# Patient Record
Sex: Male | Born: 1955 | Race: Black or African American | Hispanic: No | Smoking: Former smoker
Health system: Southern US, Community
[De-identification: ages and names within clinical notes are randomized; demographics above are authoritative.]

## PROBLEM LIST (undated history)

## (undated) DIAGNOSIS — K219 Gastro-esophageal reflux disease without esophagitis: Secondary | ICD-10-CM

## (undated) DIAGNOSIS — I10 Essential (primary) hypertension: Secondary | ICD-10-CM

## (undated) DIAGNOSIS — F32A Depression, unspecified: Secondary | ICD-10-CM

## (undated) DIAGNOSIS — M1990 Unspecified osteoarthritis, unspecified site: Secondary | ICD-10-CM

## (undated) DIAGNOSIS — E119 Type 2 diabetes mellitus without complications: Secondary | ICD-10-CM

## (undated) DIAGNOSIS — I351 Nonrheumatic aortic (valve) insufficiency: Secondary | ICD-10-CM

## (undated) DIAGNOSIS — G4730 Sleep apnea, unspecified: Secondary | ICD-10-CM

## (undated) DIAGNOSIS — I719 Aortic aneurysm of unspecified site, without rupture: Secondary | ICD-10-CM

## (undated) DIAGNOSIS — F419 Anxiety disorder, unspecified: Secondary | ICD-10-CM

## (undated) DIAGNOSIS — B1920 Unspecified viral hepatitis C without hepatic coma: Secondary | ICD-10-CM

## (undated) DIAGNOSIS — I2510 Atherosclerotic heart disease of native coronary artery without angina pectoris: Secondary | ICD-10-CM

## (undated) DIAGNOSIS — G5600 Carpal tunnel syndrome, unspecified upper limb: Secondary | ICD-10-CM

---

## 2010-10-17 ENCOUNTER — Emergency Department (HOSPITAL_COMMUNITY)
Admission: EM | Admit: 2010-10-17 | Discharge: 2010-10-17 | Payer: Self-pay | Source: Home / Self Care | Admitting: Emergency Medicine

## 2010-11-01 ENCOUNTER — Emergency Department (HOSPITAL_COMMUNITY)
Admission: EM | Admit: 2010-11-01 | Discharge: 2010-11-01 | Disposition: A | Discharge status: Home / Self Care | Payer: Self-pay | Attending: Emergency Medicine | Admitting: Emergency Medicine

## 2010-11-01 ENCOUNTER — Emergency Department (HOSPITAL_COMMUNITY): Payer: Self-pay

## 2010-11-01 DIAGNOSIS — I10 Essential (primary) hypertension: Secondary | ICD-10-CM | POA: Insufficient documentation

## 2010-11-01 DIAGNOSIS — M51379 Other intervertebral disc degeneration, lumbosacral region without mention of lumbar back pain or lower extremity pain: Secondary | ICD-10-CM | POA: Insufficient documentation

## 2010-11-01 DIAGNOSIS — M5430 Sciatica, unspecified side: Secondary | ICD-10-CM | POA: Insufficient documentation

## 2010-11-01 DIAGNOSIS — M545 Low back pain, unspecified: Secondary | ICD-10-CM | POA: Insufficient documentation

## 2010-11-01 DIAGNOSIS — R209 Unspecified disturbances of skin sensation: Secondary | ICD-10-CM | POA: Insufficient documentation

## 2010-11-01 DIAGNOSIS — M5137 Other intervertebral disc degeneration, lumbosacral region: Secondary | ICD-10-CM | POA: Insufficient documentation

## 2010-11-01 DIAGNOSIS — G8929 Other chronic pain: Secondary | ICD-10-CM | POA: Insufficient documentation

## 2011-01-10 ENCOUNTER — Emergency Department (HOSPITAL_COMMUNITY): Payer: Self-pay

## 2011-01-10 ENCOUNTER — Emergency Department (HOSPITAL_COMMUNITY)
Admission: EM | Admit: 2011-01-10 | Discharge: 2011-01-10 | Disposition: A | Discharge status: Home / Self Care | Payer: Self-pay | Attending: Emergency Medicine | Admitting: Emergency Medicine

## 2011-01-10 DIAGNOSIS — F411 Generalized anxiety disorder: Secondary | ICD-10-CM | POA: Insufficient documentation

## 2011-01-10 DIAGNOSIS — R110 Nausea: Secondary | ICD-10-CM | POA: Insufficient documentation

## 2011-01-10 LAB — POCT CARDIAC MARKERS
CKMB, poc: <1 ng/mL — ABNORMAL LOW (ref 1.0–8.0)
Troponin i, poc: <0.05 ng/mL (ref 0.00–0.09)

## 2011-01-10 LAB — POCT I-STAT, CHEM 8
Chloride: 103 mEq/L (ref 96–112)
HCT: 52 % (ref 39.0–52.0)
Potassium: 4.5 mEq/L (ref 3.5–5.1)
Sodium: 137 mEq/L (ref 135–145)

## 2011-11-30 ENCOUNTER — Emergency Department (HOSPITAL_COMMUNITY): Payer: Self-pay

## 2011-11-30 ENCOUNTER — Other Ambulatory Visit: Payer: Self-pay

## 2011-11-30 ENCOUNTER — Emergency Department (HOSPITAL_COMMUNITY)
Admission: EM | Admit: 2011-11-30 | Discharge: 2011-11-30 | Disposition: A | Discharge status: Home / Self Care | Payer: Self-pay | Attending: Emergency Medicine | Admitting: Emergency Medicine

## 2011-11-30 ENCOUNTER — Encounter (HOSPITAL_COMMUNITY): Payer: Self-pay | Admitting: *Deleted

## 2011-11-30 DIAGNOSIS — R062 Wheezing: Secondary | ICD-10-CM | POA: Insufficient documentation

## 2011-11-30 DIAGNOSIS — G4730 Sleep apnea, unspecified: Secondary | ICD-10-CM | POA: Insufficient documentation

## 2011-11-30 DIAGNOSIS — R1084 Generalized abdominal pain: Secondary | ICD-10-CM | POA: Insufficient documentation

## 2011-11-30 DIAGNOSIS — R319 Hematuria, unspecified: Secondary | ICD-10-CM | POA: Insufficient documentation

## 2011-11-30 DIAGNOSIS — R05 Cough: Secondary | ICD-10-CM | POA: Insufficient documentation

## 2011-11-30 DIAGNOSIS — I10 Essential (primary) hypertension: Secondary | ICD-10-CM | POA: Insufficient documentation

## 2011-11-30 DIAGNOSIS — F17200 Nicotine dependence, unspecified, uncomplicated: Secondary | ICD-10-CM | POA: Insufficient documentation

## 2011-11-30 DIAGNOSIS — H9319 Tinnitus, unspecified ear: Secondary | ICD-10-CM | POA: Insufficient documentation

## 2011-11-30 DIAGNOSIS — R059 Cough, unspecified: Secondary | ICD-10-CM | POA: Insufficient documentation

## 2011-11-30 DIAGNOSIS — R300 Dysuria: Secondary | ICD-10-CM | POA: Insufficient documentation

## 2011-11-30 DIAGNOSIS — R110 Nausea: Secondary | ICD-10-CM | POA: Insufficient documentation

## 2011-11-30 DIAGNOSIS — R42 Dizziness and giddiness: Secondary | ICD-10-CM | POA: Insufficient documentation

## 2011-11-30 DIAGNOSIS — K6289 Other specified diseases of anus and rectum: Secondary | ICD-10-CM | POA: Insufficient documentation

## 2011-11-30 DIAGNOSIS — K5900 Constipation, unspecified: Secondary | ICD-10-CM | POA: Insufficient documentation

## 2011-11-30 DIAGNOSIS — R0789 Other chest pain: Secondary | ICD-10-CM | POA: Insufficient documentation

## 2011-11-30 HISTORY — DX: Carpal tunnel syndrome, unspecified upper limb: G56.00

## 2011-11-30 HISTORY — DX: Sleep apnea, unspecified: G47.30

## 2011-11-30 LAB — COMPREHENSIVE METABOLIC PANEL
ALT: 48 U/L (ref 0–53)
Alkaline Phosphatase: 71 U/L (ref 39–117)
BUN: 13 mg/dL (ref 6–23)
CO2: 25 mEq/L (ref 19–32)
Calcium: 9.9 mg/dL (ref 8.4–10.5)
GFR calc Af Amer: >90 mL/min (ref >90)
GFR calc non Af Amer: >90 mL/min (ref >90)
Glucose, Bld: 103 mg/dL — ABNORMAL HIGH (ref 70–99)
Potassium: 4.1 mEq/L (ref 3.5–5.1)
Sodium: 137 mEq/L (ref 135–145)

## 2011-11-30 LAB — DIFFERENTIAL
Basophils Absolute: 0 10*3/uL (ref 0.0–0.1)
Basophils Relative: 0 % (ref 0–1)
Eosinophils Absolute: 0.1 10*3/uL (ref 0.0–0.7)
Monocytes Absolute: 0.8 10*3/uL (ref 0.1–1.0)
Neutro Abs: 3.1 10*3/uL (ref 1.7–7.7)
Neutrophils Relative %: 45 % (ref 43–77)

## 2011-11-30 LAB — URINALYSIS, ROUTINE W REFLEX MICROSCOPIC
Bilirubin Urine: NEGATIVE
Hgb urine dipstick: NEGATIVE
Protein, ur: NEGATIVE mg/dL
Urobilinogen, UA: 1 mg/dL (ref 0.0–1.0)

## 2011-11-30 LAB — CBC
HCT: 44.2 % (ref 39.0–52.0)
MCH: 31.7 pg (ref 26.0–34.0)
MCHC: 34.4 g/dL (ref 30.0–36.0)
RDW: 15.1 % (ref 11.5–15.5)

## 2011-11-30 LAB — D-DIMER, QUANTITATIVE: D-Dimer, Quant: <0.22 ug/mL-FEU (ref 0.00–0.48)

## 2011-11-30 MED ORDER — SODIUM CHLORIDE 0.9 % IV BOLUS (SEPSIS)
1000.0000 mL | Freq: Once | INTRAVENOUS | Status: AC
  Administered 2011-11-30: 1000 mL via INTRAVENOUS

## 2011-11-30 MED ORDER — POLYETHYLENE GLYCOL 3350 17 GM/SCOOP PO POWD: 17.0000 g | Freq: Every day | ORAL | Status: AC

## 2011-11-30 MED ORDER — SODIUM CHLORIDE 0.9 % IV SOLN
Freq: Once | INTRAVENOUS | Status: AC
  Administered 2011-11-30: 20:00:00 via INTRAVENOUS

## 2011-11-30 NOTE — ED Notes (Signed)
Pt alert & oriented x4, stable gait. Pt given discharge instructions, paperwork & prescription(s). Patient instructed to stop at the registration desk to finish any additional paperwork. pt verbalized understanding. Pt left department w/ no further questions.  

## 2011-11-30 NOTE — ED Notes (Signed)
Patient informed that all test results are back & awaiting the EDP to review the chart. No needs voiced at this time time. NAD noted.  

## 2011-11-30 NOTE — Discharge Instructions (Signed)
Chest Pain (Nonspecific) Chest pain has many causes. Your pain could be caused by something serious, such as a heart attack or a blood clot in the lungs. It could also be caused by something less serious, such as a chest bruise or a virus. Follow up with your doctor. More lab tests or other studies may be needed to find the cause of your pain. Most of the time, nonspecific chest pain will improve within 2 to 3 days of rest and mild pain medicine. HOME CARE  For chest bruises, you may put ice on the sore area for 15 to 20 minutes, 3 to 4 times a day. Do this only if it makes you or your child feel better.   Put ice in a plastic bag.   Place a towel between the skin and the bag.   Rest for the next 2 to 3 days.   Go back to work if the pain improves.   See your doctor if the pain lasts longer than 1 to 2 weeks.   Only take medicine as told by your doctor.   Quit smoking if you smoke.  GET HELP RIGHT AWAY IF:   There is more pain or pain that spreads to the arm, neck, jaw, back, or belly (abdomen).   You or your child has shortness of breath.   You or your child coughs more than usual or coughs up blood.   You or your child has very bad back or belly pain, feels sick to his or her stomach (nauseous), or throws up (vomits).   You or your child has very bad weakness.   You or your child passes out (faints).   You or your child has a temperature by mouth above 102 F (38.9 C), not controlled by medicine.  Any of these problems may be serious and may be an emergency. Do not wait to see if the problems will go away. Get medical help right away. Call your local emergency services 911 in [place] Do not drive yourself to the hospital. MAKE SURE YOU:   Understand these instructions.   Will watch this condition.   Will get help right away if you or your child is not doing well or gets worse.  Document Released: 02/29/2008 Document Revised: 09/01/2011 Document Reviewed:  02/29/2008 [facility] Patient Information 2012 [place], [place].Constipation in Adults Constipation is having fewer than 2 bowel movements per week. Usually, the stools are hard. As we grow older, constipation is more common. If you try to fix constipation with laxatives, the problem may get worse. This is because laxatives taken over a long period of time make the colon muscles weaker. A low-fiber diet, not taking in enough fluids, and taking some medicines may make these problems worse. MEDICATIONS THAT MAY CAUSE CONSTIPATION  Water pills (diuretics).   Calcium channel blockers (used to control blood pressure and for the heart).   Certain pain medicines (narcotics).   Anticholinergics.   Anti-inflammatory agents.   Antacids that contain aluminum.  DISEASES THAT CONTRIBUTE TO CONSTIPATION  Diabetes.   Parkinson's disease.   Dementia.   Stroke.   Depression.   Illnesses that cause problems with salt and water metabolism.  HOME CARE INSTRUCTIONS   Constipation is usually best cared for without medicines. Increasing dietary fiber and eating more fruits and vegetables is the best way to manage constipation.   Slowly increase fiber intake to 25 to 38 grams per day. Whole grains, fruits, vegetables, and legumes are good sources of fiber.  A dietitian can further help you incorporate high-fiber foods into your diet.   Drink enough water and fluids to keep your urine clear or pale yellow.   A fiber supplement may be added to your diet if you cannot get enough fiber from foods.   Increasing your activities also helps improve regularity.   Suppositories, as suggested by your caregiver, will also help. If you are using antacids, such as aluminum or calcium containing products, it will be helpful to switch to products containing magnesium if your caregiver says it is okay.   If you have been given a liquid injection (enema) today, this is only a temporary measure. It should not be relied  on for treatment of longstanding (chronic) constipation.   Stronger measures, such as magnesium sulfate, should be avoided if possible. This may cause uncontrollable diarrhea. Using magnesium sulfate may not allow you time to make it to the bathroom.  SEEK IMMEDIATE MEDICAL CARE IF:   There is bright red blood in the stool.   The constipation stays for more than 4 days.   There is belly (abdominal) or rectal pain.   You do not seem to be getting better.   You have any questions or concerns.  MAKE SURE YOU:   Understand these instructions.   Will watch your condition.   Will get help right away if you are not doing well or get worse.  Document Released: 06/10/2004 Document Revised: 09/01/2011 Document Reviewed: 08/16/2011 [facility] Patient Information 2012 [place], [place].

## 2011-11-30 NOTE — ED Notes (Signed)
States he thinks his has problems with his prostate

## 2011-11-30 NOTE — ED Provider Notes (Signed)
History  This chart was scribed for Loren Racer, MD by Bennett Scrape. This patient was seen in room APA03/APA03 and the patient's care was started at 6:45PM.  CSN: [id number]  Arrival date & time 11/30/11  1603   First MD Initiated Contact with Patient 11/30/11 1819      Chief Complaint  Patient presents with  . Dysuria    The history is provided by the patient. No language interpreter was used.    Kirk Oconnor is a 56 y.o. male who presents to the Emergency Department complaining of 3 weeks of gradual onset, non-changing, constant dizziness and nausea. Pt reports that the dizziness and nausea are worse with turning his head and bending his head down. He states occasional tinnitus in the right ear as an associated symptom. He has not tried any medications PTA to improve symptoms. He also c/o 2 months of constant generalized abdominal pain that radiates to his rectum pain that is worse with urination, intermittent hematuria, intermittent sharp chest pain and wheezing and coughing. He denies having a h/o diabetes but states that it does run in his family. He reports that he has not seen his PCP for the symptoms. He has a h/o HTN. Pt is a current smoker and alcohol user.   Past Medical History  Diagnosis Date  . Hypertension   . Sleep apnea   . Carpal tunnel syndrome     History reviewed. No pertinent past surgical history.  No family history on file.  History  Substance Use Topics  . Smoking status: Current Some Day Smoker  . Smokeless tobacco: Not on file  . Alcohol Use: Yes      Review of Systems  Constitutional: Negative for fever and chills.  HENT: Positive for tinnitus. Negative for congestion, sore throat and neck pain.   Eyes: Negative for pain.  Respiratory: Positive for cough. Negative for shortness of breath.   Cardiovascular: Positive for chest pain.  Gastrointestinal: Positive for nausea and abdominal pain. Negative for vomiting, diarrhea and  constipation.  Genitourinary: Positive for dysuria and hematuria.  Musculoskeletal: Negative for back pain.  Skin: Negative for rash.  Neurological: Positive for dizziness. Negative for numbness and headaches.  Psychiatric/Behavioral: Negative for confusion.    Allergies  Review of patient's allergies indicates no known allergies.  Home Medications   Current Outpatient Rx  Name Route Sig Dispense Refill  . POLYETHYLENE GLYCOL 3350 PO POWD Oral Take 17 g by mouth daily. 255 g 0    Triage Vitals: BP 158/96  Pulse 78  Temp(Src) 97.9 F (36.6 C) (Oral)  Resp 20  Ht 5\' 6"  (1.676 m)  Wt 165 lb 7 oz (75.042 kg)  BMI 26.70 kg/m2  SpO2 100%  Physical Exam  Nursing note and vitals reviewed. Constitutional: He is oriented to person, place, and time. He appears well-developed and well-nourished.  HENT:  Head: Normocephalic and atraumatic.       Right TM look opacified, no nystagmus  Eyes: EOM are normal. Pupils are equal, round, and reactive to light.  Neck: Normal range of motion. Neck supple.  Cardiovascular: Normal rate and regular rhythm.   Murmur: total systolic  Pulmonary/Chest: Effort normal and breath sounds normal. No respiratory distress.  Abdominal: Soft. He exhibits no distension. There is tenderness (generalized tenderness throughtout, no point tenderness). There is no rebound and no guarding.  Musculoskeletal: Normal range of motion. He exhibits no edema.  Neurological: He is alert and oriented to person, place, and time. No cranial  nerve deficit.  Skin: Skin is warm and dry.  Psychiatric: He has a normal mood and affect. His behavior is normal.    ED Course  Procedures (including critical care time)  DIAGNOSTIC STUDIES: Oxygen Saturation is 100% on room air, normal by my interpretation.    COORDINATION OF CARE: 6:52PM-Discussed treatment plan with pt and pt agreed to plan.   Labs Reviewed  COMPREHENSIVE METABOLIC PANEL - Abnormal; Notable for the following:     Glucose, Bld 103 (*)    All other components within normal limits  URINALYSIS, ROUTINE W REFLEX MICROSCOPIC - Abnormal; Notable for the following:    Color, Urine AMBER (*) BIOCHEMICALS MAY BE AFFECTED BY COLOR   All other components within normal limits  CBC  DIFFERENTIAL  LIPASE, BLOOD  CARDIAC PANEL(CRET KIN+CKTOT+MB+TROPI)  D-DIMER, QUANTITATIVE   Dg Abd Acute W/chest  11/30/2011  *RADIOLOGY REPORT*  Clinical Data: Abdominal pain.  ACUTE ABDOMEN SERIES (ABDOMEN 2 VIEW & CHEST 1 VIEW)  Comparison: Chest x-ray 01/10/2011  Findings: Cardiac enlargement.  Negative for heart failure.  Lungs are clear.  Negative for bowel obstruction.  Retained stool in the colon.  No dilated bowel loops or free air.  No acute bony abnormality.  IMPRESSION: No active cardiopulmonary disease.  No acute abnormality in the abdomen.  Original Report Authenticated By: Camelia Phenes, M.D.     1. Constipation   2. Atypical chest pain      Date: 11/30/2011  Rate: 72  Rhythm: normal sinus rhythm  QRS Axis: normal  Intervals: normal  ST/T Wave abnormalities: nonspecific T wave changes  Conduction Disutrbances:none  Narrative Interpretation:   Old EKG Reviewed: unchanged    MDM  Pt refuses to wait for second cardiac marker. Low likelihood this is cardiac related given history and negative workup thus far. He has been encouraged to return immediately for worsening pain or any concerns.       I personally performed the services described in this documentation, which was scribed in my presence. The recorded information has been reviewed and considered.     Loren Racer, MD 11/30/11 2153

## 2011-11-30 NOTE — ED Notes (Signed)
Family at bedside. Patient does not need anything at this time. 

## 2012-08-03 ENCOUNTER — Ambulatory Visit (HOSPITAL_COMMUNITY)
Admission: RE | Admit: 2012-08-03 | Discharge: 2012-08-03 | Disposition: A | Discharge status: Home / Self Care | Payer: Medicaid Other | Source: Ambulatory Visit | Attending: Physician Assistant | Admitting: Physician Assistant

## 2012-08-03 ENCOUNTER — Other Ambulatory Visit: Payer: Self-pay | Admitting: Physician Assistant

## 2012-08-03 DIAGNOSIS — R42 Dizziness and giddiness: Secondary | ICD-10-CM

## 2012-08-03 DIAGNOSIS — I10 Essential (primary) hypertension: Secondary | ICD-10-CM | POA: Insufficient documentation

## 2013-03-11 ENCOUNTER — Encounter: Payer: Self-pay | Admitting: Family Medicine

## 2013-03-11 ENCOUNTER — Ambulatory Visit (INDEPENDENT_AMBULATORY_CARE_PROVIDER_SITE_OTHER): Payer: Medicaid Other | Admitting: Family Medicine

## 2013-03-11 VITALS — BP 153/102 | HR 78 | Temp 97.4°F | Ht 67.5 in | Wt 161.0 lb

## 2013-03-11 DIAGNOSIS — R5381 Other malaise: Secondary | ICD-10-CM

## 2013-03-11 DIAGNOSIS — R5383 Other fatigue: Secondary | ICD-10-CM

## 2013-03-11 DIAGNOSIS — R0683 Snoring: Secondary | ICD-10-CM

## 2013-03-11 DIAGNOSIS — R0609 Other forms of dyspnea: Secondary | ICD-10-CM

## 2013-03-11 DIAGNOSIS — N529 Male erectile dysfunction, unspecified: Secondary | ICD-10-CM

## 2013-03-11 DIAGNOSIS — I10 Essential (primary) hypertension: Secondary | ICD-10-CM

## 2013-03-11 LAB — POCT CBC
Granulocyte percent: 42.1 %G (ref 37–80)
HCT, POC: 48.9 % (ref 43.5–53.7)
Hemoglobin: 16.4 g/dL (ref 14.1–18.1)
Lymph, poc: 3.2 (ref 0.6–3.4)
MCH, POC: 31.8 pg — AB (ref 27–31.2)
MCHC: 33.6 g/dL (ref 31.8–35.4)
MCV: 94.8 fL (ref 80–97)
MPV: 7.5 fL (ref 0–99.8)
POC Granulocyte: 2.7 (ref 2–6.9)
POC LYMPH PERCENT: 51.2 %L — AB (ref 10–50)
Platelet Count, POC: 160 10*3/uL (ref 142–424)
RBC: 5.2 M/uL (ref 4.69–6.13)
RDW, POC: 13.7 %
WBC: 6.3 10*3/uL (ref 4.6–10.2)

## 2013-03-11 LAB — THYROID PANEL WITH TSH
Free Thyroxine Index: 3.4 (ref 1.0–3.9)
T3 Uptake: 32.8 % (ref 22.5–37.0)
T4, Total: 10.3 ug/dL (ref 5.0–12.5)
TSH: 2.371 u[IU]/mL (ref 0.350–4.500)

## 2013-03-11 LAB — COMPLETE METABOLIC PANEL WITH GFR
ALT: 48 U/L (ref 0–53)
AST: 35 U/L (ref 0–37)
Albumin: 3.7 g/dL (ref 3.5–5.2)
Alkaline Phosphatase: 65 U/L (ref 39–117)
BUN: 11 mg/dL (ref 6–23)
CO2: 24 mEq/L (ref 19–32)
Calcium: 9.5 mg/dL (ref 8.4–10.5)
Chloride: 104 mEq/L (ref 96–112)
Creat: 0.98 mg/dL (ref 0.50–1.35)
GFR, Est African American: >89 mL/min
GFR, Est Non African American: 87 mL/min
Glucose, Bld: 98 mg/dL (ref 70–99)
Potassium: 4.4 mEq/L (ref 3.5–5.3)
Sodium: 136 mEq/L (ref 135–145)
Total Bilirubin: 0.7 mg/dL (ref 0.3–1.2)
Total Protein: 7 g/dL (ref 6.0–8.3)

## 2013-03-11 LAB — LIPID PANEL
Cholesterol: 122 mg/dL (ref 0–200)
HDL: 31 mg/dL — ABNORMAL LOW (ref >39)
LDL Cholesterol: 72 mg/dL (ref 0–99)
Total CHOL/HDL Ratio: 3.9 Ratio
Triglycerides: 93 mg/dL (ref <150)
VLDL: 19 mg/dL (ref 0–40)

## 2013-03-11 LAB — PSA: PSA: 1.68 ng/mL (ref <=4.00)

## 2013-03-11 MED ORDER — SILDENAFIL CITRATE 100 MG PO TABS: 50.0000 mg | ORAL_TABLET | Freq: Every day | ORAL | Status: DC | PRN

## 2013-03-11 MED ORDER — AMLODIPINE BESYLATE 5 MG PO TABS: 5.0000 mg | ORAL_TABLET | Freq: Every day | ORAL | Status: DC

## 2013-03-11 NOTE — Progress Notes (Signed)
  Subjective:    Patient ID: Kirk Oconnor, male    DOB: [birth year], 57 y.o.   MRN: [id number]  HPI This 57 y.o. male presents for evaluation of ED.  He describes having difficulty maintaining erection. He has had this problem over the last few years.  He describes low libido and excessive daytime sleepiness.  He states his girlfriend c/o he snores to loud. He takes naps during the day.  He c/o fatigue.  He has not had colonoscopy.  He has elevated blood pressure.  He is not on any medication.    Review of Systems  Constitutional: Positive for fatigue.  HENT: Negative.   Eyes: Negative.   Respiratory: Positive for choking.        C/o excessive snoring and choking at night.  Cardiovascular: Negative.   Gastrointestinal: Negative.   Endocrine: Negative.   Genitourinary:       Unable to maintain erection.  Psychiatric/Behavioral: Positive for sleep disturbance.       Objective:   Physical Exam Vital signs noted  Well developed well nourished male.  HEENT - Head atraumatic Normocephalic                Eyes - PERRLA, Conjuctiva - clear Sclera- Clear EOMI                Ears - EAC's Wnl TM's Wnl Gross Hearing WNL                Nose - Nares patent                 Throat - oropharanx wnl Respiratory - Lungs CTA bilateral Cardiac - RRR S1 and S2 without murmur GI - Abdomen soft Nontender and bowel sounds active x 4 Extremities - No edema. Neuro - Grossly intact.       Assessment & Plan:  Other malaise and fatigue - Plan: POCT CBC, COMPLETE METABOLIC PANEL WITH GFR, Thyroid Panel With TSH  Erectile dysfunction - Plan: Lipid panel, COMPLETE METABOLIC PANEL WITH GFR, Testosterone, Total & Free Direct, PSA, Thyroid Panel With TSH, sildenafil (VIAGRA) 100 MG tablet  Essential hypertension, benign - Plan: POCT CBC, amLODipine (NORVASC) 5 MG tablet  Snoring disorder - Plan: Nocturnal polysomnography (NPSG),  Testosterone, Total & Free Direct  Follow up in one month.

## 2013-03-11 NOTE — Patient Instructions (Signed)

## 2013-03-12 LAB — TESTOSTERONE, TOTAL AND FREE DIRECT MEASURE
Free Testosterone, Direct: 4.1 pg/mL (ref 3.8–34.2)
Testosterone: 691 ng/dL (ref 300–890)

## 2013-03-27 ENCOUNTER — Encounter: Payer: Self-pay | Admitting: Family Medicine

## 2013-03-27 ENCOUNTER — Ambulatory Visit (INDEPENDENT_AMBULATORY_CARE_PROVIDER_SITE_OTHER): Payer: Medicaid Other | Admitting: Family Medicine

## 2013-03-27 VITALS — BP 154/95 | HR 83 | Temp 97.3°F | Wt 161.0 lb

## 2013-03-27 DIAGNOSIS — G4733 Obstructive sleep apnea (adult) (pediatric): Secondary | ICD-10-CM

## 2013-03-27 DIAGNOSIS — R0989 Other specified symptoms and signs involving the circulatory and respiratory systems: Secondary | ICD-10-CM

## 2013-03-27 DIAGNOSIS — R0683 Snoring: Secondary | ICD-10-CM

## 2013-03-27 DIAGNOSIS — R0609 Other forms of dyspnea: Secondary | ICD-10-CM

## 2013-03-27 DIAGNOSIS — B1920 Unspecified viral hepatitis C without hepatic coma: Secondary | ICD-10-CM | POA: Insufficient documentation

## 2013-03-27 DIAGNOSIS — I10 Essential (primary) hypertension: Secondary | ICD-10-CM | POA: Insufficient documentation

## 2013-03-27 NOTE — Progress Notes (Signed)
  Subjective:    Patient ID: Kirk Oconnor, male    DOB: [birth year], 57 y.o.   MRN: [id number]  HPI  This 57 y.o. male presents for evaluation of follow up on his hypertension.  He states that he is awaiting a sleep study.  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  He also states he has hx of hepatitis C.  He states he was given pills in prison and told he had light case of hepatitis C.  He states he was not tx'd with interferon.  He has hx of ED and this is  Doing better with the viagra.  His bp is elevated.  He states he is taking the amlodipine 5mg  one half a day.  He is taking viagra 100mg  po qd.  Review of Systems    No chest pain, SOB, HA, dizziness, vision change, N/V, diarrhea, constipation, dysuria, urinary urgency or frequency, myalgias, arthralgias or rash.   Objective:   Physical Exam  Vital signs noted  Well developed well nourished male.  HEENT - Head atraumatic Normocephalic                Eyes - PERRLA, Conjuctiva - clear Sclera- Clear EOMI                Ears - EAC's Wnl TM's Wnl Gross Hearing WNL                Nose - Nares patent                 Throat - oropharanx wnl Respiratory - Lungs CTA bilateral Cardiac - RRR S1 and S2 without murmur GI - Abdomen soft Nontender and bowel sounds active x 4 Extremities - No edema. Neuro - Grossly intact.      Assessment & Plan:  Essential hypertension, benign - Plan: Cpap titration, Increase amlodipine to one whole pill qd.  Hepatitis C, without hepatic coma - Plan: Hepatitis C antibody, Hepatitis B surface antibody.  Not sure status of Hepatitis C but will order labs.  He may need referral to GI.  Snoring - Plan: Cpap titration, dc sleep study  Obstructive sleep apnea - Plan: Cpap titration, dc sleep study.  ED - Continue viagra.  Discussed that his testosterone was normal  Follow up in 2  weeks.

## 2013-03-28 ENCOUNTER — Other Ambulatory Visit: Payer: Self-pay | Admitting: Family Medicine

## 2013-03-28 DIAGNOSIS — R768 Other specified abnormal immunological findings in serum: Secondary | ICD-10-CM

## 2013-03-28 LAB — HEPATITIS B SURFACE ANTIBODY, QUANTITATIVE: Hepatitis B-Post: 17.1 m[IU]/mL

## 2013-03-28 LAB — HEPATITIS C ANTIBODY: HCV Ab: REACTIVE — AB

## 2013-04-03 ENCOUNTER — Telehealth: Payer: Self-pay | Admitting: Family Medicine

## 2013-04-03 NOTE — Telephone Encounter (Signed)
No samples patient aware. 

## 2013-04-04 NOTE — Telephone Encounter (Signed)
No samples at this time call back next week

## 2013-04-10 ENCOUNTER — Ambulatory Visit (INDEPENDENT_AMBULATORY_CARE_PROVIDER_SITE_OTHER): Payer: Medicaid Other | Admitting: Family Medicine

## 2013-04-10 ENCOUNTER — Ambulatory Visit: Payer: Medicaid Other | Admitting: Family Medicine

## 2013-04-10 ENCOUNTER — Encounter: Payer: Self-pay | Admitting: Family Medicine

## 2013-04-10 ENCOUNTER — Telehealth: Payer: Self-pay | Admitting: Family Medicine

## 2013-04-10 VITALS — BP 164/105 | HR 73 | Temp 97.7°F | Ht 67.5 in | Wt 162.8 lb

## 2013-04-10 DIAGNOSIS — B1920 Unspecified viral hepatitis C without hepatic coma: Secondary | ICD-10-CM

## 2013-04-10 DIAGNOSIS — I10 Essential (primary) hypertension: Secondary | ICD-10-CM

## 2013-04-10 MED ORDER — AMLODIPINE BESYLATE 10 MG PO TABS: 10.0000 mg | ORAL_TABLET | Freq: Every day | ORAL | Status: DC

## 2013-04-10 MED ORDER — AMLODIPINE BESYLATE 5 MG PO TABS: 10.0000 mg | ORAL_TABLET | Freq: Every day | ORAL | Status: DC

## 2013-04-10 NOTE — Patient Instructions (Addendum)
Hepatitis C °Hepatitis C is a viral infection of the liver. Infection may go undetected for months or years because symptoms may be absent or very mild. Chronic liver disease is the main danger of hepatitis C. This may lead to scarring of the liver (cirrhosis), liver failure, and liver cancer. °CAUSES  °Hepatitis C is caused by the hepatitis C virus (HCV). Formerly, hepatitis C infections were most commonly transmitted through blood transfusions. In the early 1990s, routine testing of donated blood for hepatitis C and exclusion of blood that tests positive for HCV began. Now, HCV is most commonly transmitted from person to person through injection drug use, sharing needles, or sex with an infected person. A caregiver may also get the infection from exposure to the blood of an infected patient by way of a cut or needle stick.  °SYMPTOMS  °Acute Phase °Many cases of acute HCV infection are mild and cause few problems. Some people may not even realize they are sick. Symptoms in others may last a few weeks to several months and include: °· Feeling very tired. °· Loss of appetite. °· Nausea. °· Vomiting. °· Abdominal pain. °· Dark yellow urine. °· Yellow skin and eyes (jaundice). °· Itching of the skin. °Chronic Phase °· Between 50% to 85% of people who get HCV infection become "chronic carriers." They often have no symptoms, but the virus stays in their body. They may spread the virus to others and can get long-term liver disease. °· Many people with chronic HCV infection remain healthy for many years. However, up to 1 in 5 chronically infected people may develop severe liver diseases including scarring of the liver (cirrhosis), liver failure, or liver cancer. °DIAGNOSIS  °Diagnosis of hepatitis C infection is made by testing blood for the presence of hepatitis C viral particles called RNA. Other tests may also be done to measure the status of current liver function, exclude other liver problems, or assess liver  damage. °TREATMENT  °Treatment with many antiviral drugs is available and recommended for some patients with chronic HCV infection. Drug treatment is generally considered appropriate for patients who: °· Are 18 years of age or older. °· Have a positive test for HCV particles in the blood. °· Have a liver tissue sample (biopsy) that shows chronic hepatitis and significant scarring (fibrosis). °· Do not have signs of liver failure. °· Have acceptable blood test results that confirm the wellness of other body organs. °· Are willing to be treated and conform to treatment requirements. °· Have no other circumstances that would prevent treatment from being recommended (contraindications). °All people who are offered and choose to receive drug treatment must understand that careful medical follow up for many months and even years is crucial in order to make successful care possible. The goal of drug treatment is to eliminate any evidence of HCV in the blood on a long-term basis. This is called a "sustained virologic response" or SVR. Achieving a SVR is associated with a decrease in the chance of life-threatening liver problems, need for a liver transplant, liver cancer rates, and liver-related complications. °Successful treatment currently requires taking treatment drugs for at least 24 weeks and up to 72 weeks. An injected drug (interferon) given weekly and an oral antiviral medicine taken daily are usually prescribed. Side effects from these drugs are common and some may be very serious. Your response to treatment must be carefully monitored by both you and your caregiver throughout the entire treatment period. °PREVENTION °There is no vaccine for hepatitis C. The only   way to prevent the disease is to reduce the risk of exposure to the virus.  °· Avoid sharing drug needles or personal items like toothbrushes, razors, and nail clippers with an infected person. °· Healthcare workers need to avoid injuries and wear  appropriate protective equipment such as gloves, gowns, and face masks when performing invasive medical or nursing procedures. °HOME CARE INSTRUCTIONS  °To avoid making your liver disease worse: °· Strictly avoid drinking alcohol. °· Carefully review all new prescriptions of medicines with your caregiver. Ask your caregiver which drugs you should avoid. The following drugs are toxic to the liver, and your caregiver may tell you to avoid them: °· Isoniazid. °· Methyldopa. °· Acetaminophen. °· Anabolic steroids (muscle-building drugs). °· Erythromycin. °· Oral contraceptives (birth control pills). °· Check with your caregiver to make sure medicine you are currently taking will not be harmful. °· Periodic blood tests may be required. Follow your caregiver's advice about when you should have blood tests. °· Avoid a sexual relationship until advised otherwise by your caregiver. °· Avoid activities that could expose other people to your blood. Examples include sharing a toothbrush, nail clippers, razors, and needles. °· Bed rest is not necessary, but it may make you feel better. Recovery time is not related to the amount of rest you receive. °· This infection is contagious. Follow your caregiver's instructions in order to avoid spread of the infection. °SEEK IMMEDIATE MEDICAL CARE IF: °· You have increasing fatigue or weakness. °· You have an oral temperature above 102° F (38.9° C), not controlled by medicine. °· You develop loss of appetite, nausea, or vomiting. °· You develop jaundice. °· You develop easy bruising or bleeding. °· You develop any severe problems as a result of your treatment. °MAKE SURE YOU:  °· Understand these instructions. °· Will watch your condition. °· Will get help right away if you are not doing well or get worse. °Document Released: 09/09/2000 Document Revised: 12/05/2011 Document Reviewed: 01/12/2011 °[employer]® Patient Information ©2014 [employer]. ° °

## 2013-04-10 NOTE — Progress Notes (Signed)
  Subjective:    Patient ID: Kirk Oconnor, male    DOB: [birth year], 57 y.o.   MRN: [id number]  HPI  This 57 y.o. male presents for evaluation of hx of hepatitis C and discussion.  He also has Been having uncontrolled hypertension, and he has ED and wants viagra samples.  Review of Systems    No chest pain, SOB, HA, dizziness, vision change, N/V, diarrhea, constipation, dysuria, urinary urgency or frequency, myalgias, arthralgias or rash.  Objective:   Physical Exam Vital signs noted  Well developed well nourished male.  HEENT - Head atraumatic Normocephalic                Eyes - PERRLA, Conjuctiva - clear Sclera- Clear EOMI                Ears - EAC's Wnl TM's Wnl Gross Hearing WNL                Nose - Nares patent                 Throat - oropharanx wnl Respiratory - Lungs CTA bilateral Cardiac - RRR S1 and S2 without murmur GI - Abdomen soft Nontender and bowel sounds active x 4 Extremities - No edema. Neuro - Grossly intact.       Assessment & Plan:  Essential hypertension, benign - Plan: amLODipine (NORVASC) 10 MG tablet, DISCONTINUED: amLODipine (NORVASC) 5 MG tablet Follow up in one month for BP check.  Hepatitis C, without hepatic coma - Plan: Ambulatory referral to Infectious Disease - Will need to get Records from his prison where he states he took some pills for this. Discussed with him that we will Need to call him at home and he should be expecting a phone call so we can facilitate his care or Treatment.  ED - no viagra samples and continue with current rx.  Follow up in one month.

## 2013-04-10 NOTE — Telephone Encounter (Signed)
?   Referral - Bill O to address

## 2013-04-11 NOTE — Telephone Encounter (Signed)
We are going to call him with a referral to Infectious Disease for his hepatitis.

## 2013-04-23 ENCOUNTER — Other Ambulatory Visit: Payer: Self-pay

## 2013-04-23 DIAGNOSIS — R768 Other specified abnormal immunological findings in serum: Secondary | ICD-10-CM

## 2013-07-11 ENCOUNTER — Encounter: Payer: Self-pay | Admitting: Family Medicine

## 2013-07-11 ENCOUNTER — Encounter (INDEPENDENT_AMBULATORY_CARE_PROVIDER_SITE_OTHER): Payer: Self-pay

## 2013-07-11 ENCOUNTER — Ambulatory Visit (INDEPENDENT_AMBULATORY_CARE_PROVIDER_SITE_OTHER): Payer: Medicaid Other | Admitting: Family Medicine

## 2013-07-11 VITALS — BP 178/101 | HR 76 | Temp 97.0°F | Ht 67.5 in | Wt 159.8 lb

## 2013-07-11 DIAGNOSIS — R079 Chest pain, unspecified: Secondary | ICD-10-CM

## 2013-07-11 DIAGNOSIS — I10 Essential (primary) hypertension: Secondary | ICD-10-CM

## 2013-07-11 DIAGNOSIS — F3289 Other specified depressive episodes: Secondary | ICD-10-CM

## 2013-07-11 DIAGNOSIS — R0602 Shortness of breath: Secondary | ICD-10-CM

## 2013-07-11 DIAGNOSIS — R350 Frequency of micturition: Secondary | ICD-10-CM

## 2013-07-11 DIAGNOSIS — F329 Major depressive disorder, single episode, unspecified: Secondary | ICD-10-CM

## 2013-07-11 MED ORDER — CIPROFLOXACIN HCL 500 MG PO TABS: 500.0000 mg | ORAL_TABLET | Freq: Two times a day (BID) | ORAL | Status: DC

## 2013-07-11 MED ORDER — TRIAMTERENE-HCTZ 37.5-25 MG PO TABS: 1.0000 | ORAL_TABLET | Freq: Every day | ORAL | Status: DC

## 2013-07-11 MED ORDER — SERTRALINE HCL 50 MG PO TABS: 50.0000 mg | ORAL_TABLET | Freq: Every day | ORAL | Status: DC

## 2013-07-11 NOTE — Progress Notes (Signed)
  Subjective:    Patient ID: Kirk Oconnor, male    DOB: 09/15/1956, 57 y.o.   MRN: [id number]  HPI This 57 y.o. male presents for evaluation of hypertension.  He has been under a lot Grief and stress.  His daughter was killed in a MVA recently and he has been feeling Bad lately.  He has been having numbness and tingling in his hands and feet. He has been experiencing some nocturia.  Review of Systems C/o numbness and tingling in his hands and feet and nocturia. No chest pain, SOB, HA, dizziness, vision change, N/V, diarrhea, constipation, dysuria, urinary urgency or frequency, myalgias, arthralgias or rash.     Objective:   Physical Exam Vital signs noted  Well developed well nourished male.  HEENT - Head atraumatic Normocephalic                Eyes - PERRLA, Conjuctiva - clear Sclera- Clear EOMI                Ears - EAC's Wnl TM's Wnl Gross Hearing WNL                Nose - Nares patent                 Throat - oropharanx wnl Respiratory - Lungs CTA bilateral Cardiac - RRR S1 and S2 without murmur GI - Abdomen soft Nontender and bowel sounds active x 4 Extremities - No edema. Neuro - Grossly intact.  EKG - NSR with ST flipped in inferior leads and flat st segment in V1 and ST elevation in v2 v3 And LVH.     Assessment & Plan:  Unspecified essential hypertension - Plan: triamterene-hydrochlorothiazide (MAXZIDE-25) 37.5-25 MG per tablet, Ambulatory referral to Cardiology, EKG 12-Lead, EKG 12-Lead Added maxide one po qd.  Need to follow up in 2 weeks.  Shortness of breath - Referral to Cardiology  Murmur - Referral to Cardiology.  Depression - Plan: sertraline (ZOLOFT) 50 MG tablet  Chest pain- EKG abnormal and concerned about patient having acute coronary syndrome and have talked at length with patient and encouraged him to go to ED and via ambulance [facility] and he refuses.  He signs AMA form.    Urinary frequency-Cipro 500mg  one po bid x 2weeks #28  Deatra Canter FNP

## 2013-07-24 ENCOUNTER — Ambulatory Visit: Payer: Medicaid Other | Admitting: Cardiology

## 2013-07-26 ENCOUNTER — Ambulatory Visit: Payer: Medicaid Other | Admitting: Cardiology

## 2013-08-06 ENCOUNTER — Ambulatory Visit: Payer: Medicaid Other | Admitting: Cardiovascular Disease

## 2013-08-26 ENCOUNTER — Encounter: Payer: Self-pay | Admitting: Cardiovascular Disease

## 2013-08-26 ENCOUNTER — Ambulatory Visit: Payer: Medicaid Other | Admitting: Cardiovascular Disease

## 2016-10-21 ENCOUNTER — Observation Stay (HOSPITAL_COMMUNITY): Payer: Medicaid Other

## 2016-10-21 ENCOUNTER — Ambulatory Visit (INDEPENDENT_AMBULATORY_CARE_PROVIDER_SITE_OTHER): Payer: Medicaid Other | Admitting: Family Medicine

## 2016-10-21 ENCOUNTER — Inpatient Hospital Stay (HOSPITAL_COMMUNITY)
Admission: EM | Admit: 2016-10-21 | Discharge: 2016-10-23 | DRG: 193 | Disposition: A | Discharge status: Home / Self Care | Payer: Medicaid Other | Attending: Internal Medicine | Admitting: Internal Medicine

## 2016-10-21 ENCOUNTER — Emergency Department (HOSPITAL_COMMUNITY): Payer: Medicaid Other

## 2016-10-21 ENCOUNTER — Encounter: Payer: Self-pay | Admitting: Family Medicine

## 2016-10-21 ENCOUNTER — Observation Stay (HOSPITAL_BASED_OUTPATIENT_CLINIC_OR_DEPARTMENT_OTHER): Payer: Medicaid Other

## 2016-10-21 ENCOUNTER — Encounter (HOSPITAL_COMMUNITY): Payer: Self-pay | Admitting: Emergency Medicine

## 2016-10-21 VITALS — BP 142/78 | HR 100 | Temp 97.6°F

## 2016-10-21 DIAGNOSIS — G4733 Obstructive sleep apnea (adult) (pediatric): Secondary | ICD-10-CM | POA: Diagnosis present

## 2016-10-21 DIAGNOSIS — I5031 Acute diastolic (congestive) heart failure: Secondary | ICD-10-CM | POA: Diagnosis present

## 2016-10-21 DIAGNOSIS — I10 Essential (primary) hypertension: Secondary | ICD-10-CM

## 2016-10-21 DIAGNOSIS — R011 Cardiac murmur, unspecified: Secondary | ICD-10-CM | POA: Diagnosis present

## 2016-10-21 DIAGNOSIS — J189 Pneumonia, unspecified organism: Principal | ICD-10-CM | POA: Diagnosis present

## 2016-10-21 DIAGNOSIS — Z8249 Family history of ischemic heart disease and other diseases of the circulatory system: Secondary | ICD-10-CM

## 2016-10-21 DIAGNOSIS — N401 Enlarged prostate with lower urinary tract symptoms: Secondary | ICD-10-CM | POA: Diagnosis not present

## 2016-10-21 DIAGNOSIS — Z9114 Patient's other noncompliance with medication regimen: Secondary | ICD-10-CM

## 2016-10-21 DIAGNOSIS — Z79899 Other long term (current) drug therapy: Secondary | ICD-10-CM

## 2016-10-21 DIAGNOSIS — R0600 Dyspnea, unspecified: Secondary | ICD-10-CM

## 2016-10-21 DIAGNOSIS — I712 Thoracic aortic aneurysm, without rupture: Secondary | ICD-10-CM | POA: Diagnosis not present

## 2016-10-21 DIAGNOSIS — I77810 Thoracic aortic ectasia: Secondary | ICD-10-CM | POA: Diagnosis present

## 2016-10-21 DIAGNOSIS — R0789 Other chest pain: Secondary | ICD-10-CM | POA: Diagnosis not present

## 2016-10-21 DIAGNOSIS — I252 Old myocardial infarction: Secondary | ICD-10-CM

## 2016-10-21 DIAGNOSIS — Q2543 Congenital aneurysm of aorta: Secondary | ICD-10-CM

## 2016-10-21 DIAGNOSIS — B1920 Unspecified viral hepatitis C without hepatic coma: Secondary | ICD-10-CM | POA: Diagnosis present

## 2016-10-21 DIAGNOSIS — R0602 Shortness of breath: Secondary | ICD-10-CM

## 2016-10-21 DIAGNOSIS — N400 Enlarged prostate without lower urinary tract symptoms: Secondary | ICD-10-CM | POA: Diagnosis present

## 2016-10-21 DIAGNOSIS — R600 Localized edema: Secondary | ICD-10-CM | POA: Diagnosis not present

## 2016-10-21 DIAGNOSIS — I110 Hypertensive heart disease with heart failure: Secondary | ICD-10-CM | POA: Diagnosis present

## 2016-10-21 DIAGNOSIS — Z833 Family history of diabetes mellitus: Secondary | ICD-10-CM

## 2016-10-21 DIAGNOSIS — R079 Chest pain, unspecified: Secondary | ICD-10-CM | POA: Diagnosis not present

## 2016-10-21 DIAGNOSIS — I719 Aortic aneurysm of unspecified site, without rupture: Secondary | ICD-10-CM | POA: Diagnosis not present

## 2016-10-21 DIAGNOSIS — Z87891 Personal history of nicotine dependence: Secondary | ICD-10-CM

## 2016-10-21 DIAGNOSIS — K219 Gastro-esophageal reflux disease without esophagitis: Secondary | ICD-10-CM | POA: Diagnosis present

## 2016-10-21 LAB — ECHOCARDIOGRAM COMPLETE
AV Area VTI: 1.93 cm2
AV Mean grad: 20 mmHg
AV peak Index: 1.03
AV vel: 1.99
AVA: 1.99 cm2
AVAREAMEANV: 2.01 cm2
AVAREAMEANVIN: 1.08 cm2/m2
AVAREAVTIIND: 1.07 cm2/m2
AVCELMEANRAT: 0.45
AVLVOTPG: 7 mmHg
AVPG: 37 mmHg
AVPKVEL: 305 cm/s
Ao pk vel: 0.43 m/s
CHL CUP AV VALUE AREA INDEX: 1.07
CHL CUP MV DEC (S): 225
DOP CAL AO MEAN VELOCITY: 204 cm/s
E decel time: 225 msec
EERAT: 11.95
FS: 24 % — AB (ref 28–44)
IV/PV OW: 0.98
LA ID, A-P, ES: 30 mm
LA diam index: 1.61 cm/m2
LA vol A4C: 35.4 ml
LA vol index: 24.2 mL/m2
LA vol: 45.2 mL
LDCA: 4.52 cm2
LEFT ATRIUM END SYS DIAM: 30 mm
LV E/e' medial: 11.95
LV E/e'average: 11.95
LV PW d: 15 mm — AB (ref 0.6–1.1)
LV SIMPSON'S DISK: 54
LV dias vol index: 88 mL/m2
LV dias vol: 165 mL — AB (ref 62–150)
LV sys vol index: 41 mL/m2
LVELAT: 5.44 cm/s
LVOT SV: 127 mL
LVOT VTI: 28.2 cm
LVOT peak VTI: 0.44 cm
LVOTD: 24 mm
LVOTPV: 130 cm/s
LVSYSVOL: 76 mL — AB (ref 21–61)
MVPKAVEL: 89.2 m/s
MVPKEVEL: 65 m/s
P 1/2 time: 378 ms
RV LATERAL S' VELOCITY: 12 cm/s
Stroke v: 89 ml
TAPSE: 18.8 mm
TDI e' lateral: 5.44
TDI e' medial: 4.03
VTI: 63.9 cm

## 2016-10-21 LAB — I-STAT TROPONIN, ED: TROPONIN I, POC: 0.01 ng/mL (ref 0.00–0.08)

## 2016-10-21 LAB — BASIC METABOLIC PANEL
ANION GAP: 7 (ref 5–15)
BUN: 9 mg/dL (ref 6–20)
CALCIUM: 8.5 mg/dL — AB (ref 8.9–10.3)
CO2: 21 mmol/L — AB (ref 22–32)
Chloride: 102 mmol/L (ref 101–111)
Creatinine, Ser: 1.55 mg/dL — ABNORMAL HIGH (ref 0.61–1.24)
GFR calc non Af Amer: 48 mL/min — ABNORMAL LOW (ref >60)
GFR, EST AFRICAN AMERICAN: 55 mL/min — AB (ref >60)
Glucose, Bld: 152 mg/dL — ABNORMAL HIGH (ref 65–99)
Potassium: 3.4 mmol/L — ABNORMAL LOW (ref 3.5–5.1)
SODIUM: 130 mmol/L — AB (ref 135–145)

## 2016-10-21 LAB — CBC
HCT: 40.9 % (ref 39.0–52.0)
HEMOGLOBIN: 13.9 g/dL (ref 13.0–17.0)
MCH: 30.8 pg (ref 26.0–34.0)
MCHC: 34 g/dL (ref 30.0–36.0)
MCV: 90.7 fL (ref 78.0–100.0)
PLATELETS: 170 10*3/uL (ref 150–400)
RBC: 4.51 MIL/uL (ref 4.22–5.81)
RDW: 17.4 % — ABNORMAL HIGH (ref 11.5–15.5)
WBC: 6.5 10*3/uL (ref 4.0–10.5)

## 2016-10-21 LAB — MAGNESIUM: MAGNESIUM: 1.9 mg/dL (ref 1.7–2.4)

## 2016-10-21 LAB — TROPONIN I

## 2016-10-21 LAB — BRAIN NATRIURETIC PEPTIDE: B Natriuretic Peptide: 319 pg/mL — ABNORMAL HIGH (ref 0.0–100.0)

## 2016-10-21 LAB — TSH: TSH: 1.653 u[IU]/mL (ref 0.350–4.500)

## 2016-10-21 MED ORDER — HYDROCODONE-ACETAMINOPHEN 5-325 MG PO TABS
2.0000 | ORAL_TABLET | Freq: Once | ORAL | Status: AC
  Administered 2016-10-21: 2 via ORAL
  Filled 2016-10-21: qty 2

## 2016-10-21 MED ORDER — ACETAMINOPHEN 325 MG PO TABS: 650.0000 mg | ORAL_TABLET | Freq: Four times a day (QID) | ORAL | Status: DC | PRN

## 2016-10-21 MED ORDER — FUROSEMIDE 10 MG/ML IJ SOLN
20.0000 mg | Freq: Once | INTRAMUSCULAR | Status: AC
  Administered 2016-10-21: 20 mg via INTRAVENOUS
  Filled 2016-10-21: qty 2

## 2016-10-21 MED ORDER — SODIUM CHLORIDE 0.9 % IV SOLN: 250.0000 mL | INTRAVENOUS | Status: DC | PRN

## 2016-10-21 MED ORDER — POTASSIUM CHLORIDE CRYS ER 20 MEQ PO TBCR
40.0000 meq | EXTENDED_RELEASE_TABLET | Freq: Once | ORAL | Status: AC
  Administered 2016-10-21: 40 meq via ORAL
  Filled 2016-10-21: qty 2

## 2016-10-21 MED ORDER — IOPAMIDOL (ISOVUE-370) INJECTION 76%
100.0000 mL | Freq: Once | INTRAVENOUS | Status: AC | PRN
  Administered 2016-10-21: 100 mL via INTRAVENOUS

## 2016-10-21 MED ORDER — ONDANSETRON HCL 4 MG PO TABS
4.0000 mg | ORAL_TABLET | Freq: Once | ORAL | Status: AC
  Administered 2016-10-21: 4 mg via ORAL
  Filled 2016-10-21: qty 1

## 2016-10-21 MED ORDER — NITROGLYCERIN 0.4 MG SL SUBL
0.4000 mg | SUBLINGUAL_TABLET | Freq: Once | SUBLINGUAL | Status: AC
  Administered 2016-10-21: 0.4 mg via SUBLINGUAL

## 2016-10-21 MED ORDER — ASPIRIN EC 81 MG PO TBEC: 81.0000 mg | DELAYED_RELEASE_TABLET | Freq: Every day | ORAL | Status: DC

## 2016-10-21 MED ORDER — HYDROCODONE-ACETAMINOPHEN 5-325 MG PO TABS
1.0000 | ORAL_TABLET | ORAL | Status: DC | PRN
  Administered 2016-10-22 – 2016-10-23 (×4): 2 via ORAL
  Filled 2016-10-21 (×4): qty 2

## 2016-10-21 MED ORDER — ONDANSETRON HCL 4 MG/2ML IJ SOLN: 4.0000 mg | Freq: Four times a day (QID) | INTRAMUSCULAR | Status: DC | PRN

## 2016-10-21 MED ORDER — FUROSEMIDE 10 MG/ML IJ SOLN
40.0000 mg | Freq: Two times a day (BID) | INTRAMUSCULAR | Status: DC
  Administered 2016-10-21 – 2016-10-22 (×2): 40 mg via INTRAVENOUS
  Filled 2016-10-21 (×3): qty 4

## 2016-10-21 MED ORDER — METOPROLOL TARTRATE 25 MG PO TABS
25.0000 mg | ORAL_TABLET | Freq: Two times a day (BID) | ORAL | Status: DC
  Administered 2016-10-21 – 2016-10-23 (×3): 25 mg via ORAL
  Filled 2016-10-21 (×5): qty 1

## 2016-10-21 MED ORDER — ACETAMINOPHEN 650 MG RE SUPP: 650.0000 mg | Freq: Four times a day (QID) | RECTAL | Status: DC | PRN

## 2016-10-21 MED ORDER — SODIUM CHLORIDE 0.9% FLUSH
3.0000 mL | Freq: Two times a day (BID) | INTRAVENOUS | Status: DC
  Administered 2016-10-21 – 2016-10-23 (×3): 3 mL via INTRAVENOUS

## 2016-10-21 MED ORDER — ASPIRIN EC 81 MG PO TBEC
81.0000 mg | DELAYED_RELEASE_TABLET | Freq: Every day | ORAL | Status: DC
  Administered 2016-10-22 – 2016-10-23 (×2): 81 mg via ORAL
  Filled 2016-10-21 (×2): qty 1

## 2016-10-21 MED ORDER — ALUM & MAG HYDROXIDE-SIMETH 200-200-20 MG/5ML PO SUSP
30.0000 mL | ORAL | Status: DC | PRN
  Administered 2016-10-21 – 2016-10-22 (×3): 30 mL via ORAL
  Filled 2016-10-21 (×3): qty 30

## 2016-10-21 MED ORDER — SODIUM CHLORIDE 0.9% FLUSH
3.0000 mL | INTRAVENOUS | Status: DC | PRN
  Administered 2016-10-22: 3 mL via INTRAVENOUS
  Filled 2016-10-21: qty 3

## 2016-10-21 MED ORDER — ONDANSETRON HCL 4 MG PO TABS: 4.0000 mg | ORAL_TABLET | Freq: Four times a day (QID) | ORAL | Status: DC | PRN

## 2016-10-21 NOTE — ED Triage Notes (Addendum)
Pt came to ED by EMS from Dr's office.  Pt not sure of name of facility or MD.  Having chest pain for one day and half.  C/o pain to left chest, non-radiating with SOB, 10/10.  History of MI 40.  Pt given NTG x2 and 324 mg ASA via EMS.

## 2016-10-21 NOTE — ED Notes (Signed)
Pt has eaten all of meal save cookies and roll- He elects to save cookies for later

## 2016-10-21 NOTE — ED Provider Notes (Signed)
[facility]-EM[facility]ENCY DEPT Provider Note   CSN: [id number] Arrival date & time: 10/21/16  4503     History   Chief Complaint Chief Complaint  Patient presents with  . Chest Pain    for 1 day and half    HPI Kirk Oconnor is a 61 y.o. male.  Patient is a 61 year old male who presents to the emergency department with a complaint of shortness of breath and chest pain.  Patient has a history of essential hypertension, myocardial infarction 2015, hepatitis C.  Patient was brought to the emergency department today by EMS from his physician office. The patient states he's had 2 and half days of chest pain. He's had some shortness of breath off and on for some time, but this too has gotten worse. He states that he has to sit up in a chair on to breathe comfortably. He states that walking short distances make him short of breath, and he states at times even having a conversation can cause him to be short of breath. He describes the chest pain as a cramp type pain in his left upper chest. He states the pain lasted about 5-10 minutes usually, and comes and goes. Today the pain seemed to stay longer than usual he became concerned and came to the emergency department. This pain was accompanied by some sweating. There was no loss of consciousness no vomiting no change in mental status no change in neurologic status. The patient denies smoking cigarettes, drinking alcohol, or using recreational drugs. He presents to the emergency department at this time for additional evaluation.      Past Medical History:  Diagnosis Date  . Carpal tunnel syndrome   . Hypertension   . Sleep apnea     Patient Active Problem List   Diagnosis Date Noted  . Essential hypertension, benign 03/27/2013  . Hepatitis C 03/27/2013  . Snoring 03/27/2013    History reviewed. No pertinent surgical history.     Home Medications    Prior to Admission medications   Medication Sig Start Date End Date Taking? Authorizing  Provider  amLODipine (NORVASC) 10 MG tablet Take 1 tablet (10 mg total) by mouth daily. 04/10/13   Deatra Canter, FNP  sertraline (ZOLOFT) 50 MG tablet Take 1 tablet (50 mg total) by mouth daily. 07/11/13   Deatra Canter, FNP  sildenafil (VIAGRA) 100 MG tablet Take 0.5-1 tablets (50-100 mg total) by mouth daily as needed for erectile dysfunction. Patient not taking: Reported on 10/21/2016 03/11/13   Deatra Canter, FNP  triamterene-hydrochlorothiazide (MAXZIDE-25) 37.5-25 MG per tablet Take 1 tablet by mouth daily. 07/11/13   Deatra Canter, FNP    Family History Family History  Problem Relation Age of Onset  . Diabetes Mother   . Heart attack Father     Social History Social History  Substance Use Topics  . Smoking status: Former Games developer  . Smokeless tobacco: Never Used  . Alcohol use No     Allergies   Patient has no known allergies.   Review of Systems Review of Systems  Constitutional: Positive for activity change and fatigue.       All ROS Neg except as noted in HPI  HENT: Negative for nosebleeds.   Eyes: Negative for photophobia and discharge.  Respiratory: Positive for shortness of breath. Negative for cough and wheezing.   Cardiovascular: Positive for chest pain and leg swelling. Negative for palpitations.  Gastrointestinal: Negative for abdominal pain and blood in stool.  Genitourinary: Negative  for dysuria, frequency and hematuria.  Musculoskeletal: Negative for arthralgias, back pain and neck pain.  Skin: Negative.   Neurological: Negative for dizziness, seizures and speech difficulty.  Psychiatric/Behavioral: Negative for confusion and hallucinations.     Physical Exam Updated Vital Signs BP 137/80 (BP Location: Left Arm)   Pulse 85   Temp 97.8 F (36.6 C) (Oral)   Resp 22   SpO2 96%   Physical Exam  Constitutional: He is oriented to person, place, and time. He appears well-developed and well-nourished.  Non-toxic appearance.  HENT:  Head:  Normocephalic.  Right Ear: Tympanic membrane and external ear normal.  Left Ear: Tympanic membrane and external ear normal.  Eyes: EOM and lids are normal. Pupils are equal, round, and reactive to light.  Neck: Normal range of motion. Neck supple. Carotid bruit is not present.  Cardiovascular: Normal rate, regular rhythm, intact distal pulses and normal pulses.   Murmur heard.  Systolic murmur is present with a grade of 2/6  Pulmonary/Chest: Breath sounds normal. No respiratory distress.  Abdominal: Soft. Bowel sounds are normal. There is no tenderness. There is no guarding.  Musculoskeletal: Normal range of motion. He exhibits edema.  1-2+ pitting edema of the lower extremities.  Lymphadenopathy:       Head (right side): No submandibular adenopathy present.       Head (left side): No submandibular adenopathy present.    He has no cervical adenopathy.  Neurological: He is alert and oriented to person, place, and time. He has normal strength. No cranial nerve deficit or sensory deficit.  Skin: Skin is warm and dry.  Psychiatric: He has a normal mood and affect. His speech is normal.  Nursing note and vitals reviewed.    ED Treatments / Results  Labs (all labs ordered are listed, but only abnormal results are displayed) Labs Reviewed  BASIC METABOLIC PANEL  CBC  I-STAT TROPOININ, ED    EKG  EKG Interpretation  Date/Time:  Friday October 21 2016 09:14:48 EST Ventricular Rate:  85 PR Interval:    QRS Duration: 117 QT Interval:  389 QTC Calculation: 463 R Axis:   -63 Text Interpretation:  Sinus rhythm Atrial premature complex Incomplete RBBB and LAFB Left ventricular hypertrophy ST elevation, consider anterior injury Baseline wander in lead(s) I II aVR Confirmed by Adriana Simas  MD, BRIAN ([id number]) on 10/21/2016 9:24:43 AM       Radiology No results found.  Procedures Procedures (including critical care time)  Medications Ordered in ED Medications - No data to  display   Initial Impression / Assessment and Plan / ED Course  I have reviewed the triage vital signs and the nursing notes.  Pertinent labs & imaging results that were available during my care of the patient were reviewed by me and considered in my medical decision making (see chart for details).     **I have reviewed nursing notes, vital signs, and all appropriate lab and imaging results for this patient.*  Final Clinical Impressions(s) / ED Diagnoses  MDM Pulse oximetry is 95-96% on 2 L of oxygen. Vital signs within normal limits. Patient does not appear to be in acute distress, but states he is still having some pain even after receiving the nitroglycerin and aspirin by EMS. Patient was given to Norco tablets by mouth.  The troponin is negative for acute event. The basic metabolic panel shows the sodium to be slightly low at 1:30, the potassium at 3.4, and the CO2 low at 21. The glucose is elevated  at 152, creatinine is elevated 1.55. This is higher than his previous testings at this institution. Complete blood count is well within normal limits. The B natruretic peptide is slightly elevated at 319. The chest x-ray shows a new cardiomegaly with stable aortic ectasia. There is no acute process identified at this time. Is no pleural effusion and no pneumothorax noted.  Patient seen with me by Dr. Adriana Simas.  I discussed the case with Dr. Wyline Mood (cardiology), he will come to the emergency department to see the patient on consultation.  Case discussed with [facility] hospitalist. Patient to be admitted to observation for now.    Final diagnoses:  Chest pain, unspecified type  Shortness of breath on exertion    New Prescriptions New Prescriptions   No medications on file     Ivery Quale, PA-C 10/21/16 1429    Donnetta Hutching, MD 10/24/16 1714

## 2016-10-21 NOTE — Progress Notes (Signed)
   HPI  Patient presents today here with chest pain.  Patient explains that he's had shortness of breath for about one week, he began having left-sided chest pain about 1-1/2 days ago. He states that when the chest pain comes on the dyspnea is worse. It is not exertional.  He states he's had sweats recently, however no fevers. He does have a cough. He denies smoking.  He is a history of hypertension, hepatitis C. He also states he has a history of MI.  She was given nitroglycerin with incomplete improvement of pain.  PMH: Smoking status noted ROS: Per HPI  Objective: BP (!) 142/78 (BP Location: Left Arm, Patient Position: Sitting, Cuff Size: Normal)   Pulse 100   Temp 97.6 F (36.4 C) (Oral)   SpO2 96%  Gen: NAD, alert, cooperative with exam HEENT: NCAT CV: RRR, good S1/S2, no murmur Chest wall: Severe tenderness to palpation of the left chest, however patient states this is not true reproduction of pain. Resp: CTABL, no wheezes, non-labored Abd: SNTND, BS present, no guarding or organomegaly Ext: No edema, warm Neuro: Alert and oriented, No gross deficits  EKG - No acute findings, Previously incomplete RBBB, Now with L turn at J point with slightly wide QRS (concern for developing/evolving LBBB)  Assessment and plan:  # Chest pain Left-sided chest pain with dyspnea. Considering patient's comorbidities of hypertension, previous heart attack(pt reported this am), POssible EKG evolving to LBBB, and dyspnea I am recommending evaluation the emergency room to rule out ACS.  Note that his chest wall is very tender to palpation, however patient states this is not a true reproduction of pain.   Orders Placed This Encounter  Procedures  . EKG 12-Lead     [name], MD [facility] 10/21/2016, 8:29 AM

## 2016-10-21 NOTE — ED Notes (Signed)
Report to Val, RN

## 2016-10-21 NOTE — ED Notes (Signed)
Call for report to Val, RN  She will call back

## 2016-10-21 NOTE — Progress Notes (Signed)
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 Though his pain is reproducible on exam and likely muskuloskeletal, given chest pain and aortic aneurysm to this degree will order CTA to rule out dissection and also further quanitfy aneurysm. Moderate AI, mild to moderate aortic stenosis    Dina Rich MD

## 2016-10-21 NOTE — ED Notes (Signed)
Echo in room

## 2016-10-21 NOTE — ED Notes (Signed)
Call by CN to CP who state they are aware of need for echo and will come as soon as possible

## 2016-10-21 NOTE — Progress Notes (Signed)
Pt refused SCDs and refuses bed alarm at night.

## 2016-10-21 NOTE — Consult Note (Signed)
Primary cardiologist: N/A Consulting cardiologist: Dr Dina Rich MD Requesting: Dr Adriana Simas Indication: chest pain  Clinical Summary Kirk Oconnor is a 61 y.o.male with history of HTN, OSA, hep C, self reported prior MI, presents with chest pain and SOB. He self reports an MI in 2002. He says [facility] [place], does not recall ever having a cath or any stress test in the past.   He reports a 2 week history of chest pain. Sharp left sided 10/10 pain can occur at rest or with exertion. +SOB +nausea. Worst with position, worst with deep breaths. Can last up to 10 minutes, occurs multiple times a day. Episode while at pcp today. Mild improvement with NG but no resolution. He reports after getting vicodin in ER pain is significantly better   He reports a 6 week history of progressive SOB and DOE. Now DOE at < 1/2 block. He reports LE edema and some abdominal distension around the same time.     ER vitals: p 96 bp 228/85 96% sat K 3.4, Cr 1.55, Hgb 13.9, Plt 170, BNP 319, trop neg x 1 EKG SR, LAFB. Cannot diagnose LVH by EKG with LAFB but probable LVH CXR mild cardiomegaly, no acute process.     No Known Allergies  Medications Scheduled Medications:    Infusions:    PRN Medications:     Past Medical History:  Diagnosis Date  . Carpal tunnel syndrome   . Hypertension   . Sleep apnea     History reviewed. No pertinent surgical history.  Family History  Problem Relation Age of Onset  . Diabetes Mother   . Heart attack Father     Social History [name] reports that he has quit smoking. He has never used smokeless tobacco. [name] reports that he does not drink alcohol.  Review of Systems CONSTITUTIONAL: No weight loss, fever, chills, weakness or fatigue.  HEENT: Eyes: No visual loss, blurred vision, double vision or yellow sclerae. No hearing loss, sneezing, congestion, runny nose or sore throat.  SKIN: No rash or itching.    CARDIOVASCULAR: per HPI RESPIRATORY: per HPI GASTROINTESTINAL: No anorexia, nausea, vomiting or diarrhea. No abdominal pain or blood.  GENITOURINARY: no polyuria, no dysuria NEUROLOGICAL: No headache, dizziness, syncope, paralysis, ataxia, numbness or tingling in the extremities. No change in bowel or bladder control.  MUSCULOSKELETAL: No muscle, back pain, joint pain or stiffness.  HEMATOLOGIC: No anemia, bleeding or bruising.  LYMPHATICS: No enlarged nodes. No history of splenectomy.  PSYCHIATRIC: No history of depression or anxiety.      Physical Examination Blood pressure 156/76, pulse 82, temperature 97.8 F (36.6 C), temperature source Oral, resp. rate 18, SpO2 91 %. No intake or output data in the 24 hours ending 10/21/16 1203  HEENT: sclera clear, throat clear  Cardiovascular: RRR, 3/6 systolic murmur RUSB, no JVD  Respiratory: mild crackles bilateral bases  GI: abdomen soft, NT, ND  MSK: 1+ bilateral LE edema. Left chest wall is tender to palpation.   Neuro: no focal deficits  Psych: appropriate affect   Lab Results  Basic Metabolic Panel:  Recent Labs Lab 10/21/16 0942  NA 130*  K 3.4*  CL 102  CO2 21*  GLUCOSE 152*  BUN 9  CREATININE 1.55*  CALCIUM 8.5*    Liver Function Tests: No results for input(s): AST, ALT, ALKPHOS, BILITOT, PROT, ALBUMIN in the last 168 hours.  CBC:  Recent Labs Lab 10/21/16 0942  WBC 6.5  HGB 13.9  HCT 40.9  MCV 90.7  PLT 170    Cardiac Enzymes: No results for input(s): CKTOTAL, CKMB, CKMBINDEX, TROPONINI in the last 168 hours.  BNP: Invalid input(s): POCBNP   ECG   Imaging   Impression/Recommendations 1. Chest pain - atypical chest pain symptoms. Worst with position and deep breathing, left chest wall is tender to palpation. Significant improvement in symptoms with vicodin - EKG without specific ischemic changes, trop neg x 1, echo pending - cycle enzymes and EKG overnight, f/u echo results -  start ASA. Please check HgbA1c and FLP   2. SOB/DOE - evidence of volume overload on exam. With significant heart murmur we will obtain echo. History also seems to support poorly controlled bp's, at risk for HTN related systolic or diastolic dysfunction, f/u echo results - IV lasix 20mg  x 1. Elevated Cr on presentation, no clear recent baseline. With evidence of volume overload could be related to venous congestion and CHF. One dose of IV lasix today, follow renal function and fluid status  3. Heart murmur - obtain echo  4. HTN - reports mixed medication compliance - can continue his home maxide and norvasc, pending echo results may require alternative regimen. If bp not controlle dmay need additional changes.      Dina Rich, M.D.

## 2016-10-21 NOTE — ED Notes (Signed)
Val, RN.

## 2016-10-21 NOTE — Progress Notes (Signed)
*  PRELIMINARY RESULTS* Echocardiogram 2D Echocardiogram has been performed.  Kirk Oconnor 10/21/2016, 3:36 PM

## 2016-10-21 NOTE — ED Notes (Signed)
Delay in moving pt due to Echo at bedside which has been delayed and now resumes

## 2016-10-21 NOTE — H&P (Signed)
History and Physical    Kirk Oconnor RUE:[id number] DOB: 05/06/1956 DOA: 10/21/2016  PCP: Johna Sheriff, MD Patient coming from: home  Chief Complaint: chest pain and shortness of breath  HPI: Kirk Oconnor is a 61 y.o. male with medical history significant of HTN, who presents to the hospital with complaints of shortness of breath. Patient reports 2 week history of sharp left sided chest pain that was non radiating and had associated shortness of breath and nausea. Pain occurred on exertion and at rest. Pain is worse with deep breathing. No fever, cough, vomiting or diarrhea. He was released from prison 4 weeks ago. Denies smoking, drugs or alcohol.   ED Course: he was evaluated in emergency room where EKG shows non specific findings and troponins were negative. Blood pressure was noted to be markedly elevated at 228/85. He received nitroglycerin with only some relief. Greater relief received with hydrocodone. Chest xray indicated new cardiomegaly. He was seen by cardiology who recommended admission for observation and rule out ACS. Echo was done in ED which showed normal EF, but severely dilated aortic root.  Review of Systems: As per HPI otherwise 10 point review of systems negative.    Past Medical History:  Diagnosis Date  . Carpal tunnel syndrome   . Hypertension   . Sleep apnea     History reviewed. No pertinent surgical history.   reports that he has quit smoking. He has never used smokeless tobacco. He reports that he does not drink alcohol or use drugs.  No Known Allergies  Family History  Problem Relation Age of Onset  . Diabetes Mother   . Heart attack Father      Prior to Admission medications   Medication Sig Start Date End Date Taking? Authorizing Provider  amLODipine (NORVASC) 10 MG tablet Take 1 tablet (10 mg total) by mouth daily. 04/10/13  Yes Deatra Canter, FNP  furosemide (LASIX) 20 MG tablet Take 20 mg by mouth 2 (two) times daily.   Yes Historical  Provider, MD  hydrOXYzine (ATARAX/VISTARIL) 50 MG tablet Take 50 mg by mouth 2 (two) times daily.   Yes Historical Provider, MD  potassium chloride (K-DUR) 10 MEQ tablet Take 10 mEq by mouth daily.   Yes Historical Provider, MD    Physical Exam: Vitals:   10/21/16 1330 10/21/16 1400 10/21/16 1430 10/21/16 1638  BP: 134/76 134/85 141/76 (!) 147/67  Pulse:    75  Resp: 18  17 20   Temp:    98.3 F (36.8 C)  TempSrc:    Oral  SpO2:    97%  Weight:    82.8 kg (182 lb 8 oz)  Height:    5\' 6"  (1.676 m)      Constitutional: NAD, calm, comfortable Vitals:   10/21/16 1330 10/21/16 1400 10/21/16 1430 10/21/16 1638  BP: 134/76 134/85 141/76 (!) 147/67  Pulse:    75  Resp: 18  17 20   Temp:    98.3 F (36.8 C)  TempSrc:    Oral  SpO2:    97%  Weight:    82.8 kg (182 lb 8 oz)  Height:    5\' 6"  (1.676 m)   Eyes: PERRL, lids and conjunctivae normal ENMT: Mucous membranes are moist. Posterior pharynx clear of any exudate or lesions.Normal dentition.  Neck: normal, supple, no masses, no thyromegaly Respiratory: crackles at bases, no wheezing, no crackles. Normal respiratory effort. No accessory muscle use.  Cardiovascular: Regular rate and rhythm, no murmurs / rubs /  gallops. 1+ extremity edema. 2+ pedal pulses. No carotid bruits.  Abdomen: no tenderness, no masses palpated. No hepatosplenomegaly. Bowel sounds positive.  Musculoskeletal: no clubbing / cyanosis. No joint deformity upper and lower extremities. Good ROM, no contractures. Normal muscle tone.  Skin: no rashes, lesions, ulcers. No induration Neurologic: CN 2-12 grossly intact. Sensation intact, DTR normal. Strength 5/5 in all 4.  Psychiatric: Normal judgment and insight. Alert and oriented x 3. Normal mood.    Labs on Admission: I have personally reviewed following labs and imaging studies  CBC:  Recent Labs Lab 10/21/16 0942  WBC 6.5  HGB 13.9  HCT 40.9  MCV 90.7  PLT 170   Basic Metabolic Panel:  Recent  Labs Lab 10/21/16 0942  NA 130*  K 3.4*  CL 102  CO2 21*  GLUCOSE 152*  BUN 9  CREATININE 1.55*  CALCIUM 8.5*  MG 1.9   GFR: Estimated Creatinine Clearance: 52.5 mL/min (by C-G formula based on SCr of 1.55 mg/dL (H)). Liver Function Tests: No results for input(s): AST, ALT, ALKPHOS, BILITOT, PROT, ALBUMIN in the last 168 hours. No results for input(s): LIPASE, AMYLASE in the last 168 hours. No results for input(s): AMMONIA in the last 168 hours. Coagulation Profile: No results for input(s): INR, PROTIME in the last 168 hours. Cardiac Enzymes: No results for input(s): CKTOTAL, CKMB, CKMBINDEX, TROPONINI in the last 168 hours. BNP (last 3 results) No results for input(s): PROBNP in the last 8760 hours. HbA1C: No results for input(s): HGBA1C in the last 72 hours. CBG: No results for input(s): GLUCAP in the last 168 hours. Lipid Profile: No results for input(s): CHOL, HDL, LDLCALC, TRIG, CHOLHDL, LDLDIRECT in the last 72 hours. Thyroid Function Tests:  Recent Labs  10/21/16 0942  TSH 1.653   Anemia Panel: No results for input(s): VITAMINB12, FOLATE, FERRITIN, TIBC, IRON, RETICCTPCT in the last 72 hours. Urine analysis:    Component Value Date/Time   COLORURINE AMBER (A) 11/30/2011 1910   APPEARANCEUR CLEAR 11/30/2011 1910   LABSPEC 1.025 11/30/2011 1910   PHURINE 6.0 11/30/2011 1910   GLUCOSEU NEGATIVE 11/30/2011 1910   HGBUR NEGATIVE 11/30/2011 1910   BILIRUBINUR NEGATIVE 11/30/2011 1910   KETONESUR NEGATIVE 11/30/2011 1910   PROTEINUR NEGATIVE 11/30/2011 1910   UROBILINOGEN 1.0 11/30/2011 1910   NITRITE NEGATIVE 11/30/2011 1910   LEUKOCYTESUR NEGATIVE 11/30/2011 1910   Sepsis Labs: !!!!!!!!!!!!!!!!!!!!!!!!!!!!!!!!!!!!!!!!!!!! @LABRCNTIP (procalcitonin:4,lacticidven:4) )No results found for this or any previous visit (from the past 240 hour(s)).   Radiological Exams on Admission: Dg Chest 2 View  Result Date: 10/21/2016 CLINICAL DATA:  Chest pain for 1 week.  Shortness of breath for a couple weeks. Ex-smoker with history of hypertension. EXAM: CHEST  2 VIEW COMPARISON:  01/10/2011. FINDINGS: Progressive enlargement of the cardiac silhouette, now mildly enlarged. Aortic ectasia appears unchanged. The lungs are clear. There is no pleural effusion or pneumothorax. No acute osseous findings are seen. IMPRESSION: New mild cardiomegaly with stable aortic ectasia. No acute cardiopulmonary process identified. Electronically Signed   By: Carey Bullocks M.D.   On: 10/21/2016 10:22    EKG: Independently reviewed. No specific ischemic changes  Assessment/Plan Active Problems:   Essential hypertension, benign   Chest pain   Ascending aortic aneurysm (HCC)   Acute diastolic CHF (congestive heart failure) (HCC)    1. Chest pain. Appears to be atypical. Monitor on telemetry and cycle cardiac enzymes.  2. Acute diastolic CHF. Patient does have some evidence of volume overload. Start on IV Lasix. Echocardiogram has been  done that shows normal ejection fraction with grade 1 diastolic dysfunction. Will start on beta blockers. Cardiology has evaluated the patient.  3. Ascending aortic aneurysm. This appears to be a new finding. With ongoing chest pain, CT angiogram has been ordered to rule out dissection.  4. Hypertension. Started on Lopressor. Continue to monitor.   DVT prophylaxis: scds (can start lovenox if no dissection on CT chest) Code Status: full Family Communication: discussed with patient Disposition Plan: discharge home once improved Consults called: cardiology, Dr. Wyline Mood Admission status: observation, telemetry   [facility] MD [facility] Pager [phone]  If 7PM-7AM, please contact night-coverage [email or web address] Password [facility]  10/21/2016, 5:58 PM

## 2016-10-22 DIAGNOSIS — B1920 Unspecified viral hepatitis C without hepatic coma: Secondary | ICD-10-CM | POA: Diagnosis present

## 2016-10-22 DIAGNOSIS — I110 Hypertensive heart disease with heart failure: Secondary | ICD-10-CM | POA: Diagnosis present

## 2016-10-22 DIAGNOSIS — N401 Enlarged prostate with lower urinary tract symptoms: Secondary | ICD-10-CM | POA: Diagnosis present

## 2016-10-22 DIAGNOSIS — I5031 Acute diastolic (congestive) heart failure: Secondary | ICD-10-CM | POA: Diagnosis present

## 2016-10-22 DIAGNOSIS — Z8249 Family history of ischemic heart disease and other diseases of the circulatory system: Secondary | ICD-10-CM | POA: Diagnosis not present

## 2016-10-22 DIAGNOSIS — G4733 Obstructive sleep apnea (adult) (pediatric): Secondary | ICD-10-CM | POA: Diagnosis present

## 2016-10-22 DIAGNOSIS — Z833 Family history of diabetes mellitus: Secondary | ICD-10-CM | POA: Diagnosis not present

## 2016-10-22 DIAGNOSIS — I712 Thoracic aortic aneurysm, without rupture: Secondary | ICD-10-CM | POA: Diagnosis present

## 2016-10-22 DIAGNOSIS — R351 Nocturia: Secondary | ICD-10-CM

## 2016-10-22 DIAGNOSIS — K219 Gastro-esophageal reflux disease without esophagitis: Secondary | ICD-10-CM

## 2016-10-22 DIAGNOSIS — Z9114 Patient's other noncompliance with medication regimen: Secondary | ICD-10-CM | POA: Diagnosis not present

## 2016-10-22 DIAGNOSIS — I252 Old myocardial infarction: Secondary | ICD-10-CM | POA: Diagnosis not present

## 2016-10-22 DIAGNOSIS — I719 Aortic aneurysm of unspecified site, without rupture: Secondary | ICD-10-CM

## 2016-10-22 DIAGNOSIS — J189 Pneumonia, unspecified organism: Secondary | ICD-10-CM | POA: Diagnosis not present

## 2016-10-22 DIAGNOSIS — N400 Enlarged prostate without lower urinary tract symptoms: Secondary | ICD-10-CM | POA: Diagnosis present

## 2016-10-22 DIAGNOSIS — Q2543 Congenital aneurysm of aorta: Secondary | ICD-10-CM | POA: Diagnosis not present

## 2016-10-22 DIAGNOSIS — J181 Lobar pneumonia, unspecified organism: Secondary | ICD-10-CM

## 2016-10-22 DIAGNOSIS — R011 Cardiac murmur, unspecified: Secondary | ICD-10-CM | POA: Diagnosis present

## 2016-10-22 DIAGNOSIS — Z79899 Other long term (current) drug therapy: Secondary | ICD-10-CM | POA: Diagnosis not present

## 2016-10-22 DIAGNOSIS — Z87891 Personal history of nicotine dependence: Secondary | ICD-10-CM | POA: Diagnosis not present

## 2016-10-22 DIAGNOSIS — R079 Chest pain, unspecified: Secondary | ICD-10-CM | POA: Diagnosis not present

## 2016-10-22 DIAGNOSIS — R0602 Shortness of breath: Secondary | ICD-10-CM | POA: Diagnosis not present

## 2016-10-22 LAB — BASIC METABOLIC PANEL
Anion gap: 8 (ref 5–15)
BUN: 15 mg/dL (ref 6–20)
CALCIUM: 8.8 mg/dL — AB (ref 8.9–10.3)
CO2: 23 mmol/L (ref 22–32)
CREATININE: 1.21 mg/dL (ref 0.61–1.24)
Chloride: 102 mmol/L (ref 101–111)
GFR calc non Af Amer: >60 mL/min (ref >60)
GLUCOSE: 107 mg/dL — AB (ref 65–99)
Potassium: 4.7 mmol/L (ref 3.5–5.1)
Sodium: 133 mmol/L — ABNORMAL LOW (ref 135–145)

## 2016-10-22 LAB — TROPONIN I
Troponin I: <0.03 ng/mL (ref <0.03)
Troponin I: <0.03 ng/mL (ref <0.03)

## 2016-10-22 MED ORDER — ALBUTEROL SULFATE (2.5 MG/3ML) 0.083% IN NEBU
2.5000 mg | INHALATION_SOLUTION | RESPIRATORY_TRACT | Status: DC | PRN
  Administered 2016-10-22: 2.5 mg via RESPIRATORY_TRACT
  Filled 2016-10-22: qty 3

## 2016-10-22 MED ORDER — PANTOPRAZOLE SODIUM 40 MG PO TBEC
40.0000 mg | DELAYED_RELEASE_TABLET | Freq: Every day | ORAL | Status: DC
  Administered 2016-10-22 – 2016-10-23 (×2): 40 mg via ORAL
  Filled 2016-10-22 (×2): qty 1

## 2016-10-22 MED ORDER — MORPHINE SULFATE (PF) 2 MG/ML IV SOLN
1.0000 mg | INTRAVENOUS | Status: AC | PRN
  Administered 2016-10-23: 1 mg via INTRAVENOUS
  Filled 2016-10-22: qty 1

## 2016-10-22 MED ORDER — ENOXAPARIN SODIUM 40 MG/0.4ML ~~LOC~~ SOLN
40.0000 mg | SUBCUTANEOUS | Status: DC
  Administered 2016-10-22 – 2016-10-23 (×2): 40 mg via SUBCUTANEOUS
  Filled 2016-10-22 (×2): qty 0.4

## 2016-10-22 MED ORDER — GUAIFENESIN ER 600 MG PO TB12
1200.0000 mg | ORAL_TABLET | Freq: Two times a day (BID) | ORAL | Status: DC
  Administered 2016-10-22 – 2016-10-23 (×2): 1200 mg via ORAL
  Filled 2016-10-22 (×3): qty 2

## 2016-10-22 MED ORDER — LEVOFLOXACIN IN D5W 750 MG/150ML IV SOLN
750.0000 mg | INTRAVENOUS | Status: DC
  Administered 2016-10-22 – 2016-10-23 (×2): 750 mg via INTRAVENOUS
  Filled 2016-10-22 (×2): qty 150

## 2016-10-22 MED ORDER — FINASTERIDE 5 MG PO TABS
5.0000 mg | ORAL_TABLET | Freq: Every day | ORAL | Status: DC
  Administered 2016-10-22 – 2016-10-23 (×2): 5 mg via ORAL
  Filled 2016-10-22 (×3): qty 1

## 2016-10-22 MED ORDER — IPRATROPIUM-ALBUTEROL 0.5-2.5 (3) MG/3ML IN SOLN
3.0000 mL | Freq: Four times a day (QID) | RESPIRATORY_TRACT | Status: DC
  Administered 2016-10-22: 3 mL via RESPIRATORY_TRACT
  Filled 2016-10-22 (×2): qty 3

## 2016-10-22 MED ORDER — POTASSIUM CHLORIDE CRYS ER 20 MEQ PO TBCR
40.0000 meq | EXTENDED_RELEASE_TABLET | Freq: Once | ORAL | Status: AC
  Administered 2016-10-22: 40 meq via ORAL
  Filled 2016-10-22: qty 2

## 2016-10-22 NOTE — Progress Notes (Signed)
PROGRESS NOTE    Kirk Oconnor  VPC:[id number] DOB: March 22, 1956 DOA: 10/21/2016 PCP: Johna Sheriff, MD    Brief Narrative:  61 year old male with history of hypertension who presents to the hospital with complaints of chest pain and shortness of breath. Imaging revealed possible underlying CHF as well as pneumonia. He is being treated with diuretics and antibiotics. He is ruled out for ACS with negative cardiac markers. He will need further follow-up with cardiology. Discharge, likely in the next 24 hours.   Assessment & Plan:   Active Problems:   Essential hypertension, benign   Chest pain   Ascending aortic aneurysm (HCC)   Acute diastolic CHF (congestive heart failure) (HCC)   BPH (benign prostatic hyperplasia)   GERD (gastroesophageal reflux disease)   CAP (community acquired pneumonia)   1. Chest pain. Appears to be atypical. Cardiac enzymes negative. Likely pleuritic pain from CAP.  2. Acute diastolic CHF. Patient does have some evidence of volume overload. Started on IV Lasix. Echocardiogram has been done that shows normal ejection fraction with grade 1 diastolic dysfunction. On beta blockers. Cardiology has evaluated the patient. Will continue IV diuresis for another 24 hours.  3. Aortic root aneurysm. No evidence of dissection on imaging. Follow up with cardiology..  4. Hypertension. Started on Lopressor. Continue to monitor.  5. CAP. Patient has evidence of pneumonia on CT chest with associated cough and pleuritic chest pain. Start on levaquin. Continue pulmonary hygiene  6. GERD. Some improvement with maalox. Will start on protonix  7. BPH. Patient reports nocturia with difficulty initiating urinary stream. He has tried flomax in the past but did not find it helpful. He will need outpatient follow up with urology. Will start on proscar    DVT prophylaxis: lovenox Code Status: full Family Communication: no family present Disposition Plan: discharge home once  improved   Consultants:   cardiology  Procedures:     Antimicrobials:   Levofloxacin 1/27>>   Subjective: Patient continues to feel short of breath on exertion. Has left sided chest pain, productive cough  Objective: Vitals:   10/21/16 1638 10/21/16 2030 10/22/16 0650 10/22/16 1306  BP: (!) 147/67 136/67 136/70   Pulse: 75 76 86   Resp: 20 20 20    Temp: 98.3 F (36.8 C) 97.8 F (36.6 C) 97.6 F (36.4 C)   TempSrc: Oral Oral Oral   SpO2: 97% 96% 94% 99%  Weight: 82.8 kg (182 lb 8 oz)     Height: 5\' 6"  (1.676 m)       Intake/Output Summary (Last 24 hours) at 10/22/16 1457 Last data filed at 10/22/16 1412  Gross per 24 hour  Intake              480 ml  Output              850 ml  Net             -370 ml   Filed Weights   10/21/16 1638  Weight: 82.8 kg (182 lb 8 oz)    Examination:  General exam: Appears calm and comfortable  Respiratory system: Clear to auscultation. Respiratory effort normal. Cardiovascular system: S1 & S2 heard, RRR. No JVD, murmurs, rubs, gallops or clicks. No pedal edema. Gastrointestinal system: Abdomen is nondistended, soft and nontender. No organomegaly or masses felt. Normal bowel sounds heard. Central nervous system: Alert and oriented. No focal neurological deficits. Extremities: Symmetric 5 x 5 power. Skin: No rashes, lesions or ulcers Psychiatry: Judgement and insight  appear normal. Mood & affect appropriate.     Data Reviewed: I have personally reviewed following labs and imaging studies  CBC:  Recent Labs Lab 10/21/16 0942  WBC 6.5  HGB 13.9  HCT 40.9  MCV 90.7  PLT 170   Basic Metabolic Panel:  Recent Labs Lab 10/21/16 0942 10/22/16 0600  NA 130* 133*  K 3.4* 4.7  CL 102 102  CO2 21* 23  GLUCOSE 152* 107*  BUN 9 15  CREATININE 1.55* 1.21  CALCIUM 8.5* 8.8*  MG 1.9  --    GFR: Estimated Creatinine Clearance: 67.2 mL/min (by C-G formula based on SCr of 1.21 mg/dL). Liver Function Tests: No results  for input(s): AST, ALT, ALKPHOS, BILITOT, PROT, ALBUMIN in the last 168 hours. No results for input(s): LIPASE, AMYLASE in the last 168 hours. No results for input(s): AMMONIA in the last 168 hours. Coagulation Profile: No results for input(s): INR, PROTIME in the last 168 hours. Cardiac Enzymes:  Recent Labs Lab 10/21/16 1730 10/22/16 0140 10/22/16 0600  TROPONINI <0.03 <0.03 <0.03   BNP (last 3 results) No results for input(s): PROBNP in the last 8760 hours. HbA1C: No results for input(s): HGBA1C in the last 72 hours. CBG: No results for input(s): GLUCAP in the last 168 hours. Lipid Profile: No results for input(s): CHOL, HDL, LDLCALC, TRIG, CHOLHDL, LDLDIRECT in the last 72 hours. Thyroid Function Tests:  Recent Labs  10/21/16 0942  TSH 1.653   Anemia Panel: No results for input(s): VITAMINB12, FOLATE, FERRITIN, TIBC, IRON, RETICCTPCT in the last 72 hours. Sepsis Labs: No results for input(s): PROCALCITON, LATICACIDVEN in the last 168 hours.  No results found for this or any previous visit (from the past 240 hour(s)).       Radiology Studies: Dg Chest 2 View  Result Date: 10/21/2016 CLINICAL DATA:  Chest pain for 1 week. Shortness of breath for a couple weeks. Ex-smoker with history of hypertension. EXAM: CHEST  2 VIEW COMPARISON:  01/10/2011. FINDINGS: Progressive enlargement of the cardiac silhouette, now mildly enlarged. Aortic ectasia appears unchanged. The lungs are clear. There is no pleural effusion or pneumothorax. No acute osseous findings are seen. IMPRESSION: New mild cardiomegaly with stable aortic ectasia. No acute cardiopulmonary process identified. Electronically Signed   By: Carey Bullocks M.D.   On: 10/21/2016 10:22   Ct Angio Chest Aorta W/cm &/or Wo/cm  Result Date: 10/21/2016 CLINICAL DATA:  Chest pain, aortic root aneurysm seen on echocardiogram EXAM: CT ANGIOGRAPHY CHEST WITH CONTRAST TECHNIQUE: Multidetector CT imaging of the chest was  performed using the standard protocol during bolus administration of intravenous contrast. Multiplanar CT image reconstructions and MIPs were obtained to evaluate the vascular anatomy. CONTRAST:  100 cc of Isovue 370 IV COMPARISON:  None. FINDINGS: Cardiovascular: Aortic root aneurysm is noted with the aorta measuring 3.3 cm at the annulus, 4.7 cm at the sinus of Valsalva and 4 cm at the sino-tubular junction. The ascending aorta tapers to twins the aortic arch which measures 3.1 cm at the arch. The descending aorta measures 3.5 cm in caliber. No acute central pulmonary embolus. Heart is borderline enlarged with coronary arteriosclerosis. Normal branch pattern of the great vessels. Left vertebral artery off the aortic arch as well, a normal variant. Mediastinum/Nodes: No mediastinal adenopathy. Trachea and mainstem bronchi are unremarkable. No esophageal abnormality. Lungs/Pleura: Patchy airspace opacities in the left upper lobe may represent a small foci of pneumonia. No dominant mass is apparent. No effusion seen. No pneumothorax. Upper Abdomen: Unremarkable Musculoskeletal:  Prominent venous collaterals along the upper thoracic spine with enhancement along the venous plexus of several upper thoracic vertebral bodies, clinical significance is uncertain, possibly from vascular congestion. No acute osseous abnormality nor frank bone destruction. Review of the MIP images confirms the above findings. IMPRESSION: Aortic root aneurysm is noted with the aorta measuring 3.3 cm at the annulus, 4.7 cm at the sinus of Valsalva and 4 cm at the sino-tubular junction. No aneurysm of the ascending aorta, arch and descending aorta. No dissection. Patchy airspace opacities in the left upper lobe essentially representing small foci of pneumonia. Consider follow-up to ensure resolution. Enhancement of several upper thoracic vertebral bodies along their dorsal aspect at their vascular pedicle with venous collaterals seen possibly a  result of venous congestion. Electronically Signed   By: Tollie Eth M.D.   On: 10/21/2016 20:55        Scheduled Meds: . aspirin EC  81 mg Oral Daily  . finasteride  5 mg Oral Daily  . furosemide  40 mg Intravenous BID  . guaiFENesin  1,200 mg Oral BID  . ipratropium-albuterol  3 mL Nebulization Q6H  . levofloxacin (LEVAQUIN) IV  750 mg Intravenous Q24H  . metoprolol tartrate  25 mg Oral BID  . pantoprazole  40 mg Oral Daily  . sodium chloride flush  3 mL Intravenous Q12H   Continuous Infusions:   LOS: 0 days    Time spent:    Eyvonne Burchfield, MD [facility] Pager [phone]  If 7PM-7AM, please contact night-coverage [email or web address] Password [email or web address] 10/22/2016, 2:57 PM

## 2016-10-23 LAB — BASIC METABOLIC PANEL
ANION GAP: 7 (ref 5–15)
BUN: 18 mg/dL (ref 6–20)
CALCIUM: 9.1 mg/dL (ref 8.9–10.3)
CO2: 25 mmol/L (ref 22–32)
Chloride: 99 mmol/L — ABNORMAL LOW (ref 101–111)
Creatinine, Ser: 1.11 mg/dL (ref 0.61–1.24)
GFR calc Af Amer: >60 mL/min (ref >60)
GFR calc non Af Amer: >60 mL/min (ref >60)
GLUCOSE: 99 mg/dL (ref 65–99)
Potassium: 4.8 mmol/L (ref 3.5–5.1)
Sodium: 131 mmol/L — ABNORMAL LOW (ref 135–145)

## 2016-10-23 LAB — CBC
HCT: 44.9 % (ref 39.0–52.0)
HEMOGLOBIN: 15.1 g/dL (ref 13.0–17.0)
MCH: 30.7 pg (ref 26.0–34.0)
MCHC: 33.6 g/dL (ref 30.0–36.0)
MCV: 91.3 fL (ref 78.0–100.0)
Platelets: 195 10*3/uL (ref 150–400)
RBC: 4.92 MIL/uL (ref 4.22–5.81)
RDW: 17.5 % — ABNORMAL HIGH (ref 11.5–15.5)
WBC: 8.3 10*3/uL (ref 4.0–10.5)

## 2016-10-23 MED ORDER — GUAIFENESIN ER 600 MG PO TB12: 600.0000 mg | ORAL_TABLET | Freq: Two times a day (BID) | ORAL | 0 refills | Status: DC

## 2016-10-23 MED ORDER — LEVOFLOXACIN 750 MG PO TABS: 750.0000 mg | ORAL_TABLET | Freq: Every day | ORAL | 0 refills | Status: DC

## 2016-10-23 MED ORDER — ASPIRIN 81 MG PO TBEC: 81.0000 mg | DELAYED_RELEASE_TABLET | Freq: Every day | ORAL | 0 refills | Status: DC

## 2016-10-23 MED ORDER — IPRATROPIUM-ALBUTEROL 0.5-2.5 (3) MG/3ML IN SOLN: 3.0000 mL | Freq: Four times a day (QID) | RESPIRATORY_TRACT | Status: DC | PRN

## 2016-10-23 MED ORDER — PANTOPRAZOLE SODIUM 40 MG PO TBEC: 40.0000 mg | DELAYED_RELEASE_TABLET | Freq: Every day | ORAL | 0 refills | Status: DC

## 2016-10-23 MED ORDER — METOPROLOL TARTRATE 25 MG PO TABS: 25.0000 mg | ORAL_TABLET | Freq: Two times a day (BID) | ORAL | 0 refills | Status: DC

## 2016-10-23 MED ORDER — FINASTERIDE 5 MG PO TABS: 5.0000 mg | ORAL_TABLET | Freq: Every day | ORAL | 0 refills | Status: DC

## 2016-10-23 MED ORDER — ALBUTEROL SULFATE HFA 108 (90 BASE) MCG/ACT IN AERS: 2.0000 | INHALATION_SPRAY | Freq: Four times a day (QID) | RESPIRATORY_TRACT | 2 refills | Status: DC | PRN

## 2016-10-23 NOTE — Progress Notes (Signed)
Patient refusing nebs , only takes when needed.

## 2016-10-23 NOTE — Care Management Note (Signed)
Case Management Note  Patient Details  Name: LESSLEY CALDERIN MRN: [id number] Date of Birth: 11/08/1955  Subjective/Objective:                    Action/Plan:   Expected Discharge Date:                  Expected Discharge Plan:  Home/Self Care  In-House Referral:     Discharge planning Services  CM Consult, Medication Assistance, MATCH Program  Post Acute Care Choice:  NA Choice offered to:  NA  DME Arranged:  N/A DME Agency:  NA  HH Arranged:  NA HH Agency:  NA  Status of Service:  Completed, signed off  If discussed at Long Length of Stay Meetings, dates discussed:    Additional Comments: CM  received call from Nicki at [facility] to please MATCH pt.  CM notes pt has Medicaid and calls nicki back to explain we cannot MATCH pts with insurance and Nicki states pt's medicaid has run out and has not been reinitiated.  CM MATCHed pt and Nicki will explain parameters as I have explained them to [facility].  CM faxed MATCH letter with list of participating pharmacies to [phone]. Yves Dill, RN 10/23/2016, 2:33 PM

## 2016-10-23 NOTE — Discharge Summary (Signed)
Physician Discharge Summary  Kirk Oconnor ZOX:[id number] DOB: Sep 03, 1956 DOA: 10/21/2016  PCP: Johna Sheriff, MD  Admit date: 10/21/2016 Discharge date: 10/23/2016  Admitted From: home Disposition: home  Recommendations for Outpatient Follow-up:  1. Follow up with PCP in 1-2 weeks 2. Patient will be scheduled to follow up with cardiology in the next 1-2 weeks 3. Consider outpatient referral to urology for BPH symptoms   Home Health: Equipment/Devices:  Discharge Condition: stable CODE STATUS: full code Diet recommendation: Heart Healthy   Brief/Interim Summary: This is a 61 year old male with a history of hypertension who presented to the hospital with complaints of chest pain and shortness of breath. He was recently released from prison approximately 4 weeks ago. He reported 2 week history of sharp left-sided chest pain which was associated with shortness of breath and nausea. Pain was worse with deep breathing and cough. He was admitted to the hospital and monitor on telemetry. He was seen by cardiology. He ruled out for ACS with negative cardiac markers and no acute EKG changes. CT scan of the chest indicated left upper lobe infiltrate consistent with pneumonia. His chest pain was likely pleuritic in nature, due to pneumonia. He was started on antibiotics and mucolytics. The patient does not have any hypoxia and can ambulate comfortably on room air.  During his workup for chest pain, he underwent echocardiogram that showed dilated aortic root. This was further evaluated by CT chest did not show any evidence of dissection. Plan is to follow with cardiology in the next 1-2 weeks since he may need cardiac catheterization to better define his anatomy. May need surgical correction, but referral to cardiothoracic surgery will be made by cardiology as needed.  Patient did have some evidence of volume overload. It was felt that he had acute diastolic congestive heart failure and was diuresed  with intravenous Lasix. Echocardiogram showed normal ejection fraction. He appears to be approaching to bulimia and has been transitioned back to oral diuretics. He's been started on beta blockers. Blood pressure remains difficult to control, since he intermittently refuses to take medications. This can be further addressed on his cardiology visit.  Patient did also complain of prostate symptoms. He complained of nocturia, difficulty initiating stream, frequency. He reports being on Flomax in the past and did not find this to be helpful. He was reluctant to go back on Flomax. Finasteride was offered to the patient after explaining possible adverse effects. The patient is agreeable to try this. He would benefit from outpatient referral to urology, but will defer this to the primary care physician.  Discharge Diagnoses:  Active Problems:   Essential hypertension, benign   Chest pain   Aortic root aneurysm (HCC)   Acute diastolic CHF (congestive heart failure) (HCC)   BPH (benign prostatic hyperplasia)   GERD (gastroesophageal reflux disease)   CAP (community acquired pneumonia)    Discharge Instructions  Discharge Instructions    Diet - low sodium heart healthy    Complete by:  As directed    Increase activity slowly    Complete by:  As directed      Allergies as of 10/23/2016   No Known Allergies     Medication List    TAKE these medications   albuterol 108 (90 Base) MCG/ACT inhaler Commonly known as:  PROVENTIL HFA;VENTOLIN HFA Inhale 2 puffs into the lungs every 6 (six) hours as needed for wheezing or shortness of breath.   amLODipine 10 MG tablet Commonly known as:  NORVASC Take  1 tablet (10 mg total) by mouth daily.   aspirin 81 MG EC tablet Take 1 tablet (81 mg total) by mouth daily. Start taking on:  10/24/2016   finasteride 5 MG tablet Commonly known as:  PROSCAR Take 1 tablet (5 mg total) by mouth daily. Start taking on:  10/24/2016   furosemide 20 MG  tablet Commonly known as:  LASIX Take 20 mg by mouth 2 (two) times daily.   guaiFENesin 600 MG 12 hr tablet Commonly known as:  MUCINEX Take 1 tablet (600 mg total) by mouth 2 (two) times daily.   hydrOXYzine 50 MG tablet Commonly known as:  ATARAX/VISTARIL Take 50 mg by mouth 2 (two) times daily.   levofloxacin 750 MG tablet Commonly known as:  LEVAQUIN Take 1 tablet (750 mg total) by mouth daily.   metoprolol tartrate 25 MG tablet Commonly known as:  LOPRESSOR Take 1 tablet (25 mg total) by mouth 2 (two) times daily.   pantoprazole 40 MG tablet Commonly known as:  PROTONIX Take 1 tablet (40 mg total) by mouth daily. Start taking on:  10/24/2016   potassium chloride 10 MEQ tablet Commonly known as:  K-DUR Take 10 mEq by mouth daily.       No Known Allergies  Consultations:  cardiology   Procedures/Studies: Dg Chest 2 View  Result Date: 10/21/2016 CLINICAL DATA:  Chest pain for 1 week. Shortness of breath for a couple weeks. Ex-smoker with history of hypertension. EXAM: CHEST  2 VIEW COMPARISON:  01/10/2011. FINDINGS: Progressive enlargement of the cardiac silhouette, now mildly enlarged. Aortic ectasia appears unchanged. The lungs are clear. There is no pleural effusion or pneumothorax. No acute osseous findings are seen. IMPRESSION: New mild cardiomegaly with stable aortic ectasia. No acute cardiopulmonary process identified. Electronically Signed   By: Carey Bullocks M.D.   On: 10/21/2016 10:22   Ct Angio Chest Aorta W/cm &/or Wo/cm  Result Date: 10/21/2016 CLINICAL DATA:  Chest pain, aortic root aneurysm seen on echocardiogram EXAM: CT ANGIOGRAPHY CHEST WITH CONTRAST TECHNIQUE: Multidetector CT imaging of the chest was performed using the standard protocol during bolus administration of intravenous contrast. Multiplanar CT image reconstructions and MIPs were obtained to evaluate the vascular anatomy. CONTRAST:  100 cc of Isovue 370 IV COMPARISON:  None. FINDINGS:  Cardiovascular: Aortic root aneurysm is noted with the aorta measuring 3.3 cm at the annulus, 4.7 cm at the sinus of Valsalva and 4 cm at the sino-tubular junction. The ascending aorta tapers to twins the aortic arch which measures 3.1 cm at the arch. The descending aorta measures 3.5 cm in caliber. No acute central pulmonary embolus. Heart is borderline enlarged with coronary arteriosclerosis. Normal branch pattern of the great vessels. Left vertebral artery off the aortic arch as well, a normal variant. Mediastinum/Nodes: No mediastinal adenopathy. Trachea and mainstem bronchi are unremarkable. No esophageal abnormality. Lungs/Pleura: Patchy airspace opacities in the left upper lobe may represent a small foci of pneumonia. No dominant mass is apparent. No effusion seen. No pneumothorax. Upper Abdomen: Unremarkable Musculoskeletal: Prominent venous collaterals along the upper thoracic spine with enhancement along the venous plexus of several upper thoracic vertebral bodies, clinical significance is uncertain, possibly from vascular congestion. No acute osseous abnormality nor frank bone destruction. Review of the MIP images confirms the above findings. IMPRESSION: Aortic root aneurysm is noted with the aorta measuring 3.3 cm at the annulus, 4.7 cm at the sinus of Valsalva and 4 cm at the sino-tubular junction. No aneurysm of the ascending aorta, arch  and descending aorta. No dissection. Patchy airspace opacities in the left upper lobe essentially representing small foci of pneumonia. Consider follow-up to ensure resolution. Enhancement of several upper thoracic vertebral bodies along their dorsal aspect at their vascular pedicle with venous collaterals seen possibly a result of venous congestion. Electronically Signed   By: Tollie Eth M.D.   On: 10/21/2016 20:55    Echo: - Left ventricle: The cavity size was mildly dilated. Wall   thickness was increased in a pattern of moderate LVH. Systolic   function was  normal. The estimated ejection fraction was in the   range of 55% to 60%. Doppler parameters are consistent with   abnormal left ventricular relaxation (grade 1 diastolic   dysfunction). - Regional wall motion abnormality: Mild hypokinesis of the mid   inferior and basal-mid inferolateral myocardium. - Aortic valve: Moderately calcified annulus. Moderately thickened   leaflets. There was mild to moderate stenosis. There was moderate   regurgitation. Unable to measure AI VC. Mean gradient (S): 20 mm   Hg. Valve area (VTI): 1.99 cm^2. Valve area (Vmax): 1.93 cm^2.   Valve area (Vmean): 2.01 cm^2. Regurgitation pressure half-time:   378 ms. - Aorta: The aortic root is severely dilated. The small visualized   portion of the aortic arch appears to be mildly dilated at 3.8   cm. The visualized portion of the proximal ascending aorta is   severely dilated at 5.5 cm. Aortic root dimension: 54 mm (ED).   Subjective: Still has left sided chest pain with cough  Discharge Exam: Vitals:   10/23/16 1142 10/23/16 1331  BP: (!) 143/69 (!) 171/74  Pulse: 69 73  Resp:  20  Temp:  98.1 F (36.7 C)   Vitals:   10/22/16 2157 10/23/16 0628 10/23/16 1142 10/23/16 1331  BP: 132/74 130/60 (!) 143/69 (!) 171/74  Pulse: 74 67 69 73  Resp: 20 20  20   Temp: 98.1 F (36.7 C) 97.5 F (36.4 C)  98.1 F (36.7 C)  TempSrc: Oral Oral  Oral  SpO2: 99% 99%  99%  Weight:      Height:        General: Pt is alert, awake, not in acute distress Cardiovascular: RRR, S1/S2 +, no rubs, no gallops Respiratory: CTA bilaterally, no wheezing, no rhonchi Abdominal: Soft, NT, ND, bowel sounds + Extremities: no edema, no cyanosis    The results of significant diagnostics from this hospitalization (including imaging, microbiology, ancillary and laboratory) are listed below for reference.     Microbiology: No results found for this or any previous visit (from the past 240 hour(s)).   Labs: BNP (last 3  results)  Recent Labs  10/21/16 0942  BNP 319.0*   Basic Metabolic Panel:  Recent Labs Lab 10/21/16 0942 10/22/16 0600 10/23/16 0610  NA 130* 133* 131*  K 3.4* 4.7 4.8  CL 102 102 99*  CO2 21* 23 25  GLUCOSE 152* 107* 99  BUN 9 15 18   CREATININE 1.55* 1.21 1.11  CALCIUM 8.5* 8.8* 9.1  MG 1.9  --   --    Liver Function Tests: No results for input(s): AST, ALT, ALKPHOS, BILITOT, PROT, ALBUMIN in the last 168 hours. No results for input(s): LIPASE, AMYLASE in the last 168 hours. No results for input(s): AMMONIA in the last 168 hours. CBC:  Recent Labs Lab 10/21/16 0942 10/23/16 0610  WBC 6.5 8.3  HGB 13.9 15.1  HCT 40.9 44.9  MCV 90.7 91.3  PLT 170 195  Cardiac Enzymes:  Recent Labs Lab 10/21/16 1730 10/22/16 0140 10/22/16 0600  TROPONINI <0.03 <0.03 <0.03   BNP: Invalid input(s): POCBNP CBG: No results for input(s): GLUCAP in the last 168 hours. D-Dimer No results for input(s): DDIMER in the last 72 hours. Hgb A1c No results for input(s): HGBA1C in the last 72 hours. Lipid Profile No results for input(s): CHOL, HDL, LDLCALC, TRIG, CHOLHDL, LDLDIRECT in the last 72 hours. Thyroid function studies  Recent Labs  10/21/16 0942  TSH 1.653   Anemia work up No results for input(s): VITAMINB12, FOLATE, FERRITIN, TIBC, IRON, RETICCTPCT in the last 72 hours. Urinalysis    Component Value Date/Time   COLORURINE AMBER (A) 11/30/2011 1910   APPEARANCEUR CLEAR 11/30/2011 1910   LABSPEC 1.025 11/30/2011 1910   PHURINE 6.0 11/30/2011 1910   GLUCOSEU NEGATIVE 11/30/2011 1910   HGBUR NEGATIVE 11/30/2011 1910   BILIRUBINUR NEGATIVE 11/30/2011 1910   KETONESUR NEGATIVE 11/30/2011 1910   PROTEINUR NEGATIVE 11/30/2011 1910   UROBILINOGEN 1.0 11/30/2011 1910   NITRITE NEGATIVE 11/30/2011 1910   LEUKOCYTESUR NEGATIVE 11/30/2011 1910   Sepsis Labs Invalid input(s): PROCALCITONIN,  WBC,  LACTICIDVEN Microbiology No results found for this or any previous  visit (from the past 240 hour(s)).   Time coordinating discharge: Over 30 minutes  SIGNED:   Erick Blinks, MD  [facility] 10/23/2016, 3:19 PM Pager   If 7PM-7AM, please contact night-coverage [email or web address] Password [email or web address]

## 2016-11-01 ENCOUNTER — Other Ambulatory Visit: Payer: Self-pay | Admitting: Family Medicine

## 2016-11-01 ENCOUNTER — Ambulatory Visit (INDEPENDENT_AMBULATORY_CARE_PROVIDER_SITE_OTHER): Payer: Medicaid Other | Admitting: Family Medicine

## 2016-11-01 ENCOUNTER — Encounter: Payer: Self-pay | Admitting: Family Medicine

## 2016-11-01 ENCOUNTER — Ambulatory Visit (INDEPENDENT_AMBULATORY_CARE_PROVIDER_SITE_OTHER): Payer: Self-pay

## 2016-11-01 VITALS — BP 155/83 | HR 82 | Temp 98.2°F | Ht 66.0 in | Wt 185.2 lb

## 2016-11-01 DIAGNOSIS — M5441 Lumbago with sciatica, right side: Secondary | ICD-10-CM

## 2016-11-01 DIAGNOSIS — F339 Major depressive disorder, recurrent, unspecified: Secondary | ICD-10-CM

## 2016-11-01 DIAGNOSIS — M5442 Lumbago with sciatica, left side: Principal | ICD-10-CM

## 2016-11-01 DIAGNOSIS — R3911 Hesitancy of micturition: Secondary | ICD-10-CM

## 2016-11-01 MED ORDER — CITALOPRAM HYDROBROMIDE 20 MG PO TABS: 20.0000 mg | ORAL_TABLET | Freq: Every day | ORAL | 1 refills | Status: DC

## 2016-11-01 MED ORDER — PREDNISONE 20 MG PO TABS: ORAL_TABLET | ORAL | 0 refills | Status: DC

## 2016-11-01 NOTE — Patient Instructions (Signed)
Great to see you!  Come back in 3 weeks to discuss depression  Start prednisone for 5 days  We will call with x ray results within 1 week

## 2016-11-01 NOTE — Progress Notes (Signed)
HPI  Patient presents today here for back pain.  Pt is self pay  Patient was admitted to the hospital on 10/21/2016 with chest pain. ACS was ruled out with troponins in unchanging EKG. He did have interval development of what appeared to be acute diastolic CHF. With diuretics and started on a beta blocker.  Patient is only taking diuretics occasionally at home. He complains about the diuresis. He would like to see a urologist, he has started finasteride but doesn't want to take flomax again.   He complains of severe back pain. He states this started just before going the hospital. He states lying in the hospital that made it worse. He complains of midline lumbar back pain with radiation down the bilateral lower extremities posteriorly to the feet. He does not have any leg weakness, bowel or bladder dysfunction, or saddle anesthesia.  Depression Patient cites several recent events and his family that are very depressing. He admits to occasional feelings of feeling like you be better off dead. He denies any suicidal thoughts and contracts for safety quickly after discussion. He would like to start medications today.  Patient was released from prison a few months ago and is currently still wearing an ankle monitor  PMH: Smoking status noted ROS: Per HPI  Objective: BP (!) 155/83   Pulse 82   Temp 98.2 F (36.8 C) (Oral)   Ht 5\' 6"  (1.676 m)   Wt 185 lb 3.2 oz (84 kg)   BMI 29.89 kg/m  Gen: NAD, alert, cooperative with exam HEENT: NCAT CV: RRR, good S1/S2, 4/6 systolic murmur at LSB Resp: CTABL, no wheezes, non-labored Ext: No edema, warm Neuro: Alert and oriented, strength 5/5 and sensation intact in bilateral lower extremities 2+ patellar tendon reflexes bilaterally  MSK: Tenderness to palpation over the midline spine about the level of L2 or L3. No paraspinal muscle tenderness to palpation in the lumbar spine Negative straight leg raise bilaterally   Depression screen  PHQ 2/9 11/01/2016  Decreased Interest 3  Down, Depressed, Hopeless 3  PHQ - 2 Score 6  Altered sleeping 0  Tired, decreased energy 3  Change in appetite 3  Feeling bad or failure about yourself  3  Trouble concentrating 3  Moving slowly or fidgety/restless 3  Suicidal thoughts 0  PHQ-9 Score 21  Difficult doing work/chores Somewhat difficult     Assessment and plan:  # Acute midline low back pain with bilateral sciatica Treat with prednisone course Avoid NSAIDs given recent failure diagnosis  # Depression Start celexa Contracts for safety and states only passive thoughts Follow up 3-4 weeks  # urinary hesitancy Most likely BPH, Pt declines exam today He started proscar in the hospital, he does not want to take flomax which he has taken previously Refer to urology  Pt is cash pay today, avoided extra labs- stable leaving hosp Needs ID referral pending Hep C discussion Needs PSA      Orders Placed This Encounter  Procedures  . Ambulatory referral to Urology    Referral Priority:   Routine    Referral Type:   Consultation    Referral Reason:   Specialty Services Required    Requested Specialty:   Urology    Number of Visits Requested:   1    Meds ordered this encounter  Medications  . predniSONE (DELTASONE) 20 MG tablet    Sig: 2 po at same time daily for 5 days    Dispense:  10 tablet  Refill:  0  . citalopram (CELEXA) 20 MG tablet    Sig: Take 1 tablet (20 mg total) by mouth daily.    Dispense:  30 tablet    Refill:  1    [name], MD [facility] 11/01/2016, 5:17 PM

## 2016-11-09 ENCOUNTER — Telehealth: Payer: Self-pay | Admitting: *Deleted

## 2016-11-09 NOTE — Telephone Encounter (Signed)
LMTCB 2/14-jhb

## 2016-11-09 NOTE — Telephone Encounter (Signed)
These medications are the most inexpensive meds availble to my knowledge. Citalopram is on the 4 $ list at [facility], prednisone should be similarly inexpensive.   [name], MD [facility] 11/09/2016, 1:48 PM

## 2016-11-09 NOTE — Telephone Encounter (Signed)
Patient was seen on 11/01/2016 and was prescribed prednisone and citalopram.  Patient states that the medications are to expensive and would like to have something else sent to pharmacy.

## 2016-11-15 NOTE — Telephone Encounter (Signed)
lmtcb jkp 2/20 

## 2016-11-17 ENCOUNTER — Encounter: Payer: Self-pay | Admitting: Family Medicine

## 2016-11-17 ENCOUNTER — Ambulatory Visit (INDEPENDENT_AMBULATORY_CARE_PROVIDER_SITE_OTHER): Payer: Self-pay | Admitting: Family Medicine

## 2016-11-17 VITALS — BP 173/83 | HR 71 | Temp 97.5°F | Ht 66.0 in | Wt 183.6 lb

## 2016-11-17 DIAGNOSIS — T23002A Burn of unspecified degree of left hand, unspecified site, initial encounter: Secondary | ICD-10-CM

## 2016-11-17 DIAGNOSIS — T300 Burn of unspecified body region, unspecified degree: Secondary | ICD-10-CM

## 2016-11-17 DIAGNOSIS — I10 Essential (primary) hypertension: Secondary | ICD-10-CM

## 2016-11-17 DIAGNOSIS — I77810 Thoracic aortic ectasia: Secondary | ICD-10-CM

## 2016-11-17 DIAGNOSIS — F339 Major depressive disorder, recurrent, unspecified: Secondary | ICD-10-CM

## 2016-11-17 MED ORDER — CITALOPRAM HYDROBROMIDE 20 MG PO TABS: 20.0000 mg | ORAL_TABLET | Freq: Every day | ORAL | 5 refills | Status: DC

## 2016-11-17 MED ORDER — METOPROLOL TARTRATE 25 MG PO TABS: 25.0000 mg | ORAL_TABLET | Freq: Two times a day (BID) | ORAL | 5 refills | Status: DC

## 2016-11-17 MED ORDER — AMLODIPINE BESYLATE 10 MG PO TABS: 10.0000 mg | ORAL_TABLET | Freq: Every day | ORAL | 5 refills | Status: DC
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
## 2016-11-17 NOTE — Patient Instructions (Signed)
Great to see you!  Start celexa ( for depression), metoprolol, and amlodipine today  You will need to arrange a cardiology appointment  Your provider wants you to schedule an appointment with a Psychologist/Psychiatrist. The following list of offices requires the patient to call and make their own appointment, as there is information they need that only you can provide. Please feel free to choose form the following providers:  [facility]   [phone] Crisis Recovery in [place] [phone]  [facility]  [phone] [place], [place]  (Scheduled through [facility]) Must call and do an interview for appointment. Sees Children / Accepts Medicaid  [facility]    [phone]  [address], [address]    [place], [place]       [facility]  [phone] [address] [place], [place]  Evaluates for Autism but does not treat it Sees Children / Accepts Medicaid  [facility]    [phone] [address], [address]   [place], [place] Medication management, substance abuse, bipolar, grief, family, marriage, OCD, anxiety, PTSD Sees children / Accepts Medicaid  [facility]    [phone] [address] [place], [place] Sees children / Accepts [facility]  [facility]  [phone] [address] [place], [place]   Dr Estelle Grumbles     [phone] [address] [place], [place]  Sees ADD & ADHD for treatment Accepts Medicaid  [facility]  [phone] [address] [place], [place] Evaluates for Autism Accepts [facility]  [facility]  [phone] [address] [place], [place]  Does Adult ADD evaluations Does not accept Medicaid  [facility]   [phone] [address] [place], [place] Uses animal therapy  Sees children as young as 20 years old Accepts [facility]     [phone]    [address]  [place], [place] [address] Sees children Accepts Medicaid

## 2016-11-21 ENCOUNTER — Telehealth: Payer: Self-pay | Admitting: Pediatrics

## 2016-11-21 DIAGNOSIS — I10 Essential (primary) hypertension: Secondary | ICD-10-CM

## 2016-11-21 MED ORDER — PANTOPRAZOLE SODIUM 40 MG PO TBEC: 40.0000 mg | DELAYED_RELEASE_TABLET | Freq: Every day | ORAL | 2 refills | Status: DC

## 2016-11-21 MED ORDER — CITALOPRAM HYDROBROMIDE 20 MG PO TABS: 20.0000 mg | ORAL_TABLET | Freq: Every day | ORAL | 2 refills | Status: DC

## 2016-11-21 MED ORDER — POTASSIUM CHLORIDE ER 10 MEQ PO TBCR: 10.0000 meq | EXTENDED_RELEASE_TABLET | Freq: Every day | ORAL | 0 refills | Status: DC

## 2016-11-21 MED ORDER — METOPROLOL TARTRATE 25 MG PO TABS: 25.0000 mg | ORAL_TABLET | Freq: Two times a day (BID) | ORAL | 2 refills | Status: DC

## 2016-11-21 MED ORDER — FINASTERIDE 5 MG PO TABS: 5.0000 mg | ORAL_TABLET | Freq: Every day | ORAL | 2 refills | Status: DC

## 2016-11-21 MED ORDER — AMLODIPINE BESYLATE 10 MG PO TABS: 10.0000 mg | ORAL_TABLET | Freq: Every day | ORAL | 2 refills | Status: DC

## 2016-11-21 NOTE — Telephone Encounter (Signed)
All medications reordered to [facility] he does not use [facility]

## 2016-12-14 ENCOUNTER — Ambulatory Visit: Payer: Medicaid Other | Admitting: Cardiovascular Disease

## 2016-12-21 ENCOUNTER — Emergency Department (HOSPITAL_COMMUNITY)
Admission: EM | Admit: 2016-12-21 | Discharge: 2016-12-21 | Disposition: A | Discharge status: Home / Self Care | Payer: Medicaid Other | Attending: Emergency Medicine | Admitting: Emergency Medicine

## 2016-12-21 ENCOUNTER — Encounter: Payer: Self-pay | Admitting: Family Medicine

## 2016-12-21 ENCOUNTER — Ambulatory Visit (INDEPENDENT_AMBULATORY_CARE_PROVIDER_SITE_OTHER): Payer: Self-pay | Admitting: Family Medicine

## 2016-12-21 ENCOUNTER — Emergency Department (HOSPITAL_COMMUNITY): Payer: Medicaid Other

## 2016-12-21 VITALS — BP 140/76 | HR 89 | Temp 96.9°F | Ht 66.0 in | Wt 177.0 lb

## 2016-12-21 DIAGNOSIS — R079 Chest pain, unspecified: Secondary | ICD-10-CM

## 2016-12-21 DIAGNOSIS — I110 Hypertensive heart disease with heart failure: Secondary | ICD-10-CM | POA: Insufficient documentation

## 2016-12-21 DIAGNOSIS — I5031 Acute diastolic (congestive) heart failure: Secondary | ICD-10-CM | POA: Insufficient documentation

## 2016-12-21 DIAGNOSIS — R072 Precordial pain: Secondary | ICD-10-CM | POA: Insufficient documentation

## 2016-12-21 DIAGNOSIS — Z7982 Long term (current) use of aspirin: Secondary | ICD-10-CM | POA: Insufficient documentation

## 2016-12-21 DIAGNOSIS — Z87891 Personal history of nicotine dependence: Secondary | ICD-10-CM | POA: Insufficient documentation

## 2016-12-21 LAB — COMPREHENSIVE METABOLIC PANEL
ALT: 45 U/L (ref 17–63)
AST: 38 U/L (ref 15–41)
Albumin: 3 g/dL — ABNORMAL LOW (ref 3.5–5.0)
Alkaline Phosphatase: 71 U/L (ref 38–126)
Anion gap: 6 (ref 5–15)
BUN: 11 mg/dL (ref 6–20)
CO2: 24 mmol/L (ref 22–32)
Calcium: 8.8 mg/dL — ABNORMAL LOW (ref 8.9–10.3)
Chloride: 108 mmol/L (ref 101–111)
Creatinine, Ser: 0.98 mg/dL (ref 0.61–1.24)
GFR calc Af Amer: >60 mL/min (ref >60)
GLUCOSE: 110 mg/dL — AB (ref 65–99)
POTASSIUM: 3.8 mmol/L (ref 3.5–5.1)
SODIUM: 138 mmol/L (ref 135–145)
Total Bilirubin: 1.1 mg/dL (ref 0.3–1.2)
Total Protein: 6.5 g/dL (ref 6.5–8.1)

## 2016-12-21 LAB — I-STAT TROPONIN, ED: TROPONIN I, POC: 0 ng/mL (ref 0.00–0.08)

## 2016-12-21 LAB — CBC
HCT: 40.7 % (ref 39.0–52.0)
Hemoglobin: 13.8 g/dL (ref 13.0–17.0)
MCH: 31 pg (ref 26.0–34.0)
MCHC: 33.9 g/dL (ref 30.0–36.0)
MCV: 91.5 fL (ref 78.0–100.0)
PLATELETS: 206 10*3/uL (ref 150–400)
RBC: 4.45 MIL/uL (ref 4.22–5.81)
RDW: 15.5 % (ref 11.5–15.5)
WBC: 6.9 10*3/uL (ref 4.0–10.5)

## 2016-12-21 LAB — BRAIN NATRIURETIC PEPTIDE: B Natriuretic Peptide: 206.5 pg/mL — ABNORMAL HIGH (ref 0.0–100.0)

## 2016-12-21 MED ORDER — HYDROCORTISONE 1 % EX CREA: TOPICAL_CREAM | CUTANEOUS | 0 refills | Status: DC

## 2016-12-21 MED ORDER — SODIUM CHLORIDE 0.9 % IV SOLN
INTRAVENOUS | Status: DC
  Administered 2016-12-21: 20 mL/h via INTRAVENOUS

## 2016-12-21 NOTE — ED Notes (Signed)
Pt ambulated to waiting room with charge nurse.

## 2016-12-21 NOTE — Progress Notes (Signed)
   HPI  Patient presents today here with chest pain.  Patient states his chest pain started one day ago. He describes sharp left-sided chest pain with radiation to his left neck. It's associated with shortness of breath, they both get worse with exertion.  Patient was seen in the emergency room and admitted for chest pain in January, ACS was ruled out at that time. He was found to have a largely dilated aortic root.  Patient states that he is taking all his medications this morning.   PMH: Smoking status noted ROS: Per HPI  Objective: BP 140/76   Pulse 89   Temp (!) 96.9 F (36.1 C) (Oral)   Ht 5\' 6"  (1.676 m)   Wt 177 lb (80.3 kg)   SpO2 97%   BMI 28.57 kg/m  Gen: NAD, alert, cooperative with exam HEENT: NCAT CV: RRR, good S1/S2, loud 3/6 systolic murmur at the right sternal border Chest wall- Pain with palpation of left sternal border Resp: CTABL, no wheezes, non-labored Ext: No edema, warm Neuro: Alert and oriented, No gross deficits  Assessment and plan:  # Chest pain Patient does have regretful chest pain on exam, however he does have pain consistent with ACS as well. He describes radiation to the neck, exertional symptoms and also shortness of breath. EKG today shows significant changes from previous EKG in January, he has ST elevation in V1, V2, V3. He also has a new left bundle branch block. Transferring to the emergency room via EMS for evaluation of ACS. Appreciate ED's evaluation.  She has been given 325 mg of ASA. O2 placed IV being inserted currently Nitroglycerin to be given after IV started    Orders Placed This Encounter  Procedures  . EKG 12-Lead    [name], MD [facility] 12/21/2016, 10:17 AM

## 2016-12-21 NOTE — Discharge Instructions (Signed)
It was our pleasure to provide your ER care today - we hope that you feel better.  Follow up with primary care doctor in the next 1-2 weeks.   For chest pain, follow up with cardiologist in the coming week - see referral - call office to arrange appointment.  Your prior admission shows that you have a dilated aorta - follow up with cardiothoracic surgery as an outpatient - call office to arrange appointment.   Return to ER if worse, new symptoms, fevers, trouble breathing, recurrent chest pain, other concern.

## 2016-12-21 NOTE — ED Triage Notes (Signed)
Patient CO L sided CP since last night with worsening SOB for the past 3 weeks with no radiation of pian. 325 ASA at MD office and 1 nitro with EMS. Pain is 8/10 and sinus on the monitor with EMS

## 2016-12-21 NOTE — ED Notes (Signed)
Pt refusing to sign for discharge.

## 2016-12-21 NOTE — ED Notes (Signed)
Pt stating "I don't know anybody and don't have a ride home." RN asked pt if he could call someone to come get him or if he had money and we could call him a cab. Pt stated "I don't have any money to pay for a cab." This RN contacted the SW and the SW told the pt we could not get pt cab voucher.

## 2016-12-21 NOTE — ED Provider Notes (Addendum)
[facility]-EMERGENCY DEPT Provider Note   CSN: [id number] Arrival date & time: 12/21/16  1145     History   Chief Complaint Chief Complaint  Patient presents with  . Chest Pain    HPI HENDRIXX SEVERIN is a 61 y.o. male.  Patient mid to left chest pain for the past 3 weeks. Pain constant, dull, moderate, non radiating. No specific exacerbating or alleviating factors. Pain is not pleuritic. +sob. No diaphoresis or nv. Denies hx cad. States both parents did have MI's. Denies leg pain or swelling. No fever or chills. No cough or uri c/o. Denies smoking or drug use. No chest wall injury or strain. No heartburn.    The history is provided by the patient.  Chest Pain   Associated symptoms include shortness of breath. Pertinent negatives include no abdominal pain, no back pain, no fever, no headaches and no vomiting.    Past Medical History:  Diagnosis Date  . Carpal tunnel syndrome   . Hypertension   . Sleep apnea     Patient Active Problem List   Diagnosis Date Noted  . BPH (benign prostatic hyperplasia) 10/22/2016  . GERD (gastroesophageal reflux disease) 10/22/2016  . CAP (community acquired pneumonia) 10/22/2016  . Chest pain 10/21/2016  . Aortic root dilation (HCC) 10/21/2016  . Acute diastolic CHF (congestive heart failure) (HCC) 10/21/2016  . Essential hypertension, benign 03/27/2013  . Hepatitis C 03/27/2013  . Snoring 03/27/2013    No past surgical history on file.     Home Medications    Prior to Admission medications   Medication Sig Start Date End Date Taking? Authorizing Provider  albuterol (PROVENTIL HFA;VENTOLIN HFA) 108 (90 Base) MCG/ACT inhaler Inhale 2 puffs into the lungs every 6 (six) hours as needed for wheezing or shortness of breath. 10/23/16   Erick Blinks, MD  amLODipine (NORVASC) 10 MG tablet Take 1 tablet (10 mg total) by mouth daily. 11/21/16   Elenora Gamma, MD  aspirin EC 81 MG EC tablet Take 1 tablet (81 mg total) by mouth daily. 10/24/16    Erick Blinks, MD  citalopram (CELEXA) 20 MG tablet Take 1 tablet (20 mg total) by mouth daily. 11/21/16   Elenora Gamma, MD  finasteride (PROSCAR) 5 MG tablet Take 1 tablet (5 mg total) by mouth daily. 11/21/16   Elenora Gamma, MD  hydrOXYzine (ATARAX/VISTARIL) 50 MG tablet Take 50 mg by mouth 2 (two) times daily.    Historical Provider, MD  metoprolol tartrate (LOPRESSOR) 25 MG tablet Take 1 tablet (25 mg total) by mouth 2 (two) times daily. 11/21/16   Elenora Gamma, MD  pantoprazole (PROTONIX) 40 MG tablet Take 1 tablet (40 mg total) by mouth daily. 11/21/16   Elenora Gamma, MD  potassium chloride (K-DUR) 10 MEQ tablet Take 1 tablet (10 mEq total) by mouth daily. 11/21/16   Elenora Gamma, MD    Family History Family History  Problem Relation Age of Onset  . Diabetes Mother   . Heart attack Father     Social History Social History  Substance Use Topics  . Smoking status: Former Games developer  . Smokeless tobacco: Never Used  . Alcohol use No     Allergies   Patient has no known allergies.   Review of Systems Review of Systems  Constitutional: Negative for fever.  HENT: Negative for sore throat.   Eyes: Negative for redness.  Respiratory: Positive for shortness of breath.   Cardiovascular: Positive for chest pain. Negative for leg  swelling.  Gastrointestinal: Negative for abdominal pain and vomiting.  Genitourinary: Negative for flank pain.  Musculoskeletal: Negative for back pain and neck pain.  Skin: Negative for rash.  Neurological: Negative for headaches.  Hematological: Does not bruise/bleed easily.  Psychiatric/Behavioral: Negative for confusion.     Physical Exam Updated Vital Signs BP (!) 150/75   Pulse 74   Temp 98 F (36.7 C) (Oral)   Resp 18   SpO2 99%   Physical Exam  Constitutional: He appears well-developed and well-nourished. No distress.  HENT:  Mouth/Throat: Oropharynx is clear and moist.  Eyes: Conjunctivae are normal.  Neck:  Neck supple. No tracheal deviation present.  Cardiovascular: Normal rate, regular rhythm, normal heart sounds and intact distal pulses.  Exam reveals no gallop and no friction rub.   No murmur heard. Pulmonary/Chest: Effort normal and breath sounds normal. No accessory muscle usage. No respiratory distress. He exhibits no tenderness.  Abdominal: Soft. Bowel sounds are normal. He exhibits no distension. There is no tenderness.  Musculoskeletal: He exhibits no edema or tenderness.  Neurological: He is alert.  Skin: Skin is warm and dry. He is not diaphoretic.  Psychiatric: He has a normal mood and affect.  Nursing note and vitals reviewed.    ED Treatments / Results  Labs (all labs ordered are listed, but only abnormal results are displayed) Results for orders placed or performed during the hospital encounter of 12/21/16  CBC  Result Value Ref Range   WBC 6.9 4.0 - 10.5 K/uL   RBC 4.45 4.22 - 5.81 MIL/uL   Hemoglobin 13.8 13.0 - 17.0 g/dL   HCT 14.7 82.9 - 56.2 %   MCV 91.5 78.0 - 100.0 fL   MCH 31.0 26.0 - 34.0 pg   MCHC 33.9 30.0 - 36.0 g/dL   RDW 13.0 86.5 - 78.4 %   Platelets 206 150 - 400 K/uL  Comprehensive metabolic panel  Result Value Ref Range   Sodium 138 135 - 145 mmol/L   Potassium 3.8 3.5 - 5.1 mmol/L   Chloride 108 101 - 111 mmol/L   CO2 24 22 - 32 mmol/L   Glucose, Bld 110 (H) 65 - 99 mg/dL   BUN 11 6 - 20 mg/dL   Creatinine, Ser 6.96 0.61 - 1.24 mg/dL   Calcium 8.8 (L) 8.9 - 10.3 mg/dL   Total Protein 6.5 6.5 - 8.1 g/dL   Albumin 3.0 (L) 3.5 - 5.0 g/dL   AST 38 15 - 41 U/L   ALT 45 17 - 63 U/L   Alkaline Phosphatase 71 38 - 126 U/L   Total Bilirubin 1.1 0.3 - 1.2 mg/dL   GFR calc non Af Amer >60 >60 mL/min   GFR calc Af Amer >60 >60 mL/min   Anion gap 6 5 - 15  Brain natriuretic peptide  Result Value Ref Range   B Natriuretic Peptide 206.5 (H) 0.0 - 100.0 pg/mL  I-stat troponin, ED  Result Value Ref Range   Troponin i, poc 0.00 0.00 - 0.08 ng/mL    Comment 3           Dg Chest Port 1 View  Result Date: 12/21/2016 CLINICAL DATA:  Chest pain.  Shortness of breath . EXAM: PORTABLE CHEST 1 VIEW COMPARISON:  10/21/2016 . FINDINGS: Cardiomegaly with normal pulmonary vascularity. No focal infiltrate. No pleural effusion or pneumothorax. No acute bony abnormality . IMPRESSION: Cardiomegaly.  No acute or focal abnormality. Electronically Signed   By: Maisie Fus  Register   On: 12/21/2016  12:49    EKG  EKG Interpretation  Date/Time:  Wednesday December 21 2016 11:52:57 EDT Ventricular Rate:  74 PR Interval:    QRS Duration: 116 QT Interval:  399 QTC Calculation: 443 R Axis:   -62 Text Interpretation:  Sinus rhythm Left anterior fascicular block Left ventricular hypertrophy Non-specific ST-t changes No significant change since last tracing Confirmed by Denton Lank  MD, Caryn Bee ([id number]) on 12/21/2016 1:01:51 PM       Radiology Dg Chest Port 1 View  Result Date: 12/21/2016 CLINICAL DATA:  Chest pain.  Shortness of breath . EXAM: PORTABLE CHEST 1 VIEW COMPARISON:  10/21/2016 . FINDINGS: Cardiomegaly with normal pulmonary vascularity. No focal infiltrate. No pleural effusion or pneumothorax. No acute bony abnormality . IMPRESSION: Cardiomegaly.  No acute or focal abnormality. Electronically Signed   By: Maisie Fus  Register   On: 12/21/2016 12:49    Procedures Procedures (including critical care time)  Medications Ordered in ED Medications  0.9 %  sodium chloride infusion (not administered)     Initial Impression / Assessment and Plan / ED Course  I have reviewed the triage vital signs and the nursing notes.  Pertinent labs & imaging results that were available during my care of the patient were reviewed by me and considered in my medical decision making (see chart for details).  Iv ns. Continuous pulse ox and monitor.   Pt already had ntg and asa by ems.   Labs sent. Stat pcxr.   ecg.  Reviewed nursing notes and prior charts for additional  history.   Pt presents w constant chest pain for many days - after which troponin is 0. cxr neg.   On recheck, no pain or discomfort, pt asking for sandwich and drink - refreshments provided.  On review chart, prior cp admit approx 2 months ago - cardiac markers and ct angio chest then negative for acute process.    For cp, will have f/u cardiology.    At d/c, pt asks for hydrocortisone cream rx - provided.   Final Clinical Impressions(s) / ED Diagnoses   Final diagnoses:  None    New Prescriptions New Prescriptions   No medications on file        Cathren Laine, MD 12/21/16 1545

## 2016-12-29 ENCOUNTER — Ambulatory Visit (INDEPENDENT_AMBULATORY_CARE_PROVIDER_SITE_OTHER): Payer: Medicaid Other | Admitting: Cardiovascular Disease

## 2016-12-29 ENCOUNTER — Encounter: Payer: Self-pay | Admitting: *Deleted

## 2016-12-29 ENCOUNTER — Encounter: Payer: Self-pay | Admitting: Cardiovascular Disease

## 2016-12-29 VITALS — BP 104/84 | HR 95 | Ht 66.0 in | Wt 172.0 lb

## 2016-12-29 DIAGNOSIS — I351 Nonrheumatic aortic (valve) insufficiency: Secondary | ICD-10-CM

## 2016-12-29 DIAGNOSIS — R0609 Other forms of dyspnea: Secondary | ICD-10-CM

## 2016-12-29 DIAGNOSIS — I350 Nonrheumatic aortic (valve) stenosis: Secondary | ICD-10-CM | POA: Diagnosis not present

## 2016-12-29 DIAGNOSIS — R079 Chest pain, unspecified: Secondary | ICD-10-CM | POA: Diagnosis not present

## 2016-12-29 DIAGNOSIS — Z9289 Personal history of other medical treatment: Secondary | ICD-10-CM | POA: Diagnosis not present

## 2016-12-29 DIAGNOSIS — M5441 Lumbago with sciatica, right side: Secondary | ICD-10-CM | POA: Diagnosis not present

## 2016-12-29 DIAGNOSIS — I10 Essential (primary) hypertension: Secondary | ICD-10-CM

## 2016-12-29 DIAGNOSIS — I712 Thoracic aortic aneurysm, without rupture: Secondary | ICD-10-CM

## 2016-12-29 DIAGNOSIS — I7121 Aneurysm of the ascending aorta, without rupture: Secondary | ICD-10-CM

## 2016-12-29 NOTE — Patient Instructions (Signed)
Medication Instructions:  Continue all current medications.  Labwork: none  Testing/Procedures:  Your physician has requested that you have a lexiscan myoview. For further information please visit [email or web address] Please follow instruction sheet, as given.  Office will contact with results via phone or letter.    Follow-Up: 2-3 weeks with Dr. Wyline Mood   Any Other Special Instructions Will Be Listed Below (If Applicable).  If you need a refill on your cardiac medications before your next appointment, please call your pharmacy.

## 2016-12-29 NOTE — Progress Notes (Signed)
SUBJECTIVE: The patient is a 61 year old male who I am seeing for the first time. He saw Dr. Harl Bowie on 10/21/16 while hospitalized. At that time he had chest pain which was deemed atypical.  Echocardiogram 10/21/16: Normal left ventricular systolic function, LVEF 50-56%, moderate LVH, grade 1 diastolic dysfunction, regional variation of inferior and inferolateral walls, mild to moderate aortic stenosis, moderate aortic regurgitation, with severe dilatation of the proximal ascending aorta at 5.5 cm.  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 There was no ascending aortic aneurysm nor dissection.  He was evaluated for chest pain in the ED on 12/21/16 after being sent there by his PCP. Troponins and chest x-ray were unremarkable. BNP was mildly elevated at 206. Basic met about panel and CBC were unremarkable.  ECG which I personally interpreted showed normal sinus rhythm with LVH and consequent repolarization abnormalities.  When I entered the room, he was standing up complaining of chest pain, lower back pain, and right leg pain. He said it began the day before yesterday. He said he wants to be evaluated for COPD. He has both chest pain and shortness of breath with exertion.  I reviewed reports of lumbar x-rays which showed early DJD of L4-L5. There was no bony distraction of his spine by CT angiogram in January.  He denies smoking.  Review of Systems: As per "subjective", otherwise negative.  No Known Allergies  Current Outpatient Prescriptions  Medication Sig Dispense Refill  . albuterol (PROVENTIL HFA;VENTOLIN HFA) 108 (90 Base) MCG/ACT inhaler Inhale 2 puffs into the lungs every 6 (six) hours as needed for wheezing or shortness of breath. 1 Inhaler 2  . amLODipine (NORVASC) 10 MG tablet Take 1 tablet (10 mg total) by mouth daily. 30 tablet 2  . aspirin EC 81 MG EC tablet Take 1 tablet (81  mg total) by mouth daily. 30 tablet 0  . citalopram (CELEXA) 20 MG tablet Take 1 tablet (20 mg total) by mouth daily. 30 tablet 2  . finasteride (PROSCAR) 5 MG tablet Take 1 tablet (5 mg total) by mouth daily. 30 tablet 2  . guaiFENesin (MUCINEX) 600 MG 12 hr tablet Take 600 mg by mouth 2 (two) times daily.    . hydrocortisone cream 1 % Apply to affected area 2 times daily 30 g 0  . hydrOXYzine (ATARAX/VISTARIL) 50 MG tablet Take 50 mg by mouth 2 (two) times daily.    Marland Kitchen levofloxacin (LEVAQUIN) 750 MG tablet Take 750 mg by mouth daily.    . metoprolol tartrate (LOPRESSOR) 25 MG tablet Take 1 tablet (25 mg total) by mouth 2 (two) times daily. 60 tablet 2  . pantoprazole (PROTONIX) 40 MG tablet Take 1 tablet (40 mg total) by mouth daily. 30 tablet 2  . potassium chloride (K-DUR) 10 MEQ tablet Take 1 tablet (10 mEq total) by mouth daily. 30 tablet 0   No current facility-administered medications for this visit.     Past Medical History:  Diagnosis Date  . Carpal tunnel syndrome   . Hypertension   . Sleep apnea     History reviewed. No pertinent surgical history.  Social History   Social History  . Marital status: Divorced    Spouse name: N/A  . Number of children: N/A  . Years of education: N/A   Occupational History  . Not on file.   Social History Main Topics  .  Smoking status: Former Research scientist (life sciences)  . Smokeless tobacco: Never Used  . Alcohol use No  . Drug use: No  . Sexual activity: Yes   Other Topics Concern  . Not on file   Social History Narrative  . No narrative on file     Vitals:   12/29/16 1411  BP: 104/84  Pulse: 95  SpO2: 95%  Weight: 172 lb (78 kg)  Height: _0  (1.676 m)    Wt Readings from Last 3 Encounters:  12/29/16 172 lb (78 kg)  12/21/16 177 lb (80.3 kg)  11/17/16 183 lb 9.6 oz (83.3 kg)     PHYSICAL EXAM General: NAD HEENT: Normal. Neck: No JVD, no thyromegaly. Lungs: Clear to auscultation bilaterally with normal respiratory effort. CV:  Nondisplaced PMI.  Regular rate and rhythm, normal S1/S2, no W9/O5,0/2 systolic murmur over RUSB and 2/4 holodiastolic murmur along LSB. No pretibial or periankle edema.   Abdomen: Soft, nontender, no distention.  Neurologic: Alert and oriented.  Psych: Anxious affect. Skin: Normal. Musculoskeletal: No gross deformities.    ECG: Most recent ECG reviewed.   Labs: Lab Results  Component Value Date/Time   K 3.8 12/21/2016 12:22 PM   BUN 11 12/21/2016 12:22 PM   CREATININE 0.98 12/21/2016 12:22 PM   CREATININE 0.98 03/11/2013 10:36 AM   ALT 45 12/21/2016 12:22 PM   TSH 1.653 10/21/2016 09:42 AM   TSH 2.371 03/11/2013 10:36 AM   HGB 13.8 12/21/2016 12:22 PM     Lipids: Lab Results  Component Value Date/Time   LDLCALC 72 03/11/2013 10:36 AM   CHOL 122 03/11/2013 10:36 AM   TRIG 93 03/11/2013 10:36 AM   HDL 31 (L) 03/11/2013 10:36 AM       ASSESSMENT AND PLAN: 1. Chest pain and exertional dyspnea: I will proceed with a nuclear myocardial perfusion imaging study to evaluate for ischemic heart disease (Lexiscan) given multiple hospital presentations for chest pain. Symptoms have both typical and atypical features. Continue aspirin and metoprolol.  2. Ascending aortic dilatation: Needs routine surveillance monitoring with CTA or MRA. Blood pressure is controlled.  3. Hypertension: Blood pressure is controlled on amlodipine. No changes to therapy.  4. Aortic valve disease: Mild to moderate aortic stenosis and moderate aortic regurgitation. I compared my physical exam findings to those reported by Dr. Harl Bowie in 09/2016, and there were no gross differences.  5. Low back pain: Lumbar x-rays and CT angiogram of the chest details reviewed above. Defer to PCP for pain management.   Disposition: Follow up 2-3 weeks with Dr. Harl Bowie  Time spent: 40 minutes, of which greater than 50% was spent reviewing symptoms, relevant blood tests and studies, and discussing management plan with the  patient.   Kate Sable, M.D., F.A.C.C.

## 2017-01-04 ENCOUNTER — Ambulatory Visit: Payer: Medicaid Other | Admitting: Urology

## 2017-01-06 ENCOUNTER — Telehealth: Payer: Self-pay | Admitting: Cardiology

## 2017-01-06 NOTE — Telephone Encounter (Signed)
Pre-cert Verification for the following procedure   Lexiscan scheduled for 01/16/17

## 2017-01-16 ENCOUNTER — Encounter (HOSPITAL_COMMUNITY): Payer: Medicaid Other

## 2017-01-16 ENCOUNTER — Inpatient Hospital Stay (HOSPITAL_COMMUNITY): Admission: RE | Admit: 2017-01-16 | Payer: Medicaid Other | Source: Ambulatory Visit

## 2017-01-23 ENCOUNTER — Encounter (HOSPITAL_COMMUNITY): Payer: Medicaid Other

## 2017-01-23 ENCOUNTER — Inpatient Hospital Stay (HOSPITAL_COMMUNITY): Admission: RE | Admit: 2017-01-23 | Payer: Medicaid Other | Source: Ambulatory Visit

## 2017-01-30 ENCOUNTER — Ambulatory Visit: Payer: Medicaid Other | Admitting: Cardiology

## 2017-01-31 ENCOUNTER — Encounter: Payer: Self-pay | Admitting: Cardiology

## 2017-03-26 DIAGNOSIS — I719 Aortic aneurysm of unspecified site, without rupture: Secondary | ICD-10-CM

## 2017-03-26 DIAGNOSIS — I7121 Aneurysm of the ascending aorta, without rupture: Secondary | ICD-10-CM

## 2017-03-26 DIAGNOSIS — I351 Nonrheumatic aortic (valve) insufficiency: Secondary | ICD-10-CM

## 2017-03-26 DIAGNOSIS — Q2543 Congenital aneurysm of aorta: Secondary | ICD-10-CM

## 2017-03-26 HISTORY — DX: Nonrheumatic aortic (valve) insufficiency: I35.1

## 2017-03-26 HISTORY — DX: Aortic aneurysm of unspecified site, without rupture: I71.9

## 2017-03-26 HISTORY — PX: AORTIC VALVE REPLACEMENT: SHX41

## 2017-03-26 HISTORY — DX: Congenital aneurysm of aorta: Q25.43

## 2017-03-26 HISTORY — DX: Aneurysm of the ascending aorta, without rupture: I71.21

## 2017-04-14 DIAGNOSIS — J449 Chronic obstructive pulmonary disease, unspecified: Secondary | ICD-10-CM | POA: Insufficient documentation

## 2017-04-14 DIAGNOSIS — G4733 Obstructive sleep apnea (adult) (pediatric): Secondary | ICD-10-CM | POA: Insufficient documentation

## 2017-04-14 DIAGNOSIS — I639 Cerebral infarction, unspecified: Secondary | ICD-10-CM | POA: Insufficient documentation

## 2017-04-14 DIAGNOSIS — F1411 Cocaine abuse, in remission: Secondary | ICD-10-CM | POA: Insufficient documentation

## 2017-04-14 DIAGNOSIS — Z87891 Personal history of nicotine dependence: Secondary | ICD-10-CM | POA: Insufficient documentation

## 2017-04-14 DIAGNOSIS — F1011 Alcohol abuse, in remission: Secondary | ICD-10-CM | POA: Insufficient documentation

## 2017-05-08 ENCOUNTER — Telehealth: Payer: Self-pay | Admitting: Cardiology

## 2017-05-08 ENCOUNTER — Telehealth: Payer: Self-pay

## 2017-05-08 NOTE — Telephone Encounter (Signed)
Kirk Oconnor called stating that he had open heart surgery two weeks ago at [facility] . States that they did Not give him anything for pain.  Patient states that he is in a lot of pain.  Please call [phone].

## 2017-05-08 NOTE — Telephone Encounter (Signed)
Returned call to patient.  Explained to patient that our doctors do not typically prescribe pain med.  He should contact doctors that did surgery.  Patient stated that this hospital was in [place].  Suggested he may try urgent care or [facility] evaluation in order to be prescribed pain meds.

## 2017-05-08 NOTE — Telephone Encounter (Signed)
Patient contacted office stating that he is post hospital discharge 11 days after having open heart surgery. He states he had this at [facility] and did not have a ride to return there for follow up. He states that he is in pain from the procedure and wants to come to office for follow up and pain meds. Spoke with Dr Ermalinda Memos who advised that patient will have to contact [facility] and advise on follow up. Patient verbalized understanding and will contact them

## 2017-05-08 NOTE — Telephone Encounter (Signed)
I would have him call facility/physicians that did the surgery since they were the last ones to evaluate him. Otherwise, could see his PCP for evaluation.

## 2017-05-08 NOTE — Telephone Encounter (Signed)
Noted  

## 2017-05-22 ENCOUNTER — Ambulatory Visit (INDEPENDENT_AMBULATORY_CARE_PROVIDER_SITE_OTHER): Payer: Medicaid Other | Admitting: Family Medicine

## 2017-05-22 ENCOUNTER — Encounter: Payer: Self-pay | Admitting: Family Medicine

## 2017-05-22 VITALS — BP 154/104 | HR 98 | Temp 98.1°F | Ht 66.0 in | Wt 156.8 lb

## 2017-05-22 DIAGNOSIS — G8918 Other acute postprocedural pain: Secondary | ICD-10-CM

## 2017-05-22 DIAGNOSIS — I10 Essential (primary) hypertension: Secondary | ICD-10-CM

## 2017-05-22 DIAGNOSIS — R21 Rash and other nonspecific skin eruption: Secondary | ICD-10-CM | POA: Diagnosis not present

## 2017-05-22 DIAGNOSIS — M545 Low back pain: Secondary | ICD-10-CM | POA: Diagnosis not present

## 2017-05-22 DIAGNOSIS — I5031 Acute diastolic (congestive) heart failure: Secondary | ICD-10-CM | POA: Diagnosis not present

## 2017-05-22 DIAGNOSIS — G8929 Other chronic pain: Secondary | ICD-10-CM

## 2017-05-22 MED ORDER — TRIAMCINOLONE ACETONIDE 0.1 % EX CREA: 1.0000 "application " | TOPICAL_CREAM | Freq: Two times a day (BID) | CUTANEOUS | 1 refills | Status: DC

## 2017-05-22 MED ORDER — CITALOPRAM HYDROBROMIDE 20 MG PO TABS: 20.0000 mg | ORAL_TABLET | Freq: Every day | ORAL | 2 refills | Status: DC

## 2017-05-22 MED ORDER — METOPROLOL TARTRATE 50 MG PO TABS: 50.0000 mg | ORAL_TABLET | Freq: Two times a day (BID) | ORAL | 3 refills | Status: DC

## 2017-05-22 MED ORDER — OXYCODONE-ACETAMINOPHEN 5-325 MG PO TABS: 1.0000 | ORAL_TABLET | Freq: Four times a day (QID) | ORAL | 0 refills | Status: DC | PRN

## 2017-05-22 NOTE — Patient Instructions (Signed)
Great to see you!  Come back in 2-4 weeks to re-check your blood pressure

## 2017-05-22 NOTE — Progress Notes (Signed)
   HPI  Patient presents today here follow-up from recent hospitalization.  Patient was admitted to [facility] and had an aortic valve replacement. He states that his breathing is about the same as before the surgery. He's reporting severe central chest and left chest tenderness with moving. Patient states this is consistent with previous postop pain He was treated with tramadol with not much effect, then hydrocodone 5 mg without much effect He denies any history of drug abuse.  Hypertension Patient reports good medication compliance Denies any worsening shortness of breath or chest pain since the surgery.  Diastolic CHF Appears euvolemic today, dyspnea is stable  Patient reports itchy rash on bilateral arms, neck, upper back  PMH: Smoking status noted ROS: Per HPI  Objective: BP (!) 154/104   Pulse 98   Temp 98.1 F (36.7 C) (Oral)   Ht '5\' 6"'$  (1.676 m)   Wt 156 lb 12.8 oz (71.1 kg)   BMI 25.31 kg/m  Gen: NAD, alert, cooperative with exam HEENT: NCAT CV: RRR, good S1/S2 Chest wall: Well-healing scar Resp: CTABL, no wheezes, non-labored Ext: No edema, warm Neuro: Alert and oriented, No gross deficits  Assessment and plan:  # Postop pain Patient with recent aortic valve replacement Given 7 days' worth of pain medication Recommended making the medication stretch and discussed that I will not be able to treat him for this pain long-term, if he returns for more we will titrate down over a period of 6 weeks.  # Hypertension Blood pressure and pulse uncontrolled given his history Titrate metoprolol 50 mg twice daily Continue amlodipine  # diastolic CHF Euvolemic, continue beta blocker and aggressive hypertension control  Rash Likely eczema or arthropod bite, Kenalog cream sent  [facility] controlled substance database reviewed with no red flags  Patient with chronic back pain, will pursue pain referral.  Orders Placed This Encounter  Procedures  .  CMP14+EGFR  . CBC with Differential/Platelet    Meds ordered this encounter  Medications  . DISCONTD: HYDROcodone-acetaminophen (NORCO/VICODIN) 5-325 MG tablet    Sig: Take by mouth.  . oxyCODONE-acetaminophen (ROXICET) 5-325 MG tablet    Sig: Take 1 tablet by mouth every 6 (six) hours as needed for severe pain.    Dispense:  28 tablet    Refill:  0  . metoprolol tartrate (LOPRESSOR) 50 MG tablet    Sig: Take 1 tablet (50 mg total) by mouth 2 (two) times daily.    Dispense:  60 tablet    Refill:  3  . triamcinolone cream (KENALOG) 0.1 %    Sig: Apply 1 application topically 2 (two) times daily.    Dispense:  60 g    Refill:  1  . citalopram (CELEXA) 20 MG tablet    Sig: Take 1 tablet (20 mg total) by mouth daily.    Dispense:  30 tablet    Refill:  R[place] Blanco, MD [facility] 05/22/2017, 5:04 PM

## 2017-05-23 ENCOUNTER — Telehealth: Payer: Self-pay | Admitting: Family Medicine

## 2017-05-23 NOTE — Telephone Encounter (Signed)
Info given to [facility] and claim paid

## 2017-06-12 ENCOUNTER — Ambulatory Visit (INDEPENDENT_AMBULATORY_CARE_PROVIDER_SITE_OTHER): Payer: Medicaid Other | Admitting: Family Medicine

## 2017-06-12 ENCOUNTER — Encounter (HOSPITAL_COMMUNITY): Payer: Self-pay | Admitting: *Deleted

## 2017-06-12 ENCOUNTER — Emergency Department (HOSPITAL_COMMUNITY): Payer: Medicaid Other

## 2017-06-12 ENCOUNTER — Emergency Department (HOSPITAL_COMMUNITY)
Admission: EM | Admit: 2017-06-12 | Discharge: 2017-06-12 | Disposition: A | Discharge status: Home / Self Care | Payer: Medicaid Other | Attending: Emergency Medicine | Admitting: Emergency Medicine

## 2017-06-12 VITALS — BP 167/124 | HR 117 | Temp 97.8°F

## 2017-06-12 DIAGNOSIS — I5031 Acute diastolic (congestive) heart failure: Secondary | ICD-10-CM | POA: Diagnosis not present

## 2017-06-12 DIAGNOSIS — Z79899 Other long term (current) drug therapy: Secondary | ICD-10-CM | POA: Diagnosis not present

## 2017-06-12 DIAGNOSIS — R079 Chest pain, unspecified: Secondary | ICD-10-CM

## 2017-06-12 DIAGNOSIS — Z952 Presence of prosthetic heart valve: Secondary | ICD-10-CM | POA: Insufficient documentation

## 2017-06-12 DIAGNOSIS — I110 Hypertensive heart disease with heart failure: Secondary | ICD-10-CM | POA: Insufficient documentation

## 2017-06-12 DIAGNOSIS — Z7982 Long term (current) use of aspirin: Secondary | ICD-10-CM | POA: Diagnosis not present

## 2017-06-12 DIAGNOSIS — I5032 Chronic diastolic (congestive) heart failure: Secondary | ICD-10-CM | POA: Insufficient documentation

## 2017-06-12 DIAGNOSIS — Z87891 Personal history of nicotine dependence: Secondary | ICD-10-CM | POA: Diagnosis not present

## 2017-06-12 LAB — BASIC METABOLIC PANEL WITH GFR
Anion gap: 9 (ref 5–15)
BUN: 8 mg/dL (ref 6–20)
CO2: 23 mmol/L (ref 22–32)
Calcium: 8.9 mg/dL (ref 8.9–10.3)
Chloride: 105 mmol/L (ref 101–111)
Creatinine, Ser: 1.06 mg/dL (ref 0.61–1.24)
GFR calc Af Amer: >60 mL/min (ref >60)
GFR calc non Af Amer: >60 mL/min (ref >60)
Glucose, Bld: 151 mg/dL — ABNORMAL HIGH (ref 65–99)
Potassium: 4 mmol/L (ref 3.5–5.1)
Sodium: 137 mmol/L (ref 135–145)

## 2017-06-12 LAB — CBC
HCT: 41.3 % (ref 39.0–52.0)
Hemoglobin: 13.5 g/dL (ref 13.0–17.0)
MCH: 30.1 pg (ref 26.0–34.0)
MCHC: 32.7 g/dL (ref 30.0–36.0)
MCV: 92.2 fL (ref 78.0–100.0)
Platelets: 168 K/uL (ref 150–400)
RBC: 4.48 MIL/uL (ref 4.22–5.81)
RDW: 16.5 % — ABNORMAL HIGH (ref 11.5–15.5)
WBC: 5.4 K/uL (ref 4.0–10.5)

## 2017-06-12 LAB — TROPONIN I: Troponin I: <0.03 ng/mL (ref <0.03)

## 2017-06-12 MED ORDER — HYDROCODONE-ACETAMINOPHEN 5-325 MG PO TABS: 1.0000 | ORAL_TABLET | ORAL | 0 refills | Status: DC | PRN

## 2017-06-12 MED ORDER — NITROGLYCERIN 0.4 MG SL SUBL
0.4000 mg | SUBLINGUAL_TABLET | Freq: Once | SUBLINGUAL | Status: AC
  Administered 2017-06-12: 0.4 mg via SUBLINGUAL
  Filled 2017-06-12: qty 1

## 2017-06-12 NOTE — ED Triage Notes (Signed)
Pt comes in from [facility] for chest pain starting 1 week ago. Pt was given 1 nitro in route and has had no relief.

## 2017-06-12 NOTE — ED Notes (Signed)
Pt given meal tray per MD Adriana Simas at this time.

## 2017-06-12 NOTE — ED Notes (Signed)
Pt complaining that staff "locked him in a room with no tv". Made pt aware that the door is not locked and pt can get up at any time. Television is not working and staff have been notified. Pt stating "I need to get out of here". MD Adriana Simas notified.

## 2017-06-12 NOTE — ED Provider Notes (Signed)
[facility]-EM[facility]Y DEPT Provider Note   CSN: [id number] Arrival date & time: 06/12/17  1137     History   Chief Complaint Chief Complaint  Patient presents with  . Chest Pain    HPI Kirk Oconnor is a 61 y.o. male.  Intermittent chest pain for 1 week c no relation to activity. Status post aortic valve replacement  7 weeks ago at [facility] in [place]. Pain is described as knifelike and lasts for a short period of time. He is using oxycodone for his pain but has run out of it. He currently does not have a cardiologist.  No dyspnea, diaphoresis, nausea.      Past Medical History:  Diagnosis Date  . Carpal tunnel syndrome   . Hypertension   . Sleep apnea     Patient Active Problem List   Diagnosis Date Noted  . BPH (benign prostatic hyperplasia) 10/22/2016  . GERD (gastroesophageal reflux disease) 10/22/2016  . CAP (community acquired pneumonia) 10/22/2016  . Chest pain 10/21/2016  . Aortic root dilation (HCC) 10/21/2016  . Acute diastolic CHF (congestive heart failure) (HCC) 10/21/2016  . Essential hypertension, benign 03/27/2013  . Hepatitis C 03/27/2013  . Snoring 03/27/2013    Past Surgical History:  Procedure Laterality Date  . AORTIC VALVE REPLACEMENT         Home Medications    Prior to Admission medications   Medication Sig Start Date End Date Taking? Authorizing Provider  albuterol (PROVENTIL HFA;VENTOLIN HFA) 108 (90 Base) MCG/ACT inhaler Inhale 2 puffs into the lungs every 6 (six) hours as needed for wheezing or shortness of breath. 10/23/16  Yes Erick Blinks, MD  amLODipine (NORVASC) 10 MG tablet Take 1 tablet (10 mg total) by mouth daily. 11/21/16  Yes Elenora Gamma, MD  aspirin EC 81 MG EC tablet Take 1 tablet (81 mg total) by mouth daily. 10/24/16  Yes Erick Blinks, MD  citalopram (CELEXA) 20 MG tablet Take 1 tablet (20 mg total) by mouth daily. 05/22/17  Yes Elenora Gamma, MD  finasteride (PROSCAR) 5 MG tablet Take 1 tablet (5 mg  total) by mouth daily. 11/21/16  Yes Elenora Gamma, MD  hydrocortisone cream 1 % Apply to affected area 2 times daily 12/21/16  Yes Cathren Laine, MD  hydrOXYzine (ATARAX/VISTARIL) 50 MG tablet Take 50 mg by mouth 2 (two) times daily.   Yes [provider]  metoprolol tartrate (LOPRESSOR) 50 MG tablet Take 1 tablet (50 mg total) by mouth 2 (two) times daily. 05/22/17  Yes Elenora Gamma, MD  oxyCODONE-acetaminophen (ROXICET) 5-325 MG tablet Take 1 tablet by mouth every 6 (six) hours as needed for severe pain. 05/22/17  Yes Elenora Gamma, MD  pantoprazole (PROTONIX) 40 MG tablet Take 1 tablet (40 mg total) by mouth daily. 11/21/16  Yes Elenora Gamma, MD  potassium chloride (K-DUR) 10 MEQ tablet Take 1 tablet (10 mEq total) by mouth daily. 11/21/16  Yes Elenora Gamma, MD  triamcinolone cream (KENALOG) 0.1 % Apply 1 application topically 2 (two) times daily. 05/22/17  Yes Elenora Gamma, MD  HYDROcodone-acetaminophen (NORCO/VICODIN) 5-325 MG tablet Take 1 tablet by mouth every 4 (four) hours as needed. 06/12/17   Donnetta Hutching, MD    Family History Family History  Problem Relation Age of Onset  . Diabetes Mother   . Heart attack Father     Social History Social History  Substance Use Topics  . Smoking status: Former Games developer  . Smokeless tobacco: Never Used  .  Alcohol use No     Allergies   Patient has no known allergies.   Review of Systems Review of Systems  All other systems reviewed and are negative.    Physical Exam Updated Vital Signs BP (!) 133/121   Pulse 98   Temp 98 F (36.7 C) (Oral)   Resp 19   Ht 5\' 6"  (1.676 m)   Wt 72.6 kg (160 lb)   SpO2 94%   BMI 25.82 kg/m   Physical Exam  Constitutional: He is oriented to person, place, and time. He appears well-developed and well-nourished.  HENT:  Head: Normocephalic and atraumatic.  Eyes: Conjunctivae are normal.  Neck: Neck supple.  Cardiovascular: Normal rate and regular rhythm.     Pulmonary/Chest: Effort normal and breath sounds normal.  Abdominal: Soft. Bowel sounds are normal.  Musculoskeletal: Normal range of motion.  Neurological: He is alert and oriented to person, place, and time.  Skin: Skin is warm and dry.  Psychiatric: He has a normal mood and affect. His behavior is normal.  Nursing note and vitals reviewed.    ED Treatments / Results  Labs (all labs ordered are listed, but only abnormal results are displayed) Labs Reviewed  BASIC METABOLIC PANEL - Abnormal; Notable for the following:       Result Value   Glucose, Bld 151 (*)    All other components within normal limits  CBC - Abnormal; Notable for the following:    RDW 16.5 (*)    All other components within normal limits  TROPONIN I    EKG  EKG Interpretation  Date/Time:  Monday June 12 2017 11:43:18 EDT Ventricular Rate:  91 PR Interval:    QRS Duration: 107 QT Interval:  366 QTC Calculation: 451 R Axis:   -73 Text Interpretation:  Sinus rhythm Probable left atrial enlargement Abnormal R-wave progression, late transition LVH with secondary repolarization abnormality Inferior infarct, old Confirmed by Donnetta Hutching ([id number]) on 06/12/2017 1:51:34 PM Also confirmed by Donnetta Hutching ([id number])  on 06/12/2017 1:52:35 PM       Radiology Dg Chest 2 View  Result Date: 06/12/2017 CLINICAL DATA:  Chest pain EXAM: CHEST  2 VIEW COMPARISON:  12/21/2016 chest radiograph. FINDINGS: Intact sternotomy wires. Aortic valve prosthesis is in place. Stable cardiomediastinal silhouette with top-normal heart size. No pneumothorax. No pleural effusion. Lungs appear clear, with no acute consolidative airspace disease and no pulmonary edema. IMPRESSION: No active cardiopulmonary disease. Electronically Signed   By: Delbert Phenix M.D.   On: 06/12/2017 12:16    Procedures Procedures (including critical care time)  Medications Ordered in ED Medications  nitroGLYCERIN (NITROSTAT) SL tablet 0.4 mg (0.4 mg  Sublingual Given 06/12/17 1246)     Initial Impression / Assessment and Plan / ED Course  I have reviewed the triage vital signs and the nursing notes.  Pertinent labs & imaging results that were available during my care of the patient were reviewed by me and considered in my medical decision making (see chart for details).     Screening tests including EKG, chest x-ray, troponin all negative for acute findings. EKG does show some T-wave inversion in V5 and 6.Patient is hemodynamically stable. Will refer to cardiology.  Final Clinical Impressions(s) / ED Diagnoses   Final diagnoses:  Chest pain, unspecified type    New Prescriptions Discharge Medication List as of 06/12/2017  2:53 PM    START taking these medications   Details  HYDROcodone-acetaminophen (NORCO/VICODIN) 5-325 MG tablet Take 1 tablet by mouth  every 4 (four) hours as needed., Starting Mon 06/12/2017, Print         Donnetta Hutching, MD 06/12/17 [phone]

## 2017-06-12 NOTE — Progress Notes (Signed)
   HPI  Patient presents today . Chest pain.  Patient has recent history of aortic valve replacement. He states over the last 5 days he's had off and on left-sided chest pain described as squeezing heavy type pain that comes and goes. He states that it's steadily been worsening since running out of Percocet.  Patient was using Percocet for incisional pain, however this pain is distinctly different than incisional pain.  Patient states that he can tolerate exertion, however several minutes afterwards he developed the left-sided chest pain which resolves after a few minutes.  He reports taking all of his blood pressure medications today.  He has not taken nitroglycerin or aspirin today  PMH: Smoking status noted ROS: Per HPI  Objective: BP (!) 167/124 (BP Location: Left Arm, Patient Position: Sitting, Cuff Size: Normal)   Pulse (!) 117   Temp 97.8 F (36.6 C) (Oral)  Gen: NAD, alert, cooperative with exam HEENT: NCAT Chest wall- No reproducible tenderness CV: tachy but regular rhythm Resp: CTABL, no wheezes, non-labored Ext: No edema, warm Neuro: Alert and oriented, No gross deficits  ekg- Tachy, Stable L fascicular block, new flipped T waves V5, V6  Assessment and plan:  # Chest pain, hypertensive emergency Patient with chest pain, uncontrolled hypertension, and history of recent aortic valve replacement Patient very elevated blood pressure today I declined his request for narcotics. His symptoms are not distinctly exertional, however given his history I believe is necessary to work him up in the emergency room. I appreciate their recommendations and management, transferred to the emergency room via EMS  Oxygen given via nasal cannula, 325 of aspirin, nitroglycerin all given before EMS arrived.    Orders Placed This Encounter  Procedures  . EKG 12-Lead     [name], MD [facility] 06/12/2017, 11:00 AM

## 2017-06-12 NOTE — Discharge Instructions (Signed)
Tests show no life-threatening condition. Prescription for pain medication. Recommend follow-up with cardiology. Phone number given.

## 2017-06-14 ENCOUNTER — Ambulatory Visit (INDEPENDENT_AMBULATORY_CARE_PROVIDER_SITE_OTHER): Payer: Medicaid Other | Admitting: Family Medicine

## 2017-06-14 ENCOUNTER — Encounter: Payer: Self-pay | Admitting: Family Medicine

## 2017-06-14 VITALS — BP 131/89 | HR 53 | Temp 97.1°F | Resp 18 | Ht 66.0 in | Wt 153.4 lb

## 2017-06-14 DIAGNOSIS — R0602 Shortness of breath: Secondary | ICD-10-CM

## 2017-06-14 DIAGNOSIS — R0789 Other chest pain: Secondary | ICD-10-CM

## 2017-06-14 DIAGNOSIS — R000 Tachycardia, unspecified: Secondary | ICD-10-CM | POA: Diagnosis not present

## 2017-06-14 MED ORDER — VERAPAMIL HCL ER 120 MG PO TBCR: 120.0000 mg | EXTENDED_RELEASE_TABLET | Freq: Two times a day (BID) | ORAL | 0 refills | Status: DC

## 2017-06-14 MED ORDER — TRIAMCINOLONE ACETONIDE 0.1 % EX CREA: 1.0000 "application " | TOPICAL_CREAM | Freq: Two times a day (BID) | CUTANEOUS | 1 refills | Status: DC

## 2017-06-14 MED ORDER — OXYCODONE-ACETAMINOPHEN 5-325 MG PO TABS: 1.0000 | ORAL_TABLET | Freq: Four times a day (QID) | ORAL | 0 refills | Status: DC | PRN

## 2017-06-14 NOTE — Progress Notes (Signed)
Subjective:  Patient ID: Kirk Oconnor, male    DOB: 1956-03-05  Age: 61 y.o. MRN: [id number]  CC: Shortness of Breath (worse this AM with dizziness)   HPI Kirk Oconnor presents for Patient comes in complaining of shortness of breath today. He also reports anterior chest pain. It is not episodic. And present for several hours. He relates this back to his recent surgical valve repair.He says he can feel his heart beat hard. He is concerned about elevated blood pressure.Additionally today he has had a rapid heartbeat. Patient was placed on oxygen and given a single nitroglycerin 0.4 sublingually with resolution of the tachycardia and the chest pain.Patient had recently been evaluated at the Hospital and sent there from here by ambulance. He specifically said he was not willing to do that today.He also states that he feels that his problem is related to surgical repair and he needs pain medicines. He was given some hydrocodone that gave him no relief at all. It was like taking the Tylenol he stated. He needs something stronger for the postop pain in his chest wall.Additionally has some rash on his neck that he would like treated. He thought Dr. Wendi Oconnor was can give him something but as it turned out Dr. Wendi Oconnor had sent in a prescription for him. He did not pick that up due to the misunderstanding. E.D report including EKG reviewed. Troponins were negative. Pt DCed stable hemodynamically.  Depression screen [facility] 2/9 05/22/2017 12/21/2016 11/17/2016  Decreased Interest 0 3 3  Down, Depressed, Hopeless '3 3 3  ' PHQ - 2 Score '3 6 6  ' Altered sleeping '3 3 3  ' Tired, decreased energy '3 3 3  ' Change in appetite '3 3 3  ' Feeling bad or failure about yourself  '3 3 3  ' Trouble concentrating '3 3 3  ' Moving slowly or fidgety/restless '3 3 3  ' Suicidal thoughts 0 3 3  PHQ-9 Score '21 27 27  ' Difficult doing work/chores - Somewhat difficult -    History Kirk Oconnor has a past medical history of Carpal tunnel syndrome;  Hypertension; and Sleep apnea.   He has a past surgical history that includes Aortic valve replacement.   His family history includes Diabetes in his mother; Heart attack in his father.He reports that he has quit smoking. He has never used smokeless tobacco. He reports that he does not drink alcohol or use drugs.    ROS Review of Systems  Constitutional: Negative for chills, diaphoresis, fever and unexpected weight change.  HENT: Negative for congestion and trouble swallowing.   Eyes: Negative for visual disturbance.  Respiratory: Positive for shortness of breath and wheezing. Negative for cough.   Cardiovascular: Positive for chest pain.  Gastrointestinal: Negative for abdominal pain, constipation and diarrhea.  Genitourinary: Negative for dysuria and flank pain.  Musculoskeletal: Negative for arthralgias and joint swelling.  Skin: Negative for rash.  Neurological: Positive for headaches. Negative for dizziness.  Psychiatric/Behavioral: Positive for dysphoric mood. Negative for sleep disturbance.    Objective:  BP 131/89   Pulse (!) 53   Temp (!) 97.1 F (36.2 C) (Oral)   Resp 18   Ht '5\' 6"'  (1.676 m)   Wt 153 lb 6.4 oz (69.6 kg)   SpO2 98%   BMI 24.76 kg/m   BP Readings from Last 3 Encounters:  06/14/17 131/89  06/12/17 (!) 133/121  06/12/17 (!) 167/124    Wt Readings from Last 3 Encounters:  06/14/17 153 lb 6.4 oz (69.6 kg)  06/12/17 160 lb (  72.6 kg)  05/22/17 156 lb 12.8 oz (71.1 kg)     Physical Exam  Constitutional: He is oriented to person, place, and time. He appears well-developed and well-nourished. No distress.  Patient is laying on the exam table with cardiac monitoring ongoing. The monitor showing sinus tachycardia at 116. Patient has no complaints except that he had been short of breath earlier. He is not currently short of breath. EKG performed shows elevated ST segments in the anterior leads.  HENT:  Head: Normocephalic and atraumatic.  Right Ear:  External ear normal.  Left Ear: External ear normal.  Nose: Nose normal.  Mouth/Throat: Oropharynx is clear and moist.  Eyes: Pupils are equal, round, and reactive to light. Conjunctivae and EOM are normal.  Neck: Normal range of motion. Neck supple. No thyromegaly present.  Cardiovascular: Regular rhythm and normal heart sounds.  Exam reveals no gallop and no friction rub.   No murmur heard. Pulmonary/Chest: Effort normal. No respiratory distress. He has wheezes (Diffuse scattered with good exchange). He has no rales.  Abdominal: Soft. Bowel sounds are normal. He exhibits no distension. There is no tenderness.  Lymphadenopathy:    He has no cervical adenopathy.  Neurological: He is alert and oriented to person, place, and time. He has normal reflexes.  Skin: Skin is warm and dry.  Psychiatric: He has a normal mood and affect. His behavior is normal. Judgment and thought content normal.      Assessment & Plan:   Kirk Oconnor was seen today for shortness of breath.  Diagnoses and all orders for this visit:  Tachycardia  SOB (shortness of breath) -     EKG 12-Lead -     CBC with Differential/Platelet -     CMP14+EGFR  Atypical chest pain  Other orders -     verapamil (CALAN-SR) 120 MG CR tablet; Take 1 tablet (120 mg total) by mouth 2 (two) times daily. -     oxyCODONE-acetaminophen (ROXICET) 5-325 MG tablet; Take 1 tablet by mouth every 6 (six) hours as needed for severe pain. -     triamcinolone cream (KENALOG) 0.1 %; Apply 1 application topically 2 (two) times daily.       I have discontinued Kirk Oconnor's oxyCODONE-acetaminophen. I am also having him start on verapamil and oxyCODONE-acetaminophen. Additionally, I am having him maintain his hydrOXYzine, aspirin, albuterol, amLODipine, pantoprazole, potassium chloride, finasteride, hydrocortisone cream, metoprolol tartrate, citalopram, HYDROcodone-acetaminophen, and triamcinolone cream.  Allergies as of 06/14/2017   No Known  Allergies     Medication List       Accurate as of 06/14/17  4:31 PM. Always use your most recent med list.          albuterol 108 (90 Base) MCG/ACT inhaler Commonly known as:  PROVENTIL HFA;VENTOLIN HFA Inhale 2 puffs into the lungs every 6 (six) hours as needed for wheezing or shortness of breath.   amLODipine 10 MG tablet Commonly known as:  NORVASC Take 1 tablet (10 mg total) by mouth daily.   aspirin 81 MG EC tablet Take 1 tablet (81 mg total) by mouth daily.   citalopram 20 MG tablet Commonly known as:  CELEXA Take 1 tablet (20 mg total) by mouth daily.   finasteride 5 MG tablet Commonly known as:  PROSCAR Take 1 tablet (5 mg total) by mouth daily.   HYDROcodone-acetaminophen 5-325 MG tablet Commonly known as:  NORCO/VICODIN Take 1 tablet by mouth every 4 (four) hours as needed.   hydrocortisone cream 1 % Apply to  affected area 2 times daily   hydrOXYzine 50 MG tablet Commonly known as:  ATARAX/VISTARIL Take 50 mg by mouth 2 (two) times daily.   metoprolol tartrate 50 MG tablet Commonly known as:  LOPRESSOR Take 1 tablet (50 mg total) by mouth 2 (two) times daily.   oxyCODONE-acetaminophen 5-325 MG tablet Commonly known as:  ROXICET Take 1 tablet by mouth every 6 (six) hours as needed for severe pain.   pantoprazole 40 MG tablet Commonly known as:  PROTONIX Take 1 tablet (40 mg total) by mouth daily.   potassium chloride 10 MEQ tablet Commonly known as:  K-DUR Take 1 tablet (10 mEq total) by mouth daily.   triamcinolone cream 0.1 % Commonly known as:  KENALOG Apply 1 application topically 2 (two) times daily.   verapamil 120 MG CR tablet Commonly known as:  CALAN-SR Take 1 tablet (120 mg total) by mouth 2 (two) times daily.            Discharge Care Instructions        Start     Ordered   06/14/17 0000  EKG 12-Lead     06/14/17 1057   06/14/17 0000  verapamil (CALAN-SR) 120 MG CR tablet  2 times daily     06/14/17 1146   06/14/17  0000  oxyCODONE-acetaminophen (ROXICET) 5-325 MG tablet  Every 6 hours PRN     06/14/17 1146   06/14/17 0000  triamcinolone cream (KENALOG) 0.1 %  2 times daily     06/14/17 1146   06/14/17 0000  CBC with Differential/Platelet     06/14/17 1153   06/14/17 0000  CMP14+EGFR     06/14/17 1153     Patient stringently refused transfer to emergency department. He stated that he was willing to have blood work and try a medicine but that was all. 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 And seen with left ventricular hypertrophy. Of note is that they are somewhat more pronounced than on EKG done 2 days ago. There is also possibility of this being related to postop changes as well. Since he was opposed to ER transfer he was given nitroglycerin and a prescription for verapamil and which should help with the heart rate as well as antianginal effect. He should follow up with Dr. Wendi Oconnor over the next several days and as needed for recurrent chest pain and shortness of breath.  Follow-up: Return in about 5 days (around 06/19/2017), or if symptoms worsen or fail to improve, for tachcycardia.  Claretta Fraise, M.D.

## 2017-06-15 LAB — CMP14+EGFR
ALBUMIN: 3.7 g/dL (ref 3.5–5.5)
ALT: 28 IU/L (ref 0–44)
AST: 30 IU/L (ref 0–40)
Albumin/Globulin Ratio: 1 — ABNORMAL LOW (ref 1.2–2.2)
Alkaline Phosphatase: 102 IU/L (ref 39–117)
BUN / CREAT RATIO: 11 (ref 9–20)
BUN: 13 mg/dL (ref 6–24)
Bilirubin Total: 0.4 mg/dL (ref 0.0–1.2)
CO2: 20 mmol/L (ref 20–29)
Calcium: 9.4 mg/dL (ref 8.7–10.2)
Chloride: 109 mmol/L — ABNORMAL HIGH (ref 96–106)
Creatinine, Ser: 1.14 mg/dL (ref 0.76–1.27)
GFR, EST AFRICAN AMERICAN: 81 mL/min/{1.73_m2} (ref >59)
GFR, EST NON AFRICAN AMERICAN: 70 mL/min/{1.73_m2} (ref >59)
GLOBULIN, TOTAL: 3.8 g/dL (ref 1.5–4.5)
GLUCOSE: 167 mg/dL — AB (ref 65–99)
POTASSIUM: 4.4 mmol/L (ref 3.5–5.2)
SODIUM: 143 mmol/L (ref 134–144)
TOTAL PROTEIN: 7.5 g/dL (ref 6.0–8.5)

## 2017-06-15 LAB — CBC WITH DIFFERENTIAL/PLATELET
BASOS: 0 %
Basophils Absolute: 0 10*3/uL (ref 0.0–0.2)
EOS (ABSOLUTE): 0.1 10*3/uL (ref 0.0–0.4)
EOS: 1 %
HEMATOCRIT: 43 % (ref 37.5–51.0)
HEMOGLOBIN: 13.7 g/dL (ref 13.0–17.7)
IMMATURE GRANS (ABS): 0 10*3/uL (ref 0.0–0.1)
Immature Granulocytes: 0 %
LYMPHS ABS: 2.7 10*3/uL (ref 0.7–3.1)
LYMPHS: 47 %
MCH: 30 pg (ref 26.6–33.0)
MCHC: 31.9 g/dL (ref 31.5–35.7)
MCV: 94 fL (ref 79–97)
MONOCYTES: 6 %
Monocytes Absolute: 0.4 10*3/uL (ref 0.1–0.9)
NEUTROS ABS: 2.7 10*3/uL (ref 1.4–7.0)
Neutrophils: 46 %
Platelets: 202 10*3/uL (ref 150–379)
RBC: 4.56 x10E6/uL (ref 4.14–5.80)
RDW: 15.9 % — ABNORMAL HIGH (ref 12.3–15.4)
WBC: 5.9 10*3/uL (ref 3.4–10.8)

## 2017-06-20 ENCOUNTER — Emergency Department (HOSPITAL_COMMUNITY): Payer: Medicaid Other

## 2017-06-20 ENCOUNTER — Telehealth: Payer: Self-pay

## 2017-06-20 ENCOUNTER — Encounter: Payer: Self-pay | Admitting: Family Medicine

## 2017-06-20 ENCOUNTER — Emergency Department (HOSPITAL_COMMUNITY)
Admission: EM | Admit: 2017-06-20 | Discharge: 2017-06-20 | Disposition: A | Discharge status: Home / Self Care | Payer: Medicaid Other | Attending: Emergency Medicine | Admitting: Emergency Medicine

## 2017-06-20 ENCOUNTER — Ambulatory Visit (INDEPENDENT_AMBULATORY_CARE_PROVIDER_SITE_OTHER): Payer: Medicaid Other | Admitting: Family Medicine

## 2017-06-20 ENCOUNTER — Ambulatory Visit: Payer: Self-pay | Admitting: Cardiology

## 2017-06-20 ENCOUNTER — Encounter (HOSPITAL_COMMUNITY): Payer: Self-pay | Admitting: Cardiology

## 2017-06-20 ENCOUNTER — Telehealth: Payer: Self-pay | Admitting: Family Medicine

## 2017-06-20 VITALS — BP 163/107 | HR 110 | Temp 97.1°F | Ht 66.0 in | Wt 156.6 lb

## 2017-06-20 DIAGNOSIS — R079 Chest pain, unspecified: Secondary | ICD-10-CM

## 2017-06-20 DIAGNOSIS — Y92410 Unspecified street and highway as the place of occurrence of the external cause: Secondary | ICD-10-CM | POA: Insufficient documentation

## 2017-06-20 DIAGNOSIS — Y999 Unspecified external cause status: Secondary | ICD-10-CM | POA: Insufficient documentation

## 2017-06-20 DIAGNOSIS — Z79899 Other long term (current) drug therapy: Secondary | ICD-10-CM | POA: Insufficient documentation

## 2017-06-20 DIAGNOSIS — Z7982 Long term (current) use of aspirin: Secondary | ICD-10-CM | POA: Diagnosis not present

## 2017-06-20 DIAGNOSIS — I10 Essential (primary) hypertension: Secondary | ICD-10-CM

## 2017-06-20 DIAGNOSIS — S299XXA Unspecified injury of thorax, initial encounter: Secondary | ICD-10-CM | POA: Diagnosis present

## 2017-06-20 DIAGNOSIS — Z87891 Personal history of nicotine dependence: Secondary | ICD-10-CM | POA: Diagnosis not present

## 2017-06-20 DIAGNOSIS — Y939 Activity, unspecified: Secondary | ICD-10-CM | POA: Diagnosis not present

## 2017-06-20 DIAGNOSIS — S2241XA Multiple fractures of ribs, right side, initial encounter for closed fracture: Secondary | ICD-10-CM | POA: Insufficient documentation

## 2017-06-20 LAB — SPECIMEN STATUS REPORT

## 2017-06-20 LAB — HGB A1C W/O EAG: Hgb A1c MFr Bld: 5.3 % (ref 4.8–5.6)

## 2017-06-20 MED ORDER — METOPROLOL TARTRATE 50 MG PO TABS: 50.0000 mg | ORAL_TABLET | Freq: Two times a day (BID) | ORAL | 11 refills | Status: DC

## 2017-06-20 MED ORDER — OXYCODONE-ACETAMINOPHEN 5-325 MG PO TABS: 1.0000 | ORAL_TABLET | ORAL | 0 refills | Status: DC | PRN

## 2017-06-20 MED ORDER — VERAPAMIL HCL ER 120 MG PO TBCR: 120.0000 mg | EXTENDED_RELEASE_TABLET | Freq: Two times a day (BID) | ORAL | 11 refills | Status: DC

## 2017-06-20 MED ORDER — IOPAMIDOL (ISOVUE-300) INJECTION 61%
75.0000 mL | Freq: Once | INTRAVENOUS | Status: AC | PRN
  Administered 2017-06-20: 75 mL via INTRAVENOUS

## 2017-06-20 MED ORDER — OXYCODONE-ACETAMINOPHEN 5-325 MG PO TABS
2.0000 | ORAL_TABLET | Freq: Once | ORAL | Status: AC
  Administered 2017-06-20: 2 via ORAL
  Filled 2017-06-20: qty 2

## 2017-06-20 MED ORDER — AMLODIPINE BESYLATE 10 MG PO TABS: 10.0000 mg | ORAL_TABLET | Freq: Every day | ORAL | 11 refills | Status: DC
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
## 2017-06-20 NOTE — ED Triage Notes (Signed)
EMS gave 2 sl NTG and 324 mg aspirin .

## 2017-06-20 NOTE — Patient Instructions (Signed)
Great to see you!  I am recommending that you restart your medications as soon as possible. If you re only able to afford 1 today then I would recommend metoprolol.   We will try to help you with a cardiologist appointment.

## 2017-06-20 NOTE — Telephone Encounter (Signed)
Mr. Kirk Oconnor was sent today and thought he was going to get a refill for his Hydrocodone.  He is at [facility] pharmacy now waiting.  I explained to him that I would have to speak with you about this and that this medication cannot be called in to [facility], he must pick up script.  Please advise.
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
## 2017-06-20 NOTE — Telephone Encounter (Signed)
Patient was seen today. Did not ask for refill at visit. Please advise
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
## 2017-06-20 NOTE — Progress Notes (Deleted)
Clinical Summary Mr. Kirk Oconnor is a 61 y.o.male  1. Atypical chest pain - admitted Jan 2018 with atypical chest pain. Worst with position and deep breathing, left chest wall tender to palpation. 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 Carpal tunnel syndrome   . Hypertension   . Sleep apnea      No Known Allergies   Current Outpatient Prescriptions  Medication Sig Dispense Refill  . albuterol (PROVENTIL HFA;VENTOLIN HFA) 108 (90 Base) MCG/ACT inhaler Inhale 2 puffs into the lungs every 6 (six) hours as needed for wheezing or shortness of breath. 1 Inhaler 2  . amLODipine (NORVASC) 10 MG tablet Take 1 tablet (10 mg total) by mouth daily. 30 tablet 2  . aspirin EC 81 MG EC tablet Take 1 tablet (81 mg total) by mouth daily. 30 tablet 0  . citalopram (CELEXA) 20 MG tablet Take 1 tablet (20 mg total) by mouth daily. 30 tablet 2  . finasteride (PROSCAR) 5 MG tablet Take 1 tablet (5 mg total) by mouth daily. 30 tablet 2  . HYDROcodone-acetaminophen (NORCO/VICODIN) 5-325 MG tablet Take 1 tablet by mouth every 4 (four) hours as needed. 15 tablet 0  . hydrocortisone cream 1 % Apply to affected area 2 times daily 30 g 0  . hydrOXYzine (ATARAX/VISTARIL) 50 MG tablet Take 50 mg by mouth 2 (two) times daily.    . metoprolol tartrate (LOPRESSOR) 50 MG tablet Take 1 tablet (50 mg total) by mouth 2 (two) times daily. 60 tablet 3  . oxyCODONE-acetaminophen (ROXICET) 5-325 MG tablet Take 1 tablet by mouth every 6 (six) hours as needed for severe pain. 20 tablet 0  . pantoprazole (PROTONIX) 40 MG tablet Take 1 tablet (40 mg total) by mouth daily. 30 tablet 2  . potassium chloride (K-DUR) 10 MEQ tablet Take 1 tablet (10 mEq total) by mouth daily. 30 tablet 0  . triamcinolone cream (KENALOG) 0.1 % Apply 1 application topically 2 (two) times  daily. 60 g 1  . verapamil (CALAN-SR) 120 MG CR tablet Take 1 tablet (120 mg total) by mouth 2 (two) times daily. 60 tablet 0   No current facility-administered medications for this visit.      Past Surgical History:  Procedure Laterality Date  . AORTIC VALVE REPLACEMENT       No Known Allergies    Family History  Problem Relation Age of Onset  . Diabetes Mother   . Heart attack Father      Social History Kirk Oconnor reports that he has quit smoking. He has never used smokeless tobacco. Kirk Oconnor reports that he does not drink alcohol.   Review of Systems CONSTITUTIONAL: No weight loss, fever, chills, weakness or fatigue.  HEENT: Eyes: No visual loss, blurred vision, double vision or yellow sclerae.No hearing loss, sneezing, congestion, runny nose or sore throat.  SKIN: No rash or itching.  CARDIOVASCULAR:  RESPIRATORY: No shortness of breath, cough or sputum.  GASTROINTESTINAL: No anorexia, nausea, vomiting or diarrhea. No abdominal pain or blood.  GENITOURINARY: No burning on urination, no polyuria NEUROLOGICAL: No headache, dizziness, syncope, paralysis, ataxia, numbness or tingling in the extremities. No change in bowel or bladder control.  MUSCULOSKELETAL: No muscle, back pain, joint pain or stiffness.  LYMPHATICS: No enlarged nodes. No history of splenectomy.  PSYCHIATRIC: No history of  depression or anxiety.  ENDOCRINOLOGIC: No reports of sweating, cold or heat intolerance. No polyuria or polydipsia.  Marland Kitchen   Physical Examination There were no vitals filed for this visit. There were no vitals filed for this visit.  Gen: resting comfortably, no acute distress HEENT: no scleral icterus, pupils equal round and reactive, no palptable cervical adenopathy,  CV Resp: Clear to auscultation bilaterally GI: abdomen is soft, non-tender, non-distended, normal bowel sounds, no hepatosplenomegaly MSK: extremities are warm, no edema.  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

## 2017-06-20 NOTE — Telephone Encounter (Signed)
Please see previous phone note.   I discussed with him not refilling narcotics.   [name], MD [facility] 06/20/2017, 3:41 PM

## 2017-06-20 NOTE — Telephone Encounter (Signed)
lmtcb
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
## 2017-06-26 ENCOUNTER — Ambulatory Visit (INDEPENDENT_AMBULATORY_CARE_PROVIDER_SITE_OTHER): Payer: Medicaid Other | Admitting: Family Medicine

## 2017-06-26 ENCOUNTER — Encounter: Payer: Self-pay | Admitting: Family Medicine

## 2017-06-26 VITALS — BP 164/116 | HR 101 | Temp 97.7°F | Ht 66.0 in | Wt 155.4 lb

## 2017-06-26 DIAGNOSIS — M5440 Lumbago with sciatica, unspecified side: Secondary | ICD-10-CM

## 2017-06-26 DIAGNOSIS — R079 Chest pain, unspecified: Secondary | ICD-10-CM | POA: Diagnosis not present

## 2017-06-26 DIAGNOSIS — G8929 Other chronic pain: Secondary | ICD-10-CM | POA: Insufficient documentation

## 2017-06-26 DIAGNOSIS — M542 Cervicalgia: Secondary | ICD-10-CM | POA: Diagnosis not present

## 2017-06-26 DIAGNOSIS — I10 Essential (primary) hypertension: Secondary | ICD-10-CM | POA: Diagnosis not present

## 2017-06-26 DIAGNOSIS — M549 Dorsalgia, unspecified: Secondary | ICD-10-CM

## 2017-06-26 LAB — CBC WITH DIFFERENTIAL/PLATELET
BASOS ABS: 0 10*3/uL (ref 0.0–0.2)
Basos: 0 %
EOS (ABSOLUTE): 0.1 10*3/uL (ref 0.0–0.4)
Eos: 2 %
HEMOGLOBIN: 13.6 g/dL (ref 13.0–17.7)
Hematocrit: 42.7 % (ref 37.5–51.0)
Immature Grans (Abs): 0 10*3/uL (ref 0.0–0.1)
Immature Granulocytes: 0 %
LYMPHS ABS: 2.2 10*3/uL (ref 0.7–3.1)
Lymphs: 52 %
MCH: 29.7 pg (ref 26.6–33.0)
MCHC: 31.9 g/dL (ref 31.5–35.7)
MCV: 93 fL (ref 79–97)
Monocytes Absolute: 0.5 10*3/uL (ref 0.1–0.9)
Monocytes: 11 %
NEUTROS ABS: 1.5 10*3/uL (ref 1.4–7.0)
Neutrophils: 35 %
PLATELETS: 175 10*3/uL (ref 150–379)
RBC: 4.58 x10E6/uL (ref 4.14–5.80)
RDW: 16.4 % — ABNORMAL HIGH (ref 12.3–15.4)
WBC: 4.2 10*3/uL (ref 3.4–10.8)

## 2017-06-26 LAB — CMP14+EGFR
A/G RATIO: 1.2 (ref 1.2–2.2)
ALBUMIN: 3.8 g/dL (ref 3.5–5.5)
ALT: 28 IU/L (ref 0–44)
AST: 34 IU/L (ref 0–40)
Alkaline Phosphatase: 95 IU/L (ref 39–117)
BILIRUBIN TOTAL: 0.3 mg/dL (ref 0.0–1.2)
BUN / CREAT RATIO: 10 (ref 9–20)
BUN: 11 mg/dL (ref 6–24)
CHLORIDE: 105 mmol/L (ref 96–106)
CO2: 22 mmol/L (ref 20–29)
Calcium: 8.7 mg/dL (ref 8.7–10.2)
Creatinine, Ser: 1.11 mg/dL (ref 0.76–1.27)
GFR calc non Af Amer: 72 mL/min/{1.73_m2} (ref >59)
GFR, EST AFRICAN AMERICAN: 84 mL/min/{1.73_m2} (ref >59)
Globulin, Total: 3.3 g/dL (ref 1.5–4.5)
Glucose: 90 mg/dL (ref 65–99)
POTASSIUM: 4 mmol/L (ref 3.5–5.2)
SODIUM: 139 mmol/L (ref 134–144)
TOTAL PROTEIN: 7.1 g/dL (ref 6.0–8.5)

## 2017-06-26 MED ORDER — GABAPENTIN 300 MG PO CAPS: 300.0000 mg | ORAL_CAPSULE | Freq: Three times a day (TID) | ORAL | 3 refills | Status: DC

## 2017-06-26 MED ORDER — LISINOPRIL 10 MG PO TABS: 10.0000 mg | ORAL_TABLET | Freq: Every day | ORAL | 3 refills | Status: DC
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
## 2017-06-26 NOTE — Patient Instructions (Addendum)
Great to see you!  Come back in 3-4 weeks for follow up pain and high blood pressure  For pain start gabapentin 1 pill 3 times daily.  For hypertension continue your current medications and add lisinopril 1 pill once daily.  We have a referral placed for orthopedic surgery and for pain management.

## 2017-06-27 ENCOUNTER — Encounter: Payer: Self-pay | Admitting: Family Medicine

## 2017-06-27 NOTE — Telephone Encounter (Signed)
Unable to contact patient.

## 2017-06-28 ENCOUNTER — Encounter (INDEPENDENT_AMBULATORY_CARE_PROVIDER_SITE_OTHER): Payer: Self-pay | Admitting: Orthopaedic Surgery

## 2017-06-28 ENCOUNTER — Other Ambulatory Visit (INDEPENDENT_AMBULATORY_CARE_PROVIDER_SITE_OTHER): Payer: Self-pay

## 2017-06-28 ENCOUNTER — Ambulatory Visit (INDEPENDENT_AMBULATORY_CARE_PROVIDER_SITE_OTHER): Payer: Medicaid Other | Admitting: Orthopaedic Surgery

## 2017-06-28 VITALS — Resp 14 | Ht 66.0 in | Wt 155.0 lb

## 2017-06-28 DIAGNOSIS — G8929 Other chronic pain: Secondary | ICD-10-CM | POA: Diagnosis not present

## 2017-06-28 DIAGNOSIS — M542 Cervicalgia: Secondary | ICD-10-CM

## 2017-06-28 DIAGNOSIS — M5441 Lumbago with sciatica, right side: Secondary | ICD-10-CM

## 2017-06-28 DIAGNOSIS — M5442 Lumbago with sciatica, left side: Secondary | ICD-10-CM | POA: Diagnosis not present

## 2017-06-28 MED ORDER — METHOCARBAMOL 500 MG PO TABS: 500.0000 mg | ORAL_TABLET | Freq: Three times a day (TID) | ORAL | 0 refills | Status: DC | PRN

## 2017-06-28 NOTE — Addendum Note (Signed)
Addended by: Mare Loan on: 06/28/2017 04:05 PM   Modules accepted: Orders
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
## 2017-06-30 ENCOUNTER — Telehealth (INDEPENDENT_AMBULATORY_CARE_PROVIDER_SITE_OTHER): Payer: Self-pay | Admitting: Orthopaedic Surgery

## 2017-06-30 NOTE — Telephone Encounter (Signed)
Called and spoke with pharmacist and told her per PW, do not fill this rx

## 2017-06-30 NOTE — Telephone Encounter (Signed)
[facility] needs clarification on directions for [employer].# [phone]

## 2017-07-11 ENCOUNTER — Emergency Department (HOSPITAL_COMMUNITY): Payer: Medicaid Other

## 2017-07-11 ENCOUNTER — Encounter (HOSPITAL_COMMUNITY): Payer: Self-pay | Admitting: *Deleted

## 2017-07-11 ENCOUNTER — Encounter: Payer: Self-pay | Admitting: Family Medicine

## 2017-07-11 ENCOUNTER — Inpatient Hospital Stay (HOSPITAL_COMMUNITY)
Admission: EM | Admit: 2017-07-11 | Discharge: 2017-07-14 | DRG: 313 | Disposition: A | Discharge status: Home / Self Care | Payer: Medicaid Other | Attending: Family Medicine | Admitting: Family Medicine

## 2017-07-11 ENCOUNTER — Ambulatory Visit (INDEPENDENT_AMBULATORY_CARE_PROVIDER_SITE_OTHER): Payer: Medicaid Other | Admitting: Family Medicine

## 2017-07-11 VITALS — BP 173/127 | HR 107 | Temp 97.2°F | Ht 66.0 in | Wt 152.0 lb

## 2017-07-11 DIAGNOSIS — Z79899 Other long term (current) drug therapy: Secondary | ICD-10-CM

## 2017-07-11 DIAGNOSIS — G4733 Obstructive sleep apnea (adult) (pediatric): Secondary | ICD-10-CM | POA: Diagnosis present

## 2017-07-11 DIAGNOSIS — Z952 Presence of prosthetic heart valve: Secondary | ICD-10-CM

## 2017-07-11 DIAGNOSIS — R079 Chest pain, unspecified: Secondary | ICD-10-CM

## 2017-07-11 DIAGNOSIS — Z7982 Long term (current) use of aspirin: Secondary | ICD-10-CM

## 2017-07-11 DIAGNOSIS — I160 Hypertensive urgency: Secondary | ICD-10-CM | POA: Diagnosis not present

## 2017-07-11 DIAGNOSIS — Z765 Malingerer [conscious simulation]: Secondary | ICD-10-CM | POA: Diagnosis not present

## 2017-07-11 DIAGNOSIS — R739 Hyperglycemia, unspecified: Secondary | ICD-10-CM

## 2017-07-11 DIAGNOSIS — R0789 Other chest pain: Principal | ICD-10-CM | POA: Diagnosis present

## 2017-07-11 DIAGNOSIS — I429 Cardiomyopathy, unspecified: Secondary | ICD-10-CM | POA: Diagnosis present

## 2017-07-11 DIAGNOSIS — I2510 Atherosclerotic heart disease of native coronary artery without angina pectoris: Secondary | ICD-10-CM | POA: Diagnosis present

## 2017-07-11 DIAGNOSIS — I10 Essential (primary) hypertension: Secondary | ICD-10-CM | POA: Diagnosis present

## 2017-07-11 DIAGNOSIS — I959 Hypotension, unspecified: Secondary | ICD-10-CM | POA: Diagnosis not present

## 2017-07-11 DIAGNOSIS — R319 Hematuria, unspecified: Secondary | ICD-10-CM | POA: Diagnosis present

## 2017-07-11 DIAGNOSIS — D696 Thrombocytopenia, unspecified: Secondary | ICD-10-CM

## 2017-07-11 DIAGNOSIS — Z87891 Personal history of nicotine dependence: Secondary | ICD-10-CM

## 2017-07-11 HISTORY — DX: Atherosclerotic heart disease of native coronary artery without angina pectoris: I25.10

## 2017-07-11 HISTORY — DX: Essential (primary) hypertension: I10

## 2017-07-11 HISTORY — DX: Unspecified viral hepatitis C without hepatic coma: B19.20

## 2017-07-11 HISTORY — DX: Nonrheumatic aortic (valve) insufficiency: I35.1

## 2017-07-11 HISTORY — DX: Aortic aneurysm of unspecified site, without rupture: I71.9

## 2017-07-11 LAB — BASIC METABOLIC PANEL
ANION GAP: 7 (ref 5–15)
BUN: 10 mg/dL (ref 6–20)
CHLORIDE: 106 mmol/L (ref 101–111)
CO2: 24 mmol/L (ref 22–32)
CREATININE: 1.12 mg/dL (ref 0.61–1.24)
Calcium: 8.9 mg/dL (ref 8.9–10.3)
GFR calc non Af Amer: >60 mL/min (ref >60)
Glucose, Bld: 147 mg/dL — ABNORMAL HIGH (ref 65–99)
POTASSIUM: 3.6 mmol/L (ref 3.5–5.1)
SODIUM: 137 mmol/L (ref 135–145)

## 2017-07-11 LAB — CBC
HCT: 42 % (ref 39.0–52.0)
HEMOGLOBIN: 13.9 g/dL (ref 13.0–17.0)
MCH: 30.6 pg (ref 26.0–34.0)
MCHC: 33.1 g/dL (ref 30.0–36.0)
MCV: 92.5 fL (ref 78.0–100.0)
Platelets: 128 10*3/uL — ABNORMAL LOW (ref 150–400)
RBC: 4.54 MIL/uL (ref 4.22–5.81)
RDW: 16 % — ABNORMAL HIGH (ref 11.5–15.5)
WBC: 5 10*3/uL (ref 4.0–10.5)

## 2017-07-11 LAB — TROPONIN I

## 2017-07-11 MED ORDER — ACETAMINOPHEN 325 MG PO TABS: 650.0000 mg | ORAL_TABLET | ORAL | Status: DC | PRN

## 2017-07-11 MED ORDER — ONDANSETRON HCL 4 MG/2ML IJ SOLN: 4.0000 mg | Freq: Four times a day (QID) | INTRAMUSCULAR | Status: DC | PRN

## 2017-07-11 MED ORDER — NITROGLYCERIN 0.4 MG SL SUBL
0.4000 mg | SUBLINGUAL_TABLET | Freq: Once | SUBLINGUAL | Status: AC
  Administered 2017-07-11: 0.4 mg via SUBLINGUAL
  Filled 2017-07-11: qty 1

## 2017-07-11 MED ORDER — FINASTERIDE 5 MG PO TABS
5.0000 mg | ORAL_TABLET | Freq: Every day | ORAL | Status: DC
Start: 2017-07-12 — End: 2017-07-14
  Administered 2017-07-12 – 2017-07-14 (×3): 5 mg via ORAL
  Filled 2017-07-11 (×6): qty 1

## 2017-07-11 MED ORDER — ALBUTEROL SULFATE HFA 108 (90 BASE) MCG/ACT IN AERS
2.0000 | INHALATION_SPRAY | Freq: Four times a day (QID) | RESPIRATORY_TRACT | Status: DC | PRN
  Filled 2017-07-11: qty 6.7

## 2017-07-11 MED ORDER — PANTOPRAZOLE SODIUM 40 MG PO TBEC
40.0000 mg | DELAYED_RELEASE_TABLET | Freq: Every day | ORAL | Status: DC
  Administered 2017-07-12 – 2017-07-14 (×3): 40 mg via ORAL
  Filled 2017-07-11 (×3): qty 1

## 2017-07-11 MED ORDER — VERAPAMIL HCL ER 240 MG PO TBCR
120.0000 mg | EXTENDED_RELEASE_TABLET | Freq: Two times a day (BID) | ORAL | Status: DC
  Administered 2017-07-12 (×3): 120 mg via ORAL
  Filled 2017-07-11 (×4): qty 1

## 2017-07-11 MED ORDER — LISINOPRIL 10 MG PO TABS
10.0000 mg | ORAL_TABLET | Freq: Every day | ORAL | Status: DC
  Administered 2017-07-12 – 2017-07-14 (×3): 10 mg via ORAL
  Filled 2017-07-11 (×3): qty 1

## 2017-07-11 MED ORDER — METHOCARBAMOL 500 MG PO TABS: 500.0000 mg | ORAL_TABLET | Freq: Three times a day (TID) | ORAL | Status: DC | PRN

## 2017-07-11 MED ORDER — AMLODIPINE BESYLATE 5 MG PO TABS
10.0000 mg | ORAL_TABLET | Freq: Every day | ORAL | Status: DC
  Administered 2017-07-12 – 2017-07-14 (×3): 10 mg via ORAL
  Filled 2017-07-11 (×3): qty 2

## 2017-07-11 MED ORDER — CITALOPRAM HYDROBROMIDE 20 MG PO TABS
20.0000 mg | ORAL_TABLET | Freq: Every day | ORAL | Status: DC
  Administered 2017-07-12 – 2017-07-14 (×3): 20 mg via ORAL
  Filled 2017-07-11 (×3): qty 1

## 2017-07-11 MED ORDER — MORPHINE SULFATE (PF) 2 MG/ML IV SOLN
1.0000 mg | INTRAVENOUS | Status: DC | PRN
  Administered 2017-07-11: 1 mg via INTRAVENOUS
  Filled 2017-07-11: qty 1

## 2017-07-11 MED ORDER — HYDROXYZINE HCL 25 MG PO TABS
50.0000 mg | ORAL_TABLET | Freq: Two times a day (BID) | ORAL | Status: DC
  Administered 2017-07-12 – 2017-07-14 (×6): 50 mg via ORAL
  Filled 2017-07-11 (×6): qty 2

## 2017-07-11 MED ORDER — METOPROLOL TARTRATE 50 MG PO TABS
50.0000 mg | ORAL_TABLET | Freq: Two times a day (BID) | ORAL | Status: DC
  Administered 2017-07-12 (×3): 50 mg via ORAL
  Filled 2017-07-11 (×3): qty 1

## 2017-07-11 MED ORDER — POTASSIUM CHLORIDE CRYS ER 10 MEQ PO TBCR
10.0000 meq | EXTENDED_RELEASE_TABLET | Freq: Every day | ORAL | Status: DC
  Administered 2017-07-12: 10 meq via ORAL
  Filled 2017-07-11 (×2): qty 1

## 2017-07-11 MED ORDER — GABAPENTIN 300 MG PO CAPS
300.0000 mg | ORAL_CAPSULE | Freq: Three times a day (TID) | ORAL | Status: DC
  Administered 2017-07-12 – 2017-07-14 (×8): 300 mg via ORAL
  Filled 2017-07-11 (×8): qty 1

## 2017-07-11 MED ORDER — ENOXAPARIN SODIUM 40 MG/0.4ML ~~LOC~~ SOLN
40.0000 mg | SUBCUTANEOUS | Status: DC
  Administered 2017-07-12 – 2017-07-13 (×3): 40 mg via SUBCUTANEOUS
  Filled 2017-07-11 (×3): qty 0.4

## 2017-07-11 MED ORDER — ASPIRIN EC 81 MG PO TBEC
81.0000 mg | DELAYED_RELEASE_TABLET | Freq: Every day | ORAL | Status: DC
  Administered 2017-07-12 – 2017-07-14 (×3): 81 mg via ORAL
  Filled 2017-07-11 (×3): qty 1

## 2017-07-11 MED ORDER — GI COCKTAIL ~~LOC~~
30.0000 mL | Freq: Four times a day (QID) | ORAL | Status: DC | PRN
Start: 2017-07-11 — End: 2017-07-14
  Administered 2017-07-12: 30 mL via ORAL
  Filled 2017-07-11: qty 30

## 2017-07-11 NOTE — ED Notes (Signed)
Dr. Selena Batten in room with pt, is aware that pt's CP still there at 10/10

## 2017-07-11 NOTE — ED Triage Notes (Addendum)
Pt c/o chest pain x 4 days; pt describes the pain as sharp and denies any radiation; pt had aortic valve replacement x 6 weeks ago; pt given 2 nitro en route with ems and pt was given 324mg  aspirin at the doctor's office this am; pt was seen at his PCP this am for chest pain and was told to come to ED but pt refused at the time

## 2017-07-11 NOTE — ED Notes (Signed)
EKG done upon patient arrival in room. EKG given and seen by Dr Verdie Mosher

## 2017-07-11 NOTE — Progress Notes (Signed)
Subjective: ZO:XWRUE pain PCP: Elenora Gamma, MD AVW:[id number] Kirk Oconnor is a 61 y.o. male presenting to clinic today for:  1. Chest pain Patient reports onset of substernal chest pain starting Friday. He describes pain as a toothache. He notes that the pain is worsened with activity and is associated with nausea. Denies shortness of breath, vomiting, diaphoresis, dizziness, visual changes. He does not monitor his blood pressure at home but does report that his daughter puts out his medication daily. He reports having taken his blood pressure medication around 3:00 this morning. He notes he is only taking one blood pressure medication despite 4 antihypertensives being prescribed to him. He notes he's been seen several times for chest pain. He also reports that he went to [facility] and and was told the only thing that could be offered to him was physical therapy.    He reports a past medical history significant for 3 myocardial infarctions.  Chart review reveals history of congestive heart failure and aortic root dilation. He recently had a valve replacement.  No Known Allergies Past Medical History:  Diagnosis Date  . Carpal tunnel syndrome   . Hypertension   . Sleep apnea    Family History  Problem Relation Age of Onset  . Diabetes Mother   . Heart attack Father    Social Hx: non smoker.Current medications reviewed.   ROS: Per HPI  Objective: Office vital signs reviewed. BP (!) 173/127   Pulse (!) 107   Temp (!) 97.2 F (36.2 C) (Oral)   Ht  (1.676 m)   Wt 152 lb (68.9 kg)   BMI 24.53 kg/m   Physical Examination:  General: Awake, alert, dissheveled, No acute distress, lying on exam table. HEENT:     Neck: no JVD Cardio: regular rate and rhythm, S1S2 heard, no murmurs appreciated Pulm: clear to auscultation bilaterally, no wheezes, rhonchi or rales; normal work of breathing on room air GI: soft, non-tender, non-distended, bowel sounds present x4 Ext: no  edema  Assessment/ Plan: 61 y.o. male   1. Chest pain, unspecified type EKG performed there is flattening of ST in lead 1 and aVR, aVL. There are also more prominent ST waves in V1, V2, V3. I reviewed with the patient that I am concerned he is having an MI/ hypertensive emergency with cardiac changes secondary to significantly elevated blood pressures.  He was given 325 mg of aspirin and started on oxygen therapy. I advised that he be transported to the nearest emergency department via ambulance. I reviewed with him the risks of not treating potential heart attack and his elevated blood pressures. He voiced good understanding that death and disability could result.  He declined transport twice.  Patient has capacity. He was discharged home. - EKG 12-Lead  2. Hypertensive urgency I suspect secondary to noncompliance with oral antihypertensives. Patient does not seem to have a good grasp as to which medication he should be taking. I did discuss his case with his primary care provider who echoed that patient often presents to office for similar and unfortunately has poor compliance with medications.  I reviewed his blood pressure medications with him and I asked that he have his daughter review them as well so that everyone is on the same page as to which medications he should be taking daily.  I recommended transport to the nearest emergency department via ambulance as above. Patient declined.  3. Drug-seeking behavior I am somewhat concerned about possible drug-seeking behavior, as patient hyper focused  on this at the end of our appointment and was somewhat disgruntled by the fact that I would not prescribe him opioid medications. He does have a referral in place to pain management which was placed by his primary care provider. Per patient, he is dissatisfied with this. I recommended that he consider following up with his PCP soon for discussion regarding his persistent pain.   Orders Placed This  Encounter  Procedures  . EKG 12-Lead   No orders of the defined types were placed in this encounter.   Upon providing patient with his AVS, he demanded that his pain medication be resumed, stating "Dr. Ermalinda Memos does not care about me. Does he want me to commit suicide because I'm in pain; I have a gun?  I'm firing him ".  I asked him if he was suicidal or had a plan to hurt himself or others.  I offered him psychiatric evaluation.  He denied SI/HI and walked out abruptly.  Raliegh Ip, DO [facility] [phone]

## 2017-07-11 NOTE — ED Notes (Signed)
Pt states that he is feeling better, pain is better, update given,

## 2017-07-11 NOTE — Patient Instructions (Signed)
You have an EKG done today, there were some changes compared to EKG that you have last month. For this reason and because you're having chest pain, I did recommend that he go to the emergency department via ambulance. You were given 325 mg of aspirin and oxygen today. You refused transport to the hospital for further evaluation and management and acknowledged that the risk could be death if you were to have an untreated heart attack.    Angina Pectoris Angina pectoris is a very bad feeling in the chest, neck, or arm. Your doctor may call it angina. There are four types of angina. Angina is caused by a lack of blood in the middle and thickest layer of the heart wall (myocardium). Angina may feel like a crushing or squeezing pain in the chest. It may feel like tightness or heavy pressure in the chest. Some people say it feels like gas, heartburn, or indigestion. Some people have symptoms other than pain. These include:  Shortness of breath.  Cold sweats.  Feeling sick to your stomach (nausea).  Feeling light-headed.  Many women have chest discomfort and some of the other symptoms. However, women often have different symptoms, such as:  Feeling tired (fatigue).  Feeling nervous for no reason.  Feeling weak for no reason.  Dizziness or fainting.  Women may have angina without any symptoms. Follow these instructions at home:  Take medicines only as told by your doctor.  Take care of other health issues as told by your doctor. These include: ? High blood pressure (hypertension). ? Diabetes.  Follow a heart-healthy diet. Your doctor can help you to choose healthy food options and make changes.  Talk to your doctor to learn more about healthy cooking methods and use them. These include: ? Roasting. ? Grilling. ? Broiling. ? Baking. ? Poaching. ? Steaming. ? Stir-frying.  Follow an exercise program approved by your doctor.  Keep a healthy weight. Lose weight as told by your  doctor.  Rest when you are tired.  Learn to manage stress.  Do not use any tobacco, such as cigarettes, chewing tobacco, or electronic cigarettes. If you need help quitting, ask your doctor.  If you drink alcohol, and your doctor says it is okay, limit yourself to no more than 1 drink per day. One drink equals 12 ounces of beer, 5 ounces of wine, or 1 ounces of hard liquor.  Stop illegal drug use.  Keep all follow-up visits as told by your doctor. This is important. Do not take these medicines unless your doctor says that you can:  Nonsteroidal anti-inflammatory drugs (NSAIDs). These include: ? Ibuprofen. ? Naproxen. ? Celecoxib.  Vitamin supplements that have vitamin A, vitamin E, or both.  Hormone therapy that contains estrogen with or without progestin.  Get help right away if:  You have pain in your chest, neck, arm, jaw, stomach, or back that: ? Lasts more than a few minutes. ? Comes back. ? Does not get better after you take medicine under your tongue (sublingual nitroglycerin).  You have any of these symptoms for no reason: ? Gas, heartburn, or indigestion. ? Sweating a lot. ? Shortness of breath or trouble breathing. ? Feeling sick to your stomach or throwing up. ? Feeling more tired than usual. ? Feeling nervous or worrying more than usual. ? Feeling weak. ? Diarrhea.  You are suddenly dizzy or light-headed.  You faint or pass out. These symptoms may be an emergency. Do not wait to see if the symptoms  will go away. Get medical help right away. Call your local emergency services (911 in the [place] Do not drive yourself to the hospital. This information is not intended to replace advice given to you by your health care provider. Make sure you discuss any questions you have with your health care provider. Document Released: 02/29/2008 Document Revised: 02/18/2016 Document Reviewed: 01/14/2014 Elsevier Interactive Patient Education  2017 [employer].

## 2017-07-11 NOTE — ED Provider Notes (Signed)
[facility] EMERGENCY DEPARTMENT Provider Note   CSN: [id number] Arrival date & time: 07/11/17  2017     History   Chief Complaint Chief Complaint  Patient presents with  . Chest Pain    HPI Kirk Oconnor is a 61 y.o. male.  The history is provided by the patient.  Chest Pain   This is a new problem. The current episode started 2 days ago. The problem occurs constantly. The problem has been gradually worsening. The pain is associated with exertion and movement. The pain is present in the substernal region and lateral region. The pain is moderate. Quality: aching. The pain does not radiate. The symptoms are aggravated by certain positions and exertion. Associated symptoms include diaphoresis, lower extremity edema and shortness of breath. Pertinent negatives include no back pain, no leg pain, no nausea, no near-syncope and no vomiting. He has tried nothing for the symptoms. The treatment provided no relief.   61 year old male who presents with chest pain. History of HTN and AVR. Reports constant chest pain since pulling weeds on Friday. Pain is aching, across chest, associating with walking, bending over, and movement. Associated with feeling sweaty, short of breath. Mild ankle edema. No orthopnea or PND.   Took nitro without relief. Seen by PCP today, and sent to ED for evaluation of chest pain. Full dose of aspirin given in doctor's office.     Past Medical History:  Diagnosis Date  . Carpal tunnel syndrome   . Hypertension   . Sleep apnea     Patient Active Problem List   Diagnosis Date Noted  . Chronic back pain 06/26/2017  . BPH (benign prostatic hyperplasia) 10/22/2016  . GERD (gastroesophageal reflux disease) 10/22/2016  . Chest pain 10/21/2016  . Aortic root dilation (HCC) 10/21/2016  . Acute diastolic CHF (congestive heart failure) (HCC) 10/21/2016  . Essential hypertension, benign 03/27/2013  . Hepatitis C 03/27/2013  . Snoring 03/27/2013    Past Surgical  History:  Procedure Laterality Date  . AORTIC VALVE REPLACEMENT         Home Medications    Prior to Admission medications   Medication Sig Start Date End Date Taking? Authorizing Provider  albuterol (PROVENTIL HFA;VENTOLIN HFA) 108 (90 Base) MCG/ACT inhaler Inhale 2 puffs into the lungs every 6 (six) hours as needed for wheezing or shortness of breath. 10/23/16  Yes Erick Blinks, MD  amLODipine (NORVASC) 10 MG tablet Take 1 tablet (10 mg total) by mouth daily. 06/20/17  Yes Elenora Gamma, MD  aspirin EC 81 MG EC tablet Take 1 tablet (81 mg total) by mouth daily. 10/24/16  Yes Erick Blinks, MD  gabapentin (NEURONTIN) 300 MG capsule Take 1 capsule (300 mg total) by mouth 3 (three) times daily. 06/26/17  Yes Elenora Gamma, MD  hydrocortisone cream 1 % Apply to affected area 2 times daily 12/21/16  Yes Cathren Laine, MD  citalopram (CELEXA) 20 MG tablet Take 1 tablet (20 mg total) by mouth daily. 05/22/17   Elenora Gamma, MD  finasteride (PROSCAR) 5 MG tablet Take 1 tablet (5 mg total) by mouth daily. 11/21/16   Elenora Gamma, MD  hydrOXYzine (ATARAX/VISTARIL) 50 MG tablet Take 50 mg by mouth 2 (two) times daily.    [provider]  lisinopril (PRINIVIL,ZESTRIL) 10 MG tablet Take 1 tablet (10 mg total) by mouth daily. 06/26/17   Elenora Gamma, MD  methocarbamol (ROBAXIN) 500 MG tablet Take 1 tablet (500 mg total) by mouth every  8 (eight) hours as needed for muscle spasms. 06/28/17   Valeria Batman, MD  metoprolol tartrate (LOPRESSOR) 50 MG tablet Take 1 tablet (50 mg total) by mouth 2 (two) times daily. 07-14-17   Elenora Gamma, MD  oxyCODONE-acetaminophen (PERCOCET/ROXICET) 5-325 MG tablet Take 1-2 tablets by mouth every 4 (four) hours as needed. Patient not taking: Reported on 07/11/2017 07/14/2017   Raeford Razor, MD  pantoprazole (PROTONIX) 40 MG tablet Take 1 tablet (40 mg total) by mouth daily. 11/21/16   Elenora Gamma, MD  potassium chloride  (K-DUR) 10 MEQ tablet Take 1 tablet (10 mEq total) by mouth daily. 11/21/16   Elenora Gamma, MD  triamcinolone cream (KENALOG) 0.1 % Apply 1 application topically 2 (two) times daily. 06/14/17   Mechele Claude, MD  verapamil (CALAN-SR) 120 MG CR tablet Take 1 tablet (120 mg total) by mouth 2 (two) times daily. 07-14-17   Elenora Gamma, MD    Family History Family History  Problem Relation Age of Onset  . Diabetes Mother   . Heart attack Father     Social History Social History  Substance Use Topics  . Smoking status: Former Games developer  . Smokeless tobacco: Never Used  . Alcohol use No     Allergies   Patient has no known allergies.   Review of Systems Review of Systems  Constitutional: Positive for diaphoresis.  Respiratory: Positive for shortness of breath.   Cardiovascular: Positive for chest pain. Negative for near-syncope.  Gastrointestinal: Negative for nausea and vomiting.  Musculoskeletal: Negative for back pain.  All other systems reviewed and are negative.    Physical Exam Updated Vital Signs BP (!) 153/98   Pulse 98   Temp 97.7 F (36.5 C) (Oral)   Resp 18   Ht  (1.676 m)   Wt 70.3 kg (155 lb)   SpO2 97%   BMI 25.02 kg/m   Physical Exam Physical Exam  Nursing note and vitals reviewed. Constitutional: Well developed, well nourished, non-toxic, and in no acute distress Head: Normocephalic and atraumatic.  Mouth/Throat: Oropharynx is clear and moist.  Neck: Normal range of motion. Neck supple.  Cardiovascular: Normal rate and regular rhythm.   Pulmonary/Chest: Effort normal and breath sounds normal.  Abdominal: Soft. There is no tenderness. There is no rebound and no guarding.  Musculoskeletal: Normal range of motion. no edema Neurological: Alert, no facial droop, fluent speech, moves all extremities symmetrically Skin: Skin is warm and dry.  Psychiatric: Cooperative   ED Treatments / Results  Labs (all labs ordered are listed, but  only abnormal results are displayed) Labs Reviewed  BASIC METABOLIC PANEL - Abnormal; Notable for the following:       Result Value   Glucose, Bld 147 (*)    All other components within normal limits  CBC - Abnormal; Notable for the following:    RDW 16.0 (*)    Platelets 128 (*)    All other components within normal limits  TROPONIN I    EKG  EKG Interpretation  Date/Time:  Tuesday July 11 2017 20:21:28 EDT Ventricular Rate:  82 PR Interval:    QRS Duration: 115 QT Interval:  377 QTC Calculation: 441 R Axis:   -67 Text Interpretation:  Sinus rhythm Probable left atrial enlargement LVH with IVCD, LAD and secondary repol abnrm inferior and lateral deep t-wave inversions more pronounced, seen on 07-14-2017 EKG  Confirmed by Crista Curb [phone]) on 07/11/2017 8:26:03 PM       Radiology  Dg Chest 2 View  Result Date: 07/11/2017 CLINICAL DATA:  Shortness of breath and chest pain. Cough. Hypertension. EXAM: CHEST  2 VIEW COMPARISON:  June 20, 2017 FINDINGS: There is no edema or consolidation. Heart is borderline enlarged with pulmonary vascularity within normal limits. Patient is status post aortic valve replacement. Prominence of the thoracic aorta is stable. No adenopathy. No bone lesions. No pneumothorax. IMPRESSION: No edema or consolidation. Heart borderline enlarged. Status post aortic valve replacement. Prominence of the aorta is likely due to chronic hypertensive change. Electronically Signed   By: Bretta Bang III M.D.   On: 07/11/2017 21:27    Procedures Procedures (including critical care time)  Medications Ordered in ED Medications  nitroGLYCERIN (NITROSTAT) SL tablet 0.4 mg (0.4 mg Sublingual Given 07/11/17 2042)     Initial Impression / Assessment and Plan / ED Course  I have reviewed the triage vital signs and the nursing notes.  Pertinent labs & imaging results that were available during my care of the patient were reviewed by me and considered in my  medical decision making (see chart for details).     Presents with chest pain. Records are reviewed. He has had multiple PCP office visits for chest pain for which she has been sent to the ED for subsequently. I he has had meant many ED evaluations for chest pain as well as well as an admission in January 2018 for chest pain evaluation. He had normal serial enzymes, but no further testing.  Returns for chest pain today. Parts of his history seems typical in other parts atypical. According to PCPs chart, there is also concern for possible pain seeking behavior.  His EKG however does show some changes. Since September he has had new ST depression/deep T-wave inversions in the lateral leads. He also has more significant in the inferior T-wave inversions since his last EKG of September. On record review, he had a left heart catheterization with his aortic valve replacement in July 2018. At that time he did show 25-50% occlusion of the mid RCA, which would correlate with his EKG changes here today.  History troponin here is normal. Blood work otherwise reassuring. Chest x-ray visualized and shows no acute cardiopulmonary processes. Symptoms not concerning at this time for dissection or PE.  Discussed with Dr. Selena Batten who will admit for observation for chest pain evaluation.  Final Clinical Impressions(s) / ED Diagnoses   Final diagnoses:  Nonspecific chest pain    New Prescriptions New Prescriptions   No medications on file     Lavera Guise, MD 07/11/17 2147

## 2017-07-11 NOTE — H&P (Signed)
TRH H&P   Patient Demographics:    Kirk Oconnor, is a 61 y.o. male  MRN: [id number]   DOB - Apr 25, 1956  Admit Date - 07/11/2017  Outpatient Primary MD for the patient is Elenora Gamma, MD  Referring MD/NP/PA:  Crista Curb  Outpatient Specialists:  Dr Purvis Sheffield  Patient coming from: home  Chief Complaint  Patient presents with  . Chest Pain      HPI:    Kirk Oconnor  is a 61 y.o. male, w hx of CAD, s/p AVR, apparently c/o chest pain substernal worse starting on Friday when he tried to pull limbs off the driveway. Radiation to bilateral arms. Slight cough, white sputum. Slight heartburn,  Slight nausea.   Denies fever, chill, palp, nothing appears to make it better or worse.    In ED,   EKG nsr at 80, lad, t inversion in 2, 3, avf, v4-6.   Trop negative.  Glucose 147, Bun 10, Creatinine 1.12 CXR negative Pt will be admitted for chest pain.      Review of systems:    In addition to the HPI above,  No Fever-chills, No Headache, No changes with Vision or hearing, No problems swallowing food or Liquids, No Abdominal pain, No Nausea or Vommitting, Bowel movements are regular, No Blood in stool or Urine, No dysuria, No new skin rashes or bruises, No new joints pains-aches,  No new weakness, tingling, numbness in any extremity, No recent weight gain or loss, No polyuria, polydypsia or polyphagia, No significant Mental Stressors.  A full 10 point Review of Systems was done, except as stated above, all other Review of Systems were negative.   With Past History of the following :    Past Medical History:  Diagnosis Date  . Carpal tunnel syndrome   . Hepatitis C   . Hypertension   . Sleep apnea       Past Surgical History:  Procedure Laterality Date  . AORTIC VALVE REPLACEMENT        Social History:     Social History  Substance Use Topics  .  Smoking status: Former Games developer  . Smokeless tobacco: Never Used  . Alcohol use No     Lives - at home  Mobility - walks by self  Family History :     Family History  Problem Relation Age of Onset  . Diabetes Mother   . Heart attack Mother   . Heart attack Father       Home Medications:   Prior to Admission medications   Medication Sig Start Date End Date Taking? Authorizing Provider  albuterol (PROVENTIL HFA;VENTOLIN HFA) 108 (90 Base) MCG/ACT inhaler Inhale 2 puffs into the lungs every 6 (six) hours as needed for wheezing or shortness of breath. 10/23/16  Yes Erick Blinks, MD  amLODipine (NORVASC) 10 MG tablet Take 1 tablet (10 mg total) by mouth  daily. 06/20/17  Yes Elenora Gamma, MD  aspirin EC 81 MG EC tablet Take 1 tablet (81 mg total) by mouth daily. 10/24/16  Yes Erick Blinks, MD  gabapentin (NEURONTIN) 300 MG capsule Take 1 capsule (300 mg total) by mouth 3 (three) times daily. 06/26/17  Yes Elenora Gamma, MD  hydrocortisone cream 1 % Apply to affected area 2 times daily 12/21/16  Yes Cathren Laine, MD  citalopram (CELEXA) 20 MG tablet Take 1 tablet (20 mg total) by mouth daily. 05/22/17   Elenora Gamma, MD  finasteride (PROSCAR) 5 MG tablet Take 1 tablet (5 mg total) by mouth daily. 11/21/16   Elenora Gamma, MD  hydrOXYzine (ATARAX/VISTARIL) 50 MG tablet Take 50 mg by mouth 2 (two) times daily.    [provider]  lisinopril (PRINIVIL,ZESTRIL) 10 MG tablet Take 1 tablet (10 mg total) by mouth daily. 06/26/17   Elenora Gamma, MD  methocarbamol (ROBAXIN) 500 MG tablet Take 1 tablet (500 mg total) by mouth every 8 (eight) hours as needed for muscle spasms. 06/28/17   Valeria Batman, MD  metoprolol tartrate (LOPRESSOR) 50 MG tablet Take 1 tablet (50 mg total) by mouth 2 (two) times daily. 06/20/17   Elenora Gamma, MD  oxyCODONE-acetaminophen (PERCOCET/ROXICET) 5-325 MG tablet Take 1-2 tablets by mouth every 4 (four) hours as  needed. Patient not taking: Reported on 07/11/2017 06/20/17   Raeford Razor, MD  pantoprazole (PROTONIX) 40 MG tablet Take 1 tablet (40 mg total) by mouth daily. 11/21/16   Elenora Gamma, MD  potassium chloride (K-DUR) 10 MEQ tablet Take 1 tablet (10 mEq total) by mouth daily. 11/21/16   Elenora Gamma, MD  triamcinolone cream (KENALOG) 0.1 % Apply 1 application topically 2 (two) times daily. 06/14/17   Mechele Claude, MD  verapamil (CALAN-SR) 120 MG CR tablet Take 1 tablet (120 mg total) by mouth 2 (two) times daily. 06/20/17   Elenora Gamma, MD     Allergies:    No Known Allergies   Physical Exam:   Vitals  Blood pressure (!) 153/98, pulse 98, temperature 97.7 F (36.5 C), temperature source Oral, resp. rate 18, height  (1.676 m), weight 70.3 kg (155 lb), SpO2 97 %.   1. General lying in bed in NAD  2. Normal affect and insight, Not Suicidal or Homicidal, Awake Alert, Oriented X 3.  3. No F.N deficits, ALL C.Nerves Intact, Strength 5/5 all 4 extremities, Sensation intact all 4 extremities, Plantars down going.  4. Ears and Eyes appear Normal, Conjunctivae clear, PERRLA. Moist Oral Mucosa.  5. Supple Neck, No JVD, No cervical lymphadenopathy appriciated, No Carotid Bruits.  6. Symmetrical Chest wall movement, Good air movement bilaterally, CTAB.  7. Midline scar, RRR, No Gallops, Rubs or Murmurs, No Parasternal Heave.  8. Positive Bowel Sounds, Abdomen Soft, No tenderness, No organomegaly appriciated,No rebound -guarding or rigidity.  9.  No Cyanosis, Normal Skin Turgor, No Skin Rash or Bruise.  10. Good muscle tone,  joints appear normal , no effusions, Normal ROM.  11. No Palpable Lymph Nodes in Neck or Axillae    Data Review:    CBC  Recent Labs Lab 07/11/17 2043  WBC 5.0  HGB 13.9  HCT 42.0  PLT 128*  MCV 92.5  MCH 30.6  MCHC 33.1  RDW 16.0*    ------------------------------------------------------------------------------------------------------------------  Chemistries   Recent Labs Lab 07/11/17 2043  NA 137  K 3.6  CL 106  CO2 24  GLUCOSE  147*  BUN 10  CREATININE 1.12  CALCIUM 8.9   ------------------------------------------------------------------------------------------------------------------ estimated creatinine clearance is 64.1 mL/min (by C-G formula based on SCr of 1.12 mg/dL). ------------------------------------------------------------------------------------------------------------------ No results for input(s): TSH, T4TOTAL, T3FREE, THYROIDAB in the last 72 hours.  Invalid input(s): FREET3  Coagulation profile No results for input(s): INR, PROTIME in the last 168 hours. ------------------------------------------------------------------------------------------------------------------- No results for input(s): DDIMER in the last 72 hours. -------------------------------------------------------------------------------------------------------------------  Cardiac Enzymes  Recent Labs Lab 07/11/17 2043  TROPONINI <0.03   ------------------------------------------------------------------------------------------------------------------    Component Value Date/Time   BNP 206.5 (H) 12/21/2016 1222     ---------------------------------------------------------------------------------------------------------------  Urinalysis    Component Value Date/Time   COLORURINE AMBER (A) 11/30/2011 1910   APPEARANCEUR CLEAR 11/30/2011 1910   LABSPEC 1.025 11/30/2011 1910   PHURINE 6.0 11/30/2011 1910   GLUCOSEU NEGATIVE 11/30/2011 1910   HGBUR NEGATIVE 11/30/2011 1910   BILIRUBINUR NEGATIVE 11/30/2011 1910   KETONESUR NEGATIVE 11/30/2011 1910   PROTEINUR NEGATIVE 11/30/2011 1910   UROBILINOGEN 1.0 11/30/2011 1910   NITRITE NEGATIVE 11/30/2011 1910   LEUKOCYTESUR NEGATIVE 11/30/2011 1910     ----------------------------------------------------------------------------------------------------------------   Imaging Results:    Dg Chest 2 View  Result Date: 07/11/2017 CLINICAL DATA:  Shortness of breath and chest pain. Cough. Hypertension. EXAM: CHEST  2 VIEW COMPARISON:  June 20, 2017 FINDINGS: There is no edema or consolidation. Heart is borderline enlarged with pulmonary vascularity within normal limits. Patient is status post aortic valve replacement. Prominence of the thoracic aorta is stable. No adenopathy. No bone lesions. No pneumothorax. IMPRESSION: No edema or consolidation. Heart borderline enlarged. Status post aortic valve replacement. Prominence of the aorta is likely due to chronic hypertensive change. Electronically Signed   By: Bretta Bang III M.D.   On: 07/11/2017 21:27    Assessment & Plan:    Principal Problem:   Chest pain Active Problems:   Hyperglycemia   Thrombocytopenia (HCC)   Cp (atypical, recent cath=>  minimal CAD in RCA) Tele Trop I q6h px3 Cardiac echo Cardiology consult in am Check lipid  Hyperglycemia Check hga1c  Thrombocytopenia Check cbc in am  1.    DVT Prophylaxis  Lovenox - SCDs  AM Labs Ordered, also please review Full Orders  Family Communication: Admission, patients condition and plan of care including tests being ordered have been discussed with the patientwho indicate understanding and agree with the plan and Code Status.  Code Status FULL CODE  Likely DC to  home  Condition GUARDED    Consults called: cardiology  Admission status: observation  Time spent in minutes : 45    Pearson Grippe M.D on 07/11/2017 at 10:12 PM  Between 7am to 7pm - Pager - [phone]. After 7pm go to [email or web address] - password [facility]  [facility] - Office  [phone]

## 2017-07-12 ENCOUNTER — Encounter (HOSPITAL_COMMUNITY): Payer: Self-pay | Admitting: Cardiology

## 2017-07-12 ENCOUNTER — Observation Stay (HOSPITAL_BASED_OUTPATIENT_CLINIC_OR_DEPARTMENT_OTHER): Payer: Medicaid Other

## 2017-07-12 DIAGNOSIS — I5030 Unspecified diastolic (congestive) heart failure: Secondary | ICD-10-CM

## 2017-07-12 DIAGNOSIS — Z9889 Other specified postprocedural states: Secondary | ICD-10-CM | POA: Diagnosis not present

## 2017-07-12 DIAGNOSIS — Z953 Presence of xenogenic heart valve: Secondary | ICD-10-CM | POA: Diagnosis not present

## 2017-07-12 DIAGNOSIS — R0602 Shortness of breath: Secondary | ICD-10-CM

## 2017-07-12 DIAGNOSIS — I428 Other cardiomyopathies: Secondary | ICD-10-CM | POA: Diagnosis not present

## 2017-07-12 DIAGNOSIS — Z8679 Personal history of other diseases of the circulatory system: Secondary | ICD-10-CM

## 2017-07-12 DIAGNOSIS — R0789 Other chest pain: Secondary | ICD-10-CM | POA: Diagnosis not present

## 2017-07-12 DIAGNOSIS — I10 Essential (primary) hypertension: Secondary | ICD-10-CM | POA: Diagnosis not present

## 2017-07-12 DIAGNOSIS — R079 Chest pain, unspecified: Secondary | ICD-10-CM

## 2017-07-12 DIAGNOSIS — R739 Hyperglycemia, unspecified: Secondary | ICD-10-CM

## 2017-07-12 DIAGNOSIS — I25119 Atherosclerotic heart disease of native coronary artery with unspecified angina pectoris: Secondary | ICD-10-CM

## 2017-07-12 DIAGNOSIS — D696 Thrombocytopenia, unspecified: Secondary | ICD-10-CM

## 2017-07-12 LAB — MRSA PCR SCREENING: MRSA BY PCR: NEGATIVE

## 2017-07-12 LAB — I-STAT TROPONIN, ED
Troponin i, poc: 0.01 ng/mL (ref 0.00–0.08)
Troponin i, poc: 0.02 ng/mL (ref 0.00–0.08)

## 2017-07-12 LAB — ECHOCARDIOGRAM COMPLETE
HEIGHTINCHES: 66 in
Weight: 2430.35 oz

## 2017-07-12 MED ORDER — ALBUTEROL SULFATE (2.5 MG/3ML) 0.083% IN NEBU: 2.5000 mg | INHALATION_SOLUTION | Freq: Four times a day (QID) | RESPIRATORY_TRACT | Status: DC | PRN

## 2017-07-12 MED ORDER — HYDRALAZINE HCL 20 MG/ML IJ SOLN
10.0000 mg | Freq: Four times a day (QID) | INTRAMUSCULAR | Status: DC | PRN
  Administered 2017-07-12: 10 mg via INTRAVENOUS
  Filled 2017-07-12: qty 1

## 2017-07-12 MED ORDER — OXYCODONE HCL 5 MG PO TABS
5.0000 mg | ORAL_TABLET | Freq: Four times a day (QID) | ORAL | Status: DC | PRN
  Administered 2017-07-12 – 2017-07-14 (×6): 5 mg via ORAL
  Filled 2017-07-12 (×7): qty 1

## 2017-07-12 NOTE — ED Notes (Signed)
Dr Selena Batten notified of pt's vital signs, additional orders given,

## 2017-07-12 NOTE — ED Notes (Signed)
Pt called out due to return of chest pain, pt states that the pain is located on right side of chest area and radiates to right side of neck area, described pain as sharp in nature, pain is associated with sob and nausea. Repeat ekg performed, given to edp,

## 2017-07-12 NOTE — ED Notes (Addendum)
Pt states that he is pain free at present time, comfort measures provided,
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
## 2017-07-12 NOTE — Progress Notes (Signed)
Patient refuses BP cuff other than intermittent.

## 2017-07-12 NOTE — Progress Notes (Addendum)
PROGRESS NOTE    Kirk Oconnor  VOH:[id number] DOB: [birth year] DOA: 07/11/2017 PCP: Elenora Gamma, MD   Brief Narrative:  Kirk Oconnor  is a 61 y.o. male, w hx of CAD, s/p AVR, apparently c/o chest pain substernal worse starting on Friday when he tried to pull limbs off the driveway. Radiation to bilateral arms. Slight cough, white sputum. Slight heartburn,  Slight nausea.   Denies fever, chill, palp, nothing appears to make it better or worse.    In ED,   EKG nsr at 80, lad, t inversion in 2, 3, avf, v4-6.   Trop negative.  Glucose 147, Bun 10, Creatinine 1.12 CXR negative Pt will be admitted for chest pain.    Assessment & Plan:   Principal Problem:   Chest pain Active Problems:   Hyperglycemia   Thrombocytopenia (HCC)  Chest pain - recent cath showing minimal CAD - telemetry - cardiology consulted - echo pending - lipids pending  Hematuria - ua ordered  Thrombocytopenia - repeat CBCD in am  Hyperglycemia  -HgA1c 5.3     DVT Prophylaxis  Lovenox - SCDs Family Communication: Admission, patients condition and plan of care including tests being ordered have been discussed with the patientwho indicate understanding and agree with the plan and Code Status.  Code Status FULL CODE Likely DC to  home  Consults called: cardiology   Consultants:   Cardiology  Procedures:   Echocardiogram- pending  Antimicrobials:   None    Subjective: Patient reports his pain is still intermittent.  States he has also had hematuria overnight.  Reports he has had hematuria occasionally at home.  Feels nauseous.  Objective: Vitals:   07/12/17 0600 07/12/17 0736 07/12/17 1200 07/12/17 1400  BP:   (!) 146/129 101/81  Pulse: 84 82 90 74  Resp: 17 14 17 17   Temp:  98.5 F (36.9 C)    TempSrc:  Axillary    SpO2: 97% 98% 97% 94%  Weight:      Height:        Intake/Output Summary (Last 24 hours) at 07/12/17 1601 Last data filed at 07/12/17 1430  Gross per 24 hour  Intake              450 ml  Output              750 ml  Net             -300 ml   Filed Weights   07/11/17 2019 07/11/17 2025 07/12/17 0313  Weight: 68.9 kg (152 lb) 70.3 kg (155 lb) 68.9 kg (151 lb 14.4 oz)    Examination:  General exam: Appears calm and comfortable  Respiratory system: Clear to auscultation. Respiratory effort normal. Cardiovascular system: S1 & S2 heard, RRR. No JVD, rubs, gallops or clicks. Systolic murmur heard (I/VI) No pedal edema. Gastrointestinal system: Abdomen is nondistended, soft and nontender. No organomegaly or masses felt. Normal bowel sounds heard. Central nervous system: Alert and oriented. No focal neurological deficits. Extremities: Symmetric 5 x 5 power. Skin: No rashes, lesions or ulcers Psychiatry: Judgement and insight appear normal. Mood & affect appropriate.     Data Reviewed: I have personally reviewed following labs and imaging studies  CBC:  Recent Labs Lab 07/11/17 2043  WBC 5.0  HGB 13.9  HCT 42.0  MCV 92.5  PLT 128*   Basic Metabolic Panel:  Recent Labs Lab 07/11/17 2043  NA 137  K 3.6  CL 106  CO2 24  GLUCOSE  147*  BUN 10  CREATININE 1.12  CALCIUM 8.9   GFR: Estimated Creatinine Clearance: 64.1 mL/min (by C-G formula based on SCr of 1.12 mg/dL). Liver Function Tests: No results for input(s): AST, ALT, ALKPHOS, BILITOT, PROT, ALBUMIN in the last 168 hours. No results for input(s): LIPASE, AMYLASE in the last 168 hours. No results for input(s): AMMONIA in the last 168 hours. Coagulation Profile: No results for input(s): INR, PROTIME in the last 168 hours. Cardiac Enzymes:  Recent Labs Lab 07/11/17 2043  TROPONINI <0.03   BNP (last 3 results) No results for input(s): PROBNP in the last 8760 hours. HbA1C: No results for input(s): HGBA1C in the last 72 hours. CBG: No results for input(s): GLUCAP in the last 168 hours. Lipid Profile: No results for input(s): CHOL, HDL, LDLCALC, TRIG,  CHOLHDL, LDLDIRECT in the last 72 hours. Thyroid Function Tests: No results for input(s): TSH, T4TOTAL, FREET4, T3FREE, THYROIDAB in the last 72 hours. Anemia Panel: No results for input(s): VITAMINB12, FOLATE, FERRITIN, TIBC, IRON, RETICCTPCT in the last 72 hours. Sepsis Labs: No results for input(s): PROCALCITON, LATICACIDVEN in the last 168 hours.  Recent Results (from the past 240 hour(s))  MRSA PCR Screening     Status: None   Collection Time: 07/12/17  3:35 AM  Result Value Ref Range Status   MRSA by PCR NEGATIVE NEGATIVE Final    Comment:        The GeneXpert MRSA Assay (FDA approved for NASAL specimens only), is one component of a comprehensive MRSA colonization surveillance program. It is not intended to diagnose MRSA infection nor to guide or monitor treatment for MRSA infections.          Radiology Studies: Dg Chest 2 View  Result Date: 07/11/2017 CLINICAL DATA:  Shortness of breath and chest pain. Cough. Hypertension. EXAM: CHEST  2 VIEW COMPARISON:  June 20, 2017 FINDINGS: There is no edema or consolidation. Heart is borderline enlarged with pulmonary vascularity within normal limits. Patient is status post aortic valve replacement. Prominence of the thoracic aorta is stable. No adenopathy. No bone lesions. No pneumothorax. IMPRESSION: No edema or consolidation. Heart borderline enlarged. Status post aortic valve replacement. Prominence of the aorta is likely due to chronic hypertensive change. Electronically Signed   By: Bretta BangWilliam  Woodruff III M.D.   On: 07/11/2017 21:27        Scheduled Meds: . amLODipine  10 mg Oral Daily  . aspirin EC  81 mg Oral Daily  . citalopram  20 mg Oral Daily  . enoxaparin (LOVENOX) injection  40 mg Subcutaneous Q24H  . finasteride  5 mg Oral Daily  . gabapentin  300 mg Oral TID  . hydrOXYzine  50 mg Oral BID  . lisinopril  10 mg Oral Daily  . metoprolol tartrate  50 mg Oral BID  . pantoprazole  40 mg Oral Daily  .  potassium chloride  10 mEq Oral Daily  . verapamil  120 mg Oral BID   Continuous Infusions:   LOS: 0 days    Time spent: 30 minutes    Katrinka BlazingAlex U Kadolph, MD [facility] Pager [phone]  If 7PM-7AM, please contact night-coverage [email or web address] Password [email or web address] 07/12/2017, 4:01 PM

## 2017-07-12 NOTE — Progress Notes (Signed)
*  PRELIMINARY RESULTS* Echocardiogram 2D Echocardiogram has been performed.  Kirk Oconnor 07/12/2017, 3:16 PM

## 2017-07-13 DIAGNOSIS — D696 Thrombocytopenia, unspecified: Secondary | ICD-10-CM | POA: Diagnosis present

## 2017-07-13 DIAGNOSIS — I10 Essential (primary) hypertension: Secondary | ICD-10-CM | POA: Diagnosis present

## 2017-07-13 DIAGNOSIS — I428 Other cardiomyopathies: Secondary | ICD-10-CM

## 2017-07-13 DIAGNOSIS — Z7982 Long term (current) use of aspirin: Secondary | ICD-10-CM | POA: Diagnosis not present

## 2017-07-13 DIAGNOSIS — Z79899 Other long term (current) drug therapy: Secondary | ICD-10-CM | POA: Diagnosis not present

## 2017-07-13 DIAGNOSIS — R319 Hematuria, unspecified: Secondary | ICD-10-CM | POA: Diagnosis present

## 2017-07-13 DIAGNOSIS — I25119 Atherosclerotic heart disease of native coronary artery with unspecified angina pectoris: Secondary | ICD-10-CM | POA: Diagnosis not present

## 2017-07-13 DIAGNOSIS — I959 Hypotension, unspecified: Secondary | ICD-10-CM | POA: Diagnosis not present

## 2017-07-13 DIAGNOSIS — I429 Cardiomyopathy, unspecified: Secondary | ICD-10-CM | POA: Diagnosis present

## 2017-07-13 DIAGNOSIS — I2510 Atherosclerotic heart disease of native coronary artery without angina pectoris: Secondary | ICD-10-CM | POA: Diagnosis present

## 2017-07-13 DIAGNOSIS — R739 Hyperglycemia, unspecified: Secondary | ICD-10-CM | POA: Diagnosis present

## 2017-07-13 DIAGNOSIS — Z952 Presence of prosthetic heart valve: Secondary | ICD-10-CM | POA: Diagnosis not present

## 2017-07-13 DIAGNOSIS — G4733 Obstructive sleep apnea (adult) (pediatric): Secondary | ICD-10-CM | POA: Diagnosis present

## 2017-07-13 DIAGNOSIS — Z953 Presence of xenogenic heart valve: Secondary | ICD-10-CM | POA: Diagnosis not present

## 2017-07-13 DIAGNOSIS — R0789 Other chest pain: Secondary | ICD-10-CM | POA: Diagnosis present

## 2017-07-13 DIAGNOSIS — Z87891 Personal history of nicotine dependence: Secondary | ICD-10-CM | POA: Diagnosis not present

## 2017-07-13 DIAGNOSIS — R079 Chest pain, unspecified: Secondary | ICD-10-CM | POA: Diagnosis present

## 2017-07-13 LAB — URINALYSIS, ROUTINE W REFLEX MICROSCOPIC
BILIRUBIN URINE: NEGATIVE
Glucose, UA: NEGATIVE mg/dL
Hgb urine dipstick: NEGATIVE
KETONES UR: NEGATIVE mg/dL
Leukocytes, UA: NEGATIVE
NITRITE: NEGATIVE
PROTEIN: NEGATIVE mg/dL
SPECIFIC GRAVITY, URINE: 1.011 (ref 1.005–1.030)
pH: 6 (ref 5.0–8.0)

## 2017-07-13 LAB — CBC WITH DIFFERENTIAL/PLATELET
BASOS ABS: 0 10*3/uL (ref 0.0–0.1)
BASOS PCT: 0 %
EOS ABS: 0.1 10*3/uL (ref 0.0–0.7)
EOS PCT: 2 %
HCT: 44.6 % (ref 39.0–52.0)
HEMOGLOBIN: 14.2 g/dL (ref 13.0–17.0)
Lymphocytes Relative: 46 %
Lymphs Abs: 2.8 10*3/uL (ref 0.7–4.0)
MCH: 30.1 pg (ref 26.0–34.0)
MCHC: 31.8 g/dL (ref 30.0–36.0)
MCV: 94.7 fL (ref 78.0–100.0)
Monocytes Absolute: 0.5 10*3/uL (ref 0.1–1.0)
Monocytes Relative: 8 %
NEUTROS PCT: 44 %
Neutro Abs: 2.7 10*3/uL (ref 1.7–7.7)
PLATELETS: 148 10*3/uL — AB (ref 150–400)
RBC: 4.71 MIL/uL (ref 4.22–5.81)
RDW: 16.5 % — ABNORMAL HIGH (ref 11.5–15.5)
WBC: 6.2 10*3/uL (ref 4.0–10.5)

## 2017-07-13 LAB — HIV ANTIBODY (ROUTINE TESTING W REFLEX): HIV SCREEN 4TH GENERATION: NONREACTIVE

## 2017-07-13 MED ORDER — HYDROCORTISONE 1 % EX CREA
TOPICAL_CREAM | Freq: Two times a day (BID) | CUTANEOUS | Status: DC
  Administered 2017-07-14: 1 via TOPICAL
  Filled 2017-07-13: qty 28

## 2017-07-13 MED ORDER — TRIAMCINOLONE ACETONIDE 0.1 % EX CREA
1.0000 "application " | TOPICAL_CREAM | Freq: Two times a day (BID) | CUTANEOUS | Status: DC
  Filled 2017-07-13: qty 15

## 2017-07-13 MED ORDER — IBUPROFEN 400 MG PO TABS: 600.0000 mg | ORAL_TABLET | Freq: Four times a day (QID) | ORAL | Status: DC | PRN

## 2017-07-13 MED ORDER — CARVEDILOL 12.5 MG PO TABS
25.0000 mg | ORAL_TABLET | Freq: Two times a day (BID) | ORAL | Status: DC
  Administered 2017-07-13 – 2017-07-14 (×3): 25 mg via ORAL
  Filled 2017-07-13 (×3): qty 2

## 2017-07-13 NOTE — Progress Notes (Signed)
PROGRESS NOTE    Kirk Oconnor  HYW:[id number] DOB: [birth year] DOA: 07/11/2017 PCP: Elenora Gamma, MD   Brief Narrative:  Kirk Oconnor  is a 61 y.o. male, w hx of CAD, s/p AVR, apparently c/o chest pain substernal worse starting on Friday when he tried to pull limbs off the driveway. Radiation to bilateral arms. Slight cough, white sputum. Slight heartburn,  Slight nausea.   Denies fever, chill, palp, nothing appears to make it better or worse.    In ED,   EKG nsr at 80, lad, t inversion in 2, 3, avf, v4-6.   Trop negative.  Glucose 147, Bun 10, Creatinine 1.12 CXR negative Pt will be admitted for chest pain.    Assessment & Plan:   Principal Problem:   Chest pain Active Problems:   Hyperglycemia   Thrombocytopenia (HCC)  Chest pain - recent cath showing minimal CAD - telemetry - cardiology consulted- no further ischemic workup at this time - echo showing diffuse hypokinesis, EF of 40% (stable from last echo) - lipids pending - no concerns voiced today - etiology possibly musculoskeletal  Cardiomyopathy - continue lisinopril, Norvasc, aspirin - change lopressor to coreg - discontinue calan - cardiology following - f/u BP  Hematuria - ua ordered - urine in urinal appears clear and yellow (no signs of gross hematuria)  Thrombocytopenia - repeat CBCD pending  Hyperglycemia  -HgA1c 5.3     DVT Prophylaxis  Lovenox - SCDs Family Communication: no family bedside Code Status FULL CODE Likely DC to  home in 1-2 days    Consultants:   Cardiology  Procedures:  Echocardiogram- Moderate LVH with LVEF approximately 40%, diffuse hypokinesis,   grade 1 diastolic dysfunction. Upper normal left atrial chamber   size. Trivial mitral regurgitation. Medtronic bioprosthesis in   aortic position without perivalvular leak, trivial aortic   regurgitation. Transvalvular gradients are consistent with normal   function. Mildly reduced right ventricular  contraction. Trivial   tricuspid regurgitation  Antimicrobials:   None    Subjective: Patient reports pain is better today.  Did have two hypotensive BP readings this am (70s systolic but MAP >48).  Denies shortness of breath, increased work of breathing, abdominal pain.  States he feels weak when he walks.  Objective: Vitals:   07/13/17 0400 07/13/17 0500 07/13/17 0600 07/13/17 0740  BP: (!) 77/66  90/68   Pulse: (!) 56  (!) 57 65  Resp: 13  15 13   Temp: 97.6 F (36.4 C)   98 F (36.7 C)  TempSrc: Axillary   Oral  SpO2: 95%  95% 100%  Weight:  70.6 kg (155 lb 10.3 oz)    Height:        Intake/Output Summary (Last 24 hours) at 07/13/17 0855 Last data filed at 07/12/17 2300  Gross per 24 hour  Intake              450 ml  Output              950 ml  Net             -500 ml   Filed Weights   07/11/17 2025 07/12/17 0313 07/13/17 0500  Weight: 70.3 kg (155 lb) 68.9 kg (151 lb 14.4 oz) 70.6 kg (155 lb 10.3 oz)    Examination:  General exam: Appears calm and comfortable  Respiratory system: Clear to auscultation. Respiratory effort normal. Cardiovascular system: S1 & S2 heard, RRR. No JVD, rubs, gallops or clicks. Systolic murmur heard (I/VI)  No pedal edema. Gastrointestinal system: Abdomen is nondistended, soft and nontender. No organomegaly or masses felt. Normal bowel sounds heard. Central nervous system: Alert and oriented. No focal neurological deficits. Extremities: Symmetric 5 x 5 power. Skin: scattered healing lesions on legs bilaterally Psychiatry: Judgement and insight appear normal. Mood & affect appropriate.    Data Reviewed: I have personally reviewed following labs and imaging studies  CBC:  Recent Labs Lab 07/11/17 2043  WBC 5.0  HGB 13.9  HCT 42.0  MCV 92.5  PLT 128*   Basic Metabolic Panel:  Recent Labs Lab 07/11/17 2043  NA 137  K 3.6  CL 106  CO2 24  GLUCOSE 147*  BUN 10  CREATININE 1.12  CALCIUM 8.9   GFR: Estimated  Creatinine Clearance: 64.1 mL/min (by C-G formula based on SCr of 1.12 mg/dL). Liver Function Tests: No results for input(s): AST, ALT, ALKPHOS, BILITOT, PROT, ALBUMIN in the last 168 hours. No results for input(s): LIPASE, AMYLASE in the last 168 hours. No results for input(s): AMMONIA in the last 168 hours. Coagulation Profile: No results for input(s): INR, PROTIME in the last 168 hours. Cardiac Enzymes:  Recent Labs Lab 07/11/17 2043  TROPONINI <0.03   BNP (last 3 results) No results for input(s): PROBNP in the last 8760 hours. HbA1C: No results for input(s): HGBA1C in the last 72 hours. CBG: No results for input(s): GLUCAP in the last 168 hours. Lipid Profile: No results for input(s): CHOL, HDL, LDLCALC, TRIG, CHOLHDL, LDLDIRECT in the last 72 hours. Thyroid Function Tests: No results for input(s): TSH, T4TOTAL, FREET4, T3FREE, THYROIDAB in the last 72 hours. Anemia Panel: No results for input(s): VITAMINB12, FOLATE, FERRITIN, TIBC, IRON, RETICCTPCT in the last 72 hours. Sepsis Labs: No results for input(s): PROCALCITON, LATICACIDVEN in the last 168 hours.  Recent Results (from the past 240 hour(s))  MRSA PCR Screening     Status: None   Collection Time: 07/12/17  3:35 AM  Result Value Ref Range Status   MRSA by PCR NEGATIVE NEGATIVE Final    Comment:        The GeneXpert MRSA Assay (FDA approved for NASAL specimens only), is one component of a comprehensive MRSA colonization surveillance program. It is not intended to diagnose MRSA infection nor to guide or monitor treatment for MRSA infections.          Radiology Studies: Dg Chest 2 View  Result Date: 07/11/2017 CLINICAL DATA:  Shortness of breath and chest pain. Cough. Hypertension. EXAM: CHEST  2 VIEW COMPARISON:  June 20, 2017 FINDINGS: There is no edema or consolidation. Heart is borderline enlarged with pulmonary vascularity within normal limits. Patient is status post aortic valve replacement.  Prominence of the thoracic aorta is stable. No adenopathy. No bone lesions. No pneumothorax. IMPRESSION: No edema or consolidation. Heart borderline enlarged. Status post aortic valve replacement. Prominence of the aorta is likely due to chronic hypertensive change. Electronically Signed   By: Bretta BangWilliam  Woodruff III M.D.   On: 07/11/2017 21:27        Scheduled Meds: . amLODipine  10 mg Oral Daily  . aspirin EC  81 mg Oral Daily  . carvedilol  25 mg Oral BID WC  . citalopram  20 mg Oral Daily  . enoxaparin (LOVENOX) injection  40 mg Subcutaneous Q24H  . finasteride  5 mg Oral Daily  . gabapentin  300 mg Oral TID  . hydrOXYzine  50 mg Oral BID  . lisinopril  10 mg Oral Daily  .  pantoprazole  40 mg Oral Daily   Continuous Infusions:   LOS: 0 days    Time spent: 30 minutes    Katrinka Blazing, MD [facility] Pager [phone]  If 7PM-7AM, please contact night-coverage [email or web address] Password [email or web address] 07/13/2017, 8:55 AM

## 2017-07-13 NOTE — Progress Notes (Signed)
Progress Note  Patient Name: Kirk Oconnor Date of Encounter: 07/13/2017  Primary Cardiologist: Dr. Prentice Docker  Subjective   No shortness of breath this morning. Complained more today of a chronic thoracic pain since his surgery in July, more so than acute exacerbation. No palpitations.  Inpatient Medications    Scheduled Meds: . amLODipine  10 mg Oral Daily  . aspirin EC  81 mg Oral Daily  . citalopram  20 mg Oral Daily  . enoxaparin (LOVENOX) injection  40 mg Subcutaneous Q24H  . finasteride  5 mg Oral Daily  . gabapentin  300 mg Oral TID  . hydrOXYzine  50 mg Oral BID  . lisinopril  10 mg Oral Daily  . metoprolol tartrate  50 mg Oral BID  . pantoprazole  40 mg Oral Daily  . potassium chloride  10 mEq Oral Daily  . verapamil  120 mg Oral BID    PRN Meds: acetaminophen, albuterol, gi cocktail, hydrALAZINE, methocarbamol, morphine injection, ondansetron (ZOFRAN) IV, oxyCODONE   Vital Signs    Vitals:   07/13/17 0300 07/13/17 0400 07/13/17 0500 07/13/17 0600  BP:  (!) 77/66  90/68  Pulse: (!) 50 (!) 56  (!) 57  Resp: 11 13  15   Temp:  97.6 F (36.4 C)    TempSrc:  Axillary    SpO2: 95% 95%  95%  Weight:   155 lb 10.3 oz (70.6 kg)   Height:        Intake/Output Summary (Last 24 hours) at 07/13/17 0735 Last data filed at 07/12/17 2300  Gross per 24 hour  Intake              450 ml  Output              950 ml  Net             -500 ml   Filed Weights   07/11/17 2025 07/12/17 0313 07/13/17 0500  Weight: 155 lb (70.3 kg) 151 lb 14.4 oz (68.9 kg) 155 lb 10.3 oz (70.6 kg)    Telemetry    Sinus rhythm. Personally reviewed.  Physical Exam   GEN: No acute distress.   Neck: No JVD. Cardiac: RRR, soft systolic murmur, no gallop.  Thorax: Well-healed sternal incision. Respiratory: Nonlabored. Clear to auscultation bilaterally. GI: Soft, nontender, bowel sounds present. MS: No edema; No deformity.  Labs    Chemistry Recent Labs Lab  07/11/17 2043  NA 137  K 3.6  CL 106  CO2 24  GLUCOSE 147*  BUN 10  CREATININE 1.12  CALCIUM 8.9  GFRNONAA >60  GFRAA >60  ANIONGAP 7     Hematology Recent Labs Lab 07/11/17 2043  WBC 5.0  RBC 4.54  HGB 13.9  HCT 42.0  MCV 92.5  MCH 30.6  MCHC 33.1  RDW 16.0*  PLT 128*    Cardiac Enzymes Recent Labs Lab 07/11/17 2043  TROPONINI <0.03    Recent Labs Lab 07/12/17 0018 07/12/17 0301  TROPIPOC 0.01 0.02     Radiology    Dg Chest 2 View  Result Date: 07/11/2017 CLINICAL DATA:  Shortness of breath and chest pain. Cough. Hypertension. EXAM: CHEST  2 VIEW COMPARISON:  June 20, 2017 FINDINGS: There is no edema or consolidation. Heart is borderline enlarged with pulmonary vascularity within normal limits. Patient is status post aortic valve replacement. Prominence of the thoracic aorta is stable. No adenopathy. No bone lesions. No pneumothorax. IMPRESSION: No edema or consolidation. Heart borderline enlarged.  Status post aortic valve replacement. Prominence of the aorta is likely due to chronic hypertensive change. Electronically Signed   By: Bretta BangWilliam  Woodruff III M.D.   On: 07/11/2017 21:27    Cardiac Studies   Echocardiogram 07/12/2017: Study Conclusions  - Left ventricle: The cavity size was normal. Wall thickness was   increased in a pattern of moderate LVH. The estimated ejection   fraction was 40%. Diffuse hypokinesis. Doppler parameters are   consistent with abnormal left ventricular relaxation (grade 1   diastolic dysfunction). - Aortic valve: A Medtronic porcine bioprosthesis was present. No   perivalvular leak. There was trivial regurgitation. Mean gradient   (S): 6 mm Hg. Peak gradient (S): 11 mm Hg. - Mitral valve: There was trivial regurgitation. - Left atrium: The atrium was at the upper limits of normal in   size. - Right ventricle: Systolic function was mildly reduced. - Right atrium: Central venous pressure (est): 3 mm Hg. - Atrial  septum: No defect or patent foramen ovale was identified. - Tricuspid valve: There was trivial regurgitation. - Pulmonary arteries: Systolic pressure could not be accurately   estimated. - Pericardium, extracardiac: There was no pericardial effusion.  Impressions:  - Moderate LVH with LVEF approximately 40%, diffuse hypokinesis,   grade 1 diastolic dysfunction. Upper normal left atrial chamber   size. Trivial mitral regurgitation. Medtronic bioprosthesis in   aortic position without perivalvular leak, trivial aortic   regurgitation. Transvalvular gradients are consistent with normal   function. Mildly reduced right ventricular contraction. Trivial   tricuspid regurgitation.  Patient Profile     61 y.o. 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 He is admitted with chest pain shortness of breath, no evidence of ACS by troponin I levels.  Assessment & Plan    1. Chest pain, recurring since surgery in July, and sounds to be more musculoskeletal in etiology. No evidence of ACS by cardiac enzymes. ECG shows chronic abnormalities that are likely repolarization changes.  2. Nonobstructive CAD by preoperative cardiac catheter[facility] in [facility].  3. History of severe aortic regurgitation with ascending aortic aneurysm status post bioprosthetic AVR and aortic root replacement with reimplantation of coronaries in July of this year at [facility]. Follow-up echocardiogram yesterday shows normal AVR function as outlined above.  4. Cardiomyopathy, LVEF stable at 40% by follow-up echocardiogram in comparison to prior [facility].  5. Obstructive sleep apnea.  I went over the results of his echocardiogram which are generally reassuring. I  would recommend medication adjustments including discontinuation of Calan and change from Lopressor to Coreg in light of cardiomyopathy. Would discontinue potassium supplements. Continue lisinopril, Norvasc, and aspirin. It sounds like he may benefit from a pain management consultation. Ischemic testing is not planned at this time. Consider transfer to telemetry with increase in activity.  Signed, Nona DellSamuel McDowell, MD  07/13/2017, 7:35 AM

## 2017-07-14 MED ORDER — METHOCARBAMOL 500 MG PO TABS: 500.0000 mg | ORAL_TABLET | Freq: Three times a day (TID) | ORAL | 0 refills | Status: DC | PRN

## 2017-07-14 MED ORDER — LISINOPRIL 10 MG PO TABS: 10.0000 mg | ORAL_TABLET | Freq: Every day | ORAL | 0 refills | Status: DC

## 2017-07-14 MED ORDER — CARVEDILOL 25 MG PO TABS: 25.0000 mg | ORAL_TABLET | Freq: Two times a day (BID) | ORAL | 0 refills | Status: DC

## 2017-07-14 MED ORDER — AMLODIPINE BESYLATE 10 MG PO TABS: 10.0000 mg | ORAL_TABLET | Freq: Every day | ORAL | 0 refills | Status: DC
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
## 2017-07-14 NOTE — Progress Notes (Signed)
Progress Note  Patient Name: Kirk Oconnor Date of Encounter: 07/14/2017  Primary Cardiologist: Dr. Prentice Docker  Subjective   No chest pain or dyspnea this morning, but had thoracic pain and hand tingling overnight. No palpitations. Good appetite.  Inpatient Medications    Scheduled Meds: . amLODipine  10 mg Oral Daily  . aspirin EC  81 mg Oral Daily  . carvedilol  25 mg Oral BID WC  . citalopram  20 mg Oral Daily  . enoxaparin (LOVENOX) injection  40 mg Subcutaneous Q24H  . finasteride  5 mg Oral Daily  . gabapentin  300 mg Oral TID  . hydrocortisone cream   Topical BID  . hydrOXYzine  50 mg Oral BID  . lisinopril  10 mg Oral Daily  . pantoprazole  40 mg Oral Daily  . triamcinolone cream  1 application Topical BID    PRN Meds: acetaminophen, albuterol, gi cocktail, hydrALAZINE, ibuprofen, methocarbamol, morphine injection, ondansetron (ZOFRAN) IV, oxyCODONE   Vital Signs    Vitals:   07/14/17 0000 07/14/17 0400 07/14/17 0500 07/14/17 0752  BP:    109/75  Pulse:    67  Resp:      Temp: 98.1 F (36.7 C) (!) 97.5 F (36.4 C)  98 F (36.7 C)  TempSrc: Axillary Axillary  Oral  SpO2:      Weight:   154 lb 12.2 oz (70.2 kg)   Height:        Intake/Output Summary (Last 24 hours) at 07/14/17 0807 Last data filed at 07/14/17 0700  Gross per 24 hour  Intake                0 ml  Output             1750 ml  Net            -1750 ml   Filed Weights   07/12/17 0313 07/13/17 0500 07/14/17 0500  Weight: 151 lb 14.4 oz (68.9 kg) 155 lb 10.3 oz (70.6 kg) 154 lb 12.2 oz (70.2 kg)    Telemetry    Sinus rhythm. Personally reviewed.  Physical Exam   GEN: No acute distress.   Neck: No JVD. Cardiac: RRR, soft systolic murmur ,no gallop. Thorax: Well-healed sternal incision. Respiratory: Nonlabored. Clear to auscultation bilaterally. GI: Soft, nontender, bowel sounds present. MS: No edema; No deformity. Neuro:  Nonfocal. Psych: Alert and oriented x 3.  Normal affect.  Labs    Chemistry  Recent Labs Lab 07/11/17 2043  NA 137  K 3.6  CL 106  CO2 24  GLUCOSE 147*  BUN 10  CREATININE 1.12  CALCIUM 8.9  GFRNONAA >60  GFRAA >60  ANIONGAP 7     Hematology  Recent Labs Lab 07/11/17 2043 07/13/17 0944  WBC 5.0 6.2  RBC 4.54 4.71  HGB 13.9 14.2  HCT 42.0 44.6  MCV 92.5 94.7  MCH 30.6 30.1  MCHC 33.1 31.8  RDW 16.0* 16.5*  PLT 128* 148*    Cardiac Enzymes  Recent Labs Lab 07/11/17 2043  TROPONINI <0.03     Recent Labs Lab 07/12/17 0018 07/12/17 0301  TROPIPOC 0.01 0.02     Radiology    No results found.  Cardiac Studies   Echocardiogram 07/12/2017: Study Conclusions  - Left ventricle: The cavity size was normal. Wall thickness was   increased in a pattern of moderate LVH. The estimated ejection   fraction was 40%. Diffuse hypokinesis. Doppler parameters are   consistent with abnormal  left ventricular relaxation (grade 1   diastolic dysfunction). - Aortic valve: A Medtronic porcine bioprosthesis was present. No   perivalvular leak. There was trivial regurgitation. Mean gradient   (S): 6 mm Hg. Peak gradient (S): 11 mm Hg. - Mitral valve: There was trivial regurgitation. - Left atrium: The atrium was at the upper limits of normal in   size. - Right ventricle: Systolic function was mildly reduced. - Right atrium: Central venous pressure (est): 3 mm Hg. - Atrial septum: No defect or patent foramen ovale was identified. - Tricuspid valve: There was trivial regurgitation. - Pulmonary arteries: Systolic pressure could not be accurately   estimated. - Pericardium, extracardiac: There was no pericardial effusion.  Impressions:  - Moderate LVH with LVEF approximately 40%, diffuse hypokinesis,   grade 1 diastolic dysfunction. Upper normal left atrial chamber   size. Trivial mitral regurgitation. Medtronic bioprosthesis in   aortic position without perivalvular leak, trivial aortic    regurgitation. Transvalvular gradients are consistent with normal   function. Mildly reduced right ventricular contraction. Trivial   tricuspid regurgitation.  Patient Profile     61 y.o. 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 He is admitted with chest pain shortness of breath, no evidence of ACS by troponin I levels.  Assessment & Plan    1. Recurrent chest/thoracic pain since surgery in July. No evidence of ACS by cardiac enzymes and ECG shows overall chronic abnormalities are most consistent with repolarization changes. Symptoms more consistent with musculoskeletal etiology. Chest stable in terms of healed incision, no acute findings by chest x-ray.  2. History of severe aortic regurgitation with ascending aortic aneurysm status post bioprosthetic AVR and aortic root replacement with reimplantation of coronaries in July at [facility]. Follow-up echocardiogram shows normal AVR function.  3. Nonobstructive CAD by preoperative cardiac catheterization in July at [facility].  4. Essential hypertension by history, blood pressure low normal n medical therapy.  5. Cardiomyopathy, nonischemic with LVEF 40%, stable by follow-up echocardiogram. No evidence of active heart failure.  6. OSA.  From a cardiac perspective, no ischemic testing is planned at this point. Chest pain symptoms are atypical and most likely musculoskeletal in etiology. Would recommend continued medical therapy which now includes aspirin, Coreg, Norvasc, and lisinopril. He will need to have follow-up with Dr. Purvis SheffieldKoneswaran after discharge. He may also need to be referred for a pain management consultation. We will sign off.  Signed, Nona DellSamuel McDowell, MD  07/14/2017, 8:07 AM

## 2017-07-14 NOTE — Progress Notes (Signed)
IV removed, Discharge teaching given. Called [name] and confirmed cab called for pt. Pt taking to ED waiting room to wait for cab.

## 2017-07-21 ENCOUNTER — Ambulatory Visit: Payer: Medicaid Other | Admitting: Family Medicine
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
## 2018-01-24 IMAGING — DX DG CHEST 2V
2 series · 2 of 2 positions shown · non-contrast
Comparison: June 20, 2017

CLINICAL DATA: Shortness of breath and chest pain. Cough.
Hypertension.

EXAM:
CHEST  2 VIEW

[chest lat]
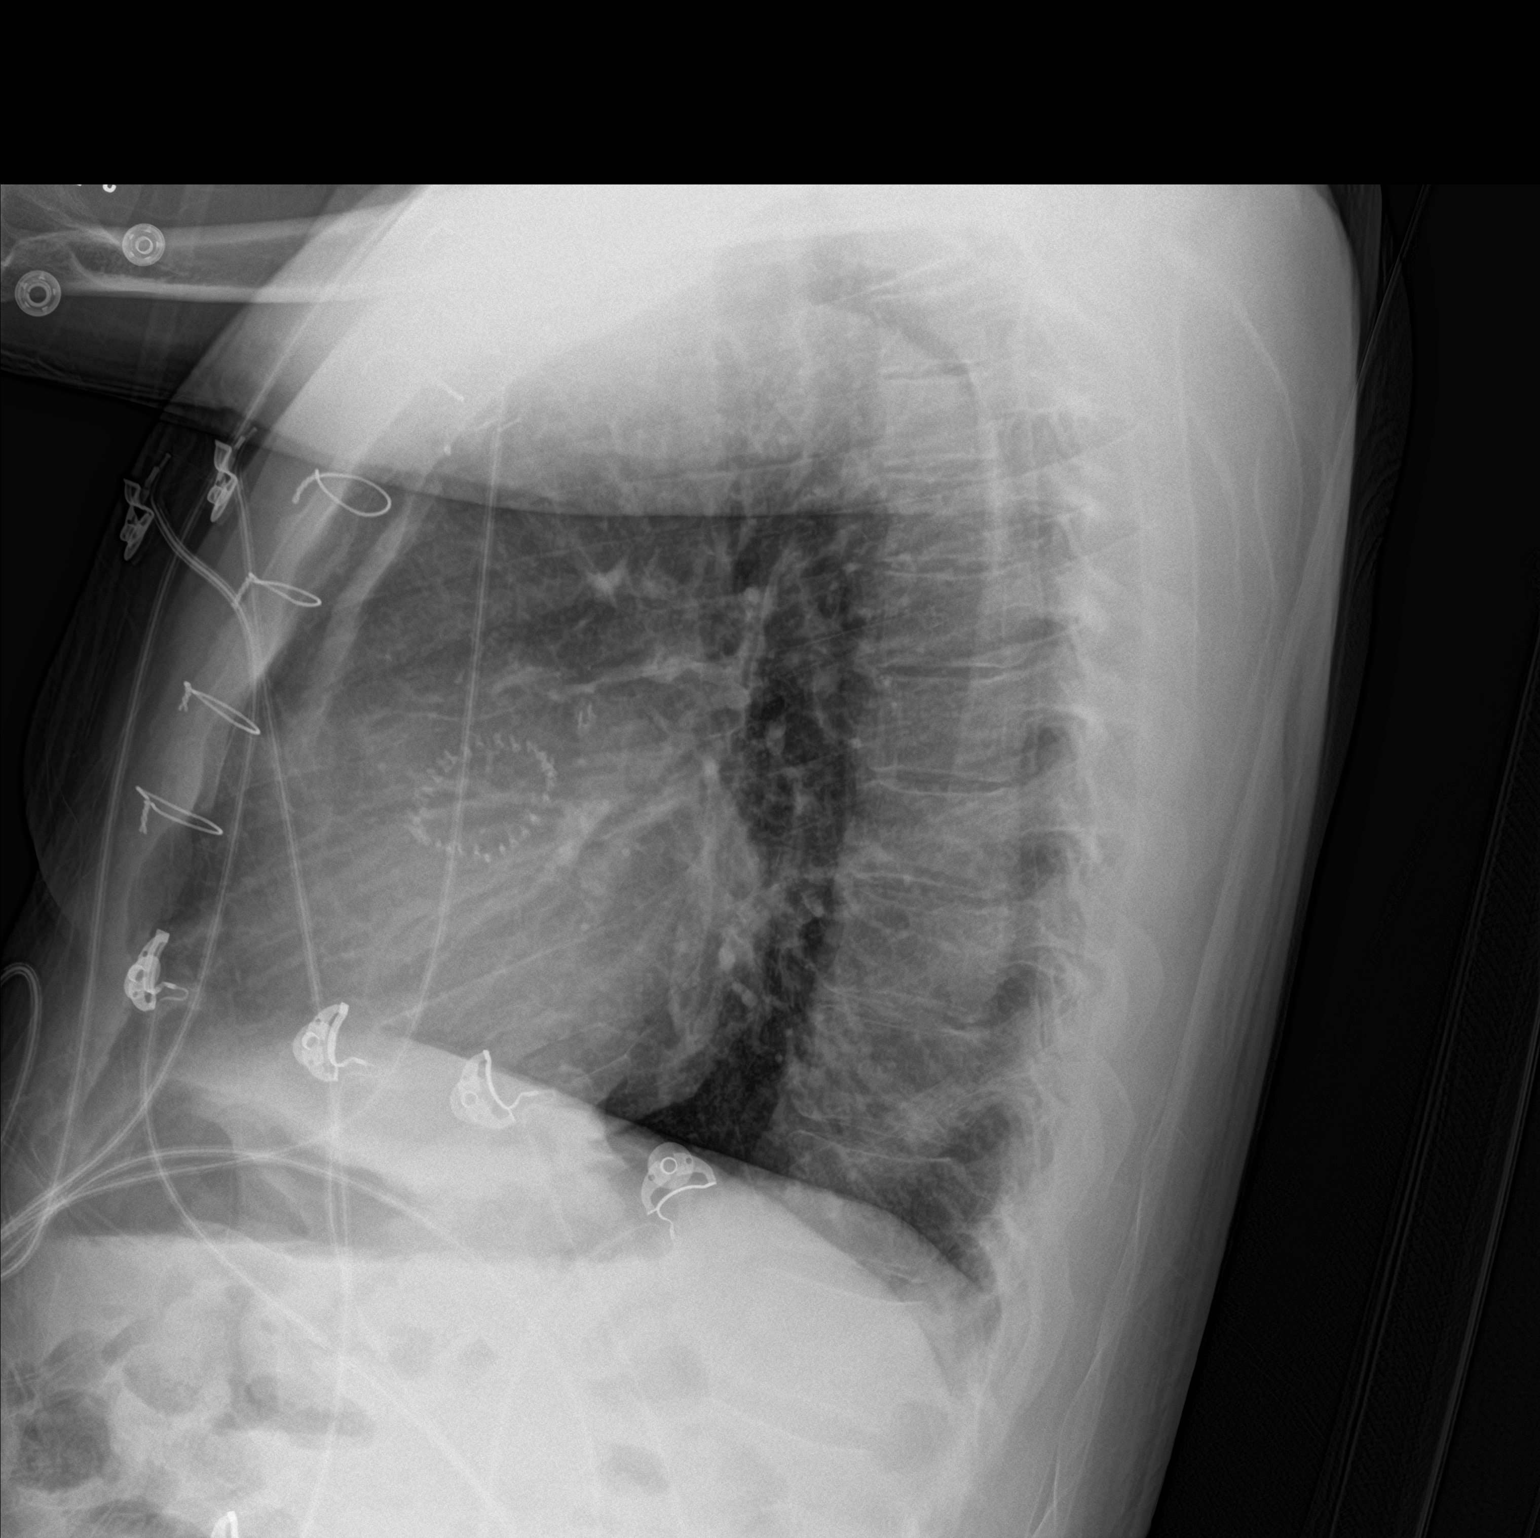

[chest ap]
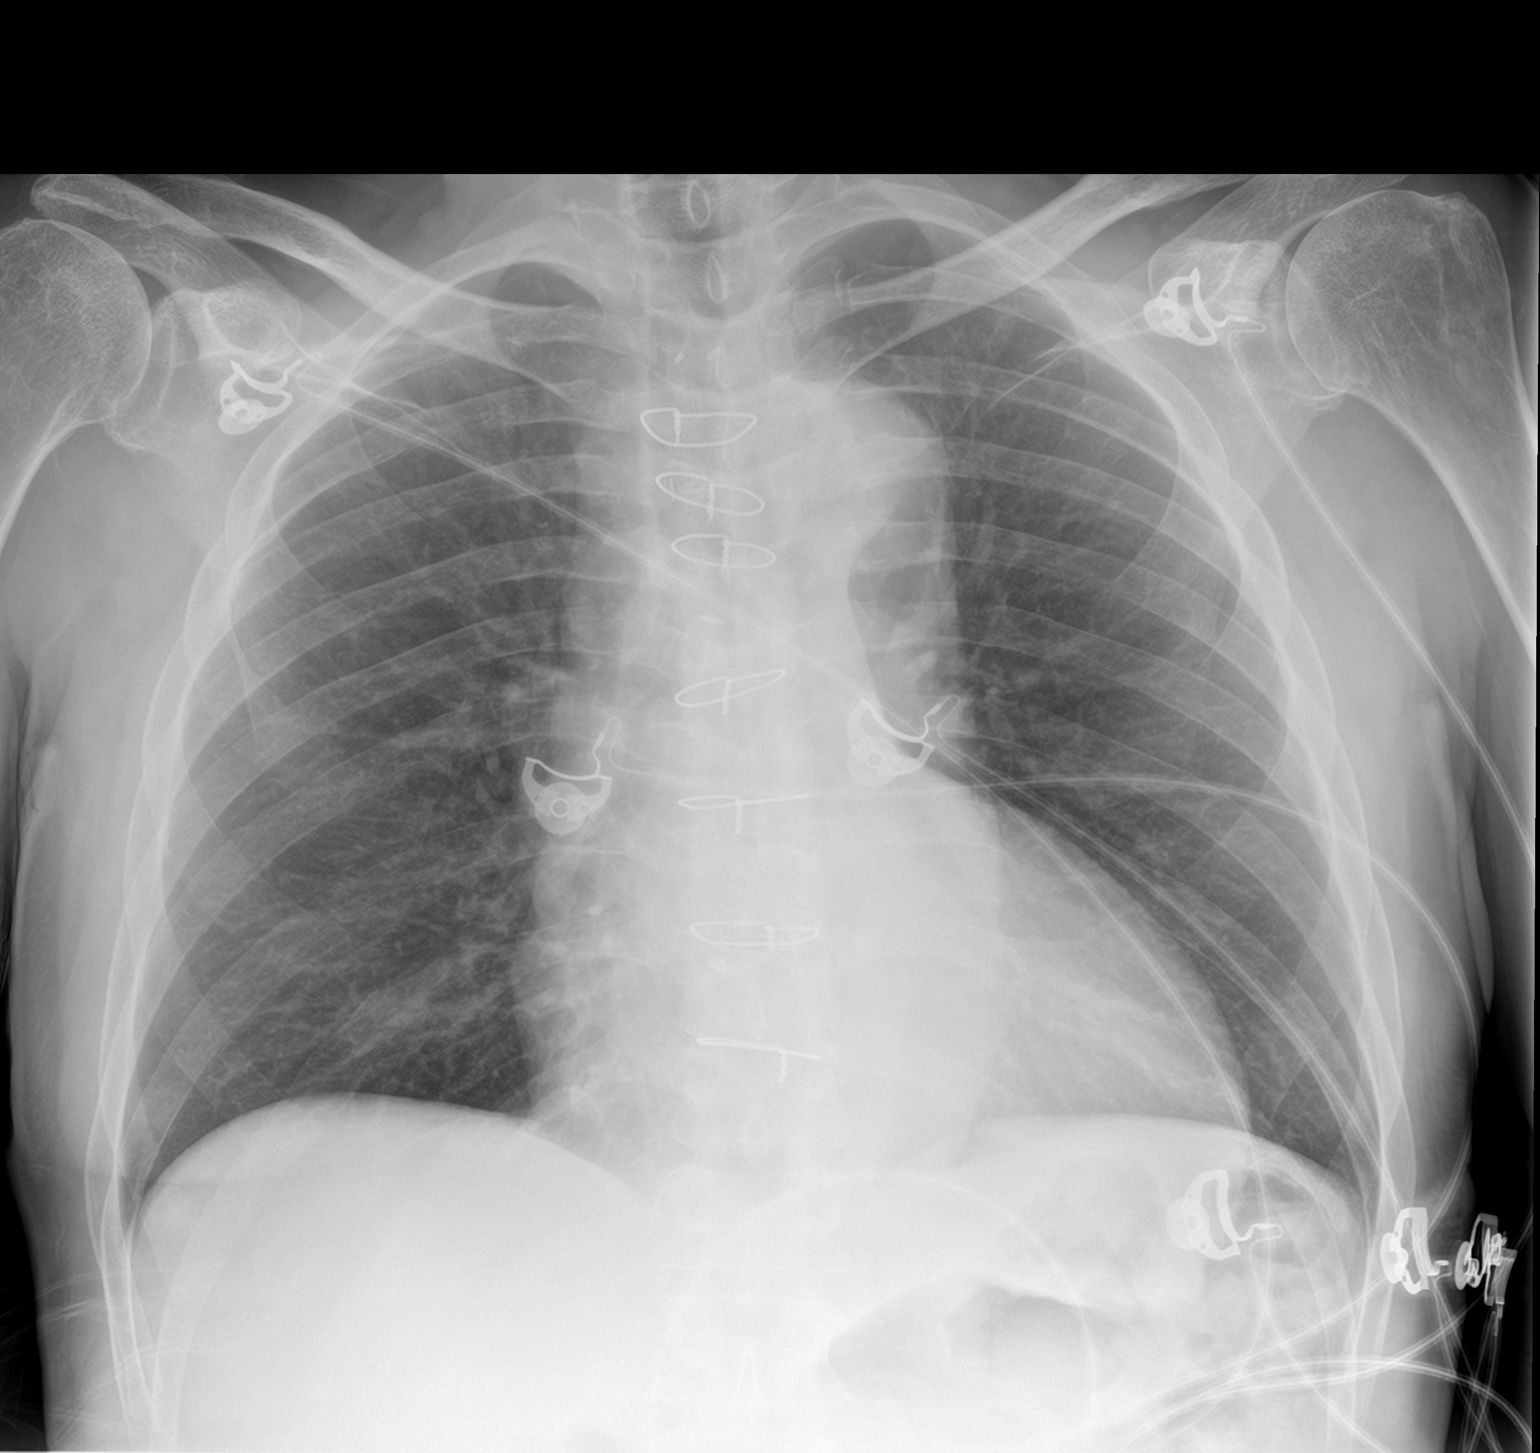

[2 of 2 positions shown; findings below may reference images not displayed]

FINDINGS: There is no edema or consolidation. Heart is borderline enlarged
with pulmonary vascularity within normal limits. Patient is status
post aortic valve replacement. Prominence of the thoracic aorta is
stable. No adenopathy. No bone lesions. No pneumothorax.
IMPRESSION: No edema or consolidation. Heart borderline enlarged. Status post
aortic valve replacement. Prominence of the aorta is likely due to
chronic hypertensive change.

## 2019-03-04 ENCOUNTER — Ambulatory Visit (INDEPENDENT_AMBULATORY_CARE_PROVIDER_SITE_OTHER): Payer: Medicaid Other | Admitting: Family Medicine

## 2019-03-04 ENCOUNTER — Other Ambulatory Visit: Payer: Self-pay

## 2019-03-04 ENCOUNTER — Encounter: Payer: Self-pay | Admitting: Family Medicine

## 2019-03-04 VITALS — BP 144/92 | HR 66 | Temp 97.3°F

## 2019-03-04 DIAGNOSIS — Z23 Encounter for immunization: Secondary | ICD-10-CM | POA: Diagnosis not present

## 2019-03-04 DIAGNOSIS — S81811A Laceration without foreign body, right lower leg, initial encounter: Secondary | ICD-10-CM | POA: Diagnosis not present

## 2019-03-04 MED ORDER — CEPHALEXIN 500 MG PO CAPS: 500.0000 mg | ORAL_CAPSULE | Freq: Two times a day (BID) | ORAL | 0 refills | Status: AC

## 2019-03-04 NOTE — Progress Notes (Signed)
Subjective:  Patient ID: Kirk Oconnor, male    DOB: 12/05/1955, 63 y.o.   MRN: [id number]  Chief Complaint:  Extremity Laceration (R lower leg)   HPI: Kirk Oconnor is a 63 y.o. male presenting on 03/04/2019 for Extremity Laceration (R lower leg)  Pt presents to the office for a laceration to his right lower extremity. States he dropped a razor knife on his leg while working. Bleeding controlled. Unaware of when last Td vaccine received.   Laceration   The incident occurred less than 1 hour ago. The laceration is located on the right leg. The laceration is 8 cm in size. The laceration mechanism was a clean knife. The pain is at a severity of 7/10. The pain is moderate. The pain has been constant since onset. He reports no foreign bodies present. His tetanus status is unknown.      Relevant past medical, surgical, family, and social history reviewed and updated as indicated.  Allergies and medications reviewed and updated.   Past Medical History:  Diagnosis Date  . Aortic regurgitation 03/2017   27 mm Medtronic freestyle porcine root with direct reimplantation of the left and right coronary ostia, replacement of ascending aorta with a 28 mm Gelweave Dacron graft - [facility]  . 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 CAD (coronary artery disease)    Nonobstructive at cardiac catheterization July 2018 [facility]  . Carpal tunnel syndrome   . Essential hypertension   . Hepatitis C   . Sleep apnea     Past Surgical History:  Procedure Laterality Date  . AORTIC VALVE REPLACEMENT  03/2017   [facility]    Social History   Socioeconomic History  . Marital status: Divorced    Spouse name: Not on file  . Number of children: Not on file  . Years of education: Not on file  . Highest education level: Not on file  Occupational History  . Not on file  Social Needs  . Financial resource strain: Not on file  . Food insecurity:    Worry: Not on file    Inability: Not on file   . Transportation needs:    Medical: Not on file    Non-medical: Not on file  Tobacco Use  . Smoking status: Former Games developermoker  . Smokeless tobacco: Never Used  Substance and Sexual Activity  . Alcohol use: No  . Drug use: No  . Sexual activity: Yes  Lifestyle  . Physical activity:    Days per week: Not on file    Minutes per session: Not on file  . Stress: Not on file  Relationships  . Social connections:    Talks on phone: Not on file    Gets together: Not on file    Attends religious service: Not on file    Active member of club or organization: Not on file    Attends meetings of clubs or organizations: Not on file    Relationship status: Not on file  . Intimate partner violence:    Fear of current or ex partner: Not on file    Emotionally abused: Not on file    Physically abused: Not on file    Forced sexual activity: Not on file  Other Topics Concern  . Not on file  Social History Narrative  . Not on file    Outpatient Encounter Medications as of 03/04/2019  Medication Sig  . albuterol (PROVENTIL HFA;VENTOLIN HFA) 108 (90 Base) MCG/ACT inhaler  Inhale 2 puffs into the lungs every 6 (six) hours as needed for wheezing or shortness of breath. (Patient not taking: Reported on 03/04/2019)  . amLODipine (NORVASC) 10 MG tablet Take 1 tablet (10 mg total) by mouth daily. (Patient not taking: Reported on 03/04/2019)  . aspirin EC 81 MG EC tablet Take 1 tablet (81 mg total) by mouth daily. (Patient not taking: Reported on 03/04/2019)  . carvedilol (COREG) 25 MG tablet Take 1 tablet (25 mg total) by mouth 2 (two) times daily with a meal. (Patient not taking: Reported on 03/04/2019)  . cephALEXin (KEFLEX) 500 MG capsule Take 1 capsule (500 mg total) by mouth 2 (two) times daily for 5 days.  Marland Kitchen gabapentin (NEURONTIN) 300 MG capsule Take 1 capsule (300 mg total) by mouth 3 (three) times daily. (Patient not taking: Reported on 03/04/2019)  . hydrocortisone cream 1 % Apply to affected area 2 times daily  (Patient not taking: Reported on 03/04/2019)  . hydrOXYzine (ATARAX/VISTARIL) 50 MG tablet Take 50 mg by mouth 2 (two) times daily.  Marland Kitchen lisinopril (PRINIVIL,ZESTRIL) 10 MG tablet Take 1 tablet (10 mg total) by mouth daily. (Patient not taking: Reported on 03/04/2019)  . lisinopril (PRINIVIL,ZESTRIL) 10 MG tablet Take 1 tablet (10 mg total) by mouth daily. (Patient not taking: Reported on 03/04/2019)  . methocarbamol (ROBAXIN) 500 MG tablet Take 1 tablet (500 mg total) by mouth every 8 (eight) hours as needed for muscle spasms. (Patient not taking: Reported on 03/04/2019)  . pantoprazole (PROTONIX) 40 MG tablet Take 1 tablet (40 mg total) by mouth daily. (Patient not taking: Reported on 03/04/2019)  . potassium chloride (K-DUR) 10 MEQ tablet Take 1 tablet (10 mEq total) by mouth daily. (Patient not taking: Reported on 03/04/2019)  . triamcinolone cream (KENALOG) 0.1 % Apply 1 application topically 2 (two) times daily. (Patient not taking: Reported on 03/04/2019)   No facility-administered encounter medications on file as of 03/04/2019.     No Known Allergies  Review of Systems  Constitutional: Negative for chills, fatigue and fever.  Respiratory: Negative for cough and shortness of breath.   Cardiovascular: Negative for chest pain, palpitations and leg swelling.  Skin: Positive for wound.  Neurological: Negative for dizziness, syncope, weakness, light-headedness and headaches.  Psychiatric/Behavioral: Negative for confusion.  All other systems reviewed and are negative.       Objective:  BP (!) 144/92   Pulse 66   Temp (!) 97.3 F (36.3 C) (Oral)    Wt Readings from Last 3 Encounters:  07/14/17 154 lb 12.2 oz (70.2 kg)  07/11/17 152 lb (68.9 kg)  06/28/17 155 lb (70.3 kg)    Physical Exam Vitals signs and nursing note reviewed.  Constitutional:      General: He is not in acute distress.    Appearance: Normal appearance. He is well-developed and well-groomed. He is not ill-appearing,  toxic-appearing or diaphoretic.  HENT:     Head: Normocephalic and atraumatic.     Jaw: There is normal jaw occlusion.     Right Ear: Hearing normal.     Left Ear: Hearing normal.     Nose: Nose normal.     Mouth/Throat:     Lips: Pink.     Mouth: Mucous membranes are moist.     Pharynx: Oropharynx is clear. Uvula midline.  Eyes:     General: Lids are normal.     Extraocular Movements: Extraocular movements intact.     Conjunctiva/sclera: Conjunctivae normal.     Pupils: Pupils  are equal, round, and reactive to light.  Neck:     Musculoskeletal: Normal range of motion and neck supple.     Thyroid: No thyroid mass, thyromegaly or thyroid tenderness.     Vascular: No carotid bruit or JVD.     Trachea: Trachea and phonation normal.  Cardiovascular:     Rate and Rhythm: Normal rate and regular rhythm.     Chest Wall: PMI is not displaced.     Pulses: Normal pulses.     Heart sounds: Normal heart sounds. No murmur. No friction rub. No gallop.   Pulmonary:     Effort: Pulmonary effort is normal. No respiratory distress.     Breath sounds: Normal breath sounds. No wheezing.  Abdominal:     General: Bowel sounds are normal. There is no distension or abdominal bruit.     Palpations: Abdomen is soft. There is no hepatomegaly or splenomegaly.     Tenderness: There is no abdominal tenderness. There is no right CVA tenderness or left CVA tenderness.     Hernia: No hernia is present.  Musculoskeletal: Normal range of motion.     Right lower leg: No edema.     Left lower leg: No edema.  Lymphadenopathy:     Cervical: No cervical adenopathy.  Skin:    General: Skin is warm and dry.     Capillary Refill: Capillary refill takes less than 2 seconds.     Coloration: Skin is not cyanotic, jaundiced or pale.     Findings: No rash.       Neurological:     General: No focal deficit present.     Mental Status: He is alert and oriented to person, place, and time.     Cranial Nerves: Cranial  nerves are intact.     Sensory: Sensation is intact.     Motor: Motor function is intact.     Coordination: Coordination is intact.     Gait: Gait is intact.     Deep Tendon Reflexes: Reflexes are normal and symmetric.  Psychiatric:        Attention and Perception: Attention and perception normal.        Mood and Affect: Mood and affect normal.        Speech: Speech normal.        Behavior: Behavior normal. Behavior is cooperative.        Thought Content: Thought content normal.        Cognition and Memory: Cognition and memory normal.        Judgment: Judgment normal.      Results for orders placed or performed during the hospital encounter of 07/11/17  MRSA PCR Screening  Result Value Ref Range   MRSA by PCR NEGATIVE NEGATIVE  Basic metabolic panel  Result Value Ref Range   Sodium 137 135 - 145 mmol/L   Potassium 3.6 3.5 - 5.1 mmol/L   Chloride 106 101 - 111 mmol/L   CO2 24 22 - 32 mmol/L   Glucose, Bld 147 (H) 65 - 99 mg/dL   BUN 10 6 - 20 mg/dL   Creatinine, Ser 1.611.12 0.61 - 1.24 mg/dL   Calcium 8.9 8.9 - 09.610.3 mg/dL   GFR calc non Af Amer >60 >60 mL/min   GFR calc Af Amer >60 >60 mL/min   Anion gap 7 5 - 15  CBC  Result Value Ref Range   WBC 5.0 4.0 - 10.5 K/uL   RBC 4.54 4.22 - 5.81 MIL/uL  Hemoglobin 13.9 13.0 - 17.0 g/dL   HCT 16.142.0 09.639.0 - 04.552.0 %   MCV 92.5 78.0 - 100.0 fL   MCH 30.6 26.0 - 34.0 pg   MCHC 33.1 30.0 - 36.0 g/dL   RDW 40.916.0 (H) 81.111.5 - 91.415.5 %   Platelets 128 (L) 150 - 400 K/uL  Troponin I  Result Value Ref Range   Troponin I <0.03 <0.03 ng/mL  HIV antibody (Routine Testing)  Result Value Ref Range   HIV Screen 4th Generation wRfx Non Reactive Non Reactive  Urinalysis, Routine w reflex microscopic  Result Value Ref Range   Color, Urine YELLOW YELLOW   APPearance CLEAR CLEAR   Specific Gravity, Urine 1.011 1.005 - 1.030   pH 6.0 5.0 - 8.0   Glucose, UA NEGATIVE NEGATIVE mg/dL   Hgb urine dipstick NEGATIVE NEGATIVE   Bilirubin Urine NEGATIVE  NEGATIVE   Ketones, ur NEGATIVE NEGATIVE mg/dL   Protein, ur NEGATIVE NEGATIVE mg/dL   Nitrite NEGATIVE NEGATIVE   Leukocytes, UA NEGATIVE NEGATIVE  CBC with Differential/Platelet  Result Value Ref Range   WBC 6.2 4.0 - 10.5 K/uL   RBC 4.71 4.22 - 5.81 MIL/uL   Hemoglobin 14.2 13.0 - 17.0 g/dL   HCT 78.244.6 95.639.0 - 21.352.0 %   MCV 94.7 78.0 - 100.0 fL   MCH 30.1 26.0 - 34.0 pg   MCHC 31.8 30.0 - 36.0 g/dL   RDW 08.616.5 (H) 57.811.5 - 46.915.5 %   Platelets 148 (L) 150 - 400 K/uL   Neutrophils Relative % 44 %   Neutro Abs 2.7 1.7 - 7.7 K/uL   Lymphocytes Relative 46 %   Lymphs Abs 2.8 0.7 - 4.0 K/uL   Monocytes Relative 8 %   Monocytes Absolute 0.5 0.1 - 1.0 K/uL   Eosinophils Relative 2 %   Eosinophils Absolute 0.1 0.0 - 0.7 K/uL   Basophils Relative 0 %   Basophils Absolute 0.0 0.0 - 0.1 K/uL  I-stat troponin, ED  Result Value Ref Range   Troponin i, poc 0.01 0.00 - 0.08 ng/mL   Comment 3          I-stat troponin, ED  Result Value Ref Range   Troponin i, poc 0.02 0.00 - 0.08 ng/mL   Comment 3          ECHOCARDIOGRAM COMPLETE  Result Value Ref Range   Weight 2,430.35 oz   Height 66 in   BP 147/104 mmHg      Laceration repair Date/Time: 03/04/2019 12:00 PM Performed by: Sonny Mastersakes, Linda M, FNP Authorized by: Sonny Mastersakes, Linda M, FNP   Consent:    Consent obtained:  Verbal   Consent given by:  Patient   Risks discussed:  Infection, pain, retained foreign body, tendon damage, vascular damage, poor wound healing, poor cosmetic result, need for additional repair and nerve damage   Alternatives discussed:  Referral Anesthesia (see MAR for exact dosages):    Anesthesia method:  Local infiltration   Local anesthetic:  Lidocaine 2% WITH epi Laceration details:    Location:  Leg   Leg location:  R lower leg   Length (cm):  8   Depth (mm):  1 Repair type:    Repair type:  Simple Pre-procedure details:    Preparation:  Patient was prepped and draped in usual sterile fashion Exploration:     Hemostasis achieved with:  Epinephrine and direct pressure   Wound exploration: wound explored through full range of motion and entire depth of wound probed and  visualized     Wound extent: no areolar tissue violation noted, no fascia violation noted, no foreign bodies/material noted, no muscle damage noted, no nerve damage noted, no tendon damage noted, no underlying fracture noted and no vascular damage noted     Contaminated: no   Treatment:    Area cleansed with:  Betadine and saline   Amount of cleaning:  Standard   Irrigation solution:  Sterile saline   Irrigation volume:  50 ml   Irrigation method:  Syringe   Visualized foreign bodies/material removed: no   Skin repair:    Repair method:  Sutures   Suture size:  3-0   Suture material:  Nylon   Suture technique:  Simple interrupted   Number of sutures:  12 Approximation:    Approximation:  Close Post-procedure details:    Dressing:  Antibiotic ointment, non-adherent dressing and bulky dressing   Patient tolerance of procedure:  Tolerated well, no immediate complications      Pertinent labs & imaging results that were available during my care of the patient were reviewed by me and considered in my medical decision making.  Assessment & Plan:  Kirk Oconnor was seen today for extremity laceration.  Diagnoses and all orders for this visit:  Laceration of right lower extremity, initial encounter Wound came together nicely with 12 sutures. Pt tolerated procedure well. Will empirically treat with keflex. Wound care discussed in detail. Suture removal in 7-10 days. Td updated. -     cephALEXin (KEFLEX) 500 MG capsule; Take 1 capsule (500 mg total) by mouth 2 (two) times daily for 5 days. -     Td : Tetanus/diphtheria >7yo Preservative  free  Need for vaccine for Td (tetanus-diphtheria) -     Td : Tetanus/diphtheria >7yo Preservative  free     Continue all other maintenance medications.  Follow up plan: Return in about 10 days  (around 03/14/2019) for Suture removal.  Educational handout given for laceration and wound care. Suture removal in 7-10 days.   The above assessment and management plan was discussed with the patient. The patient verbalized understanding of and has agreed to the management plan. Patient is aware to call the clinic if symptoms persist or worsen. Patient is aware when to return to the clinic for a follow-up visit. Patient educated on when it is appropriate to go to the emergency department.   Monia Pouch, FNP-C [facility] [phone]

## 2019-03-04 NOTE — Patient Instructions (Addendum)
Suture removal in 7-10 days    Wound Care, Adult Taking care of your wound properly can help to prevent pain, infection, and scarring. It can also help your wound to heal more quickly. How to care for your wound Wound care      Follow instructions from your health care provider about how to take care of your wound. Make sure you: ? Wash your hands with soap and water before you change the bandage (dressing). If soap and water are not available, use hand sanitizer. ? Change your dressing as told by your health care provider. ? Leave stitches (sutures), skin glue, or adhesive strips in place. These skin closures may need to stay in place for 2 weeks or longer. If adhesive strip edges start to loosen and curl up, you may trim the loose edges. Do not remove adhesive strips completely unless your health care provider tells you to do that.  Check your wound area every day for signs of infection. Check for: ? Redness, swelling, or pain. ? Fluid or blood. ? Warmth. ? Pus or a bad smell.  Ask your health care provider if you should clean the wound with mild soap and water. Doing this may include: ? Using a clean towel to pat the wound dry after cleaning it. Do not rub or scrub the wound. ? Applying a cream or ointment. Do this only as told by your health care provider. ? Covering the incision with a clean dressing.  Ask your health care provider when you can leave the wound uncovered.  Keep the dressing dry until your health care provider says it can be removed. Do not take baths, swim, use a hot tub, or do anything that would put the wound underwater until your health care provider approves. Ask your health care provider if you can take showers. You may only be allowed to take sponge baths. Medicines   If you were prescribed an antibiotic medicine, cream, or ointment, take or use the antibiotic as told by your health care provider. Do not stop taking or using the antibiotic even if your  condition improves.  Take over-the-counter and prescription medicines only as told by your health care provider. If you were prescribed pain medicine, take it 30 or more minutes before you do any wound care or as told by your health care provider. General instructions  Return to your normal activities as told by your health care provider. Ask your health care provider what activities are safe.  Do not scratch or pick at the wound.  Do not use any products that contain nicotine or tobacco, such as cigarettes and e-cigarettes. These may delay wound healing. If you need help quitting, ask your health care provider.  Keep all follow-up visits as told by your health care provider. This is important.  Eat a diet that includes protein, vitamin A, vitamin C, and other nutrient-rich foods to help the wound heal. ? Foods rich in protein include meat, dairy, beans, nuts, and other sources. ? Foods rich in vitamin A include carrots and dark green, leafy vegetables. ? Foods rich in vitamin C include citrus, tomatoes, and other fruits and vegetables. ? Nutrient-rich foods have protein, carbohydrates, fat, vitamins, or minerals. Eat a variety of healthy foods including vegetables, fruits, and whole grains. Contact a health care provider if:  You received a tetanus shot and you have swelling, severe pain, redness, or bleeding at the injection site.  Your pain is not controlled with medicine.  You have  redness, swelling, or pain around the wound.  You have fluid or blood coming from the wound.  Your wound feels warm to the touch.  You have pus or a bad smell coming from the wound.  You have a fever or chills.  You are nauseous or you vomit.  You are dizzy. Get help right away if:  You have a red streak going away from your wound.  The edges of the wound open up and separate.  Your wound is bleeding, and the bleeding does not stop with gentle pressure.  You have a rash.  You faint.   You have trouble breathing. Summary  Always wash your hands with soap and water before changing your bandage (dressing).  To help with healing, eat foods that are rich in protein, vitamin A, vitamin C, and other nutrients.  Check your wound every day for signs of infection. Contact your health care provider if you suspect that your wound is infected. This information is not intended to replace advice given to you by your health care provider. Make sure you discuss any questions you have with your health care provider. Document Released: 06/21/2008 Document Revised: 10/24/2017 Document Reviewed: 03/29/2016 Elsevier Interactive Patient Education  2019 [employer] Laceration Care, Adult A laceration is a cut that may go through all layers of the skin. The cut may also go into the tissue that is right under the skin. Some cuts heal on their own. Others need to be closed with stitches (sutures), staples, skin adhesive strips, or skin glue. Taking care of your injury lowers your risk of infection, helps your injury to heal better, and may prevent scarring. Supplies needed:  Soap.  Water.  Hand sanitizer.  Bandage (dressing).  Antibiotic ointment.  Clean towel. How to take care of your cut Wash your hands with soap and water before touching your wound or changing your bandage. If soap and water are not available, use hand sanitizer. If your doctor used stitches or staples:  Keep the wound clean and dry.  If you were given a bandage, change it at least once a day as told by your doctor. You should also change it if it gets wet or dirty.  Keep the wound completely dry for the first 24 hours, or as told by your doctor. After that, you may take a shower or a bath. Do not get the wound soaked in water until after the stitches or staples have been removed.  Clean the wound once a day, or as told by your doctor: ? Wash the wound with soap and water. ? Rinse the wound with water to remove  all soap. ? Pat the wound dry with a clean towel. Do not rub the wound.  After you clean the wound, put a thin layer of antibiotic ointment on it as told by your doctor. This ointment: ? Helps to prevent infection. ? Keeps the bandage from sticking to the wound.  Have your stitches or staples removed as told by your doctor. If your doctor used skin adhesive strips:  Keep the wound clean and dry.  If you were given a bandage, you should change it at least once a day as told by your doctor. You should also change it if it gets wet or dirty.  Do not get the skin adhesive strips wet. You can take a shower or a bath, but keep the wound dry.  If the wound gets wet, pat it dry with a clean towel. Do not rub  the wound.  Skin adhesive strips fall off on their own. You can trim the strips as the wound heals. Do not remove any strips that are still stuck to the wound. They will fall off after a while. If your doctor used skin glue:  Try to keep your wound dry, but you may briefly wet it in the shower or bath. Do not soak the wound in water, such as by swimming.  After you take a shower or a bath, gently pat the wound dry with a clean towel. Do not rub the wound.  Do not do any activities that will make you really sweaty until the skin glue has fallen off on its own.  Do not apply liquid, cream, or ointment medicine to your wound while the skin glue is still on.  If you were given a bandage, you should change it at least once a day or as told by your doctor. You should also change it if it gets dirty or wet.  If a bandage is placed over the wound, do not let the tape touch the skin glue.  Do not pick at the glue. The skin glue usually stays on for 5-10 days. Then, it falls off the skin. General instructions   Take over-the-counter and prescription medicines only as told by your doctor.  If you were given antibiotic medicine or ointment, take or apply it as told by your doctor. Do not stop  using it even if your condition improves.  Do not scratch or pick at the wound.  Check your wound every day for signs of infection. Watch for: ? Redness, swelling, or pain. ? Fluid, blood, or pus.  Raise (elevate) the injured area above the level of your heart while you are sitting or lying down.  If directed, put ice on the affected area: ? Put ice in a plastic bag. ? Place a towel between your skin and the bag. ? Leave the ice on for 20 minutes, 2-3 times a day.  Prevent scarring by covering your wound with sunscreen of at least 30 SPF whenever you are outside after your wound has healed.  Keep all follow-up visits as told by your doctor. This is important. Get help if:  You got a tetanus shot and you have any of these problems at the injection site: ? Swelling. ? Very bad pain. ? Redness. ? Bleeding.  You have a fever.  A wound that was closed breaks open.  You notice a bad smell coming from your wound or your bandage.  You notice something coming out of the wound, such as wood or glass.  Medicine does not relieve your pain.  You have more redness, swelling, or pain at the site of your wound.  You have fluid, blood, or pus coming from your wound.  You notice a change in the color of your skin near your wound.  You need to change the bandage often because fluid, blood, or pus is coming from the wound.  You start to have a new rash.  You start to have numbness around the wound. Get help right away if:  You have very bad swelling around the wound.  Your pain suddenly gets worse and is very bad.  You notice painful lumps near the wound or anywhere on your body.  You have a red streak going away from your wound.  The wound is on your hand or foot, and: ? You cannot move a finger or toe. ? Your fingers or  toes look pale or bluish. Summary  A laceration is a cut that may go through all layers of the skin. The cut may also go into the tissue right under the  skin.  Some cuts heal on their own. Others need to be closed with stitches, staples, skin adhesive strips, or skin glue.  Follow your doctor's instructions for caring for your cut. Proper care of a cut lowers the risk of infection, helps the cut heal better, and prevents scarring. This information is not intended to replace advice given to you by your health care provider. Make sure you discuss any questions you have with your health care provider. Document Released: 02/29/2008 Document Revised: 10/02/2017 Document Reviewed: 10/02/2017 Elsevier Interactive Patient Education  2019 [employer]

## 2019-03-11 ENCOUNTER — Ambulatory Visit (INDEPENDENT_AMBULATORY_CARE_PROVIDER_SITE_OTHER): Payer: Medicaid Other | Admitting: Family Medicine

## 2019-03-11 ENCOUNTER — Encounter: Payer: Self-pay | Admitting: Family Medicine

## 2019-03-11 ENCOUNTER — Other Ambulatory Visit: Payer: Self-pay

## 2019-03-11 VITALS — BP 127/83 | HR 75 | Temp 98.3°F | Ht 66.0 in | Wt 156.4 lb

## 2019-03-11 DIAGNOSIS — L03115 Cellulitis of right lower limb: Secondary | ICD-10-CM | POA: Diagnosis not present

## 2019-03-11 DIAGNOSIS — Z4802 Encounter for removal of sutures: Secondary | ICD-10-CM | POA: Diagnosis not present

## 2019-03-11 MED ORDER — CEFTRIAXONE SODIUM 1 G IJ SOLR
1.0000 g | Freq: Once | INTRAMUSCULAR | Status: AC
  Administered 2019-03-11: 1 g via INTRAMUSCULAR

## 2019-03-11 MED ORDER — CEPHALEXIN 500 MG PO CAPS: 500.0000 mg | ORAL_CAPSULE | Freq: Two times a day (BID) | ORAL | 0 refills | Status: DC

## 2019-03-11 NOTE — Progress Notes (Signed)
Subjective:  Patient ID: Kirk Oconnor, male    DOB: 02/22/1956, 63 y.o.   MRN: [id number]  Chief Complaint:  Suture / Staple Removal   HPI: Kirk Oconnor is a 63 y.o. male presenting on 03/11/2019 for Suture / Staple Removal  Pt presents today for suture removal. Pt states he did not get the Keflex filled due to cost. Pt states he has not been washing and dressing the wound daily as discussed. Pt denies fever, chills, weakness, confusion, or pain. No other associated symptoms.    Relevant past medical, surgical, family, and social history reviewed and updated as indicated.  Allergies and medications reviewed and updated.   Past Medical History:  Diagnosis Date  . Aortic regurgitation 03/2017   27 mm Medtronic freestyle porcine root with direct reimplantation of the left and right coronary ostia, replacement of ascending aorta with a 28 mm Gelweave Dacron graft - [facility]  . 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 CAD (coronary artery disease)    Nonobstructive at cardiac catheterization July 2018 [facility] [place]  . Carpal tunnel syndrome   . Essential hypertension   . Hepatitis C   . Sleep apnea     Past Surgical History:  Procedure Laterality Date  . AORTIC VALVE REPLACEMENT  03/2017   [facility]    Social History   Socioeconomic History  . Marital status: Divorced    Spouse name: Not on file  . Number of children: Not on file  . Years of education: Not on file  . Highest education level: Not on file  Occupational History  . Not on file  Social Needs  . Financial resource strain: Not on file  . Food insecurity    Worry: Not on file    Inability: Not on file  . Transportation needs    Medical: Not on file    Non-medical: Not on file  Tobacco Use  . Smoking status: Former Games developermoker  . Smokeless tobacco: Never Used  Substance and Sexual Activity  . Alcohol use: No  . Drug use: No  . Sexual activity: Yes  Lifestyle  . Physical activity    Days per week: Not  on file    Minutes per session: Not on file  . Stress: Not on file  Relationships  . Social Musicianconnections    Talks on phone: Not on file    Gets together: Not on file    Attends religious service: Not on file    Active member of club or organization: Not on file    Attends meetings of clubs or organizations: Not on file    Relationship status: Not on file  . Intimate partner violence    Fear of current or ex partner: Not on file    Emotionally abused: Not on file    Physically abused: Not on file    Forced sexual activity: Not on file  Other Topics Concern  . Not on file  Social History Narrative  . Not on file    Outpatient Encounter Medications as of 03/11/2019  Medication Sig  . albuterol (PROVENTIL HFA;VENTOLIN HFA) 108 (90 Base) MCG/ACT inhaler Inhale 2 puffs into the lungs every 6 (six) hours as needed for wheezing or shortness of breath. (Patient not taking: Reported on 03/04/2019)  . amLODipine (NORVASC) 10 MG tablet Take 1 tablet (10 mg total) by mouth daily. (Patient not taking: Reported on 03/04/2019)  . aspirin EC 81 MG EC tablet Take 1 tablet (81  mg total) by mouth daily. (Patient not taking: Reported on 03/04/2019)  . carvedilol (COREG) 25 MG tablet Take 1 tablet (25 mg total) by mouth 2 (two) times daily with a meal. (Patient not taking: Reported on 03/04/2019)  . cephALEXin (KEFLEX) 500 MG capsule Take 1 capsule (500 mg total) by mouth 2 (two) times daily.  Marland Kitchen gabapentin (NEURONTIN) 300 MG capsule Take 1 capsule (300 mg total) by mouth 3 (three) times daily. (Patient not taking: Reported on 03/04/2019)  . hydrocortisone cream 1 % Apply to affected area 2 times daily (Patient not taking: Reported on 03/04/2019)  . hydrOXYzine (ATARAX/VISTARIL) 50 MG tablet Take 50 mg by mouth 2 (two) times daily.  Marland Kitchen lisinopril (PRINIVIL,ZESTRIL) 10 MG tablet Take 1 tablet (10 mg total) by mouth daily. (Patient not taking: Reported on 03/04/2019)  . lisinopril (PRINIVIL,ZESTRIL) 10 MG tablet Take 1  tablet (10 mg total) by mouth daily. (Patient not taking: Reported on 03/04/2019)  . methocarbamol (ROBAXIN) 500 MG tablet Take 1 tablet (500 mg total) by mouth every 8 (eight) hours as needed for muscle spasms. (Patient not taking: Reported on 03/04/2019)  . pantoprazole (PROTONIX) 40 MG tablet Take 1 tablet (40 mg total) by mouth daily. (Patient not taking: Reported on 03/04/2019)  . potassium chloride (K-DUR) 10 MEQ tablet Take 1 tablet (10 mEq total) by mouth daily. (Patient not taking: Reported on 03/04/2019)  . triamcinolone cream (KENALOG) 0.1 % Apply 1 application topically 2 (two) times daily. (Patient not taking: Reported on 03/04/2019)  . [EXPIRED] cefTRIAXone (ROCEPHIN) injection 1 g    No facility-administered encounter medications on file as of 03/11/2019.     No Known Allergies  Review of Systems  Constitutional: Negative for chills, fatigue and fever.  Respiratory: Negative for cough and shortness of breath.   Cardiovascular: Negative for chest pain, palpitations and leg swelling.  Skin: Positive for color change and wound.  Neurological: Negative for dizziness, weakness, light-headedness and headaches.  Psychiatric/Behavioral: Negative for confusion.  All other systems reviewed and are negative.       Objective:  BP 127/83   Pulse 75   Temp 98.3 F (36.8 C) (Oral)   Ht 5\' 6"  (1.676 m)   Wt 156 lb 6.4 oz (70.9 kg)   BMI 25.24 kg/m    Wt Readings from Last 3 Encounters:  03/11/19 156 lb 6.4 oz (70.9 kg)  07/14/17 154 lb 12.2 oz (70.2 kg)  07/11/17 152 lb (68.9 kg)    Physical Exam Vitals signs and nursing note reviewed.  Constitutional:      General: He is not in acute distress.    Appearance: Normal appearance. He is not ill-appearing or toxic-appearing.  HENT:     Head: Normocephalic and atraumatic.  Eyes:     Conjunctiva/sclera: Conjunctivae normal.     Pupils: Pupils are equal, round, and reactive to light.  Cardiovascular:     Rate and Rhythm: Normal rate  and regular rhythm.     Pulses: Normal pulses.     Heart sounds: Normal heart sounds. No murmur. No friction rub. No gallop.   Pulmonary:     Effort: Pulmonary effort is normal. No respiratory distress.     Breath sounds: Normal breath sounds.  Skin:    General: Skin is warm and dry.     Capillary Refill: Capillary refill takes less than 2 seconds.     Findings: Wound present.       Neurological:     General: No focal deficit present.  Mental Status: He is alert and oriented to person, place, and time.  Psychiatric:        Mood and Affect: Mood normal.        Behavior: Behavior normal.        Thought Content: Thought content normal.        Judgment: Judgment normal.     Results for orders placed or performed during the hospital encounter of 07/11/17  MRSA PCR Screening   Specimen: Nasal Mucosa; Nasopharyngeal  Result Value Ref Range   MRSA by PCR NEGATIVE NEGATIVE  Basic metabolic panel  Result Value Ref Range   Sodium 137 135 - 145 mmol/L   Potassium 3.6 3.5 - 5.1 mmol/L   Chloride 106 101 - 111 mmol/L   CO2 24 22 - 32 mmol/L   Glucose, Bld 147 (H) 65 - 99 mg/dL   BUN 10 6 - 20 mg/dL   Creatinine, Ser 4.80 0.61 - 1.24 mg/dL   Calcium 8.9 8.9 - 16.5 mg/dL   GFR calc non Af Amer >60 >60 mL/min   GFR calc Af Amer >60 >60 mL/min   Anion gap 7 5 - 15  CBC  Result Value Ref Range   WBC 5.0 4.0 - 10.5 K/uL   RBC 4.54 4.22 - 5.81 MIL/uL   Hemoglobin 13.9 13.0 - 17.0 g/dL   HCT 53.7 48.2 - 70.7 %   MCV 92.5 78.0 - 100.0 fL   MCH 30.6 26.0 - 34.0 pg   MCHC 33.1 30.0 - 36.0 g/dL   RDW 86.7 (H) 54.4 - 92.0 %   Platelets 128 (L) 150 - 400 K/uL  Troponin I  Result Value Ref Range   Troponin I <0.03 <0.03 ng/mL  HIV antibody (Routine Testing)  Result Value Ref Range   HIV Screen 4th Generation wRfx Non Reactive Non Reactive  Urinalysis, Routine w reflex microscopic  Result Value Ref Range   Color, Urine YELLOW YELLOW   APPearance CLEAR CLEAR   Specific Gravity,  Urine 1.011 1.005 - 1.030   pH 6.0 5.0 - 8.0   Glucose, UA NEGATIVE NEGATIVE mg/dL   Hgb urine dipstick NEGATIVE NEGATIVE   Bilirubin Urine NEGATIVE NEGATIVE   Ketones, ur NEGATIVE NEGATIVE mg/dL   Protein, ur NEGATIVE NEGATIVE mg/dL   Nitrite NEGATIVE NEGATIVE   Leukocytes, UA NEGATIVE NEGATIVE  CBC with Differential/Platelet  Result Value Ref Range   WBC 6.2 4.0 - 10.5 K/uL   RBC 4.71 4.22 - 5.81 MIL/uL   Hemoglobin 14.2 13.0 - 17.0 g/dL   HCT 10.0 71.2 - 19.7 %   MCV 94.7 78.0 - 100.0 fL   MCH 30.1 26.0 - 34.0 pg   MCHC 31.8 30.0 - 36.0 g/dL   RDW 58.8 (H) 32.5 - 49.8 %   Platelets 148 (L) 150 - 400 K/uL   Neutrophils Relative % 44 %   Neutro Abs 2.7 1.7 - 7.7 K/uL   Lymphocytes Relative 46 %   Lymphs Abs 2.8 0.7 - 4.0 K/uL   Monocytes Relative 8 %   Monocytes Absolute 0.5 0.1 - 1.0 K/uL   Eosinophils Relative 2 %   Eosinophils Absolute 0.1 0.0 - 0.7 K/uL   Basophils Relative 0 %   Basophils Absolute 0.0 0.0 - 0.1 K/uL  I-stat troponin, ED  Result Value Ref Range   Troponin i, poc 0.01 0.00 - 0.08 ng/mL   Comment 3          I-stat troponin, ED  Result Value Ref Range  Troponin i, poc 0.02 0.00 - 0.08 ng/mL   Comment 3          ECHOCARDIOGRAM COMPLETE  Result Value Ref Range   Weight 2,430.35 oz   Height 66 in   BP 147/104 mmHg       Pertinent labs & imaging results that were available during my care of the patient were reviewed by me and considered in my medical decision making.  Assessment & Plan:  Kirk Oconnor was seen today for suture / staple removal.  Diagnoses and all orders for this visit:  Visit for suture removal 12 sutures removed. Wound has surrounding erythema. Pt did not get keflex that was prescribed and did not wash wound daily as discussed.   Cellulitis of right lower extremity Cellulitis around wound. Pt states he is unable to afford Keflex. Pt aware of the cost at [facility]. Will give Rocephin in office today. Follow up in one week. Take Keflex  as prescribed. Good RX card provided.  -     cephALEXin (KEFLEX) 500 MG capsule; Take 1 capsule (500 mg total) by mouth 2 (two) times daily. -     cefTRIAXone (ROCEPHIN) injection 1 g     Continue all other maintenance medications.  Follow up plan: Return in about 1 week (around 03/18/2019), or if symptoms worsen or fail to improve, for cellulitis.    The above assessment and management plan was discussed with the patient. The patient verbalized understanding of and has agreed to the management plan. Patient is aware to call the clinic if symptoms persist or worsen. Patient is aware when to return to the clinic for a follow-up visit. Patient educated on when it is appropriate to go to the emergency department.   Kirk BaarsMichelle Rakes, FNP-C [facility] [phone]

## 2019-03-19 ENCOUNTER — Ambulatory Visit (INDEPENDENT_AMBULATORY_CARE_PROVIDER_SITE_OTHER): Payer: Medicaid Other | Admitting: Nurse Practitioner

## 2019-03-19 ENCOUNTER — Other Ambulatory Visit: Payer: Self-pay

## 2019-03-19 ENCOUNTER — Encounter: Payer: Self-pay | Admitting: Nurse Practitioner

## 2019-03-19 VITALS — BP 157/104 | HR 81 | Temp 97.8°F | Ht 66.0 in | Wt 156.0 lb

## 2019-03-19 DIAGNOSIS — Z5189 Encounter for other specified aftercare: Secondary | ICD-10-CM

## 2019-03-19 DIAGNOSIS — Z9114 Patient's other noncompliance with medication regimen: Secondary | ICD-10-CM | POA: Diagnosis not present

## 2019-03-19 DIAGNOSIS — Z91148 Patient's other noncompliance with medication regimen for other reason: Secondary | ICD-10-CM

## 2019-03-19 NOTE — Progress Notes (Signed)
   Subjective:    Patient ID: Kirk Oconnor, male    DOB: 1956/07/31, 63 y.o.   MRN: [id number]   Chief Complaint: Recheck wound on leg   HPI Patient had laceration to right shin area on 03/04/19. Stitches were placed. He returned on 05/11/19 and stitches were removed and steri strips applied. He removed the steristrips after 2 days and now wound has busted open on lower edge.   - he is very noncompliant with medicatoions. Says that he cannot afford his medicines. So he is not taking anything for blod pressure.    Review of Systems  Respiratory: Negative.   Cardiovascular: Negative.   Skin: Positive for wound (right lower leg).  All other systems reviewed and are negative.      Objective:   Physical Exam Vitals signs and nursing note reviewed.  Constitutional:      Appearance: Normal appearance.  Cardiovascular:     Rate and Rhythm: Normal rate and regular rhythm.     Heart sounds: Normal heart sounds.  Pulmonary:     Breath sounds: Normal breath sounds.  Skin:    General: Skin is warm.     Comments: 3cm wound dehiscence erythema or signs of infection  Neurological:     General: No focal deficit present.     Mental Status: He is alert and oriented to person, place, and time.  Psychiatric:        Mood and Affect: Mood normal.        Behavior: Behavior normal.     BP (!) 157/104 (BP Location: Left Arm, Cuff Size: Normal)   Pulse 81   Temp 97.8 F (36.6 C) (Oral)   Ht 5\' 6"  (1.676 m)   Wt 156 lb (70.8 kg)   BMI 25.18 kg/m   Wound care- cleaned with saline, antibiotic ointment applied. 4X4 and coban       Assessment & Plan:  REASON HELZER in today with chief complaint of Recheck wound on leg   1. Visit for wound care Keep wound clean and dry Clean with antibacterial soap BID and put dressing on.  2. Noncompliance with medications Am referring to health department for medication assistance.  Mary-Margaret Hassell Done, FNP

## 2019-04-01 ENCOUNTER — Telehealth: Payer: Self-pay | Admitting: Family Medicine

## 2019-04-02 ENCOUNTER — Ambulatory Visit: Payer: Self-pay | Admitting: Family Medicine

## 2021-12-20 NOTE — Congregational Nurse Program (Signed)
Stated he has history of HTN. Told about the [facility].  Takes BP medication daily BP 160/100  HR-104. Reviewed .  dangers of having hypertension Jenene Slicker RN ?Dept: [phone] ? ? ?Congregational Nurse Program Note ? ?Date of Encounter: 12/20/2021 ? ?Past Medical History: ?Past Medical History:  ?Diagnosis Date  ? Aortic regurgitation 03/2017  ? 27 mm Medtronic freestyle porcine root with direct reimplantation of the left and right coronary ostia, replacement of ascending aorta with a 28 mm Gelweave Dacron graft - [facility]  ? 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 CAD (coronary artery disease)   ? Nonobstructive at cardiac catheterization July 2018 [facility]  ? Carpal tunnel syndrome   ? Essential hypertension   ? Hepatitis C   ? Sleep apnea   ? ? ?Encounter Details: ? CNP Questionnaire - 12/07/21 1800   ? ?  ? Questionnaire  ? Do you give verbal consent to treat you today? Yes   ? Location Patient Served  Home of [address], [place]   ? Visit Setting Church or Organization   ? Patient Status Homeless   ? Insurance Medicaid;Medicare   ? Insurance Referral N/A   ? Medication N/A   ? Medical Provider No   ? Screening Referrals N/A   ? Medical Referral N/A   ? Medical Appointment Made N/A   ? Food Have Food Insecurities   ? Transportation Need transportation assistance   ? Housing/Utilities No permanent housing   ? Interpersonal Safety N/A   ? Intervention Blood pressure   ? ED Visit Averted N/A   ? Life-Saving Intervention Made N/A   ? ?  ?  ? ?  ? ? ? ? ?

## 2022-01-16 NOTE — Congregational Nurse Program (Signed)
?  Dept: [phone] ? ? ?Congregational Nurse Program Note ? ?Date of Encounter: 01/16/2022 ? ?Past Medical History: ?Past Medical History:  ?Diagnosis Date  ? Aortic regurgitation 03/2017  ? 27 mm Medtronic freestyle porcine root with direct reimplantation of the left and right coronary ostia, replacement of ascending aorta with a 28 mm Gelweave Dacron graft - [facility]  ? 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 CAD (coronary artery disease)   ? Nonobstructive at cardiac catheterization July 2018 [facility]  ? Carpal tunnel syndrome   ? Essential hypertension   ? Hepatitis C   ? Sleep apnea   ? ? ?Encounter Details: ?History of Coronary Artery Disease and Hypertension. Complaining of back pain and dizziness. BP 167/100 HR 96 . Instructed on importance of taking medications as prescribed and keeping up with medical appointments. Voiced understanding. Also told patient abou ?t  [facility] and the [facility] but stated he wanted to wait to be seen. Jenene Slicker RN ? ? ?

## 2022-01-26 NOTE — Congregational Nurse Program (Signed)
?  Dept: [phone] ? ? ?Congregational Nurse Program Note ? ?Date of Encounter: 01/16/2022 ? ?Past Medical History: ?Past Medical History:  ?Diagnosis Date  ? Aortic regurgitation 03/2017  ? 27 mm Medtronic freestyle porcine root with direct reimplantation of the left and right coronary ostia, replacement of ascending aorta with a 28 mm Gelweave Dacron graft - [facility]  ? 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 CAD (coronary artery disease)   ? Nonobstructive at cardiac catheterization July 2018 [facility]  ? Carpal tunnel syndrome   ? Essential hypertension   ? Hepatitis C   ? Sleep apnea   ? ? ?Encounter Details: ? ?Stated he is stessed trying to find a place to stay before the end of this month. Asked if he was taking his medications as ordered and he said he forgot to take his morning medications. Reviewed all of his meds with him and explained the importance of taking them as prescribed. Voiced understanding. ?BP 184/128 HR-76. Told him he needed to be seen in the ER and he refused. ?Reviewed signs and symptoms of a stroke and the importance of getting his blood pressure down.  Jenene Slicker  RN  ? ? ? ?

## 2022-01-27 ENCOUNTER — Other Ambulatory Visit (HOSPITAL_COMMUNITY): Payer: Self-pay | Admitting: Gerontology

## 2022-01-27 ENCOUNTER — Ambulatory Visit (HOSPITAL_COMMUNITY)
Admission: RE | Admit: 2022-01-27 | Discharge: 2022-01-27 | Disposition: A | Discharge status: Home / Self Care | Payer: Medicaid Other | Source: Ambulatory Visit | Attending: Gerontology | Admitting: Gerontology

## 2022-01-27 ENCOUNTER — Other Ambulatory Visit (HOSPITAL_COMMUNITY)
Admission: RE | Admit: 2022-01-27 | Discharge: 2022-01-27 | Disposition: A | Discharge status: Home / Self Care | Payer: Medicaid Other | Source: Ambulatory Visit | Attending: Internal Medicine | Admitting: Internal Medicine

## 2022-01-27 DIAGNOSIS — M545 Low back pain, unspecified: Secondary | ICD-10-CM

## 2022-01-27 DIAGNOSIS — Z0001 Encounter for general adult medical examination with abnormal findings: Secondary | ICD-10-CM | POA: Insufficient documentation

## 2022-01-27 LAB — HEPATIC FUNCTION PANEL
ALT: 69 U/L — ABNORMAL HIGH (ref 0–44)
AST: 48 U/L — ABNORMAL HIGH (ref 15–41)
Albumin: 3.5 g/dL (ref 3.5–5.0)
Alkaline Phosphatase: 66 U/L (ref 38–126)
Bilirubin, Direct: 0.1 mg/dL (ref 0.0–0.2)
Indirect Bilirubin: 0.8 mg/dL (ref 0.3–0.9)
Total Bilirubin: 0.9 mg/dL (ref 0.3–1.2)
Total Protein: 7.6 g/dL (ref 6.5–8.1)

## 2022-01-27 LAB — CBC WITH DIFFERENTIAL/PLATELET
Abs Immature Granulocytes: 0.01 10*3/uL (ref 0.00–0.07)
Basophils Absolute: 0.1 10*3/uL (ref 0.0–0.1)
Basophils Relative: 1 %
Eosinophils Absolute: 0.3 10*3/uL (ref 0.0–0.5)
Eosinophils Relative: 4 %
HCT: 43.6 % (ref 39.0–52.0)
Hemoglobin: 14.2 g/dL (ref 13.0–17.0)
Immature Granulocytes: 0 %
Lymphocytes Relative: 41 %
Lymphs Abs: 3 10*3/uL (ref 0.7–4.0)
MCH: 29.6 pg (ref 26.0–34.0)
MCHC: 32.6 g/dL (ref 30.0–36.0)
MCV: 91 fL (ref 80.0–100.0)
Monocytes Absolute: 0.6 10*3/uL (ref 0.1–1.0)
Monocytes Relative: 9 %
Neutro Abs: 3.3 10*3/uL (ref 1.7–7.7)
Neutrophils Relative %: 45 %
Platelets: 187 10*3/uL (ref 150–400)
RBC: 4.79 MIL/uL (ref 4.22–5.81)
RDW: 15 % (ref 11.5–15.5)
WBC: 7.2 10*3/uL (ref 4.0–10.5)
nRBC: 0 % (ref 0.0–0.2)

## 2022-01-27 LAB — BASIC METABOLIC PANEL
Anion gap: 5 (ref 5–15)
BUN: 17 mg/dL (ref 8–23)
CO2: 23 mmol/L (ref 22–32)
Calcium: 9.2 mg/dL (ref 8.9–10.3)
Chloride: 109 mmol/L (ref 98–111)
Creatinine, Ser: 0.98 mg/dL (ref 0.61–1.24)
GFR, Estimated: >60 mL/min (ref >60)
Glucose, Bld: 120 mg/dL — ABNORMAL HIGH (ref 70–99)
Potassium: 3.9 mmol/L (ref 3.5–5.1)
Sodium: 137 mmol/L (ref 135–145)

## 2022-01-27 LAB — LIPID PANEL
Cholesterol: 159 mg/dL (ref 0–200)
HDL: 31 mg/dL — ABNORMAL LOW (ref >40)
LDL Cholesterol: 111 mg/dL — ABNORMAL HIGH (ref 0–99)
Total CHOL/HDL Ratio: 5.1 RATIO
Triglycerides: 85 mg/dL (ref <150)
VLDL: 17 mg/dL (ref 0–40)

## 2022-01-27 LAB — VITAMIN D 25 HYDROXY (VIT D DEFICIENCY, FRACTURES): Vit D, 25-Hydroxy: 35.66 ng/mL (ref 30–100)

## 2022-01-27 LAB — HEMOGLOBIN A1C
Hgb A1c MFr Bld: 7.1 % — ABNORMAL HIGH (ref 4.8–5.6)
Mean Plasma Glucose: 157.07 mg/dL

## 2022-01-27 LAB — HEPATITIS C ANTIBODY: HCV Ab: REACTIVE — AB

## 2022-01-27 LAB — URIC ACID: Uric Acid, Serum: 8.7 mg/dL — ABNORMAL HIGH (ref 3.7–8.6)

## 2022-01-28 LAB — MICROALBUMIN, URINE: Microalb, Ur: 6 ug/mL — ABNORMAL HIGH

## 2022-01-30 NOTE — Congregational Nurse Program (Signed)
?  Dept: [phone] ? ? ?Congregational Nurse Program Note ? ?Date of Encounter: 01/30/2022 ? ?Past Medical History: ?Past Medical History:  ?Diagnosis Date  ? Aortic regurgitation 03/2017  ? 27 mm Medtronic freestyle porcine root with direct reimplantation of the left and right coronary ostia, replacement of ascending aorta with a 28 mm Gelweave Dacron graft - [facility]  ? 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 CAD (coronary artery disease)   ? Nonobstructive at cardiac catheterization July 2018 [facility]  ? Carpal tunnel syndrome   ? Essential hypertension   ? Hepatitis C   ? Sleep apnea   ? ? ?Encounter Details: ? CNP Questionnaire - 01/18/22 1800   ? ?  ? Questionnaire  ? Do you give verbal consent to treat you today? Yes   ? Location Patient Served  Home of [address], [place]   ? Visit Setting Church or Organization   ? Patient Status Homeless   ? Insurance Medicaid;Medicare   ? Insurance Referral N/A   ? Medication N/A   ? Medical Provider No   ? Screening Referrals N/A   ? Medical Referral N/A   ? Medical Appointment Made N/A   ? Food Have Food Insecurities   ? Transportation Need transportation assistance   ? Housing/Utilities No permanent housing   ? Interpersonal Safety N/A   ? Intervention Blood pressure   ? ED Visit Averted N/A   ? Life-Saving Intervention Made N/A   ? ?  ?  ? ?  ?. aPulse 28 Stated he was seen in ER earlier and was told  to take his medications as prescribed and follow a heart  healthy ?Diet. Asked if he had taken his medications this evening and he replied no. Again reviewed the importance of taking his medications on time, reviewed the signs and symptoms of a stroke and importance of getting exercise ?Jenene Slicker RN ? ? ? ?

## 2022-02-09 ENCOUNTER — Encounter: Payer: Self-pay | Admitting: Internal Medicine

## 2022-02-24 ENCOUNTER — Encounter: Payer: Self-pay | Admitting: Internal Medicine

## 2022-02-24 ENCOUNTER — Ambulatory Visit: Payer: Medicaid Other | Admitting: Internal Medicine

## 2022-02-24 ENCOUNTER — Other Ambulatory Visit (HOSPITAL_COMMUNITY)
Admission: RE | Admit: 2022-02-24 | Discharge: 2022-02-24 | Disposition: A | Discharge status: Home / Self Care | Payer: Medicaid Other | Source: Ambulatory Visit | Attending: Internal Medicine | Admitting: Internal Medicine

## 2022-02-24 VITALS — BP 114/72 | HR 76 | Temp 97.5°F | Ht 66.0 in | Wt 181.8 lb

## 2022-02-24 DIAGNOSIS — K219 Gastro-esophageal reflux disease without esophagitis: Secondary | ICD-10-CM | POA: Diagnosis not present

## 2022-02-24 DIAGNOSIS — R768 Other specified abnormal immunological findings in serum: Secondary | ICD-10-CM | POA: Insufficient documentation

## 2022-02-24 DIAGNOSIS — Z1211 Encounter for screening for malignant neoplasm of colon: Secondary | ICD-10-CM

## 2022-02-24 DIAGNOSIS — Z0001 Encounter for general adult medical examination with abnormal findings: Secondary | ICD-10-CM | POA: Diagnosis not present

## 2022-02-24 DIAGNOSIS — E118 Type 2 diabetes mellitus with unspecified complications: Secondary | ICD-10-CM | POA: Diagnosis not present

## 2022-02-24 DIAGNOSIS — I10 Essential (primary) hypertension: Secondary | ICD-10-CM | POA: Diagnosis not present

## 2022-02-24 DIAGNOSIS — R0609 Other forms of dyspnea: Secondary | ICD-10-CM | POA: Diagnosis not present

## 2022-02-24 DIAGNOSIS — M109 Gout, unspecified: Secondary | ICD-10-CM | POA: Diagnosis not present

## 2022-02-24 DIAGNOSIS — E559 Vitamin D deficiency, unspecified: Secondary | ICD-10-CM | POA: Insufficient documentation

## 2022-02-24 DIAGNOSIS — Z1159 Encounter for screening for other viral diseases: Secondary | ICD-10-CM | POA: Insufficient documentation

## 2022-02-24 DIAGNOSIS — Z952 Presence of prosthetic heart valve: Secondary | ICD-10-CM

## 2022-02-24 LAB — HEPATIC FUNCTION PANEL
ALT: 58 U/L — ABNORMAL HIGH (ref 0–44)
AST: 40 U/L (ref 15–41)
Albumin: 3.6 g/dL (ref 3.5–5.0)
Alkaline Phosphatase: 76 U/L (ref 38–126)
Bilirubin, Direct: 0.1 mg/dL (ref 0.0–0.2)
Indirect Bilirubin: 0.6 mg/dL (ref 0.3–0.9)
Total Bilirubin: 0.7 mg/dL (ref 0.3–1.2)
Total Protein: 7.6 g/dL (ref 6.5–8.1)

## 2022-02-24 LAB — HIV ANTIBODY (ROUTINE TESTING W REFLEX): HIV Screen 4th Generation wRfx: NONREACTIVE

## 2022-02-24 LAB — HEPATITIS B SURFACE ANTIBODY,QUALITATIVE: Hep B S Ab: NONREACTIVE

## 2022-02-24 LAB — HEPATITIS B CORE ANTIBODY, TOTAL: Hep B Core Total Ab: NONREACTIVE

## 2022-02-24 LAB — HEPATITIS B SURFACE ANTIGEN: Hepatitis B Surface Ag: NONREACTIVE

## 2022-02-24 NOTE — Patient Instructions (Signed)
I am going to order blood work at [facility] to further evaluate your hepatitis C positive antibody, including hepatitis B testing, hepatitis C genotype and viral load, HIV testing, liver function test.  If the results do show active chronic hepatitis C, you will then need to have an ultrasound done at [facility].  Once I have this back we can start the process of obtaining treatment.  I am going to refer you to cardiology today given your exertional dyspnea and history of aortic valve replacement.  I would recommend colonoscopy for colon cancer screening purposes though we will hold off for now until you see cardiology.  It was very nice meeting you today.  Dr. Marletta Lor  At [facility] we value your feedback. You may receive a survey about your visit today. Please share your experience as we strive to create trusting relationships with our patients to provide genuine, compassionate, quality care.  We appreciate your understanding and patience as we review any laboratory studies, imaging, and other diagnostic tests that are ordered as we care for you. Our office policy is 5 business days for review of these results, and any emergent or urgent results are addressed in a timely manner for your best interest. If you do not hear from our office in 1 week, please contact us.   We also encourage the use of MyChart, which contains your medical information for your review as well. If you are not enrolled in this feature, an access code is on this after visit summary for your convenience. Thank you for allowing [place] to be involved in your care.  It was great to see you today!  I hope you have a great rest of your Spring!    Hennie Duos. Marletta Lor, D.O. Gastroenterology and Hepatology [facility]

## 2022-02-24 NOTE — Progress Notes (Signed)
Primary Care Physician:  Benetta Spar, MD Primary Gastroenterologist:  Dr. Marletta Lor  Chief Complaint  Patient presents with   New Patient (Initial Visit)    New pt referred for Hep C. For over 20 years    HPI:   Kirk Oconnor is a 66 y.o. male who presents to clinic today by referral from his PCP Dr. Felecia Shelling for evaluation.  Patient states he has had chronic hepatitis C for many years.  Denies any history of IV drug use.  Does note intranasal cocaine use in the past.  Was recently discharged from prison.  Does not believe he has ever been treated for his hepatitis C though he is unsure.  Recent blood work did show positive hepatitis C antibody.  No recent imaging of his liver.  Denies any previous colonoscopy.  No family history of colorectal malignancy.  No melena hematochezia.  No abdominal pain.  No unintentional weight loss.  Does have chronic GERD which is well controlled on omeprazole 40 mg daily.  Denies any dysphagia odynophagia, epigastric or chest pain.  History of aortic valve replacement.  States he gets tired very easily as well as exertional dyspnea.  Last echocardiogram that I can see was October 2018 with LVEF of 40% as well as diffuse hypokinesis.  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 30 tablet 0   aspirin EC 81 MG EC tablet Take 1 tablet (81 mg total) by mouth daily. 30 tablet 0   carvedilol (COREG) 25 MG tablet Take 1 tablet (25 mg total) by mouth 2 (two) times daily with a meal. 60 tablet 0   furosemide (LASIX) 20 MG tablet Take 20 mg by mouth.     hydrocortisone cream 1 % Apply to affected area 2 times daily 30 g 0   lisinopril (PRINIVIL,ZESTRIL) 10 MG tablet Take 1 tablet (10 mg total) by mouth daily. 90 tablet 3   meclizine (ANTIVERT) 25 MG tablet Take 25 mg by mouth 3 (three) times daily as needed for dizziness.     metFORMIN (GLUCOPHAGE) 500 MG tablet Take by mouth 2 (two) times daily with a meal.     methocarbamol (ROBAXIN) 500 MG tablet Take 1 tablet (500 mg total) by mouth every 8 (eight) hours as needed for muscle spasms. 30 tablet 0   omeprazole (PRILOSEC) 40 MG capsule Take 40 mg by mouth daily.     albuterol (PROVENTIL HFA;VENTOLIN HFA) 108 (90 Base) MCG/ACT inhaler Inhale 2 puffs into the lungs every 6 (six) hours as needed for wheezing or shortness of breath. (Patient not taking: Reported on 03/04/2019) 1 Inhaler 2   cephALEXin (KEFLEX) 500 MG capsule Take 1 capsule (500 mg total) by mouth 2 (two) times daily. (Patient not  taking: Reported on 03/19/2019) 14 capsule 0   gabapentin (NEURONTIN) 300 MG capsule Take 1 capsule (300 mg total) by mouth 3 (three) times daily. (Patient not taking: Reported on 03/04/2019) 90 capsule 3   hydrOXYzine (ATARAX/VISTARIL) 50 MG tablet Take 50 mg by mouth 2 (two) times daily. (Patient not taking: Reported on 02/24/2022)     pantoprazole (PROTONIX) 40 MG tablet Take 1 tablet (40 mg total) by mouth daily. (Patient not taking: Reported on 03/04/2019) 30 tablet 2   potassium chloride (K-DUR) 10 MEQ tablet Take 1 tablet (10 mEq total) by mouth daily. (Patient not taking: Reported on 03/04/2019) 30 tablet 0   triamcinolone cream (KENALOG) 0.1 % Apply 1 application topically 2 (two) times daily. (Patient not taking: Reported on 03/04/2019) 60 g  1   No current facility-administered medications for this visit.    Allergies as of 02/24/2022   (No Known Allergies)    Family History  Problem Relation Age of Onset   Diabetes Mother    Heart attack Mother    Heart attack Father     Social History   Socioeconomic History   Marital status: Divorced    Spouse name: Not on file   Number of children: Not on file   Years of education: Not on file   Highest education level: Not on file  Occupational History   Not on file  Tobacco Use   Smoking status: Former   Smokeless tobacco: Never  Vaping Use   Vaping Use: Never used  Substance and Sexual Activity   Alcohol use: No   Drug use: No   Sexual activity: Yes  Other Topics Concern   Not on file  Social History Narrative   Not on file   Social Determinants of Health   Financial Resource Strain: Not on file  Food Insecurity: Not on file  Transportation Needs: Not on file  Physical Activity: Not on file  Stress: Not on file  Social Connections: Not on file  Intimate Partner Violence: Not on file    Subjective: Review of Systems  Constitutional:  Negative for chills and fever.  HENT:  Negative for congestion and hearing loss.   Eyes:  Negative for blurred vision and double vision.  Respiratory:  Negative for cough and shortness of breath.   Cardiovascular:  Negative for chest pain and palpitations.  Gastrointestinal:  Negative for abdominal pain, blood in stool, constipation, diarrhea, heartburn, melena and vomiting.  Genitourinary:  Negative for dysuria and urgency.  Musculoskeletal:  Negative for joint pain and myalgias.  Skin:  Negative for itching and rash.  Neurological:  Negative for dizziness and headaches.  Psychiatric/Behavioral:  Negative for depression. The patient is not nervous/anxious.       Objective: BP 114/72   Pulse 76   Temp (!) 97.5 F (36.4 C)   Ht 5\' 6"  (1.676 m)   Wt 181 lb 12.8 oz (82.5 kg)   BMI 29.34 kg/m  Physical  Exam Constitutional:      Appearance: Normal appearance.  HENT:     Head: Normocephalic and atraumatic.  Eyes:     Extraocular Movements: Extraocular movements intact.     Conjunctiva/sclera: Conjunctivae normal.  Cardiovascular:     Rate and Rhythm: Normal rate and regular rhythm.     Comments: Murmur followed by click Pulmonary:     Effort: Pulmonary effort is normal.     Breath sounds: Normal breath sounds.  Abdominal:     General: Bowel sounds are normal.  Palpations: Abdomen is soft.  Musculoskeletal:        General: Normal range of motion.     Cervical back: Normal range of motion and neck supple.  Skin:    General: Skin is warm.  Neurological:     General: No focal deficit present.     Mental Status: He is alert and oriented to person, place, and time.  Psychiatric:        Mood and Affect: Mood normal.        Behavior: Behavior normal.     Assessment: *Positive hepatitis C antibody *Colon cancer screening *Chronic GERD  Plan: Discussed hepatitis C in depth with patient today.  We will perform blood work today including hepatitis C genotype viral load, hepatitis B testing, HIV testing.  If blood work indicative of chronic hepatitis C, we will proceed with ultrasound with elastography to further evaluate in anticipation of treatment.  Discussed treatment options in depth with patient today.  Patient needs colonoscopy for colon cancer screening.  I am concerned about his cardiac function given worsening exertional dyspnea, history decreased LVEF of 40%, aortic valve replacement.  We will refer to cardiology for evaluation and to establish care.  Can discuss scheduling colonoscopy on follow-up visit with me in 3 months.  Thank you Dr. Felecia Shelling for the kind referral.  02/24/2022 2:21 PM   Disclaimer: This note was dictated with voice recognition software. Similar sounding words can inadvertently be transcribed and may not be corrected upon review.

## 2022-02-25 LAB — HCV RNA QUANT
HCV Quantitative Log: 5.041 log10 IU/mL (ref >1.70)
HCV Quantitative: 110000 IU/mL (ref >50)

## 2022-02-27 LAB — HEPATITIS C GENOTYPE

## 2022-03-02 ENCOUNTER — Telehealth: Payer: Self-pay | Admitting: Internal Medicine

## 2022-03-02 NOTE — Telephone Encounter (Signed)
Noted.  See result note.  

## 2022-03-02 NOTE — Telephone Encounter (Signed)
Pt called to give [place] permission to speak with his sister, Shaune Pascal, and to call her at [phone]. Pt was told the next time he comes to the office he will need to sign a release of medical information form so we will have it on file. He did give [place] verbal consent for today.

## 2022-03-04 ENCOUNTER — Other Ambulatory Visit: Payer: Self-pay | Admitting: *Deleted

## 2022-03-04 DIAGNOSIS — R768 Other specified abnormal immunological findings in serum: Secondary | ICD-10-CM

## 2022-03-11 ENCOUNTER — Ambulatory Visit (HOSPITAL_COMMUNITY)
Admission: RE | Admit: 2022-03-11 | Discharge: 2022-03-11 | Disposition: A | Discharge status: Home / Self Care | Payer: Medicaid Other | Source: Ambulatory Visit | Attending: Internal Medicine | Admitting: Internal Medicine

## 2022-03-11 DIAGNOSIS — R768 Other specified abnormal immunological findings in serum: Secondary | ICD-10-CM | POA: Diagnosis present

## 2022-04-18 ENCOUNTER — Encounter: Payer: Self-pay | Admitting: Cardiology

## 2022-04-18 ENCOUNTER — Ambulatory Visit (INDEPENDENT_AMBULATORY_CARE_PROVIDER_SITE_OTHER): Payer: Medicaid Other | Admitting: Cardiology

## 2022-04-18 ENCOUNTER — Ambulatory Visit (INDEPENDENT_AMBULATORY_CARE_PROVIDER_SITE_OTHER): Payer: Medicaid Other

## 2022-04-18 VITALS — BP 110/70 | HR 70 | Ht 67.0 in | Wt 182.2 lb

## 2022-04-18 DIAGNOSIS — I10 Essential (primary) hypertension: Secondary | ICD-10-CM | POA: Diagnosis not present

## 2022-04-18 DIAGNOSIS — R0609 Other forms of dyspnea: Secondary | ICD-10-CM

## 2022-04-18 DIAGNOSIS — R0789 Other chest pain: Secondary | ICD-10-CM | POA: Diagnosis not present

## 2022-04-18 LAB — ECHOCARDIOGRAM COMPLETE
AR max vel: 2.04 cm2
AV Area VTI: 2.16 cm2
AV Area mean vel: 2.18 cm2
AV Mean grad: 4.3 mmHg
AV Peak grad: 9.9 mmHg
Ao pk vel: 1.57 m/s
Area-P 1/2: 3.23 cm2
Calc EF: 65.2 %
Height: 67 in
P 1/2 time: 2309 msec
S' Lateral: 2.45 cm
Single Plane A2C EF: 65.3 %
Single Plane A4C EF: 63 %
Weight: 2915.2 oz

## 2022-04-18 NOTE — Progress Notes (Signed)
Clinical Summary Kirk Oconnor is a 66 y.o.male seen as a new patient today. Last seen by Dr Purvis Sheffield in 2018   1.Chest pain 2018 cath [facility]: mild nonobstructive CAD  infrequent chest pains not related to SOB. Just few seconds of stabbing pain right sided  2.Aortic aneurysm/Aortic regurgitation - severe aortic regurgitation with ascending aortic aneurysm status post bioprosthetic AVR and aortic root replacement with reimplantation of coronaries in July at [facility] -2018 echo normal AVR    3. DOE - referred by GI, needs colonscopy - 2018 echo LVEF 40%, grade I dd, normal AVR  - SOB comes and goes. DOE at 2 blocks which is new for him, gradually worsening over the last 3 years - no recent edema - infrequent chest pains not related to SOB. 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 (Patient not taking: Reported on 03/04/2019) 1 Inhaler 2   amLODipine (NORVASC) 10 MG tablet Take 1 tablet (10 mg total) by mouth daily. 30 tablet 0   aspirin EC 81 MG EC tablet Take 1 tablet (81 mg total) by mouth daily. 30 tablet 0   carvedilol (COREG) 25 MG tablet Take 1 tablet (25 mg total) by mouth 2 (two) times daily with a meal. 60 tablet 0    cephALEXin (KEFLEX) 500 MG capsule Take 1 capsule (500 mg total) by mouth 2 (two) times daily. (Patient not taking: Reported on 03/19/2019) 14 capsule 0   furosemide (LASIX) 20 MG tablet Take 20 mg by mouth.     gabapentin (NEURONTIN) 300 MG capsule Take 1 capsule (300 mg total) by mouth 3 (three) times daily. (Patient not taking: Reported on 03/04/2019) 90 capsule 3   hydrocortisone cream 1 % Apply to affected area 2 times daily 30 g 0   hydrOXYzine (ATARAX/VISTARIL) 50 MG tablet Take 50 mg by mouth 2 (two) times daily. (Patient not taking: Reported on 02/24/2022)     lisinopril (PRINIVIL,ZESTRIL) 10 MG tablet Take 1 tablet (10 mg total) by mouth daily. 90 tablet 3   meclizine (ANTIVERT) 25 MG tablet Take 25 mg by mouth 3 (three) times daily as needed for dizziness.     metFORMIN (GLUCOPHAGE) 500 MG tablet Take by mouth 2 (two) times daily with a meal.     methocarbamol (ROBAXIN) 500 MG tablet Take 1 tablet (500 mg total) by mouth every 8 (eight) hours as needed for muscle spasms. 30 tablet 0   omeprazole (PRILOSEC) 40 MG capsule Take 40 mg by mouth daily.     potassium chloride (K-DUR) 10 MEQ tablet Take 1 tablet (10 mEq total) by mouth daily. (Patient not taking: Reported on 03/04/2019) 30 tablet 0  triamcinolone cream (KENALOG) 0.1 % Apply 1 application topically 2 (two) times daily. (Patient not taking: Reported on 03/04/2019) 60 g 1   No current facility-administered medications for this visit.     Past Surgical History:  Procedure Laterality Date   AORTIC VALVE REPLACEMENT  03/2017   [facility]     No Known Allergies    Family History  Problem Relation Age of Onset   Diabetes Mother    Heart attack Mother    Heart attack Father      Social History Mr. Millspaugh reports that he has quit smoking. He has never used smokeless tobacco. Mr. Frett reports no history of alcohol use.   Review of Systems CONSTITUTIONAL: No weight loss, fever, chills, weakness or fatigue.  HEENT: Eyes:  No visual loss, blurred vision, double vision or yellow sclerae.No hearing loss, sneezing, congestion, runny nose or sore throat.  SKIN: No rash or itching.  CARDIOVASCULAR: per hpi RESPIRATORY: No shortness of breath, cough or sputum.  GASTROINTESTINAL: No anorexia, nausea, vomiting or diarrhea. No abdominal pain or blood.  GENITOURINARY: No burning on urination, no polyuria NEUROLOGICAL: No headache, dizziness, syncope, paralysis, ataxia, numbness or tingling in the extremities. No change in bowel or bladder control.  MUSCULOSKELETAL: No muscle, back pain, joint pain or stiffness.  LYMPHATICS: No enlarged nodes. No history of splenectomy.  PSYCHIATRIC: No history of depression or anxiety.  ENDOCRINOLOGIC: No reports of sweating, cold or heat intolerance. No polyuria or polydipsia.  Marland Kitchen   Physical Examination Today's Vitals   04/18/22 1315  BP: 110/70  Pulse: 70  SpO2: 98%  Weight: 182 lb 3.2 oz (82.6 kg)  Height: 5\' 7"  (1.702 m)   Body mass index is 28.54 kg/m.  Gen: resting comfortably, no acute distress HEENT: no scleral icterus, pupils equal round and reactive, no palptable cervical adenopathy,  CV: RRR, 2/6 systolic murmur rusb, no jvd Resp: Clear to auscultation bilaterally GI: abdomen is soft, non-tender, non-distended, normal bowel sounds, no hepatosplenomegaly MSK: extremities are warm, no edema.  Skin: warm, no rash Neuro:  no focal deficits Psych: appropriate affect       Assessment and Plan  1.DOE - unclear etiology - 2018 had mild LV systolic dysfunction by echo. Repeat echo to reassess as well as reevaluate his AVR  2. Chest pain - long history atypical pain, cath 2018 was normal - no further workup at this time  3. History of AI/aortic aneurysm s/p repair - repeat 2019      [place], M.D.

## 2022-04-18 NOTE — Patient Instructions (Signed)

## 2022-05-04 ENCOUNTER — Telehealth: Payer: Self-pay | Admitting: *Deleted

## 2022-05-04 NOTE — Telephone Encounter (Signed)
-----   Message from Antoine Poche, MD sent at 05/02/2022  1:33 PM EDT ----- Echo shows normal heart pumping function, his aortic valve is working normally. Nothing to limit him from endscopy with GI. If ongoing SOB would recommend PFTs to evaluate the lungs  Dominga Ferry MD

## 2022-05-04 NOTE — Telephone Encounter (Signed)
Lesle Chris, LPN  10/02/5100 58:52 AM EDT Back to Top    Notified sister Malachi Paradise), copy to pcp.  States he is doing great now, but will call back if anything changes in regards to SOB.

## 2022-05-24 ENCOUNTER — Ambulatory Visit (INDEPENDENT_AMBULATORY_CARE_PROVIDER_SITE_OTHER): Payer: Medicaid Other | Admitting: Urology

## 2022-05-24 ENCOUNTER — Encounter: Payer: Self-pay | Admitting: Urology

## 2022-05-24 VITALS — BP 144/90 | HR 76

## 2022-05-24 DIAGNOSIS — N529 Male erectile dysfunction, unspecified: Secondary | ICD-10-CM | POA: Diagnosis not present

## 2022-05-24 MED ORDER — SILDENAFIL CITRATE 100 MG PO TABS: 100.0000 mg | ORAL_TABLET | Freq: Every day | ORAL | 2 refills | Status: DC | PRN

## 2022-05-24 NOTE — Patient Instructions (Signed)
Erectile Dysfunction ?Erectile dysfunction (ED) is the inability to get or keep an erection in order to have sexual intercourse. ED is considered a symptom of an underlying disorder and is not considered a disease. ED may include: ?Inability to get an erection. ?Lack of enough hardness of the erection to allow penetration. ?Loss of erection before sex is finished. ?What are the causes? ?This condition may be caused by: ?Physical causes, such as: ?Artery problems. This may include heart disease, high blood pressure, atherosclerosis, and diabetes. ?Hormonal problems, such as low testosterone. ?Obesity. ?Nerve problems. This may include back or pelvic injuries, multiple sclerosis, Parkinson's disease, spinal cord injury, and stroke. ?Certain medicines, such as: ?Pain relievers. ?Antidepressants. ?Blood pressure medicines and water pills (diuretics). ?Cancer medicines. ?Antihistamines. ?Muscle relaxants. ?Lifestyle factors, such as: ?Use of drugs such as marijuana, cocaine, or opioids. ?Excessive use of alcohol. ?Smoking. ?Lack of physical activity or exercise. ?Psychological causes, such as: ?Anxiety or stress. ?Sadness or depression. ?Exhaustion. ?Fear about sexual performance. ?Guilt. ?What are the signs or symptoms? ?Symptoms of this condition include: ?Inability to get an erection. ?Lack of enough hardness of the erection to allow penetration. ?Loss of the erection before sex is finished. ?Sometimes having normal erections, but with frequent unsatisfactory episodes. ?Low sexual satisfaction in either partner due to erection problems. ?A curved penis occurring with erection. The curve may cause pain, or the penis may be too curved to allow for intercourse. ?Never having nighttime or morning erections. ?How is this diagnosed? ?This condition is often diagnosed by: ?Performing a physical exam to find other diseases or specific problems with the penis. ?Asking you detailed questions about the problem. ?Doing tests,  such as: ?Blood tests to check for diabetes mellitus or high cholesterol, or to measure hormone levels. ?Other tests to check for underlying health conditions. ?An ultrasound exam to check for scarring. ?A test to check blood flow to the penis. ?Doing a sleep study at home to measure nighttime erections. ?How is this treated? ?This condition may be treated by: ?Medicines, such as: ?Medicine taken by mouth to help you achieve an erection (oral medicine). ?Hormone replacement therapy to replace low testosterone levels. ?Medicine that is injected into the penis. Your health care provider may instruct you how to give yourself these injections at home. ?Medicine that is delivered with a short applicator tube. The tube is inserted into the opening at the tip of the penis, which is the opening of the urethra. A tiny pellet of medicine is put in the urethra. The pellet dissolves and enhances erectile function. This is also called MUSE (medicated urethral system for erections) therapy. ?Vacuum pump. This is a pump with a ring on it. The pump and ring are placed on the penis and used to create pressure that helps the penis become erect. ?Penile implant surgery. In this procedure, you may receive: ?An inflatable implant. This consists of cylinders, a pump, and a reservoir. The cylinders can be inflated with a fluid that helps to create an erection, and they can be deflated after intercourse. ?A semi-rigid implant. This consists of two silicone rubber rods. The rods provide some rigidity. They are also flexible, so the penis can both curve downward in its normal position and become straight for sexual intercourse. ?Blood vessel surgery to improve blood flow to the penis. During this procedure, a blood vessel from a different part of the body is placed into the penis to allow blood to flow around (bypass) damaged or blocked blood vessels. ?Lifestyle changes,   such as exercising more, losing weight, and quitting smoking. ?Follow  these instructions at home: ?Medicines ? ?Take over-the-counter and prescription medicines only as told by your health care provider. Do not increase the dosage without first discussing it with your health care provider. ?If you are using self-injections, do injections as directed by your health care provider. Make sure you avoid any veins that are on the surface of the penis. After giving an injection, apply pressure to the injection site for 5 minutes. ?Talk to your health care provider about how to prevent headaches while taking ED medicines. These medicines may cause a sudden headache due to the increase in blood flow in your body. ?General instructions ?Exercise regularly, as directed by your health care provider. Work with your health care provider to lose weight, if needed. ?Do not use any products that contain nicotine or tobacco. These products include cigarettes, chewing tobacco, and vaping devices, such as e-cigarettes. If you need help quitting, ask your health care provider. ?Before using a vacuum pump, read the instructions that come with the pump and discuss any questions with your health care provider. ?Keep all follow-up visits. This is important. ?Contact a health care provider if: ?You feel nauseous. ?You are vomiting. ?You get sudden headaches while taking ED medicines. ?You have any concerns about your sexual health. ?Get help right away if: ?You are taking oral or injectable medicines and you have an erection that lasts longer than 4 hours. If your health care provider is unavailable, go to the nearest emergency room for evaluation. An erection that lasts much longer than 4 hours can result in permanent damage to your penis. ?You have severe pain in your groin or abdomen. ?You develop redness or severe swelling of your penis. ?You have redness spreading at your groin or lower abdomen. ?You are unable to urinate. ?You experience chest pain or a rapid heartbeat (palpitations) after taking oral  medicines. ?These symptoms may represent a serious problem that is an emergency. Do not wait to see if the symptoms will go away. Get medical help right away. Call your local emergency services (911 in the [place] Do not drive yourself to the hospital. ?Summary ?Erectile dysfunction (ED) is the inability to get or keep an erection during sexual intercourse. ?This condition is diagnosed based on a physical exam, your symptoms, and tests to determine the cause. Treatment varies depending on the cause and may include medicines, hormone therapy, surgery, or a vacuum pump. ?You may need follow-up visits to make sure that you are using your medicines or devices correctly. ?Get help right away if you are taking or injecting medicines and you have an erection that lasts longer than 4 hours. ?This information is not intended to replace advice given to you by your health care provider. Make sure you discuss any questions you have with your health care provider. ?Document Revised: 12/09/2020 Document Reviewed: 12/09/2020 ?Elsevier Patient Education ? 2023 Elsevier Inc. ? ?

## 2022-05-24 NOTE — Progress Notes (Signed)
05/24/2022 10:38 AM   Bethann Berkshire Marlou Starks 11/21/1955 [id number]  Referring provider: Benetta Spar, MD [address] [place],  [place] [address]  Erectile dysfunction   HPI: [name] is a 66yo here for evaluation of erectile dysfunction. He has had issues both getting and maintaining an erection. He was previously treated with cialis 20mg  which gave him a firm erection 2 days later. He has tried sildenafil in the past with good results.  Good libido. He is on metformin for DMII. Last A1c was 7.1. IPSS 1 QOL 0.    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 If you have any questions, ask your nurse or doctor.          amLODipine 5 MG tablet Commonly known as: NORVASC Take 5 mg by mouth daily.   aspirin EC 81 MG tablet Take 1 tablet (81 mg total) by mouth daily.   cholecalciferol 25 MCG (1000 UNIT) tablet Commonly known as: VITAMIN D3 Take 3,000 Units by mouth daily.   furosemide 40 MG tablet Commonly known as: LASIX Take 40 mg by mouth 2 (two) times daily.   gabapentin 300 MG capsule Commonly known as: NEURONTIN Take 1 capsule (300 mg total) by mouth 3 (three) times daily.   hydrocortisone cream 1 % Apply to affected area 2 times daily   hydrOXYzine 50  MG tablet Commonly known as: ATARAX Take 50 mg by mouth 2 (two) times daily.   ibuprofen 800 MG tablet Commonly known as: ADVIL Take 800 mg by mouth every 8 (eight) hours as needed.   lisinopril 20 MG tablet Commonly known as: ZESTRIL Take 20 mg by mouth every morning.   meclizine 25 MG tablet Commonly known as: ANTIVERT Take 25 mg by mouth 3 (three) times daily as needed for dizziness.   metFORMIN 500 MG tablet Commonly known as: GLUCOPHAGE Take by mouth 2 (two) times daily with a meal.   methocarbamol 500 MG tablet Commonly known as: ROBAXIN Take 1 tablet (500 mg total) by mouth every 8 (eight) hours as needed for muscle spasms.   omeprazole 40 MG capsule Commonly known as: PRILOSEC Take 40 mg by mouth daily.   potassium chloride 10 MEQ tablet Commonly known as: KLOR-CON Take 1 tablet (10 mEq total) by mouth daily.   triamcinolone cream 0.1 % Commonly known as: KENALOG Apply 1 application topically 2 (two) times daily.        Allergies: No Known Allergies  Family History: Family History  Problem Relation Age of Onset   Diabetes Mother    Heart attack Mother    Heart attack Father     Social History:  reports that he  has quit smoking. He has never used smokeless tobacco. He reports that he does not drink alcohol and does not use drugs.  ROS: All other review of systems were reviewed and are negative except what is noted above in HPI  Physical Exam: BP (!) 144/90   Pulse 76   Constitutional:  Alert and oriented, No acute distress. HEENT: [place] AT, moist mucus membranes.  Trachea midline, no masses. Cardiovascular: No clubbing, cyanosis, or edema. Respiratory: Normal respiratory effort, no increased work of breathing. GI: Abdomen is soft, nontender, nondistended, no abdominal masses GU: No CVA tenderness.  Lymph: No cervical or inguinal lymphadenopathy. Skin: No rashes, bruises or suspicious lesions. Neurologic: Grossly intact, no focal deficits, moving  all 4 extremities. Psychiatric: Normal mood and affect.  Laboratory Data: Lab Results  Component Value Date   WBC 7.2 01/27/2022   HGB 14.2 01/27/2022   HCT 43.6 01/27/2022   MCV 91.0 01/27/2022   PLT 187 01/27/2022    Lab Results  Component Value Date   CREATININE 0.98 01/27/2022    Lab Results  Component Value Date   PSA 1.68 03/11/2013    Lab Results  Component Value Date   TESTOSTERONE 691 03/11/2013    Lab Results  Component Value Date   HGBA1C 7.1 (H) 01/27/2022    Urinalysis    Component Value Date/Time   COLORURINE YELLOW 07/12/2017 1604   APPEARANCEUR CLEAR 07/12/2017 1604   LABSPEC 1.011 07/12/2017 1604   PHURINE 6.0 07/12/2017 1604   GLUCOSEU NEGATIVE 07/12/2017 1604   HGBUR NEGATIVE 07/12/2017 1604   BILIRUBINUR NEGATIVE 07/12/2017 1604   KETONESUR NEGATIVE 07/12/2017 1604   PROTEINUR NEGATIVE 07/12/2017 1604   UROBILINOGEN 1.0 11/30/2011 1910   NITRITE NEGATIVE 07/12/2017 1604   LEUKOCYTESUR NEGATIVE 07/12/2017 1604    No results found for: "LABMICR", "WBCUA", "RBCUA", "LABEPIT", "MUCUS", "BACTERIA"  Pertinent Imaging:  No results found for this or any previous visit.  No results found for this or any previous visit.  No results found for this or any previous visit.  No results found for this or any previous visit.  No results found for this or any previous visit.  No results found for this or any previous visit.  No results found for this or any previous visit.  No results found for this or any previous visit.   Assessment & Plan:    1. Erectile dysfunction, unspecified erectile dysfunction type -we will trial sildenafil 100mg    No follow-ups on file.  , MD  [facility] [place]

## 2022-06-09 ENCOUNTER — Ambulatory Visit: Payer: Medicaid Other | Admitting: Internal Medicine

## 2022-07-06 ENCOUNTER — Ambulatory Visit (INDEPENDENT_AMBULATORY_CARE_PROVIDER_SITE_OTHER): Payer: Medicaid Other | Admitting: Urology

## 2022-07-06 ENCOUNTER — Encounter: Payer: Self-pay | Admitting: Urology

## 2022-07-06 VITALS — BP 143/92 | HR 74

## 2022-07-06 DIAGNOSIS — N529 Male erectile dysfunction, unspecified: Secondary | ICD-10-CM

## 2022-07-06 MED ORDER — AMBULATORY NON FORMULARY MEDICATION
0.2000 mL | 5 refills | Status: DC | PRN
Start: 2022-07-06 — End: 2022-07-20

## 2022-07-06 MED ORDER — SILDENAFIL CITRATE 50 MG PO TABS: 50.0000 mg | ORAL_TABLET | Freq: Every day | ORAL | 3 refills | Status: DC | PRN

## 2022-07-06 NOTE — Patient Instructions (Signed)
Erectile Dysfunction ?Erectile dysfunction (ED) is the inability to get or keep an erection in order to have sexual intercourse. ED is considered a symptom of an underlying disorder and is not considered a disease. ED may include: ?Inability to get an erection. ?Lack of enough hardness of the erection to allow penetration. ?Loss of erection before sex is finished. ?What are the causes? ?This condition may be caused by: ?Physical causes, such as: ?Artery problems. This may include heart disease, high blood pressure, atherosclerosis, and diabetes. ?Hormonal problems, such as low testosterone. ?Obesity. ?Nerve problems. This may include back or pelvic injuries, multiple sclerosis, Parkinson's disease, spinal cord injury, and stroke. ?Certain medicines, such as: ?Pain relievers. ?Antidepressants. ?Blood pressure medicines and water pills (diuretics). ?Cancer medicines. ?Antihistamines. ?Muscle relaxants. ?Lifestyle factors, such as: ?Use of drugs such as marijuana, cocaine, or opioids. ?Excessive use of alcohol. ?Smoking. ?Lack of physical activity or exercise. ?Psychological causes, such as: ?Anxiety or stress. ?Sadness or depression. ?Exhaustion. ?Fear about sexual performance. ?Guilt. ?What are the signs or symptoms? ?Symptoms of this condition include: ?Inability to get an erection. ?Lack of enough hardness of the erection to allow penetration. ?Loss of the erection before sex is finished. ?Sometimes having normal erections, but with frequent unsatisfactory episodes. ?Low sexual satisfaction in either partner due to erection problems. ?A curved penis occurring with erection. The curve may cause pain, or the penis may be too curved to allow for intercourse. ?Never having nighttime or morning erections. ?How is this diagnosed? ?This condition is often diagnosed by: ?Performing a physical exam to find other diseases or specific problems with the penis. ?Asking you detailed questions about the problem. ?Doing tests,  such as: ?Blood tests to check for diabetes mellitus or high cholesterol, or to measure hormone levels. ?Other tests to check for underlying health conditions. ?An ultrasound exam to check for scarring. ?A test to check blood flow to the penis. ?Doing a sleep study at home to measure nighttime erections. ?How is this treated? ?This condition may be treated by: ?Medicines, such as: ?Medicine taken by mouth to help you achieve an erection (oral medicine). ?Hormone replacement therapy to replace low testosterone levels. ?Medicine that is injected into the penis. Your health care provider may instruct you how to give yourself these injections at home. ?Medicine that is delivered with a short applicator tube. The tube is inserted into the opening at the tip of the penis, which is the opening of the urethra. A tiny pellet of medicine is put in the urethra. The pellet dissolves and enhances erectile function. This is also called MUSE (medicated urethral system for erections) therapy. ?Vacuum pump. This is a pump with a ring on it. The pump and ring are placed on the penis and used to create pressure that helps the penis become erect. ?Penile implant surgery. In this procedure, you may receive: ?An inflatable implant. This consists of cylinders, a pump, and a reservoir. The cylinders can be inflated with a fluid that helps to create an erection, and they can be deflated after intercourse. ?A semi-rigid implant. This consists of two silicone rubber rods. The rods provide some rigidity. They are also flexible, so the penis can both curve downward in its normal position and become straight for sexual intercourse. ?Blood vessel surgery to improve blood flow to the penis. During this procedure, a blood vessel from a different part of the body is placed into the penis to allow blood to flow around (bypass) damaged or blocked blood vessels. ?Lifestyle changes,   such as exercising more, losing weight, and quitting smoking. ?Follow  these instructions at home: ?Medicines ? ?Take over-the-counter and prescription medicines only as told by your health care provider. Do not increase the dosage without first discussing it with your health care provider. ?If you are using self-injections, do injections as directed by your health care provider. Make sure you avoid any veins that are on the surface of the penis. After giving an injection, apply pressure to the injection site for 5 minutes. ?Talk to your health care provider about how to prevent headaches while taking ED medicines. These medicines may cause a sudden headache due to the increase in blood flow in your body. ?General instructions ?Exercise regularly, as directed by your health care provider. Work with your health care provider to lose weight, if needed. ?Do not use any products that contain nicotine or tobacco. These products include cigarettes, chewing tobacco, and vaping devices, such as e-cigarettes. If you need help quitting, ask your health care provider. ?Before using a vacuum pump, read the instructions that come with the pump and discuss any questions with your health care provider. ?Keep all follow-up visits. This is important. ?Contact a health care provider if: ?You feel nauseous. ?You are vomiting. ?You get sudden headaches while taking ED medicines. ?You have any concerns about your sexual health. ?Get help right away if: ?You are taking oral or injectable medicines and you have an erection that lasts longer than 4 hours. If your health care provider is unavailable, go to the nearest emergency room for evaluation. An erection that lasts much longer than 4 hours can result in permanent damage to your penis. ?You have severe pain in your groin or abdomen. ?You develop redness or severe swelling of your penis. ?You have redness spreading at your groin or lower abdomen. ?You are unable to urinate. ?You experience chest pain or a rapid heartbeat (palpitations) after taking oral  medicines. ?These symptoms may represent a serious problem that is an emergency. Do not wait to see if the symptoms will go away. Get medical help right away. Call your local emergency services (911 in the [place] Do not drive yourself to the hospital. ?Summary ?Erectile dysfunction (ED) is the inability to get or keep an erection during sexual intercourse. ?This condition is diagnosed based on a physical exam, your symptoms, and tests to determine the cause. Treatment varies depending on the cause and may include medicines, hormone therapy, surgery, or a vacuum pump. ?You may need follow-up visits to make sure that you are using your medicines or devices correctly. ?Get help right away if you are taking or injecting medicines and you have an erection that lasts longer than 4 hours. ?This information is not intended to replace advice given to you by your health care provider. Make sure you discuss any questions you have with your health care provider. ?Document Revised: 12/09/2020 Document Reviewed: 12/09/2020 ?Elsevier Patient Education ? 2023 Elsevier Inc. ? ?

## 2022-07-06 NOTE — Progress Notes (Signed)
07/06/2022 9:48 AM   Bethann Berkshire Marlou Starks 08/06/1956 [id number]  Referring provider: Benetta Spar, MD [address] [place],  [place] [address]  Followup erectile dysfunction   HPI: Mr Meiring is a 66yo here for followup for erectile dysfunction. He tried sildenafil 100mg  which failed to give him a firm erection. Cialis caused him to have an erection 1-2 day later. He has tried sildenafil 200mg  once which also failed to give him a firm erection. 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 If you have any questions, ask your nurse or doctor.          amLODipine 5 MG tablet Commonly known as: NORVASC Take 5 mg by mouth daily.   aspirin EC 81 MG tablet Take 1 tablet (81 mg total) by mouth daily.   cholecalciferol 25 MCG (1000 UNIT) tablet Commonly known as: VITAMIN D3 Take 3,000 Units by mouth daily.   furosemide 40 MG tablet Commonly known as: LASIX Take 40 mg by mouth 2 (two) times daily.   gabapentin 300 MG capsule Commonly known as: NEURONTIN Take 1 capsule (300 mg total) by mouth 3 (three) times daily.   hydrocortisone cream 1 % Apply to affected area 2 times daily   hydrOXYzine 50 MG tablet Commonly known  as: ATARAX Take 50 mg by mouth 2 (two) times daily.   ibuprofen 800 MG tablet Commonly known as: ADVIL Take 800 mg by mouth every 8 (eight) hours as needed.   lisinopril 20 MG tablet Commonly known as: ZESTRIL Take 20 mg by mouth every morning.   meclizine 25 MG tablet Commonly known as: ANTIVERT Take 25 mg by mouth 3 (three) times daily as needed for dizziness.   metFORMIN 500 MG tablet Commonly known as: GLUCOPHAGE Take by mouth 2 (two) times daily with a meal.   methocarbamol 500 MG tablet Commonly known as: ROBAXIN Take 1 tablet (500 mg total) by mouth every 8 (eight) hours as needed for muscle spasms.   omeprazole 40 MG capsule Commonly known as: PRILOSEC Take 40 mg by mouth daily.   potassium chloride 10 MEQ tablet Commonly known as: KLOR-CON Take 1 tablet (10 mEq total) by mouth daily.   sildenafil 100 MG tablet Commonly known as: VIAGRA Take 1 tablet (100 mg total) by mouth daily as needed for erectile dysfunction.   triamcinolone cream 0.1 % Commonly known as: KENALOG Apply 1 application topically 2 (two) times daily.        Allergies: No Known Allergies  Family History: Family History  Problem Relation Age of Onset   Diabetes Mother    Heart attack Mother  Heart attack Father     Social History:  reports that he has quit smoking. He has never used smokeless tobacco. He reports that he does not drink alcohol and does not use drugs.  ROS: All other review of systems were reviewed and are negative except what is noted above in HPI  Physical Exam: BP (!) 143/92   Pulse 74   Constitutional:  Alert and oriented, No acute distress. HEENT: [place] AT, moist mucus membranes.  Trachea midline, no masses. Cardiovascular: No clubbing, cyanosis, or edema. Respiratory: Normal respiratory effort, no increased work of breathing. GI: Abdomen is soft, nontender, nondistended, no abdominal masses GU: No CVA tenderness.  Lymph: No cervical or inguinal  lymphadenopathy. Skin: No rashes, bruises or suspicious lesions. Neurologic: Grossly intact, no focal deficits, moving all 4 extremities. Psychiatric: Normal mood and affect.  Laboratory Data: Lab Results  Component Value Date   WBC 7.2 01/27/2022   HGB 14.2 01/27/2022   HCT 43.6 01/27/2022   MCV 91.0 01/27/2022   PLT 187 01/27/2022    Lab Results  Component Value Date   CREATININE 0.98 01/27/2022    Lab Results  Component Value Date   PSA 1.68 03/11/2013    Lab Results  Component Value Date   TESTOSTERONE 691 03/11/2013    Lab Results  Component Value Date   HGBA1C 7.1 (H) 01/27/2022    Urinalysis    Component Value Date/Time   COLORURINE YELLOW 07/12/2017 1604   APPEARANCEUR CLEAR 07/12/2017 1604   LABSPEC 1.011 07/12/2017 1604   PHURINE 6.0 07/12/2017 1604   GLUCOSEU NEGATIVE 07/12/2017 1604   HGBUR NEGATIVE 07/12/2017 [place] 07/12/2017 [place] 07/12/2017 1604   PROTEINUR NEGATIVE 07/12/2017 1604   UROBILINOGEN 1.0 11/30/2011 1910   NITRITE NEGATIVE 07/12/2017 1604   LEUKOCYTESUR NEGATIVE 07/12/2017 1604    No results found for: "LABMICR", "WBCUA", "RBCUA", "LABEPIT", "MUCUS", "BACTERIA"  Pertinent Imaging:  No results found for this or any previous visit.  No results found for this or any previous visit.  No results found for this or any previous visit.  No results found for this or any previous visit.  No results found for this or any previous visit.  No valid procedures specified. No results found for this or any previous visit.  No results found for this or any previous visit.   Assessment & Plan:    1. Erectile dysfunction, unspecified erectile dysfunction type We will trial sildenafil 50mg  RTC 4-6 weeks  No follow-ups on file.  Nicolette Bang, MD  [facility] [place]

## 2022-07-20 ENCOUNTER — Encounter: Payer: Self-pay | Admitting: Gastroenterology

## 2022-07-20 ENCOUNTER — Ambulatory Visit (INDEPENDENT_AMBULATORY_CARE_PROVIDER_SITE_OTHER): Payer: Medicaid Other | Admitting: Gastroenterology

## 2022-07-20 ENCOUNTER — Other Ambulatory Visit (HOSPITAL_COMMUNITY)
Admission: RE | Admit: 2022-07-20 | Discharge: 2022-07-20 | Disposition: A | Discharge status: Home / Self Care | Payer: Medicaid Other | Source: Ambulatory Visit | Attending: Gastroenterology | Admitting: Gastroenterology

## 2022-07-20 VITALS — BP 182/103 | HR 76 | Temp 97.3°F | Ht 67.0 in | Wt 175.2 lb

## 2022-07-20 DIAGNOSIS — K219 Gastro-esophageal reflux disease without esophagitis: Secondary | ICD-10-CM | POA: Diagnosis not present

## 2022-07-20 DIAGNOSIS — B1920 Unspecified viral hepatitis C without hepatic coma: Secondary | ICD-10-CM | POA: Diagnosis present

## 2022-07-20 DIAGNOSIS — K7460 Unspecified cirrhosis of liver: Secondary | ICD-10-CM

## 2022-07-20 LAB — COMPREHENSIVE METABOLIC PANEL
ALT: 47 U/L — ABNORMAL HIGH (ref 0–44)
AST: 37 U/L (ref 15–41)
Albumin: 3.5 g/dL (ref 3.5–5.0)
Alkaline Phosphatase: 80 U/L (ref 38–126)
Anion gap: 7 (ref 5–15)
BUN: 14 mg/dL (ref 8–23)
CO2: 24 mmol/L (ref 22–32)
Calcium: 9.4 mg/dL (ref 8.9–10.3)
Chloride: 108 mmol/L (ref 98–111)
Creatinine, Ser: 0.97 mg/dL (ref 0.61–1.24)
GFR, Estimated: >60 mL/min (ref >60)
Glucose, Bld: 109 mg/dL — ABNORMAL HIGH (ref 70–99)
Potassium: 3.8 mmol/L (ref 3.5–5.1)
Sodium: 139 mmol/L (ref 135–145)
Total Bilirubin: 0.6 mg/dL (ref 0.3–1.2)
Total Protein: 7.4 g/dL (ref 6.5–8.1)

## 2022-07-20 LAB — CBC WITH DIFFERENTIAL/PLATELET
Abs Immature Granulocytes: 0.01 10*3/uL (ref 0.00–0.07)
Basophils Absolute: 0 10*3/uL (ref 0.0–0.1)
Basophils Relative: 1 %
Eosinophils Absolute: 0.2 10*3/uL (ref 0.0–0.5)
Eosinophils Relative: 3 %
HCT: 43 % (ref 39.0–52.0)
Hemoglobin: 14.5 g/dL (ref 13.0–17.0)
Immature Granulocytes: 0 %
Lymphocytes Relative: 43 %
Lymphs Abs: 2.5 10*3/uL (ref 0.7–4.0)
MCH: 30.4 pg (ref 26.0–34.0)
MCHC: 33.7 g/dL (ref 30.0–36.0)
MCV: 90.1 fL (ref 80.0–100.0)
Monocytes Absolute: 0.5 10*3/uL (ref 0.1–1.0)
Monocytes Relative: 9 %
Neutro Abs: 2.6 10*3/uL (ref 1.7–7.7)
Neutrophils Relative %: 44 %
Platelets: 161 10*3/uL (ref 150–400)
RBC: 4.77 MIL/uL (ref 4.22–5.81)
RDW: 15.6 % — ABNORMAL HIGH (ref 11.5–15.5)
WBC: 5.8 10*3/uL (ref 4.0–10.5)
nRBC: 0 % (ref 0.0–0.2)

## 2022-07-20 LAB — PROTIME-INR
INR: 1.2 (ref 0.8–1.2)
Prothrombin Time: 15 seconds (ref 11.4–15.2)

## 2022-07-20 LAB — HEPATITIS A ANTIBODY, TOTAL: hep A Total Ab: REACTIVE — AB

## 2022-07-20 NOTE — Progress Notes (Signed)
GI Office Note    Referring Provider: Benetta Spar* Primary Care Physician:  Benetta Spar, MD  Primary Gastroenterologist: Hennie Duos. Marletta Lor, DO   Chief Complaint   Chief Complaint  Patient presents with   Follow-up    Doing well, no issues.    History of Present Illness   Kirk Oconnor is a 66 y.o. male presenting today for follow-up of chronic hepatitis C.  He also has a history of chronic GERD.  No prior colonoscopy.  History of aortic valve replacement (bioprosthetic AVR and aortic root replacement with reimplantation of the coronaries 03/2017). He saw cardiology after last visit to establish care prior to considering colonoscopy given EF 40% and prior history of aortic valve placement.  Cardiology saw patient, echo was reassuring with no limitations to pursue endoscopy.  Shortness of breath unexplained, PFTs considered ongoing symptoms.  HCV Ab positive in 2014. Recent labs showing genotype 1a, HCV RNA 110,000. Hep B surface Ag negative, Hep B surface Ab negative, Hep B core total ab neg, HIV negative.   Ultrasound with elastography showed cirrhosis changes, no splenomegaly. Median kPa: 13.2.   Today: presents with desire to be treated for hepatitis C as soon as possible. He denies abdominal pain, constipation, diarrhea, melena, brbpr. Heartburn well controlled on omeprazole. His BP is high today. States he takes pill every other day. Checks his BP at home and "if not high" then doesn't take medication.   Medications   Current Outpatient Medications  Medication Sig Dispense Refill   omeprazole (PRILOSEC) 40 MG capsule Take 40 mg by mouth daily.     sildenafil (VIAGRA) 50 MG tablet Take 1 tablet (50 mg total) by mouth daily as needed for erectile dysfunction. 30 tablet 3   No current facility-administered medications for this visit.    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   CAD (coronary artery disease)    Nonobstructive at cardiac catheterization July 2018 - [facility]   Carpal tunnel syndrome    Essential hypertension    Hepatitis C    Sleep apnea     Past Surgical History   Past Surgical History:  Procedure Laterality Date   AORTIC VALVE REPLACEMENT  03/2017   [facility]    Past Family History   Family History  Problem Relation Age of Onset   Diabetes Mother    Heart attack Mother    Heart attack Father     Past Social History   Social History   Socioeconomic History   Marital status: Divorced    Spouse name: Not on file   Number of children: Not on file   Years of education: Not on file   Highest education level: Not on file  Occupational History   Not on file  Tobacco Use   Smoking status: Former   Smokeless tobacco: Never  Vaping Use   Vaping Use: Never used  Substance and Sexual Activity   Alcohol use: No   Drug use: No   Sexual activity: Yes  Other Topics Concern   Not on file  Social History Narrative  Not on file   Social Determinants of Health   Financial Resource Strain: Not on file  Food Insecurity: Not on file  Transportation Needs: Not on file  Physical Activity: Not on file  Stress: Not on file  Social Connections: Not on file  Intimate Partner Violence: Not on file    Review of Systems   General: Negative for anorexia, weight loss, fever, chills, fatigue, weakness. ENT: Negative for hoarseness, difficulty swallowing , nasal congestion. CV: Negative for chest pain, angina, palpitations, dyspnea on exertion, peripheral edema.  Respiratory: Negative for dyspnea at rest, dyspnea on exertion, cough, sputum, wheezing.  GI: See  history of present illness. GU:  Negative for dysuria, hematuria, urinary incontinence, urinary frequency, nocturnal urination.  Endo: Negative for unusual weight change.     Physical Exam   BP (!) 212/123 (BP Location: Right Arm, Patient Position: Sitting, Cuff Size: Normal)   Pulse 76   Temp (!) 97.3 F (36.3 C) (Oral)   Ht 5\' 7"  (1.702 m)   Wt 175 lb 3.2 oz (79.5 kg)   SpO2 97%   BMI 27.44 kg/m    General: frustrated but cooperative. In no acute distress.  Eyes: No icterus. Mouth: Oropharyngeal mucosa moist and pink , no lesions erythema or exudate. Lungs: Clear to auscultation bilaterally.  Heart: Regular rate and rhythm, no murmurs rubs or gallops.  Abdomen: Bowel sounds are normal, nontender, nondistended, no hepatosplenomegaly or masses,  no abdominal bruits or hernia , no rebound or guarding.  Rectal: not performed Extremities: No lower extremity edema. No clubbing or deformities. Neuro: Alert and oriented x 4   Skin: Warm and dry, no jaundice.   Psych: Alert and cooperative, normal mood and affect.  Labs  See hpi Imaging Studies   See hpi  Assessment   Chronic Hepatitis C: Genotype 1a, treatment naive. 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 He will need further labs to determine Child Pugh Score. Once labs obtained will initiate HCV treatment. Patient highly motivated to be treated.   Colon cancer screening: plan to schedule colonoscopy at a later date per patient request. He wants to be treated for Hep C first. Would consider EGD at the same time to screen for esophageal varices and Barrett's. He will need to resume BP medication, he plans to go by PCP office today. Discussed risk of untreated hypertension at length.    PLAN   Labs. To evaluate immune status for Hep A and Child Pugh/MELD testing. Initiate tx when labs return. Hold off on colonoscopy and possible upper endoscopy for now.    Laureen Ochs. Bobby Rumpf, [place], [place]  [facility]

## 2022-07-20 NOTE — Patient Instructions (Signed)
Please complete labs. As soon as results return, we will move forward with Hepatitis C treatment. We will hold off on scheduling an upper endoscopy and colonoscopy for now as your focus is Hepatitis C treatment, and blood pressure needs to be better controlled.

## 2022-07-21 ENCOUNTER — Telehealth: Payer: Self-pay

## 2022-07-21 DIAGNOSIS — B1920 Unspecified viral hepatitis C without hepatic coma: Secondary | ICD-10-CM

## 2022-07-21 LAB — AFP TUMOR MARKER: AFP, Serum, Tumor Marker: 6.6 ng/mL (ref 0.0–8.4)

## 2022-07-21 MED ORDER — SOFOSBUVIR-VELPATASVIR 400-100 MG PO TABS: 1.0000 | ORAL_TABLET | Freq: Every day | ORAL | 2 refills | Status: DC

## 2022-07-21 NOTE — Addendum Note (Signed)
Addended by: Mahala Menghini on: 07/21/2022 10:41 AM   Modules accepted: Orders

## 2022-07-21 NOTE — Telephone Encounter (Signed)
RX sent. He will need to STOP omeprazole before starting Epclusa. Please make sure we verbalize this to him, if he does not follow directions the Epclusa will not be as effective in getting rid of the hepatitis C.    If you need to take a medication for acid reflux, your choices are as follows: TUMS or Rolaids separated at least four hours from [place].  Pepcid (famotidine) 40mg  up to twice per day administered with Epclusa or 12 hours apart from [place].

## 2022-07-21 NOTE — Telephone Encounter (Signed)
Please send Rx for Epclusa to [facility] that is on file in his chart.

## 2022-07-26 NOTE — Telephone Encounter (Signed)
I don't see [facility] in patient's chart. I wasn't sure which [facility] to add.

## 2022-07-26 NOTE — Telephone Encounter (Signed)
Its [facility]

## 2022-07-26 NOTE — Telephone Encounter (Signed)
Pt was approved for Epclusa, per [employer] pt will need to get medication through [facility]. Please resend RX to [facility]. Pharmacy is in pt's chart.

## 2022-07-28 ENCOUNTER — Telehealth: Payer: Self-pay | Admitting: Internal Medicine

## 2022-07-28 ENCOUNTER — Other Ambulatory Visit: Payer: Self-pay

## 2022-07-28 MED ORDER — SOFOSBUVIR-VELPATASVIR 400-100 MG PO TABS: 1.0000 | ORAL_TABLET | Freq: Every day | ORAL | 2 refills | Status: DC

## 2022-07-28 NOTE — Telephone Encounter (Signed)
I have resent the original Rx for Epclusa to [facility] specialty in order for them to move forward on approving the medication.

## 2022-07-28 NOTE — Telephone Encounter (Signed)
A lady called back and left a phone number that you could reach the patient.  She said she spoke to someone and they told her they couldn't give her any information so that was why she left his number...... [phone]

## 2022-07-28 NOTE — Telephone Encounter (Signed)
See result note.  

## 2022-07-29 NOTE — Addendum Note (Signed)
Addended by: Mahala Menghini on: 07/29/2022 01:00 PM   Modules accepted: Orders

## 2022-07-29 NOTE — Telephone Encounter (Signed)
OK. When the medication comes in (hopefully to our practice and not his home). Let's follow the follow instructions.   He will need labs after the first month is completed. CMET, PT/INR, HCV RNA. Orders placed.   Please make sure he stops his omeprazole and follows #4 below.   He will need follow up office visit in 6 weeks.   Epclusa for Hepatitis C Instructions:   Take Epclusa, one tablet daily with or without food. You should take it at approximately the same time every day.  Do not miss a dose. You will complete 12 weeks of treatment.    Do not run out of Epclusa! If you are down to one week of medication left and have not heard about your next shipment, please let us know as soon as possible.   If you need to start a new medication, prescription from your doctor or over the counter medication, you need to contact us to make sure it does not interfere with Epclusa. There are several medications that can interfere with Epclusa and can make you sick or make the medication not work.  If you need to take a medication for acid reflux, your choices are as follows: TUMS or Rolaids separated at least four hours from [place].  Pepcid (famotidine) 40mg  up to twice per day administered with Epclusa or 12 hours apart from [place].    Tylenol (acetominophen) and Advil (ibuprofen) are safe to take with Epclusa if needed for headache, fever, pain.   You will have and appointment and blood work done after 4 weeks of treatment to make sure you are tolerating Epclusa and to see if the medication is getting rid of the Hepatitis C.   DO NOT stop Epclusa unless instructed to by your provider.  You will also have blood work done at the end of treatment and 12-24 weeks after end of treatment.        Patient signature: ____________________  Date: _______________

## 2022-08-02 NOTE — Telephone Encounter (Signed)
Pt's sister LMOVM to give her a call regarding her brother Kirk Oconnor. [name]. Seen ov where you had already advised this pt no information can be given to her. She can be reached at [phone]

## 2022-08-02 NOTE — Telephone Encounter (Signed)
Medication has been approved and will be delivered on 08/04/2022.

## 2022-08-04 NOTE — Telephone Encounter (Signed)
Medication has been delivered. Pt has been made aware. Pt states that he will be here Friday 08/05/22 to pick it up. Pt was instructed to bring either his medications or a current medication list with him to pick it up. Pt verbalized understanding.

## 2022-08-05 ENCOUNTER — Telehealth: Payer: Self-pay | Admitting: *Deleted

## 2022-08-05 NOTE — Telephone Encounter (Signed)
Pt came  with his sister and picked up Epclusa. Had pt sign instructions and went over medication pt is currently taking. Informed pt to call office with any questions or problems.

## 2022-08-05 NOTE — Telephone Encounter (Signed)
OK we need to make sure he is aware to do blood work after first bottle finished.  He needs to make sure he stops omeprazole (see 07/21/22 telephone note) He still needs to be scheduled for ov in six weeks.

## 2022-08-08 ENCOUNTER — Other Ambulatory Visit: Payer: Self-pay

## 2022-08-08 NOTE — Telephone Encounter (Signed)
Spoke with pt and he states that he has stopped taking the omeprazole and was instructed to call when he takes his last pill so that he can be instructed on where to go for his labs. Pt's medication list has been updated in his chart as well.   Amanda: Please schedule an ov with Verlon Au in six weeks for medication f/u.

## 2022-08-08 NOTE — Telephone Encounter (Signed)
Medication was picked up and medication list has been updated in patient's chart.

## 2022-08-08 NOTE — Telephone Encounter (Signed)
Noted  

## 2022-08-10 ENCOUNTER — Ambulatory Visit (INDEPENDENT_AMBULATORY_CARE_PROVIDER_SITE_OTHER): Payer: Medicaid Other | Admitting: Urology

## 2022-08-10 VITALS — BP 136/105 | HR 65

## 2022-08-10 DIAGNOSIS — N529 Male erectile dysfunction, unspecified: Secondary | ICD-10-CM | POA: Diagnosis not present

## 2022-08-10 MED ORDER — ALPROSTADIL (VASODILATOR) 20 MCG IC KIT: 20.0000 ug | PACK | INTRACAVERNOUS | 12 refills | Status: DC | PRN

## 2022-08-10 NOTE — Progress Notes (Signed)
08/10/2022 10:21 AM   Bethann Berkshire Marlou Starks [id number] [id number]  Referring provider: Benetta Spar, MD [address] [place],  [place] [address]  Followup erectile dysfunction   HPI: [name] is a 66yo here for followup for erectile dysfunction. He is currently using sildenafil 50mg  prn. Sildenafil and tadalafil failed to give him a firm erection. He is interested in pursuing injections.  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 If you have any questions, ask your nurse or doctor.          amLODipine 5 MG tablet Commonly known as: NORVASC Take 5 mg by mouth daily.   aspirin EC 81 MG tablet Take 81 mg by mouth daily.   cholecalciferol 25 MCG (1000 UNIT) tablet Commonly known as: VITAMIN D3 Take 1,000 Units by mouth daily.   ibuprofen 800 MG tablet Commonly known as: ADVIL Take 800 mg by mouth every 6 (six) hours as needed.   omeprazole 40 MG capsule Commonly known as: PRILOSEC Take 40 mg by mouth daily.   sildenafil 50 MG tablet Commonly known as: Viagra Take 1 tablet (50 mg total) by mouth daily as needed for erectile dysfunction.   Sofosbuvir-Velpatasvir 400-100 MG Tabs Commonly known as: Epclusa Take 1 tablet by  mouth daily. For 12 weeks.        Allergies: No Known Allergies  Family History: Family History  Problem Relation Age of Onset   Diabetes Mother    Heart attack Mother    Heart attack Father     Social History:  reports that he has quit smoking. He has never used smokeless tobacco. He reports that he does not drink alcohol and does not use drugs.  ROS: All other review of systems were reviewed and are negative except what is noted above in HPI  Physical Exam: BP (!) 136/105   Pulse 65   Constitutional:  Alert and oriented, No acute distress. HEENT: [place] AT, moist mucus membranes.  Trachea midline, no masses. Cardiovascular: No clubbing, cyanosis, or edema. Respiratory: Normal respiratory effort, no increased work of breathing. GI: Abdomen is soft, nontender, nondistended, no abdominal masses GU: No CVA tenderness.  Lymph: No cervical or inguinal lymphadenopathy. Skin: No rashes, bruises or suspicious lesions. Neurologic: Grossly intact, no focal deficits, moving all 4 extremities. Psychiatric: Normal mood and affect.  Laboratory Data: Lab Results  Component Value Date   WBC 5.8 07/20/2022   HGB 14.5 07/20/2022   HCT 43.0 07/20/2022   MCV 90.1 07/20/2022   PLT 161 07/20/2022    Lab Results  Component Value Date   CREATININE 0.97 07/20/2022    Lab Results  Component Value Date   PSA 1.68 03/11/2013    Lab Results  Component Value Date   TESTOSTERONE 691 03/11/2013    Lab Results  Component Value Date   HGBA1C 7.1 (H) 01/27/2022    Urinalysis    Component Value Date/Time   COLORURINE YELLOW 07/12/2017 1604   APPEARANCEUR CLEAR 07/12/2017 1604   LABSPEC 1.011 07/12/2017 1604   PHURINE 6.0 07/12/2017 1604   GLUCOSEU NEGATIVE 07/12/2017 1604   HGBUR NEGATIVE 07/12/2017 1604   BILIRUBINUR NEGATIVE 07/12/2017 1604   KETONESUR NEGATIVE 07/12/2017 1604   PROTEINUR NEGATIVE 07/12/2017 1604   UROBILINOGEN 1.0 11/30/2011 1910   NITRITE NEGATIVE  07/12/2017 1604   LEUKOCYTESUR NEGATIVE 07/12/2017 1604    No results found for: "LABMICR", "WBCUA", "RBCUA", "LABEPIT", "MUCUS", "BACTERIA"  Pertinent Imaging:  No results found for this or any previous visit.  No results found for this or any previous visit.  No results found for this or any previous visit.  No results found for this or any previous visit.  No results found for this or any previous visit.  No valid procedures specified. No results found for this or any previous visit.  No results found for this or any previous visit.   Assessment & Plan:    1. Erectile dysfunction, unspecified erectile dysfunction type -Rx for alprostadil sent to pharmacy. Followup 1 week for injection teaching   No follow-ups on file.  Wilkie Aye, MD  [facility] [place]

## 2022-08-11 ENCOUNTER — Other Ambulatory Visit (HOSPITAL_COMMUNITY): Payer: Self-pay

## 2022-08-21 ENCOUNTER — Encounter: Payer: Self-pay | Admitting: Urology

## 2022-08-21 NOTE — Patient Instructions (Signed)
Erectile Dysfunction ?Erectile dysfunction (ED) is the inability to get or keep an erection in order to have sexual intercourse. ED is considered a symptom of an underlying disorder and is not considered a disease. ED may include: ?Inability to get an erection. ?Lack of enough hardness of the erection to allow penetration. ?Loss of erection before sex is finished. ?What are the causes? ?This condition may be caused by: ?Physical causes, such as: ?Artery problems. This may include heart disease, high blood pressure, atherosclerosis, and diabetes. ?Hormonal problems, such as low testosterone. ?Obesity. ?Nerve problems. This may include back or pelvic injuries, multiple sclerosis, Parkinson's disease, spinal cord injury, and stroke. ?Certain medicines, such as: ?Pain relievers. ?Antidepressants. ?Blood pressure medicines and water pills (diuretics). ?Cancer medicines. ?Antihistamines. ?Muscle relaxants. ?Lifestyle factors, such as: ?Use of drugs such as marijuana, cocaine, or opioids. ?Excessive use of alcohol. ?Smoking. ?Lack of physical activity or exercise. ?Psychological causes, such as: ?Anxiety or stress. ?Sadness or depression. ?Exhaustion. ?Fear about sexual performance. ?Guilt. ?What are the signs or symptoms? ?Symptoms of this condition include: ?Inability to get an erection. ?Lack of enough hardness of the erection to allow penetration. ?Loss of the erection before sex is finished. ?Sometimes having normal erections, but with frequent unsatisfactory episodes. ?Low sexual satisfaction in either partner due to erection problems. ?A curved penis occurring with erection. The curve may cause pain, or the penis may be too curved to allow for intercourse. ?Never having nighttime or morning erections. ?How is this diagnosed? ?This condition is often diagnosed by: ?Performing a physical exam to find other diseases or specific problems with the penis. ?Asking you detailed questions about the problem. ?Doing tests,  such as: ?Blood tests to check for diabetes mellitus or high cholesterol, or to measure hormone levels. ?Other tests to check for underlying health conditions. ?An ultrasound exam to check for scarring. ?A test to check blood flow to the penis. ?Doing a sleep study at home to measure nighttime erections. ?How is this treated? ?This condition may be treated by: ?Medicines, such as: ?Medicine taken by mouth to help you achieve an erection (oral medicine). ?Hormone replacement therapy to replace low testosterone levels. ?Medicine that is injected into the penis. Your health care provider may instruct you how to give yourself these injections at home. ?Medicine that is delivered with a short applicator tube. The tube is inserted into the opening at the tip of the penis, which is the opening of the urethra. A tiny pellet of medicine is put in the urethra. The pellet dissolves and enhances erectile function. This is also called MUSE (medicated urethral system for erections) therapy. ?Vacuum pump. This is a pump with a ring on it. The pump and ring are placed on the penis and used to create pressure that helps the penis become erect. ?Penile implant surgery. In this procedure, you may receive: ?An inflatable implant. This consists of cylinders, a pump, and a reservoir. The cylinders can be inflated with a fluid that helps to create an erection, and they can be deflated after intercourse. ?A semi-rigid implant. This consists of two silicone rubber rods. The rods provide some rigidity. They are also flexible, so the penis can both curve downward in its normal position and become straight for sexual intercourse. ?Blood vessel surgery to improve blood flow to the penis. During this procedure, a blood vessel from a different part of the body is placed into the penis to allow blood to flow around (bypass) damaged or blocked blood vessels. ?Lifestyle changes,   such as exercising more, losing weight, and quitting smoking. ?Follow  these instructions at home: ?Medicines ? ?Take over-the-counter and prescription medicines only as told by your health care provider. Do not increase the dosage without first discussing it with your health care provider. ?If you are using self-injections, do injections as directed by your health care provider. Make sure you avoid any veins that are on the surface of the penis. After giving an injection, apply pressure to the injection site for 5 minutes. ?Talk to your health care provider about how to prevent headaches while taking ED medicines. These medicines may cause a sudden headache due to the increase in blood flow in your body. ?General instructions ?Exercise regularly, as directed by your health care provider. Work with your health care provider to lose weight, if needed. ?Do not use any products that contain nicotine or tobacco. These products include cigarettes, chewing tobacco, and vaping devices, such as e-cigarettes. If you need help quitting, ask your health care provider. ?Before using a vacuum pump, read the instructions that come with the pump and discuss any questions with your health care provider. ?Keep all follow-up visits. This is important. ?Contact a health care provider if: ?You feel nauseous. ?You are vomiting. ?You get sudden headaches while taking ED medicines. ?You have any concerns about your sexual health. ?Get help right away if: ?You are taking oral or injectable medicines and you have an erection that lasts longer than 4 hours. If your health care provider is unavailable, go to the nearest emergency room for evaluation. An erection that lasts much longer than 4 hours can result in permanent damage to your penis. ?You have severe pain in your groin or abdomen. ?You develop redness or severe swelling of your penis. ?You have redness spreading at your groin or lower abdomen. ?You are unable to urinate. ?You experience chest pain or a rapid heartbeat (palpitations) after taking oral  medicines. ?These symptoms may represent a serious problem that is an emergency. Do not wait to see if the symptoms will go away. Get medical help right away. Call your local emergency services (911 in the [place] Do not drive yourself to the hospital. ?Summary ?Erectile dysfunction (ED) is the inability to get or keep an erection during sexual intercourse. ?This condition is diagnosed based on a physical exam, your symptoms, and tests to determine the cause. Treatment varies depending on the cause and may include medicines, hormone therapy, surgery, or a vacuum pump. ?You may need follow-up visits to make sure that you are using your medicines or devices correctly. ?Get help right away if you are taking or injecting medicines and you have an erection that lasts longer than 4 hours. ?This information is not intended to replace advice given to you by your health care provider. Make sure you discuss any questions you have with your health care provider. ?Document Revised: 12/09/2020 Document Reviewed: 12/09/2020 ?Elsevier Patient Education ? 2023 Elsevier Inc. ? ?

## 2022-08-22 ENCOUNTER — Ambulatory Visit: Payer: Medicaid Other | Admitting: Urology

## 2022-09-06 ENCOUNTER — Telehealth: Payer: Self-pay

## 2022-09-06 NOTE — Telephone Encounter (Signed)
Phoned to Kirk Oconnor) and was advised that the pt's medication will be shipped out tomorrow (the 13th).  Phoned the pt's sister Kirk Oconnor and advised her that we will call them when the medication arrives tomorrow. Also they were instructed to fill out the paperwork for [place] to be able to talk to her regarding the pt.

## 2022-09-07 NOTE — Telephone Encounter (Signed)
Pt's medication was delivered today. Contacted pt making him aware. Pt stated that he would be here tomorrow to pick medication up and also will go to the lab to have blood work completed.

## 2022-09-08 ENCOUNTER — Other Ambulatory Visit (HOSPITAL_COMMUNITY)
Admission: RE | Admit: 2022-09-08 | Discharge: 2022-09-08 | Disposition: A | Discharge status: Home / Self Care | Payer: Medicaid Other | Source: Ambulatory Visit | Attending: Gastroenterology | Admitting: Gastroenterology

## 2022-09-08 DIAGNOSIS — B1920 Unspecified viral hepatitis C without hepatic coma: Secondary | ICD-10-CM | POA: Diagnosis present

## 2022-09-08 LAB — COMPREHENSIVE METABOLIC PANEL
ALT: 18 U/L (ref 0–44)
AST: 21 U/L (ref 15–41)
Albumin: 3.4 g/dL — ABNORMAL LOW (ref 3.5–5.0)
Alkaline Phosphatase: 82 U/L (ref 38–126)
Anion gap: 8 (ref 5–15)
BUN: 12 mg/dL (ref 8–23)
CO2: 23 mmol/L (ref 22–32)
Calcium: 9.3 mg/dL (ref 8.9–10.3)
Chloride: 103 mmol/L (ref 98–111)
Creatinine, Ser: 0.94 mg/dL (ref 0.61–1.24)
GFR, Estimated: >60 mL/min (ref >60)
Glucose, Bld: 131 mg/dL — ABNORMAL HIGH (ref 70–99)
Potassium: 3.6 mmol/L (ref 3.5–5.1)
Sodium: 134 mmol/L — ABNORMAL LOW (ref 135–145)
Total Bilirubin: 0.5 mg/dL (ref 0.3–1.2)
Total Protein: 7.8 g/dL (ref 6.5–8.1)

## 2022-09-08 LAB — PROTIME-INR
INR: 1.1 (ref 0.8–1.2)
Prothrombin Time: 13.8 seconds (ref 11.4–15.2)

## 2022-09-09 LAB — HCV RNA QUANT: HCV Quantitative: NOT DETECTED IU/mL (ref >50)

## 2022-09-15 ENCOUNTER — Other Ambulatory Visit: Payer: Self-pay

## 2022-09-15 DIAGNOSIS — B1920 Unspecified viral hepatitis C without hepatic coma: Secondary | ICD-10-CM

## 2022-09-23 ENCOUNTER — Ambulatory Visit: Payer: Medicaid Other | Admitting: Gastroenterology

## 2022-10-06 ENCOUNTER — Other Ambulatory Visit (HOSPITAL_COMMUNITY)
Admission: RE | Admit: 2022-10-06 | Discharge: 2022-10-06 | Disposition: A | Discharge status: Home / Self Care | Payer: Medicaid Other | Source: Ambulatory Visit | Attending: Gastroenterology | Admitting: Gastroenterology

## 2022-10-06 ENCOUNTER — Other Ambulatory Visit: Payer: Self-pay

## 2022-10-06 DIAGNOSIS — B1920 Unspecified viral hepatitis C without hepatic coma: Secondary | ICD-10-CM

## 2022-10-08 LAB — HCV RNA QUANT: HCV Quantitative: NOT DETECTED IU/mL (ref >50)

## 2022-10-24 ENCOUNTER — Ambulatory Visit: Payer: Medicaid Other | Admitting: Gastroenterology

## 2022-10-24 NOTE — Progress Notes (Deleted)
GI Office Note    Referring Provider: Carrolyn Meiers* Primary Care Physician:  Carrolyn Meiers, MD  Primary Gastroenterologist:  Chief Complaint   No chief complaint on file.   History of Present Illness   Kirk Oconnor is a 67 y.o. male presenting today for follow-up.  Last seen October 2023.  History of chronic hepatitis C, chronic GERD.  Plans for colonoscopy and possible EGD after he is treated for hepatitis C. Ultrasound with elastography showed cirrhosis changes, no splenomegaly. Median kPa: 13.2.    Patient started Epclusa around November 14.  At 4 weeks HCV RNA was not detected.  Her numbers normal.  He was instructed to recheck HCVRNA at the end of treatment but he can check this in a little early, again negative on January 11.  History of aortic valve replacement (bioprosthetic AVR and aortic root replacement with reimplantation of the coronaries 03/2017). He saw cardiology after last visit to establish care prior to considering colonoscopy given EF 40% and prior history of aortic valve placement. Cardiology saw patient, echo was reassuring with no limitations to pursue endoscopy (see telephone note 04/2022).   Medications   Current Outpatient Medications  Medication Sig Dispense Refill   alprostadil (EDEX) 20 MCG injection 20 mcg by Intracavitary route as needed for erectile dysfunction. use no more than 3 times per week 1 each 12   amLODipine (NORVASC) 5 MG tablet Take 5 mg by mouth daily.     aspirin EC 81 MG tablet Take 81 mg by mouth daily.     cholecalciferol (VITAMIN D3) 25 MCG (1000 UNIT) tablet Take 1,000 Units by mouth daily.     ibuprofen (ADVIL) 800 MG tablet Take 800 mg by mouth every 6 (six) hours as needed.     omeprazole (PRILOSEC) 40 MG capsule Take 40 mg by mouth daily.     sildenafil (VIAGRA) 50 MG tablet Take 1 tablet (50 mg total) by mouth daily as needed for erectile dysfunction. 30 tablet 3   Sofosbuvir-Velpatasvir (EPCLUSA)  400-100 MG TABS Take 1 tablet by mouth daily. For 12 weeks. 30 tablet 2   No current facility-administered medications for this visit.    Allergies   Allergies as of 10/24/2022   (No Known Allergies)     Past Medical History   Past Medical History:  Diagnosis Date   Aortic regurgitation 03/2017   27 mm Medtronic freestyle porcine root with direct reimplantation of the left and right coronary ostia, replacement of ascending aorta with a 28 mm Gelweave Dacron graft - [facility]   Aortic root aneurysm ([place]) 03/2017   CAD (coronary artery disease)    Nonobstructive at cardiac catheterization July 2018 - [facility]   Carpal tunnel syndrome    Essential hypertension    Hepatitis C    Sleep apnea     Past Surgical History   Past Surgical History:  Procedure Laterality Date   AORTIC VALVE REPLACEMENT  03/2017   [facility]    Past Family History   Family History  Problem Relation Age of Onset   Diabetes Mother    Heart attack Mother    Heart attack Father     Past Social History   Social History   Socioeconomic History   Marital status: Divorced    Spouse name: Not on file   Number of children: Not on file   Years of education: Not on file   Highest education level: Not on file  Occupational History  Not on file  Tobacco Use   Smoking status: Former   Smokeless tobacco: Never  Vaping Use   Vaping Use: Never used  Substance and Sexual Activity   Alcohol use: No   Drug use: No   Sexual activity: Yes  Other Topics Concern   Not on file  Social History Narrative   Not on file   Social Determinants of Health   Financial Resource Strain: Not on file  Food Insecurity: Not on file  Transportation Needs: Not on file  Physical Activity: Not on file  Stress: Not on file  Social Connections: Not on file  Intimate Partner Violence: Not on file    Review of Systems   General: Negative for anorexia, weight loss, fever, chills, fatigue, weakness. ENT: Negative  for hoarseness, difficulty swallowing , nasal congestion. CV: Negative for chest pain, angina, palpitations, dyspnea on exertion, peripheral edema.  Respiratory: Negative for dyspnea at rest, dyspnea on exertion, cough, sputum, wheezing.  GI: See history of present illness. GU:  Negative for dysuria, hematuria, urinary incontinence, urinary frequency, nocturnal urination.  Endo: Negative for unusual weight change.     Physical Exam   There were no vitals taken for this visit.   General: Well-nourished, well-developed in no acute distress.  Eyes: No icterus. Mouth: Oropharyngeal mucosa moist and pink , no lesions erythema or exudate. Lungs: Clear to auscultation bilaterally.  Heart: Regular rate and rhythm, no murmurs rubs or gallops.  Abdomen: Bowel sounds are normal, nontender, nondistended, no hepatosplenomegaly or masses,  no abdominal bruits or hernia , no rebound or guarding.  Rectal: ***  Extremities: No lower extremity edema. No clubbing or deformities. Neuro: Alert and oriented x 4   Skin: Warm and dry, no jaundice.   Psych: Alert and cooperative, normal mood and affect.  Labs   *** Imaging Studies   No results found.  Assessment       PLAN   ***   Laureen Ochs. Bobby Rumpf, [place], [facility]

## 2022-11-11 ENCOUNTER — Telehealth: Payer: Self-pay | Admitting: Gastroenterology

## 2022-11-11 NOTE — Telephone Encounter (Signed)
Patient missed last appt. Please offer to reschedule for follow up cirrhosis, gerd. He is due colonoscopy/egd.   He also needs Hep B vaccinations for liver protection. Please arrange.

## 2022-11-14 NOTE — Telephone Encounter (Signed)
Pt was rescheduled

## 2022-11-18 NOTE — H&P (View-Only) (Signed)
   GI Office Note    Referring Provider: Fanta, Tesfaye Demissie* Primary Care Physician:  Fanta, Tesfaye Demissie, MD  Primary Gastroenterologist:  Chief Complaint   No chief complaint on file.   History of Present Illness     Kirk Oconnor is a 66 y.o. male presenting today for follow-up.  Last seen October 2023.  History of chronic hepatitis C, chronic GERD.  Plans for colonoscopy and possible EGD after he is treated for hepatitis C. Ultrasound with elastography showed cirrhosis changes, no splenomegaly. Median kPa: 13.2.    Patient started Epclusa around November 14.  At 4 weeks HCV RNA was not detected.  Her numbers normal.  He was instructed to recheck HCVRNA at the end of treatment but he can check this in a little early, again negative on January 11.   History of aortic valve replacement (bioprosthetic AVR and aortic root replacement with reimplantation of the coronaries 03/2017). He saw cardiology after last visit to establish care prior to considering colonoscopy given EF 40% and prior history of aortic valve placement. Cardiology saw patient, echo was reassuring with no limitations to pursue endoscopy (see telephone note 04/2022).          Medications   Current Outpatient Medications  Medication Sig Dispense Refill   alprostadil (EDEX) 20 MCG injection 20 mcg by Intracavitary route as needed for erectile dysfunction. use no more than 3 times per week 1 each 12   amLODipine (NORVASC) 5 MG tablet Take 5 mg by mouth daily.     aspirin EC 81 MG tablet Take 81 mg by mouth daily.     cholecalciferol (VITAMIN D3) 25 MCG (1000 UNIT) tablet Take 1,000 Units by mouth daily.     ibuprofen (ADVIL) 800 MG tablet Take 800 mg by mouth every 6 (six) hours as needed.     omeprazole (PRILOSEC) 40 MG capsule Take 40 mg by mouth daily.     sildenafil (VIAGRA) 50 MG tablet Take 1 tablet (50 mg total) by mouth daily as needed for erectile dysfunction. 30 tablet 3    Sofosbuvir-Velpatasvir (EPCLUSA) 400-100 MG TABS Take 1 tablet by mouth daily. For 12 weeks. 30 tablet 2   No current facility-administered medications for this visit.    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   CAD (coronary artery disease)    Nonobstructive at cardiac catheterization July 2018 - [facility]   Carpal tunnel syndrome    Essential hypertension    Hepatitis C    Sleep apnea     Past Surgical History   Past Surgical History:  Procedure Laterality Date   AORTIC VALVE REPLACEMENT  03/2017   [facility]    Past Family History   Family History  Problem Relation Age of Onset   Diabetes Mother    Heart attack Mother    Heart attack Father     Past Social History   Social History   Socioeconomic History   Marital status: Divorced    Spouse name: Not on file   Number of children: Not on file   Years of education: Not on file     Highest education level: Not on file  Occupational History   Not on file  Tobacco Use   Smoking status: Former   Smokeless tobacco: Never  Vaping Use   Vaping Use: Never used  Substance and Sexual Activity   Alcohol use: No   Drug use: No   Sexual activity: Yes  Other Topics Concern   Not on file  Social History Narrative   Not on file   Social Determinants of Health   Financial Resource Strain: Not on file  Food Insecurity: Not on file  Transportation Needs: Not on file  Physical Activity: Not on file  Stress: Not on file  Social Connections: Not on file  Intimate Partner Violence: Not on file    Review of Systems   General: Negative for anorexia, weight loss, fever, chills,  fatigue, weakness. ENT: Negative for hoarseness, difficulty swallowing , nasal congestion. CV: Negative for chest pain, angina, palpitations, dyspnea on exertion, peripheral edema.  Respiratory: Negative for dyspnea at rest, dyspnea on exertion, cough, sputum, wheezing.  GI: See history of present illness. GU:  Negative for dysuria, hematuria, urinary incontinence, urinary frequency, nocturnal urination.  Endo: Negative for unusual weight change.     Physical Exam   There were no vitals taken for this visit.   General: Well-nourished, well-developed in no acute distress.  Eyes: No icterus. Mouth: Oropharyngeal mucosa moist and pink , no lesions erythema or exudate. Lungs: Clear to auscultation bilaterally.  Heart: Regular rate and rhythm, no murmurs rubs or gallops.  Abdomen: Bowel sounds are normal, nontender, nondistended, no hepatosplenomegaly or masses,  no abdominal bruits or hernia , no rebound or guarding.  Rectal: ***  Extremities: No lower extremity edema. No clubbing or deformities. Neuro: Alert and oriented x 4   Skin: Warm and dry, no jaundice.   Psych: Alert and cooperative, normal mood and affect.  Labs   *** Imaging Studies   No results found.  Assessment       PLAN   ***   Laylee Schooley S. Fallon Howerter, MHS, PA-C [facility]  

## 2022-11-18 NOTE — Progress Notes (Signed)
GI Office Note    Referring Provider: Benetta Spar* Primary Care Physician:  Benetta Spar, MD  Primary Gastroenterologist:  Chief Complaint   No chief complaint on file.   History of Present Illness     Kirk Oconnor is a 67 y.o. male presenting today for follow-up.  Last seen October 2023.  History of chronic hepatitis C, chronic GERD.  Plans for colonoscopy and possible EGD after he is treated for hepatitis C. Ultrasound with elastography showed cirrhosis changes, no splenomegaly. Median kPa: 13.2.    Patient started Epclusa around November 14.  At 4 weeks HCV RNA was not detected.  Her numbers normal.  He was instructed to recheck HCVRNA at the end of treatment but he can check this in a little early, again negative on January 11.   History of aortic valve replacement (bioprosthetic AVR and aortic root replacement with reimplantation of the coronaries 03/2017). He saw cardiology after last visit to establish care prior to considering colonoscopy given EF 40% and prior history of aortic valve placement. Cardiology saw patient, echo was reassuring with no limitations to pursue endoscopy (see telephone note 04/2022).          Medications   Current Outpatient Medications  Medication Sig Dispense Refill   alprostadil (EDEX) 20 MCG injection 20 mcg by Intracavitary route as needed for erectile dysfunction. use no more than 3 times per week 1 each 12   amLODipine (NORVASC) 5 MG tablet Take 5 mg by mouth daily.     aspirin EC 81 MG tablet Take 81 mg by mouth daily.     cholecalciferol (VITAMIN D3) 25 MCG (1000 UNIT) tablet Take 1,000 Units by mouth daily.     ibuprofen (ADVIL) 800 MG tablet Take 800 mg by mouth every 6 (six) hours as needed.     omeprazole (PRILOSEC) 40 MG capsule Take 40 mg by mouth daily.     sildenafil (VIAGRA) 50 MG tablet Take 1 tablet (50 mg total) by mouth daily as needed for erectile dysfunction. 30 tablet 3    Sofosbuvir-Velpatasvir (EPCLUSA) 400-100 MG TABS Take 1 tablet by mouth daily. For 12 weeks. 30 tablet 2   No current facility-administered medications for this visit.    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   CAD (coronary artery disease)    Nonobstructive at cardiac catheterization July 2018 - [facility]   Carpal tunnel syndrome    Essential hypertension    Hepatitis C    Sleep apnea     Past Surgical History   Past Surgical History:  Procedure Laterality Date   AORTIC VALVE REPLACEMENT  03/2017   [facility]    Past Family History   Family History  Problem Relation Age of Onset   Diabetes Mother    Heart attack Mother    Heart attack Father     Past Social History   Social History   Socioeconomic History   Marital status: Divorced    Spouse name: Not on file   Number of children: Not on file   Years of education: Not on file  Highest education level: Not on file  Occupational History   Not on file  Tobacco Use   Smoking status: Former   Smokeless tobacco: Never  Vaping Use   Vaping Use: Never used  Substance and Sexual Activity   Alcohol use: No   Drug use: No   Sexual activity: Yes  Other Topics Concern   Not on file  Social History Narrative   Not on file   Social Determinants of Health   Financial Resource Strain: Not on file  Food Insecurity: Not on file  Transportation Needs: Not on file  Physical Activity: Not on file  Stress: Not on file  Social Connections: Not on file  Intimate Partner Violence: Not on file    Review of Systems   General: Negative for anorexia, weight loss, fever, chills,  fatigue, weakness. ENT: Negative for hoarseness, difficulty swallowing , nasal congestion. CV: Negative for chest pain, angina, palpitations, dyspnea on exertion, peripheral edema.  Respiratory: Negative for dyspnea at rest, dyspnea on exertion, cough, sputum, wheezing.  GI: See history of present illness. GU:  Negative for dysuria, hematuria, urinary incontinence, urinary frequency, nocturnal urination.  Endo: Negative for unusual weight change.     Physical Exam   There were no vitals taken for this visit.   General: Well-nourished, well-developed in no acute distress.  Eyes: No icterus. Mouth: Oropharyngeal mucosa moist and pink , no lesions erythema or exudate. Lungs: Clear to auscultation bilaterally.  Heart: Regular rate and rhythm, no murmurs rubs or gallops.  Abdomen: Bowel sounds are normal, nontender, nondistended, no hepatosplenomegaly or masses,  no abdominal bruits or hernia , no rebound or guarding.  Rectal: ***  Extremities: No lower extremity edema. No clubbing or deformities. Neuro: Alert and oriented x 4   Skin: Warm and dry, no jaundice.   Psych: Alert and cooperative, normal mood and affect.  Labs   *** Imaging Studies   No results found.  Assessment       PLAN   ***   Leanna Battles. Melvyn Neth, MHS, PA-C [facility]

## 2022-11-21 ENCOUNTER — Telehealth: Payer: Self-pay | Admitting: Gastroenterology

## 2022-11-21 ENCOUNTER — Ambulatory Visit (INDEPENDENT_AMBULATORY_CARE_PROVIDER_SITE_OTHER): Payer: Medicaid Other | Admitting: Gastroenterology

## 2022-11-21 ENCOUNTER — Encounter: Payer: Self-pay | Admitting: Gastroenterology

## 2022-11-21 VITALS — BP 121/87 | HR 90 | Temp 97.9°F | Ht 67.0 in | Wt 162.0 lb

## 2022-11-21 DIAGNOSIS — K219 Gastro-esophageal reflux disease without esophagitis: Secondary | ICD-10-CM | POA: Diagnosis not present

## 2022-11-21 DIAGNOSIS — K7460 Unspecified cirrhosis of liver: Secondary | ICD-10-CM

## 2022-11-21 NOTE — Patient Instructions (Addendum)
Colonoscopy and upper endoscopy to be scheduled for March. We will remind you when your next labs are due, plan for 01/2023.  We recommend hepatitis B vaccination series, this can be done with the local pharmacy or with your PCP.

## 2022-11-21 NOTE — Telephone Encounter (Signed)
Please NIC for RUQ U/S in 01/2023. Dx; cirrhosis, hepatoma screening

## 2022-11-22 ENCOUNTER — Other Ambulatory Visit: Payer: Self-pay | Admitting: *Deleted

## 2022-11-22 ENCOUNTER — Other Ambulatory Visit (HOSPITAL_COMMUNITY)
Admission: RE | Admit: 2022-11-22 | Discharge: 2022-11-22 | Disposition: A | Discharge status: Home / Self Care | Payer: Medicaid Other | Source: Ambulatory Visit | Attending: Pulmonary Disease | Admitting: Pulmonary Disease

## 2022-11-22 ENCOUNTER — Other Ambulatory Visit: Payer: Self-pay

## 2022-11-22 ENCOUNTER — Telehealth: Payer: Self-pay | Admitting: *Deleted

## 2022-11-22 ENCOUNTER — Encounter: Payer: Self-pay | Admitting: *Deleted

## 2022-11-22 DIAGNOSIS — B1920 Unspecified viral hepatitis C without hepatic coma: Secondary | ICD-10-CM

## 2022-11-22 DIAGNOSIS — K7460 Unspecified cirrhosis of liver: Secondary | ICD-10-CM

## 2022-11-22 DIAGNOSIS — E118 Type 2 diabetes mellitus with unspecified complications: Secondary | ICD-10-CM | POA: Diagnosis present

## 2022-11-22 MED ORDER — PEG 3350-KCL-NA BICARB-NACL 420 G PO SOLR: 4000.0000 mL | Freq: Once | ORAL | 0 refills | Status: AC

## 2022-11-22 NOTE — Telephone Encounter (Signed)
Pt has been scheduled for 12/15/22. Instructions mailed and prep sent to the pharmacy.  [employer] PA: The prior authorization/notification reference number is: [id number]

## 2022-11-22 NOTE — Telephone Encounter (Signed)
LMOVM of pt's sister Penelope Coop [phone]) to call back to schedule her brothers procedure.  TCS/EGD w/Dr.Carver, ASA 3

## 2022-11-23 ENCOUNTER — Encounter: Payer: Self-pay | Admitting: *Deleted

## 2022-11-24 LAB — HEMOGLOBIN A1C
Hgb A1c MFr Bld: 6.6 % — ABNORMAL HIGH (ref 4.8–5.6)
Mean Plasma Glucose: 143 mg/dL

## 2022-12-09 NOTE — Patient Instructions (Addendum)
Kirk Oconnor  12/09/2022     @PREFPERIOPPHARMACY @   Your procedure is scheduled on  12/15/2022.   Report to Kirk Oconnor at  0800  A.M.   Call this number if you have problems the morning of surgery:  [phone]  If you experience any cold or flu symptoms such as cough, fever, chills, shortness of breath, etc. between now and your scheduled surgery, please notify us at the above number.   Remember:  Follow the diet and prep instructions given to you by the office.     Take these medicines the morning of surgery with A SIP OF WATER                                 amlodipine, omeprazole.     Do not wear jewelry, make-up or nail polish.  Do not wear lotions, powders, or perfumes, or deodorant.  Do not shave 48 hours prior to surgery.  Men may shave face and neck.  Do not bring valuables to the hospital.  [facility] is not responsible for any belongings or valuables.  Contacts, dentures or bridgework may not be worn into surgery.  Leave your suitcase in the car.  After surgery it may be brought to your room.  For patients admitted to the hospital, discharge time will be determined by your treatment team.  Patients discharged the day of surgery will not be allowed to drive home and must have someone with them for 24 hours.    Special instructions:   DO NOT smoke tobacco or vape for 24 hours before your procedure.  Please read over the following fact sheets that you were given. Anesthesia Post-op Instructions and Care and Recovery After Surgery       Upper Endoscopy, Adult, Care After After the procedure, it is common to have a sore throat. It is also common to have: Mild stomach pain or discomfort. Bloating. Nausea. Follow these instructions at home: The instructions below may help you care for yourself at home. Your health care provider may give you more instructions. If you have questions, ask your health care provider. If you were given a sedative  during the procedure, it can affect you for several hours. Do not drive or operate machinery until your health care provider says that it is safe. If you will be going home right after the procedure, plan to have a responsible adult: Take you home from the hospital or clinic. You will not be allowed to drive. Care for you for the time you are told. Follow instructions from your health care provider about what you may eat and drink. Return to your normal activities as told by your health care provider. Ask your health care provider what activities are safe for you. Take over-the-counter and prescription medicines only as told by your health care provider. Contact a health care provider if you: Have a sore throat that lasts longer than one day. Have trouble swallowing. Have a fever. Get help right away if you: Vomit blood or your vomit looks like coffee grounds. Have bloody, black, or tarry stools. Have a very bad sore throat or you cannot swallow. Have difficulty breathing or very bad pain in your chest or abdomen. These symptoms may be an emergency. Get help right away. Call 911. Do not wait to see if the symptoms will go away. Do not drive yourself to the  hospital. Summary After the procedure, it is common to have a sore throat, mild stomach discomfort, bloating, and nausea. If you were given a sedative during the procedure, it can affect you for several hours. Do not drive until your health care provider says that it is safe. Follow instructions from your health care provider about what you may eat and drink. Return to your normal activities as told by your health care provider. This information is not intended to replace advice given to you by your health care provider. Make sure you discuss any questions you have with your health care provider. Document Revised: 12/22/2021 Document Reviewed: 12/22/2021 Elsevier Patient Education  Kirk Oconnor. Colonoscopy, Adult, Care After The  following information offers guidance on how to care for yourself after your procedure. Your health care provider may also give you more specific instructions. If you have problems or questions, contact your health care provider. What can I expect after the procedure? After the procedure, it is common to have: A small amount of blood in your stool for 24 hours after the procedure. Some gas. Mild cramping or bloating of your abdomen. Follow these instructions at home: Eating and drinking  Drink enough fluid to keep your urine pale yellow. Follow instructions from your health care provider about eating or drinking restrictions. Resume your normal diet as told by your health care provider. Avoid heavy or fried foods that are hard to digest. Activity Rest as told by your health care provider. Avoid sitting for a long time without moving. Get up to take short walks every 1-2 hours. This is important to improve blood flow and breathing. Ask for help if you feel weak or unsteady. Return to your normal activities as told by your health care provider. Ask your health care provider what activities are safe for you. Managing cramping and bloating  Try walking around when you have cramps or feel bloated. If directed, apply heat to your abdomen as told by your health care provider. Use the heat source that your health care provider recommends, such as a moist heat pack or a heating pad. Place a towel between your skin and the heat source. Leave the heat on for 20-30 minutes. Remove the heat if your skin turns bright red. This is especially important if you are unable to feel pain, heat, or cold. You have a greater risk of getting burned. General instructions If you were given a sedative during the procedure, it can affect you for several hours. Do not drive or operate machinery until your health care provider says that it is safe. For the first 24 hours after the procedure: Do not sign important  documents. Do not drink alcohol. Do your regular daily activities at a slower pace than normal. Eat soft foods that are easy to digest. Take over-the-counter and prescription medicines only as told by your health care provider. Keep all follow-up visits. This is important. Contact a health care provider if: You have blood in your stool 2-3 days after the procedure. Get help right away if: You have more than a small spotting of blood in your stool. You have large blood clots in your stool. You have swelling of your abdomen. You have nausea or vomiting. You have a fever. You have increasing pain in your abdomen that is not relieved with medicine. These symptoms may be an emergency. Get help right away. Call 911. Do not wait to see if the symptoms will go away. Do not drive yourself to the  hospital. Summary After the procedure, it is common to have a small amount of blood in your stool. You may also have mild cramping and bloating of your abdomen. If you were given a sedative during the procedure, it can affect you for several hours. Do not drive or operate machinery until your health care provider says that it is safe. Get help right away if you have a lot of blood in your stool, nausea or vomiting, a fever, or increased pain in your abdomen. This information is not intended to replace advice given to you by your health care provider. Make sure you discuss any questions you have with your health care provider. Document Revised: 05/05/2021 Document Reviewed: 05/05/2021 Elsevier Patient Education  [place] After The following information offers guidance on how to care for yourself after your procedure. Your health care provider may also give you more specific instructions. If you have problems or questions, contact your health care provider. What can I expect after the procedure? After the procedure, it is common to have: Tiredness. Little or no  memory about what happened during or after the procedure. Impaired judgment when it comes to making decisions. Nausea or vomiting. Some trouble with balance. Follow these instructions at home: For the time period you were told by your health care provider:  Rest. Do not participate in activities where you could fall or become injured. Do not drive or use machinery. Do not drink alcohol. Do not take sleeping pills or medicines that cause drowsiness. Do not make important decisions or sign legal documents. Do not take care of children on your own. Medicines Take over-the-counter and prescription medicines only as told by your health care provider. If you were prescribed antibiotics, take them as told by your health care provider. Do not stop using the antibiotic even if you start to feel better. Eating and drinking Follow instructions from your health care provider about what you may eat and drink. Drink enough fluid to keep your urine pale yellow. If you vomit: Drink clear fluids slowly and in small amounts as you are able. Clear fluids include water, ice chips, low-calorie sports drinks, and fruit juice that has water added to it (diluted fruit juice). Eat light and bland foods in small amounts as you are able. These foods include bananas, applesauce, rice, lean meats, toast, and crackers. General instructions  Have a responsible adult stay with you for the time you are told. It is important to have someone help care for you until you are awake and alert. If you have sleep apnea, surgery and some medicines can increase your risk for breathing problems. Follow instructions from your health care provider about wearing your sleep device: When you are sleeping. This includes during daytime naps. While taking prescription pain medicines, sleeping medicines, or medicines that make you drowsy. Do not use any products that contain nicotine or tobacco. These products include cigarettes, chewing  tobacco, and vaping devices, such as e-cigarettes. If you need help quitting, ask your health care provider. Contact a health care provider if: You feel nauseous or vomit every time you eat or drink. You feel light-headed. You are still sleepy or having trouble with balance after 24 hours. You get a rash. You have a fever. You have redness or swelling around the IV site. Get help right away if: You have trouble breathing. You have new confusion after you get home. These symptoms may be an emergency. Get help right away. Call  911. Do not wait to see if the symptoms will go away. Do not drive yourself to the hospital. This information is not intended to replace advice given to you by your health care provider. Make sure you discuss any questions you have with your health care provider. Document Revised: 02/07/2022 Document Reviewed: 02/07/2022 Elsevier Patient Education  [place].

## 2022-12-13 ENCOUNTER — Encounter (HOSPITAL_COMMUNITY)
Admission: RE | Admit: 2022-12-13 | Discharge: 2022-12-13 | Disposition: A | Discharge status: Home / Self Care | Payer: Medicaid Other | Source: Ambulatory Visit | Attending: Internal Medicine | Admitting: Internal Medicine

## 2022-12-13 ENCOUNTER — Encounter (HOSPITAL_COMMUNITY): Payer: Self-pay

## 2022-12-13 VITALS — BP 120/83 | HR 76 | Temp 98.3°F | Resp 18 | Ht 67.0 in | Wt 162.0 lb

## 2022-12-13 DIAGNOSIS — K7460 Unspecified cirrhosis of liver: Secondary | ICD-10-CM | POA: Insufficient documentation

## 2022-12-13 DIAGNOSIS — Z01818 Encounter for other preprocedural examination: Secondary | ICD-10-CM | POA: Diagnosis present

## 2022-12-13 HISTORY — DX: Anxiety disorder, unspecified: F41.9

## 2022-12-13 HISTORY — DX: Type 2 diabetes mellitus without complications: E11.9

## 2022-12-13 HISTORY — DX: Unspecified osteoarthritis, unspecified site: M19.90

## 2022-12-13 HISTORY — DX: Depression, unspecified: F32.A

## 2022-12-13 HISTORY — DX: Gastro-esophageal reflux disease without esophagitis: K21.9

## 2022-12-13 LAB — CBC WITH DIFFERENTIAL/PLATELET
Abs Immature Granulocytes: 0.01 10*3/uL (ref 0.00–0.07)
Basophils Absolute: 0 10*3/uL (ref 0.0–0.1)
Basophils Relative: 1 %
Eosinophils Absolute: 0.2 10*3/uL (ref 0.0–0.5)
Eosinophils Relative: 3 %
HCT: 40.8 % (ref 39.0–52.0)
Hemoglobin: 13.7 g/dL (ref 13.0–17.0)
Immature Granulocytes: 0 %
Lymphocytes Relative: 48 %
Lymphs Abs: 3 10*3/uL (ref 0.7–4.0)
MCH: 29.8 pg (ref 26.0–34.0)
MCHC: 33.6 g/dL (ref 30.0–36.0)
MCV: 88.9 fL (ref 80.0–100.0)
Monocytes Absolute: 0.6 10*3/uL (ref 0.1–1.0)
Monocytes Relative: 9 %
Neutro Abs: 2.4 10*3/uL (ref 1.7–7.7)
Neutrophils Relative %: 39 %
Platelets: 143 10*3/uL — ABNORMAL LOW (ref 150–400)
RBC: 4.59 MIL/uL (ref 4.22–5.81)
RDW: 14.4 % (ref 11.5–15.5)
WBC: 6.2 10*3/uL (ref 4.0–10.5)
nRBC: 0 % (ref 0.0–0.2)

## 2022-12-13 LAB — COMPREHENSIVE METABOLIC PANEL
ALT: 24 U/L (ref 0–44)
AST: 24 U/L (ref 15–41)
Albumin: 3.6 g/dL (ref 3.5–5.0)
Alkaline Phosphatase: 71 U/L (ref 38–126)
Anion gap: 9 (ref 5–15)
BUN: 11 mg/dL (ref 8–23)
CO2: 21 mmol/L — ABNORMAL LOW (ref 22–32)
Calcium: 9.5 mg/dL (ref 8.9–10.3)
Chloride: 105 mmol/L (ref 98–111)
Creatinine, Ser: 0.77 mg/dL (ref 0.61–1.24)
GFR, Estimated: >60 mL/min (ref >60)
Glucose, Bld: 109 mg/dL — ABNORMAL HIGH (ref 70–99)
Potassium: 3.9 mmol/L (ref 3.5–5.1)
Sodium: 135 mmol/L (ref 135–145)
Total Bilirubin: 0.4 mg/dL (ref 0.3–1.2)
Total Protein: 7.2 g/dL (ref 6.5–8.1)

## 2022-12-13 LAB — PROTIME-INR
INR: 1.1 (ref 0.8–1.2)
Prothrombin Time: 14.1 seconds (ref 11.4–15.2)

## 2022-12-15 ENCOUNTER — Encounter (HOSPITAL_COMMUNITY)
Admission: RE | Disposition: A | Discharge status: Home / Self Care | Payer: Self-pay | Source: Home / Self Care | Attending: Internal Medicine

## 2022-12-15 ENCOUNTER — Ambulatory Visit (HOSPITAL_BASED_OUTPATIENT_CLINIC_OR_DEPARTMENT_OTHER): Payer: Medicaid Other | Admitting: Anesthesiology

## 2022-12-15 ENCOUNTER — Ambulatory Visit (HOSPITAL_COMMUNITY): Payer: Medicaid Other | Admitting: Anesthesiology

## 2022-12-15 ENCOUNTER — Encounter (HOSPITAL_COMMUNITY): Payer: Self-pay

## 2022-12-15 ENCOUNTER — Ambulatory Visit (HOSPITAL_COMMUNITY)
Admission: RE | Admit: 2022-12-15 | Discharge: 2022-12-15 | Disposition: A | Discharge status: Home / Self Care | Payer: Medicaid Other | Attending: Internal Medicine | Admitting: Internal Medicine

## 2022-12-15 DIAGNOSIS — I10 Essential (primary) hypertension: Secondary | ICD-10-CM | POA: Diagnosis not present

## 2022-12-15 DIAGNOSIS — I110 Hypertensive heart disease with heart failure: Secondary | ICD-10-CM | POA: Insufficient documentation

## 2022-12-15 DIAGNOSIS — K2970 Gastritis, unspecified, without bleeding: Secondary | ICD-10-CM

## 2022-12-15 DIAGNOSIS — Z79899 Other long term (current) drug therapy: Secondary | ICD-10-CM | POA: Insufficient documentation

## 2022-12-15 DIAGNOSIS — Z1211 Encounter for screening for malignant neoplasm of colon: Secondary | ICD-10-CM

## 2022-12-15 DIAGNOSIS — I509 Heart failure, unspecified: Secondary | ICD-10-CM | POA: Diagnosis not present

## 2022-12-15 DIAGNOSIS — K7460 Unspecified cirrhosis of liver: Secondary | ICD-10-CM

## 2022-12-15 DIAGNOSIS — K648 Other hemorrhoids: Secondary | ICD-10-CM

## 2022-12-15 DIAGNOSIS — Z87891 Personal history of nicotine dependence: Secondary | ICD-10-CM | POA: Diagnosis not present

## 2022-12-15 DIAGNOSIS — K2100 Gastro-esophageal reflux disease with esophagitis, without bleeding: Secondary | ICD-10-CM

## 2022-12-15 DIAGNOSIS — D123 Benign neoplasm of transverse colon: Secondary | ICD-10-CM | POA: Diagnosis not present

## 2022-12-15 DIAGNOSIS — Z8619 Personal history of other infectious and parasitic diseases: Secondary | ICD-10-CM | POA: Diagnosis not present

## 2022-12-15 DIAGNOSIS — D122 Benign neoplasm of ascending colon: Secondary | ICD-10-CM

## 2022-12-15 DIAGNOSIS — Z953 Presence of xenogenic heart valve: Secondary | ICD-10-CM | POA: Diagnosis not present

## 2022-12-15 DIAGNOSIS — K219 Gastro-esophageal reflux disease without esophagitis: Secondary | ICD-10-CM

## 2022-12-15 DIAGNOSIS — I25119 Atherosclerotic heart disease of native coronary artery with unspecified angina pectoris: Secondary | ICD-10-CM | POA: Diagnosis not present

## 2022-12-15 DIAGNOSIS — K2950 Unspecified chronic gastritis without bleeding: Secondary | ICD-10-CM | POA: Diagnosis not present

## 2022-12-15 DIAGNOSIS — I5031 Acute diastolic (congestive) heart failure: Secondary | ICD-10-CM

## 2022-12-15 DIAGNOSIS — G4730 Sleep apnea, unspecified: Secondary | ICD-10-CM | POA: Diagnosis not present

## 2022-12-15 HISTORY — PX: POLYPECTOMY: SHX5525

## 2022-12-15 HISTORY — PX: COLONOSCOPY WITH PROPOFOL: SHX5780

## 2022-12-15 HISTORY — PX: BIOPSY: SHX5522

## 2022-12-15 HISTORY — PX: ESOPHAGOGASTRODUODENOSCOPY (EGD) WITH PROPOFOL: SHX5813

## 2022-12-15 SURGERY — COLONOSCOPY WITH PROPOFOL
Anesthesia: General

## 2022-12-15 MED ORDER — PROPOFOL 10 MG/ML IV BOLUS
INTRAVENOUS | Status: DC | PRN
  Administered 2022-12-15: 120 mg via INTRAVENOUS
  Administered 2022-12-15: 30 mg via INTRAVENOUS

## 2022-12-15 MED ORDER — LIDOCAINE HCL (CARDIAC) PF 100 MG/5ML IV SOSY
PREFILLED_SYRINGE | INTRAVENOUS | Status: DC | PRN
  Administered 2022-12-15: 100 mg via INTRAVENOUS

## 2022-12-15 MED ORDER — LACTATED RINGERS IV SOLN: INTRAVENOUS | Status: DC

## 2022-12-15 MED ORDER — STERILE WATER FOR IRRIGATION IR SOLN
Status: DC | PRN
  Administered 2022-12-15: 60 mL

## 2022-12-15 MED ORDER — OMEPRAZOLE 40 MG PO CPDR: 40.0000 mg | DELAYED_RELEASE_CAPSULE | Freq: Two times a day (BID) | ORAL | 11 refills | Status: AC

## 2022-12-15 MED ORDER — PHENYLEPHRINE HCL (PRESSORS) 10 MG/ML IV SOLN
INTRAVENOUS | Status: DC | PRN
  Administered 2022-12-15 (×4): 80 ug via INTRAVENOUS

## 2022-12-15 MED ORDER — PROPOFOL 500 MG/50ML IV EMUL
INTRAVENOUS | Status: DC | PRN
  Administered 2022-12-15: 100 ug/kg/min via INTRAVENOUS

## 2022-12-15 NOTE — Op Note (Signed)
[facility] Patient Name: Kirk Oconnor Procedure Date: 12/15/2022 10:16 AM MRN: [id number] Date of Birth: Jan 15, 1956 Attending MD: Elon Alas. Abbey Chatters , [place], [id number] CSN: [id number] Age: 67 Admit Type: Outpatient Procedure:                Upper GI endoscopy Indications:              Heartburn, Cirrhosis rule out esophageal varices Providers:                Elon Alas. Abbey Chatters, DO, Caprice Kluver, Raphael Gibney,                            Technician Referring MD:              Medicines:                See the Anesthesia note for documentation of the                            administered medications Complications:            No immediate complications. Estimated Blood Loss:     Estimated blood loss was minimal. Procedure:                Pre-Anesthesia Assessment:                           - The anesthesia plan was to use monitored                            anesthesia care (MAC).                           After obtaining informed consent, the endoscope was                            passed under direct vision. Throughout the                            procedure, the patient's blood pressure, pulse, and                            oxygen saturations were monitored continuously. The                            [id number]) scope was introduced through the                            mouth, and advanced to the second part of duodenum.                            The upper GI endoscopy was accomplished without                            difficulty. The patient tolerated the procedure                            well. Scope  In: 10:25:49 AM Scope Out: 10:30:15 AM Total Procedure Duration: 0 hours 4 minutes 26 seconds  Findings:      Moderately severe esophagitis with no bleeding was found at the       gastroesophageal junction. Biopsies were taken with a cold forceps for       histology.      There is no endoscopic evidence of varices in the entire esophagus.      Patchy mild  inflammation characterized by erythema was found in the       gastric body. Biopsies were taken with a cold forceps for Helicobacter       pylori testing.      The duodenal bulb, first portion of the duodenum and second portion of       the duodenum were normal. Impression:               - Moderately severe reflux esophagitis with no                            bleeding. Biopsied.                           - Gastritis. Biopsied.                           - Normal duodenal bulb, first portion of the                            duodenum and second portion of the duodenum. Moderate Sedation:      Per Anesthesia Care Recommendation:           - Patient has a contact number available for                            emergencies. The signs and symptoms of potential                            delayed complications were discussed with the                            patient. Return to normal activities tomorrow.                            Written discharge instructions were provided to the                            patient.                           - Resume previous diet.                           - Continue present medications.                           - Await pathology results.                           - Use Prilosec (omeprazole) 40  mg PO daily.                           - Return to GI clinic in 3 months. Procedure Code(s):        --- Professional ---                           [phone], Esophagogastroduodenoscopy, flexible,                            transoral; with biopsy, single or multiple Diagnosis Code(s):        --- Professional ---                           K21.00, Gastro-esophageal reflux disease with                            esophagitis, without bleeding                           K29.70, Gastritis, unspecified, without bleeding                           R12, Heartburn                           K74.60, Unspecified cirrhosis of liver CPT copyright 2022 American Medical Association. All  rights reserved. The codes documented in this report are preliminary and upon coder review may  be revised to meet current compliance requirements. Elon Alas. Abbey Chatters, DO [place] Abbey Chatters, DO 12/15/2022 10:32:51 AM This report has been signed electronically. Number of Addenda: 0

## 2022-12-15 NOTE — Anesthesia Procedure Notes (Signed)
Date/Time: 12/15/2022 10:18 AM  Performed by: Vista Deck, CRNAPre-anesthesia Checklist: Patient identified, Emergency Drugs available, Suction available, Timeout performed and Patient being monitored Patient Re-evaluated:Patient Re-evaluated prior to induction Oxygen Delivery Method: Nasal Cannula

## 2022-12-15 NOTE — Anesthesia Postprocedure Evaluation (Signed)
Anesthesia Post Note  Patient: ROXANNE DYCK  Procedure(s) Performed: COLONOSCOPY WITH PROPOFOL ESOPHAGOGASTRODUODENOSCOPY (EGD) WITH PROPOFOL BIOPSY POLYPECTOMY  Patient location during evaluation: Phase II Anesthesia Type: General Level of consciousness: awake and alert and oriented Pain management: pain level controlled Vital Signs Assessment: post-procedure vital signs reviewed and stable Respiratory status: spontaneous breathing, nonlabored ventilation and respiratory function stable Cardiovascular status: blood pressure returned to baseline and stable Postop Assessment: no apparent nausea or vomiting Anesthetic complications: no  No notable events documented.   Last Vitals:  Vitals:   12/15/22 0917 12/15/22 1053  BP: (!) 133/96   Pulse: 88 65  Resp: 18 18  Temp: 36.9 C 36.6 C  SpO2: 100% 97%    Last Pain:  Vitals:   12/15/22 1053  TempSrc: Axillary  PainSc: 0-No pain                 Ariele Vidrio C Lavel Rieman

## 2022-12-15 NOTE — Transfer of Care (Signed)
Immediate Anesthesia Transfer of Care Note  Patient: Kirk Oconnor  Procedure(s) Performed: COLONOSCOPY WITH PROPOFOL ESOPHAGOGASTRODUODENOSCOPY (EGD) WITH PROPOFOL BIOPSY POLYPECTOMY  Patient Location: Short Stay  Anesthesia Type:General  Level of Consciousness: awake and patient cooperative  Airway & Oxygen Therapy: Patient Spontanous Breathing  Post-op Assessment: Report given to RN and Post -op Vital signs reviewed and stable  Post vital signs: Reviewed and stable  Last Vitals:  Vitals Value Taken Time  BP 96/66 12/15/2022 1059  Temp 36.6 C 12/15/22 1053  Pulse 65 12/15/22 1053  Resp 18 12/15/22 1053  SpO2 97 % 12/15/22 1053    Last Pain:  Vitals:   12/15/22 1053  TempSrc: Axillary  PainSc: 0-No pain         Complications: No notable events documented.

## 2022-12-15 NOTE — Discharge Instructions (Addendum)
EGD Discharge instructions Please read the instructions outlined below and refer to this sheet in the next few weeks. These discharge instructions provide you with general information on caring for yourself after you leave the hospital. Your doctor may also give you specific instructions. While your treatment has been planned according to the most current medical practices available, unavoidable complications occasionally occur. If you have any problems or questions after discharge, please call your doctor. ACTIVITY You may resume your regular activity but move at a slower pace for the next 24 hours.  Take frequent rest periods for the next 24 hours.  Walking will help expel (get rid of) the air and reduce the bloated feeling in your abdomen.  No driving for 24 hours (because of the anesthesia (medicine) used during the test).  You may shower.  Do not sign any important legal documents or operate any machinery for 24 hours (because of the anesthesia used during the test).  NUTRITION Drink plenty of fluids.  You may resume your normal diet.  Begin with a light meal and progress to your normal diet.  Avoid alcoholic beverages for 24 hours or as instructed by your caregiver.  MEDICATIONS You may resume your normal medications unless your caregiver tells you otherwise.  WHAT YOU CAN EXPECT TODAY You may experience abdominal discomfort such as a feeling of fullness or "gas" pains.  FOLLOW-UP Your doctor will discuss the results of your test with you.  SEEK IMMEDIATE MEDICAL ATTENTION IF ANY OF THE FOLLOWING OCCUR: Excessive nausea (feeling sick to your stomach) and/or vomiting.  Severe abdominal pain and distention (swelling).  Trouble swallowing.  Temperature over 101 F (37.8 C).  Rectal bleeding or vomiting of blood.     Colonoscopy Discharge Instructions  Read the instructions outlined below and refer to this sheet in the next few weeks. These discharge instructions provide you with  general information on caring for yourself after you leave the hospital. Your doctor may also give you specific instructions. While your treatment has been planned according to the most current medical practices available, unavoidable complications occasionally occur.   ACTIVITY You may resume your regular activity, but move at a slower pace for the next 24 hours.  Take frequent rest periods for the next 24 hours.  Walking will help get rid of the air and reduce the bloated feeling in your belly (abdomen).  No driving for 24 hours (because of the medicine (anesthesia) used during the test).   Do not sign any important legal documents or operate any machinery for 24 hours (because of the anesthesia used during the test).  NUTRITION Drink plenty of fluids.  You may resume your normal diet as instructed by your doctor.  Begin with a light meal and progress to your normal diet. Heavy or fried foods are harder to digest and may make you feel sick to your stomach (nauseated).  Avoid alcoholic beverages for 24 hours or as instructed.  MEDICATIONS You may resume your normal medications unless your doctor tells you otherwise.  WHAT YOU CAN EXPECT TODAY Some feelings of bloating in the abdomen.  Passage of more gas than usual.  Spotting of blood in your stool or on the toilet paper.  IF YOU HAD POLYPS REMOVED DURING THE COLONOSCOPY: No aspirin products for 7 days or as instructed.  No alcohol for 7 days or as instructed.  Eat a soft diet for the next 24 hours.  FINDING OUT THE RESULTS OF YOUR TEST Not all test results are  available during your visit. If your test results are not back during the visit, make an appointment with your caregiver to find out the results. Do not assume everything is normal if you have not heard from your caregiver or the medical facility. It is important for you to follow up on all of your test results.  SEEK IMMEDIATE MEDICAL ATTENTION IF: You have more than a spotting of  blood in your stool.  Your belly is swollen (abdominal distention).  You are nauseated or vomiting.  You have a temperature over 101.  You have abdominal pain or discomfort that is severe or gets worse throughout the day.   Your EGD revealed mild amount inflammation in your stomach.  I took biopsies of this to rule out infection with a bacteria called H. pylori.  Await pathology results, my office will contact you.  You also had evidence of reflux esophagitis.  I took samples of this as well.  Small bowel appeared normal.  I am going to increase your omeprazole to 40 mg twice daily.  I have sent this to your pharmacy.  Your colonoscopy revealed 2 polyp(s) which I removed successfully. Await pathology results, my office will contact you. I recommend repeating colonoscopy in 7 years for surveillance purposes.   Follow-up in GI clinic in 3 months.  I hope you have a great rest of your week!  Elon Alas. Abbey Chatters, D.O. Gastroenterology and Hepatology [facility]

## 2022-12-15 NOTE — Op Note (Signed)
[facility] Patient Name: Kirk Oconnor Procedure Date: 12/15/2022 10:16 AM MRN: [id number] Date of Birth: 10-Apr-1956 Attending MD: Elon Alas. Abbey Chatters , [place], [id number] CSN: [id number] Age: 67 Admit Type: Outpatient Procedure:                Colonoscopy Indications:              Screening for colorectal malignant neoplasm Providers:                Elon Alas. Abbey Chatters, DO, Caprice Kluver, Raphael Gibney,                            Technician Referring MD:              Medicines:                See the Anesthesia note for documentation of the                            administered medications Complications:            No immediate complications. Estimated Blood Loss:     Estimated blood loss was minimal. Procedure:                Pre-Anesthesia Assessment:                           - The anesthesia plan was to use monitored                            anesthesia care (MAC).                           After obtaining informed consent, the colonoscope                            was passed under direct vision. Throughout the                            procedure, the patient's blood pressure, pulse, and                            oxygen saturations were monitored continuously. The                            [id number]-[id number]) scope was introduced through                            the anus and advanced to the the cecum, identified                            by appendiceal orifice and ileocecal valve. The                            colonoscopy was performed without difficulty. The                            patient tolerated the procedure well. The  quality                            of the bowel preparation was evaluated using the                            BBPS [facility] Bowel Preparation Scale) with scores                            of: Right Colon = 3, Transverse Colon = 3 and Left                            Colon = 3 (entire mucosa seen well with no residual                             staining, small fragments of stool or opaque                            liquid). The total BBPS score equals 9. Scope In: 10:36:20 AM Scope Out: 10:49:58 AM Scope Withdrawal Time: 0 hours 12 minutes 42 seconds  Total Procedure Duration: 0 hours 13 minutes 38 seconds  Findings:      Non-bleeding internal hemorrhoids were found during endoscopy.      Two sessile polyps were found in the transverse colon and ascending       colon. The polyps were 4 to 6 mm in size. These polyps were removed with       a cold snare. Resection and retrieval were complete.      The exam was otherwise without abnormality. Impression:               - Non-bleeding internal hemorrhoids.                           - Two 4 to 6 mm polyps in the transverse colon and                            in the ascending colon, removed with a cold snare.                            Resected and retrieved.                           - The examination was otherwise normal. Moderate Sedation:      Per Anesthesia Care Recommendation:           - Patient has a contact number available for                            emergencies. The signs and symptoms of potential                            delayed complications were discussed with the                            patient. Return to normal activities  tomorrow.                            Written discharge instructions were provided to the                            patient.                           - Resume previous diet.                           - Continue present medications.                           - Await pathology results.                           - Repeat colonoscopy in 7 years for surveillance.                           - Return to GI clinic in 3 months. Procedure Code(s):        --- Professional ---                           [phone], Colonoscopy, flexible; with removal of                            tumor(s), polyp(s), or other lesion(s) by snare                             technique Diagnosis Code(s):        --- Professional ---                           Z12.11, Encounter for screening for malignant                            neoplasm of colon                           D12.3, Benign neoplasm of transverse colon (hepatic                            flexure or splenic flexure)                           D12.2, Benign neoplasm of ascending colon                           K64.8, Other hemorrhoids CPT copyright 2022 American Medical Association. All rights reserved. The codes documented in this report are preliminary and upon coder review may  be revised to meet current compliance requirements. Elon Alas. Abbey Chatters, DO [place] Abbey Chatters, DO 12/15/2022 10:53:48 AM This report has been signed electronically. Number of Addenda: 0

## 2022-12-15 NOTE — Interval H&P Note (Signed)
History and Physical Interval Note:  12/15/2022 10:11 AM  Kirk Oconnor  has presented today for surgery, with the diagnosis of screening for colon cancer,Chronic GERD, cirrhosis.  The various methods of treatment have been discussed with the patient and family. After consideration of risks, benefits and other options for treatment, the patient has consented to  Procedure(s) with comments: COLONOSCOPY WITH PROPOFOL (N/A) - 10:00 am, asa 3 ESOPHAGOGASTRODUODENOSCOPY (EGD) WITH PROPOFOL (N/A) as a surgical intervention.  The patient's history has been reviewed, patient examined, no change in status, stable for surgery.  I have reviewed the patient's chart and labs.  Questions were answered to the patient's satisfaction.     Eloise Harman

## 2022-12-15 NOTE — Anesthesia Preprocedure Evaluation (Signed)
Anesthesia Evaluation  Patient identified by MRN, date of birth, ID band Patient awake    Reviewed: Allergy & Precautions, H&P , NPO status , Patient's Chart, lab work & pertinent test results  Airway Mallampati: II  TM Distance: >3 FB Neck ROM: Full    Dental  (+) Edentulous Upper, Edentulous Lower   Pulmonary sleep apnea and Continuous Positive Airway Pressure Ventilation , former smoker   Pulmonary exam normal breath sounds clear to auscultation       Cardiovascular Exercise Tolerance: Good hypertension, Pt. 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  Rhythm:Regular Rate:Normal     Neuro/Psych  PSYCHIATRIC DISORDERS Anxiety Depression     Neuromuscular disease    GI/Hepatic ,GERD  Medicated and Controlled,,(+) Cirrhosis       , Hepatitis -, C  Endo/Other  diabetes, Well Controlled, Type 2, Oral Hypoglycemic Agents    Renal/GU negative Renal ROS  negative genitourinary   Musculoskeletal  (+) Arthritis , Osteoarthritis,    Abdominal   Peds negative pediatric ROS (+)  Hematology negative hematology ROS (+)   Anesthesia Other Findings dizziness  Reproductive/Obstetrics negative OB ROS                             Anesthesia Physical Anesthesia Plan  ASA: 3  Anesthesia Plan: General   Post-op Pain Management: Minimal or no pain anticipated   Induction: Intravenous  PONV Risk Score and Plan: 1 and Propofol infusion  Airway Management Planned: Nasal Cannula and Natural Airway  Additional Equipment:   Intra-op Plan:   Post-operative Plan:   Informed Consent: I have reviewed the  patients History and Physical, chart, labs and discussed the procedure including the risks, benefits and alternatives for the proposed anesthesia with the patient or authorized representative who has indicated his/her understanding and acceptance.       Plan Discussed with: CRNA and Surgeon  Anesthesia Plan Comments:         Anesthesia Quick Evaluation

## 2022-12-19 LAB — SURGICAL PATHOLOGY

## 2022-12-27 ENCOUNTER — Encounter (HOSPITAL_COMMUNITY): Payer: Self-pay | Admitting: Internal Medicine

## 2022-12-28 ENCOUNTER — Encounter: Payer: Self-pay | Admitting: Internal Medicine

## 2022-12-28 ENCOUNTER — Encounter: Payer: Self-pay | Admitting: Gastroenterology

## 2023-01-05 ENCOUNTER — Telehealth: Payer: Self-pay | Admitting: *Deleted

## 2023-01-05 ENCOUNTER — Other Ambulatory Visit: Payer: Self-pay | Admitting: *Deleted

## 2023-01-05 ENCOUNTER — Other Ambulatory Visit (HOSPITAL_COMMUNITY)
Admission: RE | Admit: 2023-01-05 | Discharge: 2023-01-05 | Disposition: A | Discharge status: Home / Self Care | Payer: Medicaid Other | Source: Ambulatory Visit | Attending: Internal Medicine | Admitting: Internal Medicine

## 2023-01-05 ENCOUNTER — Other Ambulatory Visit (HOSPITAL_COMMUNITY): Payer: Self-pay | Admitting: Gerontology

## 2023-01-05 ENCOUNTER — Ambulatory Visit (HOSPITAL_COMMUNITY)
Admission: RE | Admit: 2023-01-05 | Discharge: 2023-01-05 | Disposition: A | Discharge status: Home / Self Care | Payer: Medicaid Other | Source: Ambulatory Visit | Attending: Gerontology | Admitting: Gerontology

## 2023-01-05 DIAGNOSIS — E118 Type 2 diabetes mellitus with unspecified complications: Secondary | ICD-10-CM | POA: Insufficient documentation

## 2023-01-05 DIAGNOSIS — R634 Abnormal weight loss: Secondary | ICD-10-CM | POA: Insufficient documentation

## 2023-01-05 DIAGNOSIS — K7460 Unspecified cirrhosis of liver: Secondary | ICD-10-CM

## 2023-01-05 DIAGNOSIS — I10 Essential (primary) hypertension: Secondary | ICD-10-CM | POA: Insufficient documentation

## 2023-01-05 LAB — CBC WITH DIFFERENTIAL/PLATELET
Abs Immature Granulocytes: 0.01 10*3/uL (ref 0.00–0.07)
Basophils Absolute: 0 10*3/uL (ref 0.0–0.1)
Basophils Relative: 1 %
Eosinophils Absolute: 0.1 10*3/uL (ref 0.0–0.5)
Eosinophils Relative: 2 %
HCT: 39.4 % (ref 39.0–52.0)
Hemoglobin: 13.2 g/dL (ref 13.0–17.0)
Immature Granulocytes: 0 %
Lymphocytes Relative: 33 %
Lymphs Abs: 2.3 10*3/uL (ref 0.7–4.0)
MCH: 29.2 pg (ref 26.0–34.0)
MCHC: 33.5 g/dL (ref 30.0–36.0)
MCV: 87.2 fL (ref 80.0–100.0)
Monocytes Absolute: 0.7 10*3/uL (ref 0.1–1.0)
Monocytes Relative: 11 %
Neutro Abs: 3.7 10*3/uL (ref 1.7–7.7)
Neutrophils Relative %: 53 %
Platelets: 152 10*3/uL (ref 150–400)
RBC: 4.52 MIL/uL (ref 4.22–5.81)
RDW: 14.5 % (ref 11.5–15.5)
WBC: 6.9 10*3/uL (ref 4.0–10.5)
nRBC: 0 % (ref 0.0–0.2)

## 2023-01-05 LAB — LIPID PANEL
Cholesterol: 149 mg/dL (ref 0–200)
HDL: 31 mg/dL — ABNORMAL LOW (ref >40)
LDL Cholesterol: 100 mg/dL — ABNORMAL HIGH (ref 0–99)
Total CHOL/HDL Ratio: 4.8 RATIO
Triglycerides: 92 mg/dL (ref <150)
VLDL: 18 mg/dL (ref 0–40)

## 2023-01-05 LAB — BASIC METABOLIC PANEL
Anion gap: 7 (ref 5–15)
BUN: 8 mg/dL (ref 8–23)
CO2: 22 mmol/L (ref 22–32)
Calcium: 9.2 mg/dL (ref 8.9–10.3)
Chloride: 107 mmol/L (ref 98–111)
Creatinine, Ser: 0.7 mg/dL (ref 0.61–1.24)
GFR, Estimated: >60 mL/min (ref >60)
Glucose, Bld: 113 mg/dL — ABNORMAL HIGH (ref 70–99)
Potassium: 4 mmol/L (ref 3.5–5.1)
Sodium: 136 mmol/L (ref 135–145)

## 2023-01-05 LAB — HEPATIC FUNCTION PANEL
ALT: 28 U/L (ref 0–44)
AST: 24 U/L (ref 15–41)
Albumin: 3.5 g/dL (ref 3.5–5.0)
Alkaline Phosphatase: 75 U/L (ref 38–126)
Bilirubin, Direct: 0.1 mg/dL (ref 0.0–0.2)
Indirect Bilirubin: 0.6 mg/dL (ref 0.3–0.9)
Total Bilirubin: 0.7 mg/dL (ref 0.3–1.2)
Total Protein: 7.4 g/dL (ref 6.5–8.1)

## 2023-01-05 LAB — HIV ANTIBODY (ROUTINE TESTING W REFLEX): HIV Screen 4th Generation wRfx: NONREACTIVE

## 2023-01-05 NOTE — Telephone Encounter (Signed)
Pt came by office with letter stating it was time for him to schedule his RUQ [place].  Pt has been scheduled for 01/19/23, arrive at 9:15 am, NPO after midnight.

## 2023-01-13 ENCOUNTER — Emergency Department (HOSPITAL_COMMUNITY): Payer: Medicaid Other

## 2023-01-13 ENCOUNTER — Emergency Department (HOSPITAL_COMMUNITY)
Admission: EM | Admit: 2023-01-13 | Discharge: 2023-01-13 | Disposition: A | Discharge status: Home / Self Care | Payer: Medicaid Other | Attending: Emergency Medicine | Admitting: Emergency Medicine

## 2023-01-13 ENCOUNTER — Encounter (HOSPITAL_COMMUNITY): Payer: Self-pay

## 2023-01-13 ENCOUNTER — Other Ambulatory Visit: Payer: Self-pay

## 2023-01-13 DIAGNOSIS — I509 Heart failure, unspecified: Secondary | ICD-10-CM | POA: Insufficient documentation

## 2023-01-13 DIAGNOSIS — Z79899 Other long term (current) drug therapy: Secondary | ICD-10-CM | POA: Diagnosis not present

## 2023-01-13 DIAGNOSIS — I110 Hypertensive heart disease with heart failure: Secondary | ICD-10-CM | POA: Diagnosis not present

## 2023-01-13 DIAGNOSIS — Z7982 Long term (current) use of aspirin: Secondary | ICD-10-CM | POA: Insufficient documentation

## 2023-01-13 DIAGNOSIS — I10 Essential (primary) hypertension: Secondary | ICD-10-CM

## 2023-01-13 DIAGNOSIS — R42 Dizziness and giddiness: Secondary | ICD-10-CM

## 2023-01-13 LAB — COMPREHENSIVE METABOLIC PANEL
ALT: 22 U/L (ref 0–44)
AST: 20 U/L (ref 15–41)
Albumin: 3.5 g/dL (ref 3.5–5.0)
Alkaline Phosphatase: 70 U/L (ref 38–126)
Anion gap: 8 (ref 5–15)
BUN: 13 mg/dL (ref 8–23)
CO2: 25 mmol/L (ref 22–32)
Calcium: 9.7 mg/dL (ref 8.9–10.3)
Chloride: 103 mmol/L (ref 98–111)
Creatinine, Ser: 1 mg/dL (ref 0.61–1.24)
GFR, Estimated: >60 mL/min (ref >60)
Glucose, Bld: 157 mg/dL — ABNORMAL HIGH (ref 70–99)
Potassium: 3.8 mmol/L (ref 3.5–5.1)
Sodium: 136 mmol/L (ref 135–145)
Total Bilirubin: 1 mg/dL (ref 0.3–1.2)
Total Protein: 7.6 g/dL (ref 6.5–8.1)

## 2023-01-13 LAB — URINALYSIS, ROUTINE W REFLEX MICROSCOPIC
Bilirubin Urine: NEGATIVE
Glucose, UA: NEGATIVE mg/dL
Hgb urine dipstick: NEGATIVE
Ketones, ur: NEGATIVE mg/dL
Leukocytes,Ua: NEGATIVE
Nitrite: NEGATIVE
Protein, ur: NEGATIVE mg/dL
Specific Gravity, Urine: 1.021 (ref 1.005–1.030)
pH: 6 (ref 5.0–8.0)

## 2023-01-13 LAB — CBC
HCT: 41.2 % (ref 39.0–52.0)
Hemoglobin: 13.5 g/dL (ref 13.0–17.0)
MCH: 28.2 pg (ref 26.0–34.0)
MCHC: 32.8 g/dL (ref 30.0–36.0)
MCV: 86 fL (ref 80.0–100.0)
Platelets: 165 10*3/uL (ref 150–400)
RBC: 4.79 MIL/uL (ref 4.22–5.81)
RDW: 13.8 % (ref 11.5–15.5)
WBC: 5.7 10*3/uL (ref 4.0–10.5)
nRBC: 0 % (ref 0.0–0.2)

## 2023-01-13 LAB — CBG MONITORING, ED: Glucose-Capillary: 145 mg/dL — ABNORMAL HIGH (ref 70–99)

## 2023-01-13 MED ORDER — LORAZEPAM 0.5 MG PO TABS
0.5000 mg | ORAL_TABLET | Freq: Once | ORAL | Status: AC
  Administered 2023-01-13: 0.5 mg via ORAL
  Filled 2023-01-13: qty 1

## 2023-01-13 MED ORDER — MECLIZINE HCL 25 MG PO TABS: 25.0000 mg | ORAL_TABLET | Freq: Three times a day (TID) | ORAL | 0 refills | Status: DC | PRN

## 2023-01-13 MED ORDER — MECLIZINE HCL 25 MG PO TABS
25.0000 mg | ORAL_TABLET | Freq: Once | ORAL | Status: AC
  Administered 2023-01-13: 25 mg via ORAL
  Filled 2023-01-13: qty 1

## 2023-01-13 MED ORDER — AMLODIPINE BESYLATE 10 MG PO TABS: 10.0000 mg | ORAL_TABLET | Freq: Every day | ORAL | 0 refills | Status: DC

## 2023-01-13 MED ORDER — SODIUM CHLORIDE 0.9 % IV BOLUS
500.0000 mL | Freq: Once | INTRAVENOUS | Status: AC
  Administered 2023-01-13: 500 mL via INTRAVENOUS

## 2023-01-13 NOTE — ED Provider Notes (Signed)
[place] EMERGENCY DEPARTMENT AT [facility] Provider Note   CSN: [id number] Arrival date & time: 01/13/23  1156     History  Chief Complaint  Patient presents with   Dizziness    Kirk Oconnor is a 67 y.o. male.  Patient with history of hypertension, CHF, hep C, GERD presents today with complaints of dizziness. He states that same began 6 months ago and has been unchanged since then. He was seen by someone and was told he likely had vertigo and was given meclizine with improvement, however he ran out of this some time ago. He states that his symptoms are triggered by head movements and standing up too quickly. He describes his symptoms as lightheadedness and 'feeling like Im going to pass out,' denies any room spinning sensation. He denies headaches, weakness, vision changes, or numbness/tingling. He states that nothing has changed in his condition since onset. Also states that he had diarrhea for about a week that went away 4 days ago. He also notes 'sweating from the waist down.' He states that he had a colonoscopy about a month ago and this has been ongoing since then. The sweating is constant and isolated to his waist and legs. He denies any fevers, chills, saddle paraesthesias, or loss of bowel or bladder function. Denies any back pain that is worse from his baseline. No abdominal pain, nausea, vomiting, hematochezia, melena, or urinary symptoms.   The history is provided by the patient. No language interpreter was used.  Dizziness      Home Medications Prior to Admission medications   Medication Sig Start Date End Date Taking? Authorizing Provider  amLODipine (NORVASC) 5 MG tablet Take 5 mg by mouth daily. 07/20/22   [provider]  aspirin EC 81 MG tablet Take 81 mg by mouth daily. 04/25/17   [provider]  gabapentin (NEURONTIN) 100 MG capsule Take 100 mg by mouth 2 (two) times daily. 11/22/22   [provider]  omeprazole (PRILOSEC)  40 MG capsule Take 1 capsule (40 mg total) by mouth 2 (two) times daily. 12/15/22 12/15/23  Lanelle Bal, DO  sildenafil (VIAGRA) 50 MG tablet Take 50 mg by mouth daily as needed for erectile dysfunction.    [provider]      Allergies    Patient has no known allergies.    Review of Systems   Review of Systems  Neurological:  Positive for dizziness.  All other systems reviewed and are negative.   Physical Exam Updated Vital Signs BP 137/85   Pulse 92   Temp 97.9 F (36.6 C) (Oral)   Resp 18   Ht  (1.702 m)   Wt 74.8 kg   SpO2 98%   BMI 25.84 kg/m  Physical Exam Vitals and nursing note reviewed.  Constitutional:      General: He is not in acute distress.    Appearance: Normal appearance. He is normal weight. He is not ill-appearing, toxic-appearing or diaphoretic.  HENT:     Head: Normocephalic and atraumatic.     Right Ear: Tympanic membrane, ear canal and external ear normal.     Left Ear: Tympanic membrane, ear canal and external ear normal.  Eyes:     Extraocular Movements: Extraocular movements intact.     Pupils: Pupils are equal, round, and reactive to light.  Cardiovascular:     Rate and Rhythm: Normal rate and regular rhythm.     Pulses:  Dorsalis pedis pulses are 2+ on the right side and 2+ on the left side.       Posterior tibial pulses are 2+ on the right side and 2+ on the left side.     Heart sounds: Normal heart sounds.  Pulmonary:     Effort: Pulmonary effort is normal. No respiratory distress.     Breath sounds: Normal breath sounds.  Abdominal:     General: Abdomen is flat.     Palpations: Abdomen is soft.     Tenderness: There is no abdominal tenderness.  Musculoskeletal:        General: No tenderness. Normal range of motion.     Cervical back: Normal range of motion.     Right lower leg: No edema.     Left lower leg: No edema.  Skin:    General: Skin is warm and dry.  Neurological:     General: No focal deficit  present.     Mental Status: He is alert and oriented to person, place, and time.     GCS: GCS eye subscore is 4. GCS verbal subscore is 5. GCS motor subscore is 6.     Sensory: Sensation is intact.     Motor: Motor function is intact.     Coordination: Coordination is intact.     Gait: Gait is intact.     Comments: Alert and oriented to self, place, time and event.    Speech is fluent, clear without dysarthria or dysphasia.    Strength 5/5 in upper/lower extremities   Sensation intact in upper/lower extremities    CN I not tested  CN II grossly intact visual fields bilaterally. Did not visualize posterior eye.  CN III, IV, VI PERRLA and EOMs intact bilaterally, horizontal nystagmus present bilaterally CN V Intact sensation to sharp and light touch to the face  CN VII facial movements symmetric  CN VIII not tested  CN IX, X no uvula deviation, symmetric rise of soft palate  CN XI 5/5 SCM and trapezius strength bilaterally  CN XII Midline tongue protrusion, symmetric L/R movements   Normal finger to nose testing, patient ambulatory with steady gait  Psychiatric:        Mood and Affect: Mood normal.        Behavior: Behavior normal.     ED Results / Procedures / Treatments   Labs (all labs ordered are listed, but only abnormal results are displayed) Labs Reviewed  COMPREHENSIVE METABOLIC PANEL - Abnormal; Notable for the following components:      Result Value   Glucose, Bld 157 (*)    All other components within normal limits  CBG MONITORING, ED - Abnormal; Notable for the following components:   Glucose-Capillary 145 (*)    All other components within normal limits  CBC  URINALYSIS, ROUTINE W REFLEX MICROSCOPIC    EKG EKG Interpretation  Date/Time:  Friday January 13 2023 12:45:12 EDT Ventricular Rate:  84 PR Interval:  151 QRS Duration: 118 QT Interval:  363 QTC Calculation: 430 R Axis:   -69 Text Interpretation: Sinus rhythm Ventricular premature complex  Incomplete RBBB and LAFB Left ventricular hypertrophy Confirmed by Gwyneth Sprout ([id number]) on 01/13/2023 12:48:09 PM  Radiology No results found.  Procedures Procedures    Medications Ordered in ED Medications  sodium chloride 0.9 % bolus 500 mL (0 mLs Intravenous Stopped 01/13/23 1757)  meclizine (ANTIVERT) tablet 25 mg (25 mg Oral Given 01/13/23 1305)  LORazepam (ATIVAN) tablet 0.5 mg (0.5 mg Oral  Given 01/13/23 1605)  LORazepam (ATIVAN) tablet 0.5 mg (0.5 mg Oral Given 01/13/23 1754)    ED Course/ Medical Decision Making/ A&P                             Medical Decision Making Amount and/or Complexity of Data Reviewed Labs: ordered. Radiology: ordered.  Risk Prescription drug management.   This patient is a 67 y.o. male who presents to the ED for concern of dizziness, this involves an extensive number of treatment options, and is a complaint that carries with it a high risk of complications and morbidity. The emergent differential diagnosis prior to evaluation includes, but is not limited to,  BPPV, vestibular migraine, head trauma, AVM, intracranial tumor, multiple sclerosis, drug-related, CVA, vasovagal syncope, orthostatic hypotension, sepsis, hypoglycemia, electrolyte disturbance, anemia, anxiety/panic attack   This is not an exhaustive differential.   Past Medical History / Co-morbidities / Social History: history of hypertension, CHF, hep C, GERD   Additional history: Chart reviewed. Pertinent results include: patient had benign labs on 4/11  Physical Exam: Physical exam performed. The pertinent findings include: Alert and oriented and neurologically intact without focal deficits  Lab Tests: I ordered, and personally interpreted labs.  The pertinent results include: No acute laboratory findings   Imaging Studies: I ordered imaging studies including CT head, MRI brain. I independently visualized and interpreted imaging which showed   CT head:   1. 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 MRI could better assess for acute infarct if warranted. 2. Chronic microvascular ischemic disease.  MRI brain  1. Small focus of hyperintense signal on diffusion weighted imaging in the periatrial white matter on the left, which could represent an acute infarct. Note if there is clinical concern for intracranial metastatic disease, further evaluation with a contrast-enhanced MRI is recommended. 2. Innumerable small foci of microhemorrhage in the bilateral cerebral and cerebellar hemispheres, which are nonspecific but can be seen in the setting of cerebral amyloid angiopathy. 3. Diffusely T1 hypointense appearance of the cervical spine vertebral bodies, nonspecific, but marrow infiltrative process is not excluded. There is likely also severe spinal canal stenosis at C3-C4. Recommend further evaluation with a cervical spine MRI.  I agree with the radiologist interpretation.   Cardiac Monitoring:  The patient was maintained on a cardiac monitor.  My attending physician Dr. Anitra Lauth viewed and interpreted the cardiac monitored which showed an underlying rhythm of: LVH, no STEMI. I agree with this interpretation.   Medications: I ordered medication including fluids, meclizine and ativan  for dehydration, dizziness, MRI. Reevaluation of the patient after these medicines showed that the patient improved. I have reviewed the patients home medicines and have made adjustments as needed.  Consultations Obtained: I requested consultation with the neurology on call Dr. Jerrell Belfast,  and discussed lab and imaging findings as well as pertinent plan - they recommend: reviewed CT and MRI findings, no indication for admission. Recommend better blood pressure control and outpatient neurology follow-up   Disposition:  Patient presents today with complaints of 6 months of unchanged dizziness.  He is afebrile, nontoxic-appearing, and in no acute  distress with reassuring vital signs.  He is also alert and oriented and neurologically intact without focal deficits.  Plan for discharge with better blood pressure control per neurology recommendations based on MRI findings.  Will increase patient's 5 mg of amlodipine to 10 mg of amlodipine daily.  Also given referral to neurology for follow-up  evaluation. Evaluation and diagnostic testing in the emergency department does not suggest an emergent condition requiring admission or immediate intervention beyond what has been performed at this time.  Plan for discharge with close PCP follow-up.  After meclizine and fluids, patient does state he is feeling some better and is able to ambulate without difficulty.  He feels ready to go home.  Patient is understanding and amenable with plan, educated on red flag symptoms that would prompt immediate return.  Patient discharged in stable condition.   Final Clinical Impression(s) / ED Diagnoses Final diagnoses:  Dizziness  Hypertension, unspecified type    Rx / DC Orders ED Discharge Orders          Ordered    amLODipine (NORVASC) 10 MG tablet  Daily        01/13/23 2107    meclizine (ANTIVERT) 25 MG tablet  3 times daily PRN        01/13/23 2111          An After Visit Summary was printed and given to the patient.     Vear Clock 01/13/23 2112    Gwyneth Sprout, MD 01/18/23 0010

## 2023-01-13 NOTE — Discharge Instructions (Addendum)
As we discussed, your workup in the ER today was reassuring for acute findings.  Laboratory evaluation did not reveal any emergent concerns.  We have discussed the findings on your MRI and CT with our neurologist on-call who recommends increasing your blood pressure medication and following up closely with a neurologist outpatient.  I have increased your amlodipine prescription from 5 mg to 10 mg daily.  Please take this medication as prescribed every day.  Please check your blood pressure every day and monitor readings and write them down to bring to your primary care doctor at your next appointment.  Please schedule an appointment with your primary care doctor at your earliest convenience.  Additionally, I have given you a prescription for meclizine since it seems to help with your dizziness and given you instructions to try the Epley maneuvers at home to potentially help with your symptoms as well.   Return if development of any new or worsening symptoms.

## 2023-01-13 NOTE — ED Triage Notes (Signed)
Dizziness and intermittent diarrhea for a little over a week. Concerned about sweating that he has from the waist down, this has been going on for a month. Pt states he saw PCP a week and a half ago. Pt is concerned about having cancer and HIV due to feeling tired. Pt had recent colonoscopy and nothing was found during diagnostic testing.

## 2023-01-13 NOTE — ED Notes (Signed)
Patient transported to MRI 

## 2023-01-17 NOTE — Progress Notes (Signed)
GI Office Note    Referring Provider: Benetta Spar* Primary Care Physician:  Benetta Spar, MD  Primary Gastroenterologist: Hennie Duos. Marletta Lor, DO   Chief Complaint   Chief Complaint  Patient presents with   Follow-up    Having issues with weight loss    History of Present Illness   Kirk Oconnor is a 67 y.o. male presenting today for follow up. Last seen 10/2022. H/o chronic GERD, HCV cirrhosis s/p Epclusa, completed treatment end of January. He will need SVR testing.   Since his last office visit he completed EGD and colonoscopy as outlined below.  His omeprazole was increased to twice daily because of reflux esophagitis.  Patient states since his colonoscopy he has had some odd sensations in his lower body.  From the waist down he describes having intermittent sweats that involve the waist all the way down to his feet.  Can happen during the day or at night.  Denies any loss of sensation or weakness.  No bowel or bladder incontinence.  He has also had significant weight loss since July 2023 of 27 pounds.  He denies any abdominal pain.  States his appetite is good and he is eating well.  Bowel movements are normal.  No blood in the stool or melena.  No heartburn.  States he has come off of his reflux medication, using it only as needed as he does not feel like he has any further heartburn issues.  He was seen in the ED on April 19 for the symptoms.  CT of the head showed small age-indeterminate right cerebellar infarct new from 2013 study.  MRI brain without contrast showed small focus of hyperintense signal on diffusion-weighted imaging in the periatrial white matter on the left, could represent an acute infarct.  If any clinical concern for intracranial metastatic disease, further contrast-enhanced MRI recommended.  Innumerable small foci of microhemorrhage in the bilateral cerebral and cerebellar hemispheres, which are nonspecific but can be seen in the setting  of cerebral amyloid angiopathy.  Diffusely T1 hypointense appearance of the cervical spine vertebral bodies, nonspecific but marrow infiltrative process is not excluded.  There is likely also severe spinal canal stenosis at C3-C4.  Recommend cervical spine MRI.  Patient has a pending appointment with neurology as an outpatient.  Telephone consult with neurology was obtained while in the ED with no recommendations for admission based on findings.    While in the ED on April 19 his labs showed a glucose of 157.  Patient denies taking diabetes medication on a regular basis, no longer on his list, says he takes it only as needed but never knows when to take it.  His LFTs are normal.  Urinalysis negative.  CBC normal.  Kidney function normal.  Patient labs from April 11 with HIV negative, CBC normal, LFTs normal.  Chest x-ray negative.  From February 2024: A1c 6.6  Patient also complains of nocturia up to 10 times at night.  Denies urinary hesitancy or dysuria.  States he has never seen a urologist.     Wt Readings from Last 8 Encounters:  01/18/23 155 lb 6.4 oz (70.5 kg)  01/13/23 160 lb (72.6 kg)  12/15/22 162 lb 0.6 oz (73.5 kg)  12/13/22 162 lb 0.6 oz (73.5 kg)  11/21/22 162 lb (73.5 kg)  07/20/22 175 lb 3.2 oz (79.5 kg)  04/18/22 182 lb 3.2 oz (82.6 kg)  02/24/22 181 lb 12.8 oz (82.5 kg)  EGD 11/2022: -moderately severe reflux esophagitis s/p bx -gastritis s/p bx  Colonoscopy 11/2022: -nonbleeding internal hemorrhoids -two 4-43mm polyps in transverse colon and ascending colon s/p resection -tubular adenomas -next tcs 7 years  Medications   Current Outpatient Medications  Medication Sig Dispense Refill   amLODipine (NORVASC) 10 MG tablet Take 1 tablet (10 mg total) by mouth daily. 30 tablet 0   aspirin EC 81 MG tablet Take 81 mg by mouth daily.     gabapentin (NEURONTIN) 100 MG capsule Take 100 mg by mouth 2 (two) times daily.     meclizine (ANTIVERT) 25 MG tablet Take 1 tablet  (25 mg total) by mouth 3 (three) times daily as needed for dizziness. 30 tablet 0   omeprazole (PRILOSEC) 40 MG capsule Take 1 capsule (40 mg total) by mouth 2 (two) times daily. 60 capsule 11   sildenafil (VIAGRA) 50 MG tablet Take 50 mg by mouth daily as needed for erectile dysfunction.     No current facility-administered medications for this visit.    Allergies   Allergies as of 01/18/2023   (No Known Allergies)       Review of Systems   General: Negative for anorexia,   fever, chills, fatigue, weakness.  See HPI ENT: Negative for hoarseness, difficulty swallowing , nasal congestion. CV: Negative for chest pain, angina, palpitations, dyspnea on exertion, peripheral edema.  Respiratory: Negative for dyspnea at rest, dyspnea on exertion, cough, sputum, wheezing.  GI: See history of present illness. GU:  Negative for dysuria, hematuria, urinary incontinence, urinary frequency, nocturnal urination.  Endo: Negative for unusual weight change.     Physical Exam   BP 119/81 (BP Location: Right Arm, Patient Position: Sitting, Cuff Size: Normal)   Pulse 76   Temp 97.9 F (36.6 C) (Oral)   Ht 5\' 6"  (1.676 m)   Wt 155 lb 6.4 oz (70.5 kg)   SpO2 97%   BMI 25.08 kg/m    General: Well-nourished, well-developed in no acute distress.  No diaphoresis, patient denies sweating currently Eyes: No icterus. Mouth: Oropharyngeal mucosa moist and pink   Lungs: Clear to auscultation bilaterally.  Heart: Regular rate and rhythm, no murmurs rubs or gallops.  Abdomen: Bowel sounds are normal, nontender, nondistended, no hepatosplenomegaly or masses,  no abdominal bruits or hernia , no rebound or guarding.  Rectal: Not performed Extremities: No lower extremity edema. No clubbing or deformities. Neuro: Alert and oriented x 4   Skin: Warm and dry, no jaundice.   Psych: Alert and cooperative, normal mood and affect.  Labs   Lab Results  Component Value Date   CREATININE 1.00 01/13/2023   BUN  13 01/13/2023   NA 136 01/13/2023   K 3.8 01/13/2023   CL 103 01/13/2023   CO2 25 01/13/2023   Lab Results  Component Value Date   ALT 22 01/13/2023   AST 20 01/13/2023   ALKPHOS 70 01/13/2023   BILITOT 1.0 01/13/2023   Lab Results  Component Value Date   WBC 5.7 01/13/2023   HGB 13.5 01/13/2023   HCT 41.2 01/13/2023   MCV 86.0 01/13/2023   PLT 165 01/13/2023   Lab Results  Component Value Date   INR 1.1 12/13/2022   INR 1.1 09/08/2022   INR 1.2 07/20/2022   No results found for: "AFP"  Imaging Studies   MR BRAIN WO CONTRAST  Result Date: 01/13/2023 CLINICAL DATA:  Stroke suspected EXAM: MRI HEAD WITHOUT CONTRAST TECHNIQUE: Multiplanar, multiecho pulse sequences of the brain and  surrounding structures were obtained without intravenous contrast. COMPARISON:  Same day CT head FINDINGS: Brain: There is a small focus of hyperintense signal on diffusion weighted imaging in the periatrial white matter on the left series 13, image 11), which could represent an acute infarct. Chronic bilateral cerebellar infarcts. There are innumerable small foci of microhemorrhage in the bilateral cerebral and cerebellar hemispheres. No hydrocephalus. No extra-axial fluid collection. Sequela of moderate chronic microvascular ischemic change. Vascular: Normal flow voids. Skull and upper cervical spine: There are multiple T1 hypointense lesions in the cervical spine, nonspecific, but worrisome for a marrow infiltrative process. There is likely also severe spinal canal stenosis at C3-C4. Sinuses/Orbits: No middle ear or mastoid effusion. Mild pansinus mucosal thickening lateral orbits are unremarkable. Other: None. IMPRESSION: 1. Small focus of hyperintense signal on diffusion weighted imaging in the periatrial white matter on the left, which could represent an acute infarct. Note if there is clinical concern for intracranial metastatic disease, further evaluation with a contrast-enhanced MRI is recommended.  2. Innumerable small foci of microhemorrhage in the bilateral cerebral and cerebellar hemispheres, which are nonspecific but can be seen in the setting of cerebral amyloid angiopathy. 3. Diffusely T1 hypointense appearance of the cervical spine vertebral bodies, nonspecific, but marrow infiltrative process is not excluded. There is likely also severe spinal canal stenosis at C3-C4. Recommend further evaluation with a cervical spine MRI. Electronically Signed   By: Lorenza Cambridge M.D.   On: 01/13/2023 19:28   CT Head Wo Contrast  Result Date: 01/13/2023 CLINICAL DATA:  Neuro deficit, persistent/recurrent, CNS neoplasm suspected EXAM: CT HEAD WITHOUT CONTRAST TECHNIQUE: Contiguous axial images were obtained from the base of the skull through the vertex without intravenous contrast. RADIATION DOSE REDUCTION: This exam was performed according to the departmental dose-optimization program which includes automated exposure control, adjustment of the mA and/or kV according to patient size and/or use of iterative reconstruction technique. COMPARISON:  CT head August 03, 2012. 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 Patchy white matter hypodensities, nonspecific but compatible with chronic microvascular ischemic disease. No evidence of acute hemorrhage, mass lesion, midline shift or hydrocephalus. Vascular: No hyperdense vessel identified. Skull: No acute fracture. Sinuses/Orbits: Mild paranasal sinus mucosal thickening. No acute orbital findings. Other: No mastoid effusions. IMPRESSION: 1. 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 MRI could better assess for acute infarct if warranted. 2. Chronic microvascular ischemic disease. Electronically Signed   By: Feliberto Harts M.D.   On: 01/13/2023 14:37   DG Chest 2 View  Result Date: 01/07/2023 CLINICAL DATA:  loss of weight EXAM: CHEST - 2 VIEW COMPARISON:  07/11/2017 FINDINGS: Cardiac silhouette is  unremarkable. No pneumothorax or pleural effusion. The lungs are clear. The visualized skeletal structures are unremarkable. Aorta is tortuous. There are median sternotomy wires and prosthetic aortic valve. IMPRESSION: No acute cardiopulmonary process. Electronically Signed   By: Layla Maw M.D.   On: 01/07/2023 14:27    Assessment   Cirrhosis: with h/o HCV genotype 1a s/p 12 weeks of Epclusa. Likely early stage cirrhosis by elastography. Due for repeat liver imaging, was scheduled for u/s tomorrow but given weight loss we will change to CT imaging. We will check cirrhosis labs and HCV next month. This will be his SVR lab for HCV.   GERD: controlled  Nocturia: could be secondary to poorly controlled diabetes (although A1C on 10/2022 was 6.6) or urinary retention.   Weight loss: concerning. Has normal appetite. Recommend  PLAN   CT Chest/abd/pelvis Follow up with neurology as planned. Referral to urology for nocturia.  F/u pending labs by PCP, including PSA, TSH.  Leanna Battles. Melvyn Neth, MHS, PA-C [facility]  Addendum: TSH<0.005 and free T4 of 2.14 to be addressed by PCP (ordering provider).

## 2023-01-18 ENCOUNTER — Ambulatory Visit (INDEPENDENT_AMBULATORY_CARE_PROVIDER_SITE_OTHER): Payer: Medicaid Other | Admitting: Gastroenterology

## 2023-01-18 ENCOUNTER — Encounter: Payer: Self-pay | Admitting: Gastroenterology

## 2023-01-18 ENCOUNTER — Other Ambulatory Visit: Payer: Self-pay | Admitting: *Deleted

## 2023-01-18 VITALS — BP 119/81 | HR 76 | Temp 97.9°F | Ht 66.0 in | Wt 155.4 lb

## 2023-01-18 DIAGNOSIS — R634 Abnormal weight loss: Secondary | ICD-10-CM | POA: Diagnosis not present

## 2023-01-18 DIAGNOSIS — R351 Nocturia: Secondary | ICD-10-CM | POA: Diagnosis not present

## 2023-01-18 DIAGNOSIS — K7460 Unspecified cirrhosis of liver: Secondary | ICD-10-CM

## 2023-01-18 DIAGNOSIS — K219 Gastro-esophageal reflux disease without esophagitis: Secondary | ICD-10-CM | POA: Diagnosis not present

## 2023-01-18 NOTE — Patient Instructions (Signed)
Complete CT scans to be ordered. Follow up with neurology as planned.  We will cancel the ultrasound for tomorrow as we will not need this since we are doing CT scan.  Referral to urology regarding urinary issues.

## 2023-01-19 ENCOUNTER — Observation Stay (HOSPITAL_COMMUNITY): Payer: Medicaid Other

## 2023-01-20 ENCOUNTER — Encounter: Payer: Self-pay | Admitting: Family Medicine

## 2023-01-20 ENCOUNTER — Ambulatory Visit (INDEPENDENT_AMBULATORY_CARE_PROVIDER_SITE_OTHER): Payer: Medicaid Other | Admitting: Family Medicine

## 2023-01-20 VITALS — BP 127/75 | HR 79 | Resp 16 | Ht 66.5 in | Wt 155.8 lb

## 2023-01-20 DIAGNOSIS — I10 Essential (primary) hypertension: Secondary | ICD-10-CM | POA: Diagnosis not present

## 2023-01-20 DIAGNOSIS — E559 Vitamin D deficiency, unspecified: Secondary | ICD-10-CM | POA: Diagnosis not present

## 2023-01-20 DIAGNOSIS — E119 Type 2 diabetes mellitus without complications: Secondary | ICD-10-CM

## 2023-01-20 DIAGNOSIS — Z1329 Encounter for screening for other suspected endocrine disorder: Secondary | ICD-10-CM

## 2023-01-20 DIAGNOSIS — Z7984 Long term (current) use of oral hypoglycemic drugs: Secondary | ICD-10-CM

## 2023-01-20 DIAGNOSIS — Z125 Encounter for screening for malignant neoplasm of prostate: Secondary | ICD-10-CM

## 2023-01-20 DIAGNOSIS — R351 Nocturia: Secondary | ICD-10-CM | POA: Diagnosis not present

## 2023-01-20 DIAGNOSIS — R634 Abnormal weight loss: Secondary | ICD-10-CM

## 2023-01-20 MED ORDER — TAMSULOSIN HCL 0.4 MG PO CAPS
0.4000 mg | ORAL_CAPSULE | Freq: Every day | ORAL | 3 refills | Status: DC
Start: 2023-01-20 — End: 2023-03-01

## 2023-01-20 NOTE — Progress Notes (Signed)
New Patient Office Visit   Subjective   Patient ID: Kirk Oconnor, male    DOB: 27-Aug-1956  Age: 67 y.o. MRN: [id number]  CC:  Chief Complaint  Patient presents with   Establish Care    Scared something is wrong with his prostate because hasn't had a psa checked and has been losing weight and excessively sweating     HPI Kirk Oconnor 67 year old, presents to establish care. 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     Outpatient Encounter Medications as of 01/20/2023  Medication Sig   amLODipine (NORVASC) 10 MG tablet Take 1 tablet (10 mg total) by mouth daily.   aspirin EC 81 MG tablet Take 81 mg by mouth daily.   gabapentin (NEURONTIN) 100 MG capsule Take 100 mg by mouth 2 (two) times daily.   meclizine (ANTIVERT) 25 MG tablet Take 1 tablet (25 mg total) by mouth 3 (three) times daily as needed for dizziness.   metFORMIN (GLUCOPHAGE) 500 MG tablet Take 500 mg by mouth daily with breakfast.   omeprazole (PRILOSEC) 40 MG capsule Take 1 capsule (40 mg total) by mouth 2 (two) times daily.   sildenafil (VIAGRA) 50 MG tablet Take 50 mg by mouth daily as needed for erectile dysfunction.   tamsulosin (FLOMAX) 0.4 MG CAPS capsule Take 1 capsule (0.4 mg total) by mouth daily.   No facility-administered encounter medications on file as of 01/20/2023.    Past Surgical History:  Procedure Laterality Date   AORTIC VALVE REPLACEMENT  03/2017   [facility]   BIOPSY  12/15/2022   Procedure: BIOPSY;  Surgeon: Lanelle Bal, DO;  Location: [facility];  Service: Endoscopy;;   COLONOSCOPY WITH PROPOFOL N/A 12/15/2022   Procedure:  COLONOSCOPY WITH PROPOFOL;  Surgeon: Lanelle Bal, DO;  Location: [facility];  Service: Endoscopy;  Laterality: N/A;  10:00 am, asa 3   ESOPHAGOGASTRODUODENOSCOPY (EGD) WITH PROPOFOL N/A 12/15/2022   Procedure: ESOPHAGOGASTRODUODENOSCOPY (EGD) WITH PROPOFOL;  Surgeon: Lanelle Bal, DO;  Location: [facility];  Service: Endoscopy;  Laterality: N/A;   POLYPECTOMY  12/15/2022   Procedure: POLYPECTOMY;  Surgeon: Lanelle Bal, DO;  Location: [facility];  Service: Endoscopy;;    Review of Systems  Constitutional:  Positive for diaphoresis and weight loss. Negative for chills and fever.  Respiratory:  Negative for shortness of breath.   Cardiovascular:  Negative for chest pain.  Gastrointestinal:  Negative for abdominal pain.  Genitourinary:  Negative for dysuria.  Musculoskeletal:  Negative for myalgias.  Neurological:  Negative for dizziness and headaches.      Objective    BP 127/75   Pulse 79   Resp 16   Ht 5' 6.5" (1.689 m)   Wt 155 lb 12.8 oz (70.7 kg)   SpO2 96%   BMI 24.77 kg/m   Physical Exam Vitals reviewed.  Constitutional:      General: He is not in acute distress.    Appearance: Normal appearance. He is not ill-appearing, toxic-appearing or diaphoretic.  HENT:     Head: Normocephalic.  Eyes:     General:        Right eye: No discharge.  Left eye: No discharge.     Conjunctiva/sclera: Conjunctivae normal.  Cardiovascular:     Rate and Rhythm: Normal rate.     Pulses: Normal pulses.     Heart sounds: Normal heart sounds.  Pulmonary:     Effort: Pulmonary effort is normal. No respiratory distress.     Breath sounds: Normal breath sounds.  Musculoskeletal:        General: Normal range of motion.     Cervical back: Normal range of motion.  Skin:    General: Skin is warm and dry.     Capillary Refill: Capillary refill takes less than 2 seconds.  Neurological:     General: No focal deficit present.     Mental Status: He is alert and  oriented to person, place, and time.     Coordination: Coordination normal.     Gait: Gait normal.  Psychiatric:        Mood and Affect: Mood normal.        Behavior: Behavior normal.       Assessment & Plan:  Screening for prostate cancer -     PSA  Vitamin D deficiency -     VITAMIN D 25 Hydroxy (Vit-D Deficiency, Fractures)  Screening for thyroid disorder -     TSH + free T4  Essential hypertension, benign Assessment & Plan: Vitals:   01/20/23 1022 01/20/23 1050  BP: 130/88 127/75  Blood pressure controlled in today's visit Continue taking amlodipine 10 mg daily  Discussed follow a DASH diet which includes vegetables,fruits,whole grains, fat free or low fat diary,fish,poultry,beans,nuts and seeds,vegetable oils. Find an activity that you will enjoy and start to be active at least 5 days a week for 30 minutes each day.    Type 2 diabetes mellitus without complication, without long-term current use of insulin (HCC) Assessment & Plan: Hemoglobin A1C 6.6 -on 11/22/22, repeat labs next visit Continue Metformin 500 mg daily  Discussed non pharmacological interventions such as low carb diet,high in protein, vegetables and fruit. Educated on importance of physical activity 150 minutes per week. Discussed signs and symptoms of hypoglycemia and need to present to the ED. Follow up in 3 months or sooner if needed. Patient verbalizes understanding regarding plan of care and all questions answered. Ophthalmology referral placed, Foot exam within desired limits   Orders: -     Ambulatory referral to Ophthalmology  Nocturia Assessment & Plan: PSA ordered Trial on Flomax 0.4 mg daily   Patient following up with urology on 6/7  Orders: -     Tamsulosin HCl; Take 1 capsule (0.4 mg total) by mouth daily.  Dispense: 30 capsule; Refill: 3  Unexplained weight loss Assessment & Plan: PSA, Thyroid panel, and Vit D labs ordered Patient being followed by GI CT of abdomen and pelvis  pending.     Return in about 3 months (around 04/21/2023) for diabetes, hypertension, routine labs, hyperlipidemia/ high cholestrol.   Cruzita Lederer Newman Nip, FNP

## 2023-01-20 NOTE — Assessment & Plan Note (Signed)
PSA ordered Trial on Flomax 0.4 mg daily   Patient following up with urology on 6/7

## 2023-01-20 NOTE — Patient Instructions (Signed)

## 2023-01-20 NOTE — Assessment & Plan Note (Signed)
Vitals:   01/20/23 1022 01/20/23 1050  BP: 130/88 127/75  Blood pressure controlled in today's visit Continue taking amlodipine 10 mg daily  Discussed follow a DASH diet which includes vegetables,fruits,whole grains, fat free or low fat diary,fish,poultry,beans,nuts and seeds,vegetable oils. Find an activity that you will enjoy and start to be active at least 5 days a week for 30 minutes each day.

## 2023-01-20 NOTE — Assessment & Plan Note (Signed)
PSA, Thyroid panel, and Vit D labs ordered Patient being followed by GI CT of abdomen and pelvis pending.

## 2023-01-20 NOTE — Assessment & Plan Note (Signed)
Hemoglobin A1C 6.6 -on 11/22/22, repeat labs next visit Continue Metformin 500 mg daily  Discussed non pharmacological interventions such as low carb diet,high in protein, vegetables and fruit. Educated on importance of physical activity 150 minutes per week. Discussed signs and symptoms of hypoglycemia and need to present to the ED. Follow up in 3 months or sooner if needed. Patient verbalizes understanding regarding plan of care and all questions answered. Ophthalmology referral placed, Foot exam within desired limits

## 2023-01-21 LAB — TSH+FREE T4
Free T4: 2.14 ng/dL — ABNORMAL HIGH (ref 0.82–1.77)
TSH: <0.005 u[IU]/mL — ABNORMAL LOW (ref 0.450–4.500)

## 2023-01-21 LAB — PSA: Prostate Specific Ag, Serum: 1.4 ng/mL (ref 0.0–4.0)

## 2023-01-21 LAB — VITAMIN D 25 HYDROXY (VIT D DEFICIENCY, FRACTURES): Vit D, 25-Hydroxy: 24.6 ng/mL — ABNORMAL LOW (ref 30.0–100.0)

## 2023-01-23 ENCOUNTER — Ambulatory Visit (HOSPITAL_COMMUNITY)
Admission: RE | Admit: 2023-01-23 | Discharge: 2023-01-23 | Disposition: A | Discharge status: Home / Self Care | Payer: Medicaid Other | Source: Ambulatory Visit | Attending: Gastroenterology | Admitting: Gastroenterology

## 2023-01-23 DIAGNOSIS — K219 Gastro-esophageal reflux disease without esophagitis: Secondary | ICD-10-CM | POA: Diagnosis present

## 2023-01-23 DIAGNOSIS — K7460 Unspecified cirrhosis of liver: Secondary | ICD-10-CM | POA: Insufficient documentation

## 2023-01-23 DIAGNOSIS — R634 Abnormal weight loss: Secondary | ICD-10-CM | POA: Diagnosis present

## 2023-01-23 DIAGNOSIS — R351 Nocturia: Secondary | ICD-10-CM | POA: Diagnosis present

## 2023-01-23 MED ORDER — IOHEXOL 300 MG/ML  SOLN
100.0000 mL | Freq: Once | INTRAMUSCULAR | Status: AC | PRN
  Administered 2023-01-23: 100 mL via INTRAVENOUS

## 2023-01-25 ENCOUNTER — Other Ambulatory Visit: Payer: Self-pay | Admitting: Family Medicine

## 2023-01-25 ENCOUNTER — Telehealth: Payer: Self-pay | Admitting: Cardiology

## 2023-01-25 ENCOUNTER — Telehealth: Payer: Self-pay | Admitting: Family Medicine

## 2023-01-25 DIAGNOSIS — Z1329 Encounter for screening for other suspected endocrine disorder: Secondary | ICD-10-CM

## 2023-01-25 NOTE — Telephone Encounter (Signed)
Patient aware.

## 2023-01-25 NOTE — Telephone Encounter (Signed)
Patient's sister is calling needing to know what kind of heart valve the patient had placed. Please advise.

## 2023-01-25 NOTE — Telephone Encounter (Signed)
Advised that per chart has status post bioprosthetic AVR  Also sent link to sign up for mychart Verbalized understanding

## 2023-01-25 NOTE — Progress Notes (Signed)
Please inform patient  Vitamin D levels low, I advise to taking  over the counter supplements of vitamin D [phone] IU/day to prevent low vitamin D levels. Consuming Vitamin D rich food sources include fish, salmon, sardines, egg yolks, red meat, liver, oranges, soy milk.   Thyroid panel abnormal- We need to repeat thyroid panel in 4 weeks Follow up for labs ONLY in 4 weeks from now.  PSA results normal

## 2023-01-25 NOTE — Telephone Encounter (Signed)
Patient came by office to go over lab results, patient left asked for nurse to give him a call at [phone].

## 2023-01-27 ENCOUNTER — Telehealth: Payer: Self-pay | Admitting: Family Medicine

## 2023-01-27 NOTE — Telephone Encounter (Signed)
Pt called in regards to labs results.

## 2023-01-27 NOTE — Telephone Encounter (Signed)
Spoke to patient

## 2023-01-30 ENCOUNTER — Other Ambulatory Visit: Payer: Self-pay | Admitting: Family Medicine

## 2023-01-30 DIAGNOSIS — Z1329 Encounter for screening for other suspected endocrine disorder: Secondary | ICD-10-CM

## 2023-01-30 NOTE — Progress Notes (Signed)
Rule out hyperthyroid , Graves Disease

## 2023-01-31 LAB — TSH+FREE T4
Free T4: 1.76 ng/dL (ref 0.82–1.77)
TSH: <0.005 u[IU]/mL — ABNORMAL LOW (ref 0.450–4.500)

## 2023-02-01 ENCOUNTER — Telehealth: Payer: Self-pay | Admitting: Family Medicine

## 2023-02-01 ENCOUNTER — Other Ambulatory Visit: Payer: Self-pay | Admitting: Family Medicine

## 2023-02-01 DIAGNOSIS — E0590 Thyrotoxicosis, unspecified without thyrotoxic crisis or storm: Secondary | ICD-10-CM

## 2023-02-01 LAB — SPECIMEN STATUS REPORT

## 2023-02-01 LAB — T3, FREE: T3, Free: 4.2 pg/mL (ref 2.0–4.4)

## 2023-02-01 LAB — THYROID PEROXIDASE ANTIBODIES (TPO) (REFL): Thyroperoxidase Ab SerPl-aCnc: 2 IU/mL (ref <9)

## 2023-02-01 LAB — THYROID STIMULATING IMMUNOGLOBULIN: Thyroid Stim Immunoglobulin: 0.85 IU/L — ABNORMAL HIGH (ref 0.00–0.55)

## 2023-02-01 MED ORDER — METHIMAZOLE 10 MG PO TABS: 10.0000 mg | ORAL_TABLET | Freq: Two times a day (BID) | ORAL | 1 refills | Status: DC

## 2023-02-01 NOTE — Progress Notes (Signed)
Please inform patient,  Started patient on methimazole 10 mg twice daily, Medication sent to pharmacy. Thyroid panel consistent for hyperthyroid, Referral placed to endocrinology for further evaluation

## 2023-02-01 NOTE — Telephone Encounter (Signed)
Per patient please call Patient at [phone] does not need to route his information to his sister. Calling about his lab results.

## 2023-02-01 NOTE — Telephone Encounter (Signed)
Pt called in regards to lab results. He would like a call back when they're available please.

## 2023-02-01 NOTE — Telephone Encounter (Signed)
Spoke with patient's sister.

## 2023-02-01 NOTE — Progress Notes (Signed)
Please inform patient,   Started patient on methimazole 10 mg twice daily, Thyroid panel consistent for hyperthyroid, Referral placed to endocrinology for further evaluation

## 2023-02-01 NOTE — Telephone Encounter (Signed)
Started patient on methimazole 10 mg twice daily, Thyroid panel consistent for hyperthyroid, with patient reported symptoms of night sweats and weight loss. Referral placed to endocrinology for further evaluation

## 2023-02-02 NOTE — Telephone Encounter (Signed)
Spoke with patient. Advised him that he needs to follow up with endocrinology and take the medicine as prescribed by [employer] unless endo says otherwise.

## 2023-02-06 ENCOUNTER — Other Ambulatory Visit: Payer: Self-pay

## 2023-02-06 DIAGNOSIS — K7460 Unspecified cirrhosis of liver: Secondary | ICD-10-CM

## 2023-02-06 DIAGNOSIS — B1920 Unspecified viral hepatitis C without hepatic coma: Secondary | ICD-10-CM

## 2023-02-07 ENCOUNTER — Telehealth: Payer: Self-pay | Admitting: Family Medicine

## 2023-02-07 NOTE — Telephone Encounter (Signed)
Pt called in regard to  Med is not helping patient.  Wants a call back in regard.  2nd time pt has reached out to office

## 2023-02-07 NOTE — Telephone Encounter (Signed)
Patient called and needs for nurse to return call to clarify medication currently taking for thyroid medication.  [phone]

## 2023-02-08 NOTE — Telephone Encounter (Signed)
Pt calling again see previous encounter below

## 2023-02-08 NOTE — Telephone Encounter (Signed)
Still sweating really bad and the thyroid med doesn't seem to be helping the sweats at all. Pls advise what he needs to do

## 2023-02-08 NOTE — Telephone Encounter (Signed)
Pt concerned med not helping sweats. See previous msg

## 2023-02-09 NOTE — Telephone Encounter (Signed)
Spoke to patient

## 2023-02-09 NOTE — Telephone Encounter (Signed)
Pt called back again in regard to previous messages. Has not heard back from pcp in regard. Wants a cll back Today.

## 2023-02-13 ENCOUNTER — Telehealth: Payer: Self-pay | Admitting: Family Medicine

## 2023-02-13 NOTE — Telephone Encounter (Signed)
Patient called very dizzy feels like he is going to pass out. can nurse speak to him?, urgent teams message sent

## 2023-02-22 ENCOUNTER — Telehealth: Payer: Self-pay

## 2023-02-22 NOTE — Telephone Encounter (Signed)
Hepatitis C form from [employer] regarding medication therapy to be filled out and returned back to me in your box

## 2023-02-23 ENCOUNTER — Other Ambulatory Visit (HOSPITAL_COMMUNITY)
Admission: RE | Admit: 2023-02-23 | Discharge: 2023-02-23 | Disposition: A | Discharge status: Home / Self Care | Payer: Medicaid Other | Source: Ambulatory Visit | Attending: Gastroenterology | Admitting: Gastroenterology

## 2023-02-23 DIAGNOSIS — K7460 Unspecified cirrhosis of liver: Secondary | ICD-10-CM | POA: Insufficient documentation

## 2023-02-23 DIAGNOSIS — B1920 Unspecified viral hepatitis C without hepatic coma: Secondary | ICD-10-CM | POA: Diagnosis not present

## 2023-02-23 LAB — COMPREHENSIVE METABOLIC PANEL
ALT: 22 U/L (ref 0–44)
AST: 19 U/L (ref 15–41)
Albumin: 3.5 g/dL (ref 3.5–5.0)
Alkaline Phosphatase: 73 U/L (ref 38–126)
Anion gap: 8 (ref 5–15)
BUN: 9 mg/dL (ref 8–23)
CO2: 23 mmol/L (ref 22–32)
Calcium: 9.3 mg/dL (ref 8.9–10.3)
Chloride: 97 mmol/L — ABNORMAL LOW (ref 98–111)
Creatinine, Ser: 0.81 mg/dL (ref 0.61–1.24)
GFR, Estimated: >60 mL/min (ref >60)
Glucose, Bld: 121 mg/dL — ABNORMAL HIGH (ref 70–99)
Potassium: 3.6 mmol/L (ref 3.5–5.1)
Sodium: 128 mmol/L — ABNORMAL LOW (ref 135–145)
Total Bilirubin: 0.9 mg/dL (ref 0.3–1.2)
Total Protein: 7.3 g/dL (ref 6.5–8.1)

## 2023-02-23 LAB — CBC WITH DIFFERENTIAL/PLATELET
Abs Immature Granulocytes: 0.01 10*3/uL (ref 0.00–0.07)
Basophils Absolute: 0 10*3/uL (ref 0.0–0.1)
Basophils Relative: 0 %
Eosinophils Absolute: 0.2 10*3/uL (ref 0.0–0.5)
Eosinophils Relative: 2 %
HCT: 40.1 % (ref 39.0–52.0)
Hemoglobin: 13.8 g/dL (ref 13.0–17.0)
Immature Granulocytes: 0 %
Lymphocytes Relative: 42 %
Lymphs Abs: 2.9 10*3/uL (ref 0.7–4.0)
MCH: 29.2 pg (ref 26.0–34.0)
MCHC: 34.4 g/dL (ref 30.0–36.0)
MCV: 84.8 fL (ref 80.0–100.0)
Monocytes Absolute: 0.5 10*3/uL (ref 0.1–1.0)
Monocytes Relative: 8 %
Neutro Abs: 3.2 10*3/uL (ref 1.7–7.7)
Neutrophils Relative %: 48 %
Platelets: 148 10*3/uL — ABNORMAL LOW (ref 150–400)
RBC: 4.73 MIL/uL (ref 4.22–5.81)
RDW: 15.3 % (ref 11.5–15.5)
WBC: 6.8 10*3/uL (ref 4.0–10.5)
nRBC: 0 % (ref 0.0–0.2)

## 2023-02-23 LAB — PROTIME-INR
INR: 1.1 (ref 0.8–1.2)
Prothrombin Time: 14.5 seconds (ref 11.4–15.2)

## 2023-02-23 NOTE — Telephone Encounter (Signed)
To be completed once his labs have returned.

## 2023-02-24 LAB — HCV RNA QUANT: HCV Quantitative: NOT DETECTED IU/mL (ref >50)

## 2023-02-24 NOTE — Telephone Encounter (Signed)
noted 

## 2023-02-25 LAB — AFP TUMOR MARKER: AFP, Serum, Tumor Marker: 4.4 ng/mL (ref 0.0–8.4)

## 2023-02-27 ENCOUNTER — Telehealth: Payer: Self-pay | Admitting: Family Medicine

## 2023-02-27 ENCOUNTER — Other Ambulatory Visit: Payer: Self-pay

## 2023-02-27 DIAGNOSIS — K7460 Unspecified cirrhosis of liver: Secondary | ICD-10-CM

## 2023-02-27 NOTE — Telephone Encounter (Signed)
Pt called in for lab results. Wants a call back today.

## 2023-02-27 NOTE — Telephone Encounter (Signed)
Spoke with patient's sister. We did not order labs, they were from another office. Patient has already spoken with that office about them.

## 2023-02-28 DIAGNOSIS — N529 Male erectile dysfunction, unspecified: Secondary | ICD-10-CM | POA: Insufficient documentation

## 2023-02-28 NOTE — Progress Notes (Unsigned)
History of Present Illness: Kirk Oconnor is a 67 y.o. male who presents today for follow up visit at [facility]. - GU history: 1. BPH with LUTS (nocturia).  2. Erectile dysfunction.   At last visit with Dr. Ronne Binning on 08/10/2022: - Patient reported inadequate response to Sildenafil or Tadalafil. - The plan was to try intracorporeal injections. Alprostadil 20 mcg sent to pharmacy with plan to follow up in 1 week for injection teaching.  Since last visit: > 01/20/2023: Seen by new PCP to establish care. Reported nocturia and unexplained weight loss. PSA was normal (1.4). Started on Flomax 0.4 mg daily.  > 01/23/2023: CT chest/abdomen/pelvis w/ contrast showed: - No enhancing renal mass. Tiny low-attenuation lesion in the lateral aspect of the left mid kidney under a cm. Too small to completely characterize but likely a small cyst. Bosniak 2 lesion. No specific imaging follow-up.  - Preserved contours of the urinary bladder. - Prostate is unremarkable.   > 02/23/2023: CMP showed: - Creatinine normal: 0.81 - GFR normal: >60 - Sodium low: 128. GI provider L. Lewis, PA advised "Make sure he is not drinking excessive amounts of water. Limit fluid intake to 1.5 liters per day." Patient was also encouraged to keep neurology appointment as scheduled on 03/03/2023 for further evaluation of unexplained dizziness.  Today: He reports persistent nocturia, which has become increasingly bothersome. He tried Flomax and states it helped a little but not enough to be worth continuing. He reports voiding 3-4x/day and 10x/night. He denies urinary hesitancy, urgency, frequency, dysuria, gross hematuria, straining to void, or sensations of incomplete emptying.   He denies wearing CPAP routinely for obstructive sleep apnea because when he was incarcerated it was taken from him. He reports fluid intake (water) within 3 hours prior to bedtime. He reports fluid intake (water) throughout the  night.  He denies caffeine intake within 8 hours prior to bedtime. He denies routinely experiencing lower extremity edema during the day.  He reports that he was unable to afford the Alprostadil for intracorporeal injection therapy which is why he did not follow up for test dose appointment. He continues to be significantly bothered by his erectile dysfunction, which is negatively impacting his mental health. Pt states he is not able to get an erection strong enough for penetrative intercourse.   During the course of today's visit he shared with me multiple personal struggles he is experiencing which include but are not limited to: housing instability, socioeconomic hardship, feels like nobody listens to him, having a hard time getting a definitive diagnosis for some of his non-urologic concerns (dizziness, weight loss, etc.). He reports that he has at times had thoughts about suicide because he felt hopeless.   Fall Screening: Do you usually have a device to assist in your mobility? No   Medications: Current Outpatient Medications  Medication Sig Dispense Refill   amLODipine (NORVASC) 10 MG tablet Take 1 tablet (10 mg total) by mouth daily. 30 tablet 0   aspirin EC 81 MG tablet Take 81 mg by mouth daily.     gabapentin (NEURONTIN) 100 MG capsule Take 100 mg by mouth 2 (two) times daily.     meclizine (ANTIVERT) 25 MG tablet Take 1 tablet (25 mg total) by mouth 3 (three) times daily as needed for dizziness. 30 tablet 0   metFORMIN (GLUCOPHAGE) 500 MG tablet Take 500 mg by mouth daily with breakfast.     methimazole (TAPAZOLE) 10 MG tablet Take 1 tablet (10 mg  total) by mouth 2 (two) times daily. 60 tablet 1   omeprazole (PRILOSEC) 40 MG capsule Take 1 capsule (40 mg total) by mouth 2 (two) times daily. 60 capsule 11   sildenafil (VIAGRA) 100 MG tablet Take 1 tablet (100 mg total) by mouth daily as needed for erectile dysfunction. 30 tablet 1   No current facility-administered medications for  this visit.    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   Arthritis    CAD (coronary artery disease)    Nonobstructive at cardiac catheterization July 2018 - [facility]   Carpal tunnel syndrome    Depression    Diabetes mellitus without complication (HCC)    Essential hypertension    GERD (gastroesophageal reflux disease)    Hepatitis C    Sleep apnea    Past Surgical History:  Procedure Laterality Date   AORTIC VALVE REPLACEMENT  03/2017   [facility]   BIOPSY  12/15/2022   Procedure: BIOPSY;  Surgeon: Lanelle Bal, DO;  Location: [facility];  Service: Endoscopy;;   COLONOSCOPY WITH PROPOFOL N/A 12/15/2022   Procedure: COLONOSCOPY WITH PROPOFOL;  Surgeon: Lanelle Bal, DO;  Location: [facility];  Service: Endoscopy;  Laterality: N/A;  10:00 am, asa 3   ESOPHAGOGASTRODUODENOSCOPY (EGD) WITH PROPOFOL N/A 12/15/2022   Procedure: ESOPHAGOGASTRODUODENOSCOPY (EGD) WITH PROPOFOL;  Surgeon: Lanelle Bal, DO;  Location: [facility];  Service: Endoscopy;  Laterality: N/A;   POLYPECTOMY  12/15/2022   Procedure: POLYPECTOMY;  Surgeon: Lanelle Bal, DO;  Location: [facility];  Service: Endoscopy;;    Family History  Problem Relation Age of Onset   Diabetes Mother    Heart attack Mother    Heart attack Father    Social History   Socioeconomic History   Marital status: Divorced    Spouse name: Not on file   Number of children: Not on file   Years of education: Not on file   Highest education level: Not on file  Occupational History   Not on file  Tobacco Use   Smoking status: Some Days    Types: Cigarettes   Smokeless tobacco: Never  Vaping Use   Vaping Use: Never  used  Substance and Sexual Activity   Alcohol use: Yes    Comment: socially   Drug use: No   Sexual activity: Yes  Other Topics Concern   Not on file  Social History Narrative   Not on file   Social Determinants of Health   Financial Resource Strain: Not on file  Food Insecurity: Not on file  Transportation Needs: Not on file  Physical Activity: Not on file  Stress: Not on file  Social Connections: Not on file  Intimate Partner Violence: Not on file    Review of Systems Constitutional: Patient reports unintentional weight loss  lntegumentary: Patient denies any rashes or pruritus Eyes: Patient denies dry eyes ENT: Patient denies dry mouth Cardiovascular: Patient denies chest pain or syncope Respiratory: Patient denies shortness of breath Gastrointestinal: Patient denies nausea, vomiting, constipation, or diarrhea Musculoskeletal: Patient denies muscle cramps or weakness Neurologic: Patient denies convulsions or seizures Psychiatric: Patient denies memory problems, reports suicidal thoughts at times Allergic/Immunologic:  Patient denies recent allergic reaction(s) Hematologic/Lymphatic: Patient denies bleeding tendencies Endocrine: Patient denies heat/cold intolerance  GU: As per HPI.  OBJECTIVE Vitals:   03/01/23 1041 03/01/23 1102  BP: (!) 142/81 133/85  Pulse: 71 78   There is no height or weight on file to calculate BMI.  Physical Examination  Constitutional: Patient is non-toxic appearing Cardiovascular: No visible lower extremity edema.  Respiratory: The patient does not have audible wheezing/stridor; respirations do not appear labored  Gastrointestinal: Abdomen non-distended Musculoskeletal: Normal ROM of UEs  Skin: No obvious rashes/open sores  Neurologic: CN 2-12 grossly intact Psychiatric: Answered questions appropriately with pleasant affect, briefly tearful Hematologic/Lymphatic/Immunologic: No obvious bruises or sites of spontaneous bleeding  UA:  negative PVR: 0 ml  ASSESSMENT Nocturia - Plan: Urinalysis, Routine w reflex microscopic, BLADDER SCAN AMB NON-IMAGING, PR COMPLEX UROFLOMETRY  Benign prostatic hyperplasia with nocturia - Plan: Urinalysis, Routine w reflex microscopic, BLADDER SCAN AMB NON-IMAGING, PR COMPLEX UROFLOMETRY  OSA (obstructive sleep apnea) - Plan: Urinalysis, Routine w reflex microscopic, BLADDER SCAN AMB NON-IMAGING, PR COMPLEX UROFLOMETRY  Erectile dysfunction, unspecified erectile dysfunction type - Plan: Urinalysis, Routine w reflex microscopic, BLADDER SCAN AMB NON-IMAGING, PR COMPLEX UROFLOMETRY, sildenafil (VIAGRA) 100 MG tablet  Housing insecurity - Plan: Ambulatory referral to Social Work  Suicidal ideation - Plan: Ambulatory referral to Social Work  History of incarceration - Plan: Ambulatory referral to Social Work  Patient cannot afford treatments - Plan: Ambulatory referral to Social Work  For nocturia: We reviewed possible etiologies for nocturia including but not limited to: caffeine intake, evening fluid intake, sleep apnea, peripheral edema, reduced bladder capacity, renal tubular dysfunction. We discussed likely contribution of his untreated obstructive sleep apnea towards nocturia and/or nocturnal polyuria. He was advised to contact his PCP about obtaining new CPAP or (if necessary) referral to sleep medicine specialty. Will send today's visit note to PCP as well to facilitate that request.  We also reviewed behavioral modifications which can be helpful for nocturia including: Minimizing caffeine intake (especially within 6-8 hours before bedtime). Minimizing fluid intake within 3 hours before bedtime. Minimizing overnight fluid intake.  We discussed his obstacles regarding social determinants of health; patient was validated and provider offered assistance via referral to Social Work along with encouraging patient to contact provider / staff at our office any time he has concerns even if  they are non-urologic in nature. Safety contract: Patient agreed to follow up with provider in 6 months or sooner if needed.   Thorough discussion was had with patientt regarding possible etiologies of erectile dysfunction including psychological, vasculogenic, neurologic, and testosterone deficiency. We reviewed options for therapy including medication, vacuum pumps, penile injections, MUSE, and penile implants. Patient was advised that not all these options are likely to be covered by insurance. Patient elected to proceed with Viagra +/- VED for now. May try Eroxon when that becomes commercially available (hopefully in 2025). We discussed potential side effects, particularly that of priapism. He was advised to seek urgent medical attention if he develops an erection lasting more than 2-4 hours due to the risk for irreversible permanent tissue damage.   Will plan for follow up in 6 months or sooner if needed. Pt verbalized understanding and agreement. All questions were answered.  PLAN Advised the following: 1. Flomax discontinued (per patient). 2. Restart Viagra 100 mg PRN for erectile dysfunction. Can use with or without vacuum erectile device. 3. Obtain & use new CPAP machine for OSA.  4. Referral placed to Social Work.  5.  Minimize caffeine intake. 6. Minimize fluid intake within 3 hours before bedtime. 7. Minimize overnight fluid intake. 8. Return in about 6 months (around 08/31/2023) for UA, PVR, & f/u with Evette Georges NP.   Orders Placed This Encounter  Procedures   Urinalysis, Routine w reflex microscopic   Ambulatory referral to Social Work    Referral Priority:   Routine    Referral Type:   Consultation    Referral Reason:   Specialty Services Required    Number of Visits Requested:   1   PR COMPLEX UROFLOMETRY   BLADDER SCAN AMB NON-IMAGING    It has been explained that the patient is to follow regularly with their PCP in addition to all other providers involved in their  care and to follow instructions provided by these respective offices. Patient advised to contact urology clinic if any urologic-pertaining questions, concerns, new symptoms or problems arise in the interim period.  Patient Instructions  Reminder:  Please contact your primary care provider about how to obtain new CPAP for your obstructive sleep apnea, which is thought to be a primary contributing factor for your nocturnal polyuria / nocturia (excessive nighttime urinary frequency). Her name is [name]; phone # [phone].   ----------------------------------------------------------------------------------------------------------------------------  Regarding Erectile Dysfunction:  Contributing factors for ED: Psychological: Such as performance anxiety linked to previous bad experiences. Vasculogenic: Such as in men w/ HTN, hyperlipidemia, T2DM b/c these can cause decreased elasticity of blood vessels, which results in decreased ability for penile arteries to dilate and for penile veins to compress (resulting in venous leak). Neurologic: Such as in men w/ T2DM, stroke, Parkinson's. Nerve signalling is faulty between brain and penis. Can cause ED, decreased sensation, etc. Hypogonadism: May need testosterone replacement therapy.  FYI: Men naturally produce less ejaculate volume as they age. This can result in less vigorous orgasms.  Treatment options: Vacuum erection device (VED): (aka, "penis pump") Risks include discomfort, bruising, and occasionally bleeding. Can purchase without a prescription either online or at some medical supply stores Can be use alone or with oral ED medications PDE-5 inhibitors (Viagra (Sildenafil), Cialis (Tadalafil), Levitra (Vardenafil)): Most common side effects are transient headache and seeing a blue ring around lights, nasal congestion, nausea. Cannot take with Nitroglycerin; need to be CV healthy. lntracavernosal injections  (ICI): Options: Alprostadil (Caverject): Pre-filled syringe Bimix or Trimix: Compounded. Prostin alone Need to bring supplies in for teaching appointment prior to use. Risks include development of penile plaques, priapism, bleeding, and/or pain.     4.  Surgery: Implantable penile prostheses   Anticipated future options: Eroxon: May be available in 2025. 1st FDA approved OTC treatment for erectile dysfunction. Topical gel. Comes in a single-dose tube.  Apply to the head of the penis for 15 seconds before sex. Should be effective within 10 minutes of application. The erection lasts long enough for successful sex in about 65% of people and should naturally subside.   I spent over 40 minutes either in face-to-face time with this patient or reviewing / documenting in this patient's chart on the date of this visit. Over 50% of that time was spent in direct counseling. E&M based on time and complexity of medical decision making.  Electronically signed by:  Donnita Falls, MSN, FNP-C, CUNP 03/01/2023 1:21 PM

## 2023-03-01 ENCOUNTER — Encounter: Payer: Self-pay | Admitting: Urology

## 2023-03-01 ENCOUNTER — Ambulatory Visit (INDEPENDENT_AMBULATORY_CARE_PROVIDER_SITE_OTHER): Payer: Medicaid Other | Admitting: Urology

## 2023-03-01 VITALS — BP 133/85 | HR 78

## 2023-03-01 DIAGNOSIS — Z59819 Housing instability, housed unspecified: Secondary | ICD-10-CM

## 2023-03-01 DIAGNOSIS — B1920 Unspecified viral hepatitis C without hepatic coma: Secondary | ICD-10-CM

## 2023-03-01 DIAGNOSIS — Z789 Other specified health status: Secondary | ICD-10-CM

## 2023-03-01 DIAGNOSIS — F32A Depression, unspecified: Secondary | ICD-10-CM

## 2023-03-01 DIAGNOSIS — E119 Type 2 diabetes mellitus without complications: Secondary | ICD-10-CM

## 2023-03-01 DIAGNOSIS — R45851 Suicidal ideations: Secondary | ICD-10-CM

## 2023-03-01 DIAGNOSIS — I639 Cerebral infarction, unspecified: Secondary | ICD-10-CM

## 2023-03-01 DIAGNOSIS — I10 Essential (primary) hypertension: Secondary | ICD-10-CM

## 2023-03-01 DIAGNOSIS — Z59868 Other specified financial insecurity: Secondary | ICD-10-CM

## 2023-03-01 DIAGNOSIS — J449 Chronic obstructive pulmonary disease, unspecified: Secondary | ICD-10-CM

## 2023-03-01 DIAGNOSIS — N401 Enlarged prostate with lower urinary tract symptoms: Secondary | ICD-10-CM

## 2023-03-01 DIAGNOSIS — R351 Nocturia: Secondary | ICD-10-CM

## 2023-03-01 DIAGNOSIS — N529 Male erectile dysfunction, unspecified: Secondary | ICD-10-CM

## 2023-03-01 DIAGNOSIS — G4733 Obstructive sleep apnea (adult) (pediatric): Secondary | ICD-10-CM | POA: Diagnosis not present

## 2023-03-01 DIAGNOSIS — I5031 Acute diastolic (congestive) heart failure: Secondary | ICD-10-CM

## 2023-03-01 DIAGNOSIS — Z5986 Financial insecurity: Secondary | ICD-10-CM

## 2023-03-01 LAB — URINALYSIS, ROUTINE W REFLEX MICROSCOPIC
Bilirubin, UA: NEGATIVE
Glucose, UA: NEGATIVE
Ketones, UA: NEGATIVE
Leukocytes,UA: NEGATIVE
Nitrite, UA: NEGATIVE
Protein,UA: NEGATIVE
RBC, UA: NEGATIVE
Specific Gravity, UA: 1.01 (ref 1.005–1.030)
Urobilinogen, Ur: 2 mg/dL — ABNORMAL HIGH (ref 0.2–1.0)
pH, UA: 6.5 (ref 5.0–7.5)

## 2023-03-01 LAB — BLADDER SCAN AMB NON-IMAGING: Scan Result: 0

## 2023-03-01 MED ORDER — SILDENAFIL CITRATE 100 MG PO TABS
100.0000 mg | ORAL_TABLET | Freq: Every day | ORAL | 1 refills | Status: DC | PRN
Start: 2023-03-01 — End: 2023-09-23

## 2023-03-01 NOTE — Patient Instructions (Addendum)
Reminder:  Please contact your primary care provider about how to obtain new CPAP for your obstructive sleep apnea, which is thought to be a primary contributing factor for your nocturnal polyuria / nocturia (excessive nighttime urinary frequency). Her name is [name]; phone # [phone].   ----------------------------------------------------------------------------------------------------------------------------  Regarding Erectile Dysfunction:  Contributing factors for ED: Psychological: Such as performance anxiety linked to previous bad experiences. Vasculogenic: Such as in men w/ HTN, hyperlipidemia, T2DM b/c these can cause decreased elasticity of blood vessels, which results in decreased ability for penile arteries to dilate and for penile veins to compress (resulting in venous leak). Neurologic: Such as in men w/ T2DM, stroke, Parkinson's. Nerve signalling is faulty between brain and penis. Can cause ED, decreased sensation, etc. Hypogonadism: May need testosterone replacement therapy.  FYI: Men naturally produce less ejaculate volume as they age. This can result in less vigorous orgasms.  Treatment options: Vacuum erection device (VED): (aka, "penis pump") Risks include discomfort, bruising, and occasionally bleeding. Can purchase without a prescription either online or at some medical supply stores Can be use alone or with oral ED medications PDE-5 inhibitors (Viagra (Sildenafil), Cialis (Tadalafil), Levitra (Vardenafil)): Most common side effects are transient headache and seeing a blue ring around lights, nasal congestion, nausea. Cannot take with Nitroglycerin; need to be CV healthy. lntracavernosal injections (ICI): Options: Alprostadil (Caverject): Pre-filled syringe Bimix or Trimix: Compounded. Prostin alone Need to bring supplies in for teaching appointment prior to use. Risks include development of penile plaques, priapism, bleeding, and/or pain.      4.  Surgery: Implantable penile prostheses   Anticipated future options: Eroxon: May be available in 2025. 1st FDA approved OTC treatment for erectile dysfunction. Topical gel. Comes in a single-dose tube.  Apply to the head of the penis for 15 seconds before sex. Should be effective within 10 minutes of application. The erection lasts long enough for successful sex in about 65% of people and should naturally subside.

## 2023-03-02 NOTE — Addendum Note (Signed)
Addended byEvette Georges on: 03/02/2023 03:02 PM   Modules accepted: Orders

## 2023-03-02 NOTE — Addendum Note (Signed)
Addended by: Christoper Fabian R on: 03/02/2023 02:25 PM   Modules accepted: Orders

## 2023-03-02 NOTE — Addendum Note (Signed)
Addended by: Ferdinand Lango on: 03/02/2023 02:12 PM   Modules accepted: Orders

## 2023-03-03 ENCOUNTER — Ambulatory Visit: Payer: Medicaid Other | Admitting: Diagnostic Neuroimaging

## 2023-03-03 ENCOUNTER — Encounter: Payer: Self-pay | Admitting: Diagnostic Neuroimaging

## 2023-03-03 VITALS — BP 117/79 | HR 77 | Ht 66.0 in | Wt 152.2 lb

## 2023-03-03 DIAGNOSIS — M48062 Spinal stenosis, lumbar region with neurogenic claudication: Secondary | ICD-10-CM | POA: Diagnosis not present

## 2023-03-03 DIAGNOSIS — M4802 Spinal stenosis, cervical region: Secondary | ICD-10-CM

## 2023-03-03 DIAGNOSIS — R269 Unspecified abnormalities of gait and mobility: Secondary | ICD-10-CM | POA: Diagnosis not present

## 2023-03-03 NOTE — Progress Notes (Signed)
[facility]  PATIENT: Kirk Oconnor DOB: 10-26-1955  REFERRING CLINICIAN: Felecia Shelling, Tesfaye Demissie* HISTORY FROM: patient REASON FOR VISIT: new consult   HISTORICAL  CHIEF COMPLAINT:  Chief Complaint  Patient presents with   New Patient (Initial Visit)    Patient in room #7 with his brother in law. Patient state he here today to follow up from his hospital to discuss his poss. stroke.     HISTORY OF PRESENT ILLNESS:   67 year old male here for evaluation of dizziness.  Patient has longstanding neck pain, low back pain, intermittent vertigo for many years.  In the past 6 months his balance and gait difficulty has significantly worsened.  He is having short shuffling steps, feels like he is off balance, feels like he could fall down.  He also has some lightheadedness and swimmy headedness sensation in the head, with sitting or standing.  Symptoms tend to be worse when he stands up.  Has some prior vertigo symptoms for many years ago but these have not recurred.  Also has had 40 pound weight loss in the last year and recently diagnosed with hyperthyroidism.  Also prior history of alcohol abuse, diabetes, hypertension.  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  His daughter passed away from fentanyl overdose.  Son was killed during a robbery.  Patient living alone.  He has some support from some family members.   REVIEW OF SYSTEMS: Full 14 system review of systems performed and negative with exception of: as per HPI.  ALLERGIES: No Known Allergies  HOME MEDICATIONS: Outpatient Medications Prior to Visit  Medication Sig Dispense Refill   amLODipine (NORVASC) 10 MG tablet Take 1 tablet (10 mg total) by mouth daily. 30 tablet 0   aspirin EC 81 MG tablet Take 81 mg by mouth daily.     gabapentin (NEURONTIN) 100 MG capsule Take 100 mg by mouth 2 (two) times daily.     meclizine (ANTIVERT) 25 MG tablet  Take 1 tablet (25 mg total) by mouth 3 (three) times daily as needed for dizziness. 30 tablet 0   metFORMIN (GLUCOPHAGE) 500 MG tablet Take 500 mg by mouth daily with breakfast.     methimazole (TAPAZOLE) 10 MG tablet Take 1 tablet (10 mg total) by mouth 2 (two) times daily. 60 tablet 1   omeprazole (PRILOSEC) 40 MG capsule Take 1 capsule (40 mg total) by mouth 2 (two) times daily. 60 capsule 11   sildenafil (VIAGRA) 100 MG tablet Take 1 tablet (100 mg total) by mouth daily as needed for erectile dysfunction. 30 tablet 1   No facility-administered medications prior to visit.    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   Arthritis    CAD (coronary artery disease)    Nonobstructive at cardiac catheterization July 2018 - [facility]   Carpal tunnel syndrome    Depression    Diabetes mellitus without complication (HCC)    Essential hypertension    GERD (gastroesophageal reflux disease)    Hepatitis C    Sleep apnea     PAST SURGICAL HISTORY: Past Surgical History:  Procedure Laterality Date   AORTIC VALVE REPLACEMENT  03/2017  [facility]   BIOPSY  12/15/2022   Procedure: BIOPSY;  Surgeon: Lanelle Bal, DO;  [facility];  Service: Endoscopy;;   COLONOSCOPY WITH PROPOFOL N/A 12/15/2022   Procedure: COLONOSCOPY WITH PROPOFOL;  Surgeon: Lanelle Bal, DO;  [facility];  Service: Endoscopy;  Laterality: N/A;  10:00 am, asa 3   ESOPHAGOGASTRODUODENOSCOPY (EGD) WITH PROPOFOL N/A 12/15/2022   Procedure: ESOPHAGOGASTRODUODENOSCOPY (EGD) WITH PROPOFOL;  Surgeon: Lanelle Bal, DO;  [facility];  Service: Endoscopy;  Laterality: N/A;   POLYPECTOMY  12/15/2022   Procedure:  POLYPECTOMY;  Surgeon: Lanelle Bal, DO;  [facility];  Service: Endoscopy;;    FAMILY HISTORY: Family History  Problem Relation Age of Onset   Diabetes Mother    Heart attack Mother    Heart attack Father     SOCIAL HISTORY: Social History   Socioeconomic History   Marital status: Divorced    Spouse name: Not on file   Number of children: Not on file   Years of education: Not on file   Highest education level: Not on file  Occupational History   Not on file  Tobacco Use   Smoking status: Some Days    Types: Cigarettes   Smokeless tobacco: Never  Vaping Use   Vaping Use: Never used  Substance and Sexual Activity   Alcohol use: Yes    Comment: socially   Drug use: No   Sexual activity: Yes  Other Topics Concern   Not on file  Social History Narrative   Not on file   Social Determinants of Health   Financial Resource Strain: Not on file  Food Insecurity: Not on file  Transportation Needs: Not on file  Physical Activity: Not on file  Stress: Not on file  Social Connections: Not on file  Intimate Partner Violence: Not on file     PHYSICAL EXAM  GENERAL EXAM/CONSTITUTIONAL: Vitals:  Vitals:   03/03/23 0830  BP: 117/79  Pulse: 77  Weight: 152 lb 3.2 oz (69 kg)  Height: 5\' 6"  (1.676 m)   Body mass index is 24.57 kg/m. Wt Readings from Last 3 Encounters:  03/03/23 152 lb 3.2 oz (69 kg)  01/20/23 155 lb 12.8 oz (70.7 kg)  01/18/23 155 lb 6.4 oz (70.5 kg)   Patient is in no distress; well developed, nourished and groomed; neck is supple  CARDIOVASCULAR: Examination of carotid arteries is normal; no carotid bruits Regular rate and rhythm, no murmurs Examination of peripheral vascular system by observation and palpation is normal  EYES: Ophthalmoscopic exam of optic discs and posterior segments is normal; no papilledema or hemorrhages No results found.  MUSCULOSKELETAL: Gait, strength, tone, movements noted in Neurologic exam  below  NEUROLOGIC: MENTAL STATUS:      No data to display         awake, alert, oriented to person, place and time recent and remote memory intact normal attention and concentration language fluent, comprehension intact, naming intact fund of knowledge appropriate  CRANIAL NERVE:  2nd - no papilledema on fundoscopic exam 2nd, 3rd, 4th, 6th - pupils equal and reactive to light, visual fields full to confrontation, extraocular muscles intact, no nystagmus 5th - facial sensation symmetric 7th - facial strength symmetric 8th - hearing intact 9th - palate elevates symmetrically, uvula midline 11th - shoulder shrug symmetric 12th - tongue protrusion midline  MOTOR:  normal bulk and tone, full strength in the BUE, BLE BUE 4+; EXCEPT LEFT TRICEPS  3; FINGER ABDUCTION 4; GRIP 4 BLE 5; EXCEPT LEFT HIP FLEXION 4+; LEFT KNEE FLEX 4   SENSORY:  normal and symmetric to light touch, temperature, vibration  COORDINATION:  finger-nose-finger, fine finger movements normal  REFLEXES:  deep tendon reflexes --> BUE 1+; LEFT FNEE 3; RIGHT KNEE 2+; ANKLES TRACE; POSITIVE SUPRAPATELLAR REFLEXES (L > R); NEGATIVE HOFFMANS  GAIT/STATION:  narrow based gait; SHORT STEPS; UNSTEADY     DIAGNOSTIC DATA (LABS, IMAGING, TESTING) - I reviewed patient records, labs, notes, testing and imaging myself where available.  Lab Results  Component Value Date   WBC 6.8 02/23/2023   HGB 13.8 02/23/2023   HCT 40.1 02/23/2023   MCV 84.8 02/23/2023   PLT 148 (L) 02/23/2023      Component Value Date/Time   NA 128 (L) 02/23/2023 1130   NA 139 06/26/2017 0908   K 3.6 02/23/2023 1130   CL 97 (L) 02/23/2023 1130   CO2 23 02/23/2023 1130   GLUCOSE 121 (H) 02/23/2023 1130   BUN 9 02/23/2023 1130   BUN 11 06/26/2017 0908   CREATININE 0.81 02/23/2023 1130   CREATININE 0.98 03/11/2013 1036   CALCIUM 9.3 02/23/2023 1130   PROT 7.3 02/23/2023 1130   PROT 7.1 06/26/2017 0908   ALBUMIN 3.5 02/23/2023 1130    ALBUMIN 3.8 06/26/2017 0908   AST 19 02/23/2023 1130   ALT 22 02/23/2023 1130   ALKPHOS 73 02/23/2023 1130   BILITOT 0.9 02/23/2023 1130   BILITOT 0.3 06/26/2017 0908   GFRNONAA >60 02/23/2023 1130   GFRNONAA 87 03/11/2013 1036   GFRAA >60 07/11/2017 2043   GFRAA >89 03/11/2013 1036   Lab Results  Component Value Date   CHOL 149 01/05/2023   HDL 31 (L) 01/05/2023   LDLCALC 100 (H) 01/05/2023   TRIG 92 01/05/2023   CHOLHDL 4.8 01/05/2023   Lab Results  Component Value Date   HGBA1C 6.6 (H) 11/22/2022   No results found for: "VITAMINB12" Lab Results  Component Value Date   TSH <0.005 (L) 01/30/2023    11/01/10 MRI lumbar spine 1.  Moderate multifactorial spinal and bilateral lateral recess  stenosis at L4-5.  Mild foraminal stenosis bilaterally also, left  greater than right.  2.  Mild multifactorial spinal, lateral recess and foraminal  stenosis at L3-4.  3.  Mild spinal and bilateral lateral recess stenosis at L2-3.  Bilateral foraminal encroachment also.  4.  Small left paracentral disc protrusion at L5-S1.   01/13/23 MRI brain 1. Small focus of hyperintense signal on diffusion weighted imaging in the periatrial white matter on the left, which could represent an acute infarct. Note if there is clinical concern for intracranial metastatic disease, further evaluation with a contrast-enhanced MRI is recommended. 2. Innumerable small foci of microhemorrhage in the bilateral cerebral and cerebellar hemispheres, which are nonspecific but can be seen in the setting of cerebral amyloid angiopathy. 3. Diffusely T1 hypointense appearance of the cervical spine vertebral bodies, nonspecific, but marrow infiltrative process is not excluded. There is likely also severe spinal canal stenosis at C3-C4. Recommend further evaluation with a cervical spine MRI.   ASSESSMENT AND PLAN  67 y.o. year old male here with:   Dx:  1. Gait difficulty   2. Cervical spinal stenosis    3. Spinal stenosis of lumbar region with neurogenic claudication      PLAN:  GAIT DIFFICULTY (since ~ 6-12 months; worsening; likely due to cervical spine stenosis at C3-4; also history of mod-severe spinal stenosis at  L4-5) - cervical spinal stenosis noted on MRI brain; follow up MRI cervical spine - worsening back pain; gait diff; weight loss; lumbar spinal stenosis with neurogenic claudication - use cane / walker; consider PT evaluation  ORTHOSTATIC LIGHTHEADEDNESS / DIZZINESS - could be related to diabetic neuropathy, alcoholic neuropathy or volume depletion; also recently diagnosed with hyperthyroidism - monitor BP; stay hydrated; optimize nutrition / exercise  WEIGHT LOSS / HYPERTHYROIDISM - follow up with endocrinology  INTERMITTENT POSITIONAL VERTIGO - no current symptoms; monitor  Orders Placed This Encounter  Procedures   MR CERVICAL SPINE WO CONTRAST   MR LUMBAR SPINE WO CONTRAST   Return for pending if symptoms worsen or fail to improve, pending test results.  I reviewed images, labs, notes, records myself. I summarized findings and reviewed with patient, for this high risk condition (spinal stenosis; gait diff) requiring high complexity decision making.    Suanne Marker, MD 03/03/2023, 9:56 AM Certified in Neurology, Neurophysiology and Neuroimaging  [facility] [address] [place], [place] [address] [phone]

## 2023-03-03 NOTE — Patient Instructions (Addendum)
  GAIT DIFFICULTY (likely cervical spine stenosis at C3-4; also history of mod-severe spinal stenosis at L4-5) - cervical spinal stenosis noted on MRI brain; follow up MRI cervical spine - worsening back pain; gait diff; weight loss; lumbar spinal stenosis with neurogenic claudication - use cane / walker; consider PT evaluation  ORTHOSTATIC LIGHTHEADEDNESS / DIZZINESS - could be related to diabetic neuropathy, alcoholic neuropathy or volume depletion; also recently diagnosed with hyperthyroidism - monitor BP; stay hydrated; optimize nutrition / exercise  WEIGHT LOSS / HYPERTHYROIDISM - follow up with endocrinology  INTERMITTENT POSITIONAL VERTIGO - no current symptoms; monitor

## 2023-03-06 ENCOUNTER — Telehealth: Payer: Self-pay

## 2023-03-08 ENCOUNTER — Telehealth: Payer: Self-pay | Admitting: Neurology

## 2023-03-08 NOTE — Telephone Encounter (Signed)
MRI thoracic spine [employer] auth: [id number] exp. 03/08/23-04/22/23   MRI cervical spine is pending medical review case #[id number]  He is scheduled at GI

## 2023-03-09 ENCOUNTER — Telehealth: Payer: Self-pay

## 2023-03-13 ENCOUNTER — Ambulatory Visit
Admission: RE | Admit: 2023-03-13 | Discharge: 2023-03-13 | Disposition: A | Discharge status: Home / Self Care | Payer: Medicaid Other | Source: Ambulatory Visit | Attending: Diagnostic Neuroimaging | Admitting: Diagnostic Neuroimaging

## 2023-03-13 ENCOUNTER — Telehealth: Payer: Self-pay

## 2023-03-13 DIAGNOSIS — R269 Unspecified abnormalities of gait and mobility: Secondary | ICD-10-CM

## 2023-03-13 DIAGNOSIS — R634 Abnormal weight loss: Secondary | ICD-10-CM

## 2023-03-13 DIAGNOSIS — M48062 Spinal stenosis, lumbar region with neurogenic claudication: Secondary | ICD-10-CM

## 2023-03-13 DIAGNOSIS — M4802 Spinal stenosis, cervical region: Secondary | ICD-10-CM

## 2023-03-13 DIAGNOSIS — E119 Type 2 diabetes mellitus without complications: Secondary | ICD-10-CM

## 2023-03-13 DIAGNOSIS — I10 Essential (primary) hypertension: Secondary | ICD-10-CM

## 2023-03-13 NOTE — Telephone Encounter (Signed)
  Follow up related to provider's request for outreach. Initially unable to connect with patient today. Patient's sister later returned call. Reports patient is agreeable to care management services.  Referral placed for outreach with the Managed Medicaid team.  Will attempt outreach again this week.

## 2023-03-13 NOTE — Telephone Encounter (Signed)
MR cervical spine [employer] medicaid Berkley Harvey: [id number] exp. 03/13/23-04/27/23 for GI

## 2023-03-21 ENCOUNTER — Other Ambulatory Visit: Payer: Medicaid Other | Admitting: *Deleted

## 2023-03-21 ENCOUNTER — Encounter: Payer: Self-pay | Admitting: *Deleted

## 2023-03-21 NOTE — Patient Outreach (Signed)
Medicaid Managed Care   Nurse Care Manager Note  03/21/2023 Name:  Kirk Oconnor MRN:  [id number] DOB:  [birth year]  Kirk Oconnor is an 67 y.o. year old male who is a primary patient of Kirk Nigel Berthold, FNP.  The [facility] Managed Care Coordination team was consulted for assistance with:    Chronic pain  Kirk Oconnor was given information about Medicaid Managed Care Coordination team services today. Kirk Oconnor Patient agreed to services and verbal consent obtained.  Engaged with patient by telephone for follow up visit in response to provider referral for case management and/or care coordination services.   Assessments/Interventions:  Review of past medical history, allergies, medications, health status, including review of consultants reports, laboratory and other test data, was performed as part of comprehensive evaluation and provision of chronic care management services.  SDOH (Social Determinants of Health) assessments and interventions performed: SDOH Interventions    Flowsheet Row Patient Outreach Telephone from 03/21/2023 in [place] [facility] Office Visit from 03/01/2023 in [facility] [place] Office Visit from 01/20/2023 in [facility]  SDOH Interventions     Food Insecurity Interventions Intervention Not Indicated -- --  Housing Interventions Other (Comment)  [Patient is scheduled with BSW on 03/23/23] AMB Referral --  Transportation Interventions Intervention Not Indicated -- --  Utilities Interventions Intervention Not Indicated -- --  Depression Interventions/Treatment  -- -- Counseling       Care Plan  No Known Allergies  Medications Reviewed Today     Reviewed by Heidi Dach, RN (Registered Nurse) on 03/21/23 at 1452  Med List Status: <None>   Medication Order Taking? Sig Documenting Provider Last Dose Status Informant  acetaminophen (TYLENOL) 500 MG tablet [id number] Yes Take 500 mg by mouth  every 6 (six) hours as needed. [provider] Taking Active   amLODipine (NORVASC) 10 MG tablet [id number] Yes Take 1 tablet (10 mg total) by mouth daily. Smoot, Shawn Route, PA-C Taking Active   aspirin EC 81 MG tablet [id number] No Take 81 mg by mouth daily.  Patient not taking: Reported on 03/21/2023   [provider] Not Taking Active Family Member  gabapentin (NEURONTIN) 100 MG capsule [id number] No Take 100 mg by mouth 2 (two) times daily.  Patient not taking: Reported on 03/21/2023   [provider] Not Taking Active Family Member  meclizine (ANTIVERT) 25 MG tablet [id number] Yes Take 1 tablet (25 mg total) by mouth 3 (three) times daily as needed for dizziness. Smoot, Shawn Route, PA-C Taking Active   metFORMIN (GLUCOPHAGE) 500 MG tablet [id number] No Take 500 mg by mouth daily with breakfast. [provider] Unknown Active   methimazole (TAPAZOLE) 10 MG tablet [id number] Yes Take 1 tablet (10 mg total) by mouth 2 (two) times daily. Kirk Newman Nip, Tenna Child, FNP Taking Active   omeprazole (PRILOSEC) 40 MG capsule [id number] Yes Take 1 capsule (40 mg total) by mouth 2 (two) times daily. Lanelle Bal, DO Taking Active            Med Note Ardelia Mems, Yonis Carreon A   Tue Mar 21, 2023  2:52 PM) Taking as needed  sildenafil (VIAGRA) 100 MG tablet [id number] No Take 1 tablet (100 mg total) by mouth daily as needed for erectile dysfunction. Donnita Falls, FNP Unknown Active             Patient Active Problem List   Diagnosis Date Noted   Erectile dysfunction 02/28/2023  Type 2 diabetes mellitus (HCC) 01/20/2023   Nocturia 01/18/2023   Unexplained weight loss 01/18/2023   Hepatic cirrhosis (HCC) 07/20/2022   Hyperglycemia 07/11/2017   Thrombocytopenia (HCC) 07/11/2017   Chronic back pain 06/26/2017   COPD (chronic obstructive pulmonary disease) (HCC) 04/14/2017   CVA (cerebral vascular accident) (HCC) 04/14/2017   History of alcohol abuse 04/14/2017   History of  cocaine abuse (HCC) 04/14/2017   OSA (obstructive sleep apnea) 04/14/2017   BPH (benign prostatic hyperplasia) 10/22/2016   GERD (gastroesophageal reflux disease) 10/22/2016   Aortic root dilation (HCC) 10/21/2016   Acute diastolic CHF (congestive heart failure) (HCC) 10/21/2016   Essential hypertension, benign 03/27/2013   Hepatitis C 03/27/2013   Snoring 03/27/2013    Conditions to be addressed/monitored per PCP order:   chronic pain  Care Plan : RN Care Manager Plan of Care  Updates made by Heidi Dach, RN since 03/21/2023 12:00 AM     Problem: Health Management needs related to Chronic Pain      Long-Range Goal: Development of Plan of Care to address Health Management needs related to Chronic Pain   Start Date: 03/21/2023  Expected End Date: 06/19/2023  Note:   Current Barriers:  Chronic Disease Management support and education needs related to Chronic Pain  RNCM Clinical Goal(s):  Patient will verbalize understanding of plan for management of Chronic Pain as evidenced by patient reports take all medications exactly as prescribed and will call provider for medication related questions as evidenced by patient reports    attend all scheduled medical appointments: 03/23/23 with BSW, 03/27/23 with GI, 04/21/23 with PCP and 05/16/23 with Dr. Fransico Him as evidenced by provider documentation in EMR        continue to work with RN Care Manager and/or Social Worker to address care management and care coordination needs related to chronic pain as evidenced by adherence to CM Team Scheduled appointments        Interventions: Inter-disciplinary care team collaboration (see longitudinal plan of care) Evaluation of current treatment plan related to  self management and patient's adherence to plan as established by provider   Pain:  (Status: New goal.) Long Term Goal  Pain assessment performed Medications reviewed Reviewed provider established plan for pain management; Discussed importance of  adherence to all scheduled medical appointments; Counseled on the importance of reporting any/all new or changed pain symptoms or management strategies to pain management provider; Advised patient to report to care team affect of pain on daily activities; Discussed use of relaxation techniques and/or diversional activities to assist with pain reduction (distraction, imagery, relaxation, massage, acupressure, TENS, heat, and cold application; Reviewed with patient prescribed pharmacological and nonpharmacological pain relief strategies; Assessed social determinant of health barriers;  Collaborated with Dr.  Marjory Lies regarding MRI results and needing CPAP Collaborated with PCP regarding gabapentin(patient has not had in several months, prescribed by previous PCP) Provided patient with [facility] member services [phone] for member benefits Provided patient with [facility] medical transportation [phone]  Patient Goals/Self-Care Activities: Take medications as prescribed   Attend all scheduled provider appointments Call provider office for new concerns or questions  Work with the social worker to address care coordination needs and will continue to work with the clinical team to address health care and disease management related needs       Follow Up:  Patient agrees to Care Plan and Follow-up.  Plan: The Managed Medicaid care management team will reach out to the patient again over the next 30 days.  Date/time of next scheduled RN care management/care coordination outreach:  05/02/23 @ 2:30 pm  [name] RN, BSN [place]  Managed [facility] RN Care Coordinator [phone]

## 2023-03-21 NOTE — Patient Instructions (Signed)
Visit Information  Kirk Oconnor was given information about Medicaid Managed Care team care coordination services as a part of their [facility] Community Plan Medicaid benefit. Kirk Oconnor verbally consented to engagement with the [facility] Managed Care team.   If you are experiencing a medical emergency, please call 911 or report to your local emergency department or urgent care.   If you have a non-emergency medical problem during routine business hours, please contact your provider's office and ask to speak with a nurse.   For questions related to your [facility], please call: [phone] or visit the homepage here: [email or web address]  If you would like to schedule transportation through your [facility], please call the following number at least 2 days in advance of your appointment: [phone]   Rides for urgent appointments can also be made after hours by calling Member Services.  Call the Behavioral Health Crisis Line at [phone], at any time, 24 hours a day, 7 days a week. If you are in danger or need immediate medical attention call 911.  If you would like help to quit smoking, call 1-800-QUIT-NOW ((209)554-5081) OR Espaol: 1-855-Djelo-Ya (1-324-401-0272) o para ms informacin haga clic aqu or Text READY to 536-644 to register via text  Kirk Oconnor,   Please see education materials related to pain provided as print materials.   The patient verbalized understanding of instructions, educational materials, and care plan provided today and agreed to receive a mailed copy of patient instructions, educational materials, and care plan.   Telephone follow up appointment with Managed Medicaid care management team member scheduled for:05/02/23 @ 2:30 pm  Kirk Emms RN, BSN [place]  Managed [facility] RN Care Coordinator [phone]   Following is a copy of  your plan of care:  Care Plan : RN Care Manager Plan of Care  Updates made by Kirk Dach, RN since 03/21/2023 12:00 AM     Problem: Health Management needs related to Chronic Pain      Long-Range Goal: Development of Plan of Care to address Health Management needs related to Chronic Pain   Start Date: 03/21/2023  Expected End Date: 06/19/2023  Note:   Current Barriers:  Chronic Disease Management support and education needs related to Chronic Pain  RNCM Clinical Goal(s):  Patient will verbalize understanding of plan for management of Chronic Pain as evidenced by patient reports take all medications exactly as prescribed and will call provider for medication related questions as evidenced by patient reports    attend all scheduled medical appointments: 03/23/23 with BSW, 03/27/23 with GI, 04/21/23 with PCP and 05/16/23 with Dr. Fransico Him as evidenced by provider documentation in EMR        continue to work with RN Care Manager and/or Social Worker to address care management and care coordination needs related to chronic pain as evidenced by adherence to CM Team Scheduled appointments        Interventions: Inter-disciplinary care team collaboration (see longitudinal plan of care) Evaluation of current treatment plan related to  self management and patient's adherence to plan as established by provider   Pain:  (Status: New goal.) Long Term Goal  Pain assessment performed Medications reviewed Reviewed provider established plan for pain management; Discussed importance of adherence to all scheduled medical appointments; Counseled on the importance of reporting any/all new or changed pain symptoms or management strategies to pain management provider; Advised patient to report to care team affect of pain on daily activities; Discussed  use of relaxation techniques and/or diversional activities to assist with pain reduction (distraction, imagery, relaxation, massage, acupressure, TENS, heat, and  cold application; Reviewed with patient prescribed pharmacological and nonpharmacological pain relief strategies; Assessed social determinant of health barriers;  Collaborated with Dr.  Marjory Lies regarding MRI results and needing CPAP Collaborated with PCP regarding gabapentin(patient has not had in several months, prescribed by previous PCP) Provided patient with [facility] member services [phone] for member benefits Provided patient with [facility] medical transportation [phone]  Patient Goals/Self-Care Activities: Take medications as prescribed   Attend all scheduled provider appointments Call provider office for new concerns or questions  Work with the social worker to address care coordination needs and will continue to work with the clinical team to address health care and disease management related needs

## 2023-03-23 ENCOUNTER — Other Ambulatory Visit: Payer: Medicaid Other

## 2023-03-23 NOTE — Patient Instructions (Signed)
Visit Information  Mr. Bissonnette was given information about Medicaid Managed Care team care coordination services as a part of their [facility] Community Plan Medicaid benefit. [name] verbally consented to engagement with the [facility] Managed Care team.   If you are experiencing a medical emergency, please call 911 or report to your local emergency department or urgent care.   If you have a non-emergency medical problem during routine business hours, please contact your provider's office and ask to speak with a nurse.   For questions related to your [facility], please call: [phone] or visit the homepage here: [email or web address]  If you would like to schedule transportation through your [facility], please call the following number at least 2 days in advance of your appointment: [phone]   Rides for urgent appointments can also be made after hours by calling Member Services.  Call the Behavioral Health Crisis Line at [phone], at any time, 24 hours a day, 7 days a week. If you are in danger or need immediate medical attention call 911.  If you would like help to quit smoking, call 1-800-QUIT-NOW ((425)085-6908) OR Espaol: 1-855-Djelo-Ya (9-518-841-6606) o para ms informacin haga clic aqu or Text READY to 301-601 to register via text  Mr. Marzella - following are the goals we discussed in your visit today:   Goals Addressed   None       Social Worker will follow up with housing.   [name], [name], MHA [facility]  Managed Medicaid Social Worker [phone]   Following is a copy of your plan of care:  Care Plan : RN Care Manager Plan of Care  Updates made by Shaune Leeks since 03/23/2023 12:00 AM     Problem: Health Management needs related to Chronic Pain      Long-Range Goal: Development of Plan of Care to address Health  Management needs related to Chronic Pain   Start Date: 03/21/2023  Expected End Date: 06/19/2023  Note:   Current Barriers:  Chronic Disease Management support and education needs related to Chronic Pain  RNCM Clinical Goal(s):  Patient will verbalize understanding of plan for management of Chronic Pain as evidenced by patient reports take all medications exactly as prescribed and will call provider for medication related questions as evidenced by patient reports    attend all scheduled medical appointments: 03/23/23 with BSW, 03/27/23 with GI, 04/21/23 with PCP and 05/16/23 with Dr. Fransico Him as evidenced by provider documentation in EMR        continue to work with RN Care Manager and/or Social Worker to address care management and care coordination needs related to chronic pain as evidenced by adherence to CM Team Scheduled appointments        Interventions: Inter-disciplinary care team collaboration (see longitudinal plan of care) Evaluation of current treatment plan related to  self management and patient's adherence to plan as established by provider BSW completed a telephone outreach with patients sister, she states patient lives in a boarding house and was given a 10 day notice to vacate. Patient receives social security at 500 each month. BSW informed housing is not easy to come by, but would email some resources for housing in [place]. Patient currently receives foodstamps. No other resources are neede.d at this time   Pain:  (Status: New goal.) Long Term Goal  Pain assessment performed Medications reviewed Reviewed provider established plan for pain management; Discussed importance of adherence to all scheduled medical appointments;  Counseled on the importance of reporting any/all new or changed pain symptoms or management strategies to pain management provider; Advised patient to report to care team affect of pain on daily activities; Discussed use of relaxation techniques and/or diversional  activities to assist with pain reduction (distraction, imagery, relaxation, massage, acupressure, TENS, heat, and cold application; Reviewed with patient prescribed pharmacological and nonpharmacological pain relief strategies; Assessed social determinant of health barriers;  Collaborated with Dr.  Marjory Lies regarding MRI results and needing CPAP Collaborated with PCP regarding gabapentin(patient has not had in several months, prescribed by previous PCP) Provided patient with [facility] member services [phone] for member benefits Provided patient with [facility] medical transportation [phone]  Patient Goals/Self-Care Activities: Take medications as prescribed   Attend all scheduled provider appointments Call provider office for new concerns or questions  Work with the social worker to address care coordination needs and will continue to work with the clinical team to address health care and disease management related needs

## 2023-03-23 NOTE — Patient Outreach (Signed)
Medicaid Managed Care Social Work Note  03/23/2023 Name:  Kirk Oconnor MRN:  [id number] DOB:  1956/02/11  Kirk Oconnor is an 67 y.o. year old male who is a primary patient of Del Nigel Berthold, FNP.  The [facility] Managed Care Coordination team was consulted for assistance with:   housing   Mr. Durnil was given information about Medicaid Managed Care Coordination team services today. Kirk Oconnor Primary Caregiver agreed to services and verbal consent obtained.  Engaged with patient  for by telephone forinitial visit in response to referral for case management and/or care coordination services.   Assessments/Interventions:  Review of past medical history, allergies, medications, health status, including review of consultants reports, laboratory and other test data, was performed as part of comprehensive evaluation and provision of chronic care management services.  SDOH: (Social Determinant of Health) assessments and interventions performed: SDOH Interventions    Flowsheet Row Patient Outreach Telephone from 03/21/2023 in [facility] Office Visit from 03/01/2023 in [facility] [place] Office Visit from 01/20/2023 in [facility]  SDOH Interventions     Food Insecurity Interventions Intervention Not Indicated -- --  Housing Interventions Other (Comment)  [Patient is scheduled with BSW on 03/23/23] AMB Referral --  Transportation Interventions Intervention Not Indicated -- --  Utilities Interventions Intervention Not Indicated -- --  Depression Interventions/Treatment  -- -- Counseling     BSW completed a telephone outreach with patients sister, she states patient lives in a boarding house and was given a 10 day notice to vacate. Patient receives social security at 500 each month. BSW informed housing is not easy to come by, but would email some resources for housing in [place]. Patient currently receives foodstamps. No other  resources are neede.d at this time  Advanced Directives Status:  Not addressed in this encounter.  Care Plan                 No Known Allergies  Medications Reviewed Today     Reviewed by Heidi Dach, RN (Registered Nurse) on 03/21/23 at 1452  Med List Status: <None>   Medication Order Taking? Sig Documenting Provider Last Dose Status Informant  acetaminophen (TYLENOL) 500 MG tablet [id number] Yes Take 500 mg by mouth every 6 (six) hours as needed. [provider] Taking Active   amLODipine (NORVASC) 10 MG tablet [id number] Yes Take 1 tablet (10 mg total) by mouth daily. Smoot, Shawn Route, PA-C Taking Active   aspirin EC 81 MG tablet [id number] No Take 81 mg by mouth daily.  Patient not taking: Reported on 03/21/2023   [provider] Not Taking Active Family Member  gabapentin (NEURONTIN) 100 MG capsule [id number] No Take 100 mg by mouth 2 (two) times daily.  Patient not taking: Reported on 03/21/2023   [provider] Not Taking Active Family Member  meclizine (ANTIVERT) 25 MG tablet [id number] Yes Take 1 tablet (25 mg total) by mouth 3 (three) times daily as needed for dizziness. Smoot, Shawn Route, PA-C Taking Active   metFORMIN (GLUCOPHAGE) 500 MG tablet [id number] No Take 500 mg by mouth daily with breakfast. [provider] Unknown Active   methimazole (TAPAZOLE) 10 MG tablet [id number] Yes Take 1 tablet (10 mg total) by mouth 2 (two) times daily. Del Newman Nip, Tenna Child, FNP Taking Active   omeprazole (PRILOSEC) 40 MG capsule [id number] Yes Take 1 capsule (40 mg total) by mouth 2 (two) times daily. Lanelle Bal, DO Taking Active  Med Note (ROBB, MELANIE A   Tue Mar 21, 2023  2:52 PM) Taking as needed  sildenafil (VIAGRA) 100 MG tablet [id number] No Take 1 tablet (100 mg total) by mouth daily as needed for erectile dysfunction. Donnita Falls, FNP Unknown Active             Patient Active Problem List   Diagnosis Date Noted    Erectile dysfunction 02/28/2023   Type 2 diabetes mellitus (HCC) 01/20/2023   Nocturia 01/18/2023   Unexplained weight loss 01/18/2023   Hepatic cirrhosis (HCC) 07/20/2022   Hyperglycemia 07/11/2017   Thrombocytopenia (HCC) 07/11/2017   Chronic back pain 06/26/2017   COPD (chronic obstructive pulmonary disease) (HCC) 04/14/2017   CVA (cerebral vascular accident) (HCC) 04/14/2017   History of alcohol abuse 04/14/2017   History of cocaine abuse (HCC) 04/14/2017   OSA (obstructive sleep apnea) 04/14/2017   BPH (benign prostatic hyperplasia) 10/22/2016   GERD (gastroesophageal reflux disease) 10/22/2016   Aortic root dilation (HCC) 10/21/2016   Acute diastolic CHF (congestive heart failure) (HCC) 10/21/2016   Essential hypertension, benign 03/27/2013   Hepatitis C 03/27/2013   Snoring 03/27/2013    Conditions to be addressed/monitored per PCP order:   housing   Care Plan : RN Care Manager Plan of Care  Updates made by Shaune Leeks since 03/23/2023 12:00 AM     Problem: Health Management needs related to Chronic Pain      Long-Range Goal: Development of Plan of Care to address Health Management needs related to Chronic Pain   Start Date: 03/21/2023  Expected End Date: 06/19/2023  Note:   Current Barriers:  Chronic Disease Management support and education needs related to Chronic Pain  RNCM Clinical Goal(s):  Patient will verbalize understanding of plan for management of Chronic Pain as evidenced by patient reports take all medications exactly as prescribed and will call provider for medication related questions as evidenced by patient reports    attend all scheduled medical appointments: 03/23/23 with BSW, 03/27/23 with GI, 04/21/23 with PCP and 05/16/23 with Dr. Fransico Him as evidenced by provider documentation in EMR        continue to work with RN Care Manager and/or Social Worker to address care management and care coordination needs related to chronic pain as evidenced by adherence  to CM Team Scheduled appointments        Interventions: Inter-disciplinary care team collaboration (see longitudinal plan of care) Evaluation of current treatment plan related to  self management and patient's adherence to plan as established by provider BSW completed a telephone outreach with patients sister, she states patient lives in a boarding house and was given a 10 day notice to vacate. Patient receives social security at 500 each month. BSW informed housing is not easy to come by, but would email some resources for housing in [place]. Patient currently receives foodstamps. No other resources are neede.d at this time   Pain:  (Status: New goal.) Long Term Goal  Pain assessment performed Medications reviewed Reviewed provider established plan for pain management; Discussed importance of adherence to all scheduled medical appointments; Counseled on the importance of reporting any/all new or changed pain symptoms or management strategies to pain management provider; Advised patient to report to care team affect of pain on daily activities; Discussed use of relaxation techniques and/or diversional activities to assist with pain reduction (distraction, imagery, relaxation, massage, acupressure, TENS, heat, and cold application; Reviewed with patient prescribed pharmacological and nonpharmacological pain relief strategies; Assessed social determinant of  health barriers;  Collaborated with Dr.  Marjory Lies regarding MRI results and needing CPAP Collaborated with PCP regarding gabapentin(patient has not had in several months, prescribed by previous PCP) Provided patient with [facility] member services [phone] for member benefits Provided patient with [facility] medical transportation [phone]  Patient Goals/Self-Care Activities: Take medications as prescribed   Attend all scheduled provider appointments Call provider office for new concerns or questions  Work with the social worker to address  care coordination needs and will continue to work with the clinical team to address health care and disease management related needs       Follow up:  Patient agrees to Care Plan and Follow-up.  Plan: The Managed Medicaid care management team will reach out to the patient again over the next 30 days.  Date/time of next scheduled Social Work care management/care coordination outreach:  04/24/23  [name], [name], G [facility]ber [facility]  Managed [facility] Social Worker [phone]

## 2023-03-24 ENCOUNTER — Other Ambulatory Visit: Payer: Self-pay | Admitting: Family Medicine

## 2023-03-24 NOTE — Addendum Note (Signed)
Addended by: Joycelyn Schmid R on: 03/24/2023 09:30 AM   Modules accepted: Orders

## 2023-03-26 NOTE — Progress Notes (Signed)
GI Office Note    Referring Provider: Wylene Men* Primary Care Physician:  [name], FNP  Primary Gastroenterologist: Hennie Duos. Marletta Lor, DO   Chief Complaint   Chief Complaint  Patient presents with   Follow-up    Still losing weight and dizzy    History of Present Illness   JONAEL DARLEY is a 67 y.o. male presenting today for follow up. Last seen 12/2022. H/o chronic GERD, HCV cirrhosis s/p Epclusa, completed treatment end of January. H/o weight loss.   Patient has neurosurgery consult pending due to lumbar and cervical spinal stenosis. Sees endocrinology in 04/2023 for hyperthyroidism.  Eating good. Weight stable the last few months. His PCP has been treating him for overactive thyroid. Endocrinology appt pending, patient is concerned about how long he is having to wait for an appt to see endocrinology. He denies abd pain. BMs regular. No melena, brbpr.   Labs 01/2023: MELD 3.0 of 14. Na 128. Plans for repeat labs in 2 weeks. HCV RNA negative.   CT A/P/chest 12/2022: IMPRESSION: No bowel obstruction, free air or free fluid. Diffuse colonic stool. Normal appendix.   Small cystic lesion along the tail of the pancreas of uncertain etiology and significance. Would recommend follow-up imaging in 6-12 months. This could be performed with MRI or CT. Pancreas divisum.    Postop chest with prosthetic aortic valve and graft along the ascending aorta. Stable ectasia of the aortic arch and descending thoracic aorta. Diameter measures up to 3.7 cm. Recommend annual imaging followup by CTA or MRA. This recommendation follows 2010 ACCF/AHA/AATS/ACR/ASA/SCA/SCAI/SIR/STS/SVM Guidelines for the Diagnosis and Management of Patients with Thoracic Aortic Disease. Circulation.2010; 121: Z610-R604. Aortic aneurysm NOS (ICD10-I71.9)   Mild fatty liver infiltration.   MRI lumbar 02/2023: IMPRESSION: MRI lumbar spine without contrast showing prominent  spondylitic changes from L2-S1 most prominent at L4-5 where there is moderate posterior canal and severe right greater than left foraminal narrowing with possible encroachment on exiting nerve root on the right.  L2-3, L3-4 and L5-S1 also show mild posterior canal and severe bilateral right greater than left foraminal narrowing.   MRI cervical 02/2023: IMPRESSION: MRI scan cervical spine without contrast showing prominent spondylitic changes from C3-C7 most severe at C3-4 where there is severe canal and left greater than right foraminal narrowing with atrophy of the cord.  There is also mild canal and foraminal narrowing at C 3- 4, C5-6 and C6-7.  The spinal cord appears to be atrophied throughout.   EGD 11/2022: -moderately severe reflux esophagitis s/p bx -gastritis s/p bx   Colonoscopy 11/2022: -nonbleeding internal hemorrhoids -two 4-85mm polyps in transverse colon and ascending colon s/p resection -tubular adenomas -next tcs 7 years  Wt Readings from Last 10 Encounters:  03/27/23 154 lb 12.8 oz (70.2 kg)  03/03/23 152 lb 3.2 oz (69 kg)  01/20/23 155 lb 12.8 oz (70.7 kg)  01/18/23 155 lb 6.4 oz (70.5 kg)  01/13/23 160 lb (72.6 kg)  12/15/22 162 lb 0.6 oz (73.5 kg)  12/13/22 162 lb 0.6 oz (73.5 kg)  11/21/22 162 lb (73.5 kg)  07/20/22 175 lb 3.2 oz (79.5 kg)  04/18/22 182 lb 3.2 oz (82.6 kg)     Medications   Current Outpatient Medications  Medication Sig Dispense Refill   acetaminophen (TYLENOL) 500 MG tablet Take 500 mg by mouth every 6 (six) hours as needed.     amLODipine (NORVASC) 10 MG tablet Take 1 tablet (10 mg total) by  mouth daily. 30 tablet 0   meclizine (ANTIVERT) 25 MG tablet Take 1 tablet (25 mg total) by mouth 3 (three) times daily as needed for dizziness. 30 tablet 0   metFORMIN (GLUCOPHAGE) 500 MG tablet Take 500 mg by mouth daily with breakfast.     methimazole (TAPAZOLE) 10 MG tablet Take 1 tablet by mouth twice daily 60 tablet 0   sildenafil (VIAGRA) 100 MG  tablet Take 1 tablet (100 mg total) by mouth daily as needed for erectile dysfunction. 30 tablet 1   tamsulosin (FLOMAX) 0.4 MG CAPS capsule Take 0.4 mg by mouth daily.     aspirin EC 81 MG tablet Take 81 mg by mouth daily. (Patient not taking: Reported on 03/21/2023)     gabapentin (NEURONTIN) 100 MG capsule Take 100 mg by mouth 2 (two) times daily. (Patient not taking: Reported on 03/21/2023)     omeprazole (PRILOSEC) 40 MG capsule Take 1 capsule (40 mg total) by mouth 2 (two) times daily. (Patient not taking: Reported on 03/27/2023) 60 capsule 11   No current facility-administered medications for this visit.    Allergies   Allergies as of 03/27/2023   (No Known Allergies)       Review of Systems   General: Negative for anorexia, weight loss, fever, chills, fatigue, weakness. See hpi ENT: Negative for hoarseness, difficulty swallowing , nasal congestion. CV: Negative for chest pain, angina, palpitations, dyspnea on exertion, peripheral edema.  Respiratory: Negative for dyspnea at rest, dyspnea on exertion, cough, sputum, wheezing.  GI: See history of present illness. GU:  Negative for dysuria, hematuria, urinary incontinence, urinary frequency, nocturnal urination.  Endo: Negative for unusual weight change.     Physical Exam   BP 138/82 (BP Location: Right Arm, Patient Position: Sitting, Cuff Size: Normal)   Pulse 65   Temp 97.8 F (36.6 C) (Temporal)   Ht 5\' 6"  (1.676 m)   Wt 154 lb 12.8 oz (70.2 kg)   SpO2 95%   BMI 24.99 kg/m    General: Well-nourished, well-developed in no acute distress.  Eyes: No icterus. Mouth: Oropharyngeal mucosa moist and pink ,  Lungs: Clear to auscultation bilaterally.  Heart: Regular rate and rhythm, no murmurs rubs or gallops.  Abdomen: Bowel sounds are normal, nontender, nondistended, no hepatosplenomegaly or masses,  no abdominal bruits or hernia , no rebound or guarding.  Rectal: not performed  Extremities: No lower extremity edema. No  clubbing or deformities. Neuro: Alert and oriented x 4   Skin: Warm and dry, no jaundice.   Psych: Alert and cooperative, normal mood and affect.  Labs   Lab Results  Component Value Date   CREATININE 0.81 02/23/2023   BUN 9 02/23/2023   NA 128 (L) 02/23/2023   K 3.6 02/23/2023   CL 97 (L) 02/23/2023   CO2 23 02/23/2023   Lab Results  Component Value Date   ALT 22 02/23/2023   AST 19 02/23/2023   ALKPHOS 73 02/23/2023   BILITOT 0.9 02/23/2023   Lab Results  Component Value Date   WBC 6.8 02/23/2023   HGB 13.8 02/23/2023   HCT 40.1 02/23/2023   MCV 84.8 02/23/2023   PLT 148 (L) 02/23/2023   .lastafp Imaging Studies   MR LUMBAR SPINE WO CONTRAST  Result Date: 03/13/2023  [facility] [address], [address] [place], [place] [address] [phone] NEUROIMAGING REPORT STUDY DATE: 03/13/2023 PATIENT NAME: ELIJAHA GOLDSON DOB: April 21, 1956 MRN: [id number] ORDERING CLINICIAN: Dr. Marjory Lies CLINICAL HISTORY: 67 year old patient being  evaluated for gait abnormality and spinal stenosis COMPARISON FILMS: MRI lumbar spine report 11/01/2010 EXAM: MRI lumbar spine without contrast TECHNIQUE: Sagittal T1, T2, STIR and axial T2 and axial medic images were obtained through the lumbar spine CONTRAST: None IMAGING SITE: [place] [facility] FINDINGS: The lumbar vertebrae demonstrate slight loss of forward lordotic curvature but normal body height and mild endplate marrow degenerative changes at L3-4 and L4-5. L1-L2 shows mild disc signal change and prominent facet hypertrophy with mild posterior canal and right greater than left mild foraminal narrowing with no significant compression. L2-3 shows prominent disc osteophyte protrusion laterally along with ligamentum flavum and facet hypertrophy resulting in severe bilateral foraminal and mild posterior canal narrowing.  There does not appear to be definite nerve root entrapment. L3-4 also shows prominent osteophyte protrusion laterally along the  ligamentum flavum and facet hypertrophy resulting in mild posterior canal and severe bilateral foraminal narrowing possible encroachment on the exiting nerve root on the right. L4-5 also shows prominent disc osteophyte protrusion along with ligamentum flavum and facet hypertrophy resulting in moderate posterior canal and severe right greater than left foraminal narrowing with possible encroachment on the exiting nerve root on the right. L5-S1 also shows prominent disc osteophyte protrusion laterally along with severe asymmetric facet hypertrophy right greater than left resulting in severe right greater than left foraminal narrowing possible encroachment on the nerve root on the right.  Conus medullaris terminates at L1 lower border.  Visualized paraspinal soft tissue appear normal.  There are small benign simple cyst noted in the superior pole of the left kidney.   MRI lumbar spine without contrast showing prominent spondylitic changes from L2-S1 most prominent at L4-5 where there is moderate posterior canal and severe right greater than left foraminal narrowing with possible encroachment on exiting nerve root on the right.  L2-3, L3-4 and L5-S1 also show mild posterior canal and severe bilateral right greater than left foraminal narrowing. INTERPRETING PHYSICIAN: PRAMOD SETHI, MD Certified in  Neuroimaging by American Society of Neuroimaging and [employer] for Neurological Subspecialities  MR CERVICAL SPINE WO CONTRAST  Result Date: 03/13/2023  [facility] [address] [place], [place] [address] [phone] NEUROIMAGING REPORT STUDY DATE: 03/13/2023 PATIENT NAME: GAMBLE KNICELEY DOB: 1956-04-05 MRN: [id number] ORDERING CLINICIAN: Dr. Marjory Lies CLINICAL HISTORY: 67 year old patient being evaluated for gait difficulty and cervical spine stenosis COMPARISON FILMS: None available EXAM: MRI cervical spine without contrast TECHNIQUE: Sagittal T1, T2, STIR and axial T2 and axial medic  images were obtained through cervical spine CONTRAST: None IMAGING SITE: [place] [facility] FINDINGS: The cervical vertebrae demonstrate loss of forward lordotic curvature and diffuse loss of marrow signal with prominent endplate marrow degenerative changes through the anterior and posterior osteophytes from C3-C7 with loss of disc height. C2-3 shows loss of disc height along with diffuse disc bulge and hypertrophy of ligamentum flavum resulting in mild posterior canal and left greater than right foraminal narrowing. C3-4 shows prominent disc osteophyte protrusion centrally and to the left along with prominent ligamentum flavum hypertrophy and asymmetric facet hypertrophy on the left resulting in severe canal and left-sided foraminal narrowing with possible encroachment on exiting nerve root.  The spinal cord is flattened and thinned out. C4-5 shows also loss of disc height and prominent disc osteophyte protrusion along with mild ligamentum flavum hypertrophy and severe facet hypertrophy left greater than right resulting in mild canal and severe left-sided and moderate right-sided foraminal narrowing. C5-6 shows also prominent disc osteophyte bulge along with endplate marrow degenerative  changes and mild facet and ligamentum flavum hypertrophy resulting in mild canal narrowing. C6-7 shows mild diffuse disc bulge along with mild ligamentum flavum and facet acquired hypertrophy with no significant compression.  The spinal cord parenchyma shows mild hyperintensities from C3 to see 7 and appears to be atrophied and small caliber throughout.  The visualized upper thoracic vertebrae also show prominent disc degenerative changes.  Visualized portion of the lower brainstem and craniovertebral junction appear unremarkable.  Paraspinal soft tissues show no abnormalities.   MRI scan cervical spine without contrast showing prominent spondylitic changes from C3-C7 most severe at C3-4 where there is severe canal and left  greater than right foraminal narrowing with atrophy of the cord.  There is also mild canal and foraminal narrowing at C 3- 4, C5-6 and C6-7.  The spinal cord appears to be atrophied throughout. INTERPRETING PHYSICIAN: Delia Heady, MD Certified in  Neuroimaging by American Society of Neuroimaging and [employer] for Neurological Subspecialities   Assessment   Hyponatremia: overdue for follow up labs. Appears asymptomatic. Etiology unclear. Cirrhosis well compensated making liver unlikely source for low sodium especially with rather acute change.   Thyroid disease: waiting endocrinology consultation. PCP started on methimazole. Weight loss has stabilized. Update labs pending endo appt per patient request.   Cirrhosis: h/o HCV genotype 1a s/p 12 weeks Epclusa. Likely early stage cirrhosis by elastography. Imaging and labs up to date. Will update labs in 3 months for cirrhosis, HCV RNA.   GERD: controlled  Pancreatic tail cystic lesion: surveillance MRI planned in 06/2023.   Pancreas divisum: asymptomatic.   Weight loss: stabilized.  PLAN   Update labs.  Encouraged him to follow up with PCP regarding his concerns for waiting to see endocrinology.  Request he call with accurate medication list.  Patient wants both him and his sister Malachi Paradise to be called with results etc. She assists him to appointments.   Leanna Battles. Melvyn Neth, MHS, PA-C [facility]

## 2023-03-27 ENCOUNTER — Ambulatory Visit (INDEPENDENT_AMBULATORY_CARE_PROVIDER_SITE_OTHER): Payer: Medicaid Other | Admitting: Gastroenterology

## 2023-03-27 ENCOUNTER — Telehealth: Payer: Self-pay | Admitting: Neurology

## 2023-03-27 ENCOUNTER — Other Ambulatory Visit (HOSPITAL_COMMUNITY)
Admission: RE | Admit: 2023-03-27 | Discharge: 2023-03-27 | Disposition: A | Discharge status: Home / Self Care | Payer: Medicaid Other | Source: Ambulatory Visit | Attending: Gastroenterology | Admitting: Gastroenterology

## 2023-03-27 ENCOUNTER — Encounter: Payer: Self-pay | Admitting: Gastroenterology

## 2023-03-27 VITALS — BP 138/82 | HR 65 | Temp 97.8°F | Ht 66.0 in | Wt 154.8 lb

## 2023-03-27 DIAGNOSIS — E871 Hypo-osmolality and hyponatremia: Secondary | ICD-10-CM

## 2023-03-27 DIAGNOSIS — K219 Gastro-esophageal reflux disease without esophagitis: Secondary | ICD-10-CM

## 2023-03-27 DIAGNOSIS — E0590 Thyrotoxicosis, unspecified without thyrotoxic crisis or storm: Secondary | ICD-10-CM | POA: Diagnosis not present

## 2023-03-27 LAB — BASIC METABOLIC PANEL
Anion gap: 5 (ref 5–15)
BUN: 15 mg/dL (ref 8–23)
CO2: 22 mmol/L (ref 22–32)
Calcium: 9 mg/dL (ref 8.9–10.3)
Chloride: 101 mmol/L (ref 98–111)
Creatinine, Ser: 0.91 mg/dL (ref 0.61–1.24)
GFR, Estimated: >60 mL/min (ref >60)
Glucose, Bld: 104 mg/dL — ABNORMAL HIGH (ref 70–99)
Potassium: 4 mmol/L (ref 3.5–5.1)
Sodium: 128 mmol/L — ABNORMAL LOW (ref 135–145)

## 2023-03-27 LAB — T4, FREE: Free T4: 1.07 ng/dL (ref 0.61–1.12)

## 2023-03-27 LAB — TSH: TSH: 0.026 u[IU]/mL — ABNORMAL LOW (ref 0.350–4.500)

## 2023-03-27 NOTE — Telephone Encounter (Signed)
-----   Message from Suanne Marker, MD sent at 03/24/2023  9:31 AM EDT ----- Lumbar spinal stenosis at L4-5. Follow up with neurosurgery consult. -VRP

## 2023-03-27 NOTE — Telephone Encounter (Signed)
Called the patient and was able to discuss the results from the MRI cervical spine and lumbar spine. Advised of the stenosis in both areas. Informed the patient the next steps would be neuro surgery consult. Advised the referral has been placed. Pt verbalized understanding. Pt had no questions at this time but was encouraged to call back if questions arise. Pt did have me repeat the information to his sister who also verbalized understanding.  Cervical MRI- "Severe spinal stenosis, worse at C3-4. Recommend neurosurgery consult"

## 2023-03-27 NOTE — Patient Instructions (Addendum)
Please go to [facility] at [address] [facility] today for labs. Please call your primary care and have them reach out to endocrinologist if you would like to try to be seen earlier.  We will be in touch with results as available.  Please call with accurate medication list.

## 2023-03-28 LAB — T3, FREE: T3, Free: 2.9 pg/mL (ref 2.0–4.4)

## 2023-03-31 ENCOUNTER — Other Ambulatory Visit: Payer: Self-pay

## 2023-03-31 DIAGNOSIS — K7460 Unspecified cirrhosis of liver: Secondary | ICD-10-CM

## 2023-04-04 ENCOUNTER — Other Ambulatory Visit: Payer: Medicaid Other

## 2023-04-04 NOTE — Patient Outreach (Signed)
Medicaid Managed Care Social Work Note  04/04/2023 Name:  LARENCE Oconnor MRN:  [id number] DOB:  09-15-1956  Kirk Oconnor is an 67 y.o. year old male who is a primary patient of Kirk Nigel Berthold, FNP.  The [facility] Managed Care Coordination team was consulted for assistance with:   housing  Kirk Oconnor was given information about Medicaid Managed Care Coordination team services today. Kirk Oconnor Patient agreed to services and verbal consent obtained.  Engaged with patient  for by telephone forfollow up visit in response to referral for case management and/or care coordination services.   Assessments/Interventions:  Review of past medical history, allergies, medications, health status, including review of consultants reports, laboratory and other test data, was performed as part of comprehensive evaluation and provision of chronic care management services.  SDOH: (Social Determinant of Health) assessments and interventions performed: SDOH Interventions    Flowsheet Row Patient Outreach Telephone from 03/21/2023 in [facility] Office Visit from 03/01/2023 in [facility] [place] Office Visit from 01/20/2023 in [facility]  SDOH Interventions     Food Insecurity Interventions Intervention Not Indicated -- --  Housing Interventions Other (Comment)  [Patient is scheduled with BSW on 03/23/23] AMB Referral --  Transportation Interventions Intervention Not Indicated -- --  Utilities Interventions Intervention Not Indicated -- --  Depression Interventions/Treatment  -- -- Counseling      BSW completed a telephone outreach with patient and his sister. Patient was able to locate somewhere else to live. No resources are needed at this time.  Advanced Directives Status:  Not addressed in this encounter.  Care Plan                 No Known Allergies  Medications Reviewed Today     Reviewed by Kirk Oconnor, CMA (Certified  Medical Assistant) on 03/27/23 at 1534  Med List Status: <None>   Medication Order Taking? Sig Documenting Provider Last Dose Status Informant  acetaminophen (TYLENOL) 500 MG tablet [id number] Yes Take 500 mg by mouth every 6 (six) hours as needed. [provider] Taking Active   amLODipine (NORVASC) 10 MG tablet [id number] Yes Take 1 tablet (10 mg total) by mouth daily. Smoot, Freda Jackson Taking Active   aspirin EC 81 MG tablet [id number] Yes Take 81 mg by mouth daily. [provider] Taking Active Family Member  methimazole (TAPAZOLE) 10 MG tablet [id number] Yes Take 1 tablet by mouth twice daily Kirk Newman Nip, [place], FNP Taking Active   omeprazole (PRILOSEC) 40 MG capsule [id number] Yes Take 1 capsule (40 mg total) by mouth 2 (two) times daily. Lanelle Bal, DO Taking Active            Med Note Ephriam Knuckles, Kirk Oconnor   Mon Mar 27, 2023  3:32 PM)    sildenafil (VIAGRA) 100 MG tablet [id number] Yes Take 1 tablet (100 mg total) by mouth daily as needed for erectile dysfunction. Kirk Falls, FNP Taking Active   tamsulosin [facility]) 0.4 MG CAPS capsule [id number] Yes Take 0.4 mg by mouth daily. [provider] Taking Active             Patient Active Problem List   Diagnosis Date Noted   Hyponatremia 03/27/2023   Overactive thyroid gland 03/27/2023   Erectile dysfunction 02/28/2023   Type 2 diabetes mellitus (HCC) 01/20/2023   Nocturia 01/18/2023   Unexplained weight loss 01/18/2023   Hepatic cirrhosis (HCC) 07/20/2022   Hyperglycemia  07/11/2017   Thrombocytopenia (HCC) 07/11/2017   Chronic back pain 06/26/2017   COPD (chronic obstructive pulmonary disease) (HCC) 04/14/2017   CVA (cerebral vascular accident) (HCC) 04/14/2017   History of alcohol abuse 04/14/2017   History of cocaine abuse (HCC) 04/14/2017   OSA (obstructive sleep apnea) 04/14/2017   BPH (benign prostatic hyperplasia) 10/22/2016   GERD (gastroesophageal reflux disease) 10/22/2016   Aortic  root dilation (HCC) 10/21/2016   Acute diastolic CHF (congestive heart failure) (HCC) 10/21/2016   Essential hypertension, benign 03/27/2013   Hepatitis C 03/27/2013   Snoring 03/27/2013    Conditions to be addressed/monitored per PCP order:   housing   There are no care plans that you recently modified to display for this patient.   Follow up:  Patient agrees to Care Plan and Follow-up.  Plan: The  Patient has been provided with contact information for the Managed Medicaid care management team and has been advised to call with any health related questions or concerns.    Kirk Oconnor, MHA [facility]  Managed [facility] Social Worker [phone]

## 2023-04-04 NOTE — Patient Instructions (Signed)
Visit Information  Kirk Oconnor was given information about Medicaid Managed Care team care coordination services as a part of their [facility] Community [facility] Medicaid benefit. [name] verbally consented to engagement with the [facility] Managed Care team.   If you are experiencing a medical emergency, please call 911 or report to your local emergency department or urgent care.   If you have a non-emergency medical problem during routine business hours, please contact your provider's office and ask to speak with a nurse.   For questions related to your [facility], please call: [phone] or visit the homepage here: [email or web address]  If you would like to schedule transportation through your [facility], please call the following number at least 2 days in advance of your appointment: [phone]   Rides for urgent appointments can also be made after hours by calling Member Services.  Call the Behavioral Health Crisis Line at [phone], at any time, 24 hours a day, 7 days a week. If you are in danger or need immediate medical attention call 911.  If you would like help to quit smoking, call 1-800-QUIT-NOW (2172079620) OR Espaol: 1-855-Djelo-Ya (1-324-401-0272) o para ms informacin haga clic aqu or Text READY to 536-644 to register via text  [name] - following are the goals we discussed in your visit today:   Goals Addressed   None       The  Patient                                              has been provided with contact information for the Managed Medicaid care management team and has been advised to call with any health related questions or concerns.   [name], [name], MHA [facility]  Managed Medicaid Social Worker [phone]   Following is a copy of your plan of care:  There are no care plans that you recently modified to display for  this patient.

## 2023-04-05 ENCOUNTER — Encounter: Payer: Self-pay | Admitting: "Endocrinology

## 2023-04-05 ENCOUNTER — Ambulatory Visit (INDEPENDENT_AMBULATORY_CARE_PROVIDER_SITE_OTHER): Payer: Medicaid Other | Admitting: "Endocrinology

## 2023-04-05 ENCOUNTER — Other Ambulatory Visit (HOSPITAL_COMMUNITY)
Admission: RE | Admit: 2023-04-05 | Discharge: 2023-04-05 | Disposition: A | Discharge status: Home / Self Care | Payer: Medicaid Other | Source: Ambulatory Visit | Attending: "Endocrinology | Admitting: "Endocrinology

## 2023-04-05 VITALS — BP 120/84 | HR 72 | Ht 66.0 in | Wt 155.6 lb

## 2023-04-05 DIAGNOSIS — I10 Essential (primary) hypertension: Secondary | ICD-10-CM | POA: Diagnosis not present

## 2023-04-05 DIAGNOSIS — F17200 Nicotine dependence, unspecified, uncomplicated: Secondary | ICD-10-CM

## 2023-04-05 DIAGNOSIS — E0590 Thyrotoxicosis, unspecified without thyrotoxic crisis or storm: Secondary | ICD-10-CM | POA: Insufficient documentation

## 2023-04-05 DIAGNOSIS — E1165 Type 2 diabetes mellitus with hyperglycemia: Secondary | ICD-10-CM

## 2023-04-05 DIAGNOSIS — Z794 Long term (current) use of insulin: Secondary | ICD-10-CM

## 2023-04-05 LAB — TSH: TSH: 0.075 u[IU]/mL — ABNORMAL LOW (ref 0.350–4.500)

## 2023-04-05 LAB — T4, FREE: Free T4: 0.86 ng/dL (ref 0.61–1.12)

## 2023-04-05 MED ORDER — METHIMAZOLE 10 MG PO TABS: 10.0000 mg | ORAL_TABLET | Freq: Every day | ORAL | 0 refills | Status: DC

## 2023-04-05 NOTE — Progress Notes (Signed)
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   Arthritis    CAD (coronary artery disease)    Nonobstructive at cardiac catheterization July 2018 - [facility]   Carpal tunnel syndrome    Depression    Diabetes mellitus without complication (HCC)    Essential hypertension    GERD (gastroesophageal reflux disease)    Hepatitis C    Sleep apnea    Past Surgical History:  Procedure Laterality Date   AORTIC VALVE REPLACEMENT  03/2017   [facility]   BIOPSY  12/15/2022   Procedure: BIOPSY;  Surgeon: Lanelle Bal, DO;  [facility];  Service: Endoscopy;;   COLONOSCOPY WITH PROPOFOL N/A 12/15/2022   Procedure: COLONOSCOPY WITH PROPOFOL;  Surgeon: Lanelle Bal, DO;  [facility];  Service: Endoscopy;  Laterality: N/A;  10:00 am, asa 3   ESOPHAGOGASTRODUODENOSCOPY (EGD) WITH PROPOFOL N/A 12/15/2022   Procedure: ESOPHAGOGASTRODUODENOSCOPY (EGD) WITH PROPOFOL;  Surgeon: Lanelle Bal, DO;  [facility];  Service: Endoscopy;  Laterality: N/A;   POLYPECTOMY  12/15/2022   Procedure: POLYPECTOMY;  Surgeon: Lanelle Bal, DO;  [facility];  Service: Endoscopy;;   Social History   Socioeconomic History   Marital status: Divorced    Spouse name: Not on file   Number of children: Not on file   Years of education: Not on file   Highest education level: Not on file  Occupational History   Not on file  Tobacco Use   Smoking status: Some Days    Types: Cigarettes   Smokeless  tobacco: Never  Vaping Use   Vaping Use: Never used  Substance and Sexual Activity   Alcohol use: Yes    Comment: socially   Drug use: No   Sexual activity: Yes  Other Topics Concern   Not on file  Social History Narrative   Not on file   Social Determinants of Health   Financial Resource Strain: Not on file  Food Insecurity: No Food Insecurity (03/21/2023)   Hunger Vital Sign    Worried About Running Out of Food in the Last Year: Never true    Ran Out of Food in the Last Year: Never true  Transportation Needs: No Transportation Needs (03/21/2023)   PRAPARE - Administrator, Civil Service (Medical): No    Lack of Transportation (Non-Medical): No  Physical Activity: Not on file  Stress: Not on file  Social Connections: Not on file   Family History  Problem Relation Age of Onset   Diabetes Mother    Heart attack Mother    Heart attack Father    Outpatient Encounter Medications as of 04/05/2023  Medication Sig   acetaminophen (TYLENOL) 500 MG tablet Take 500 mg by  mouth every 6 (six) hours as needed.   amLODipine (NORVASC) 10 MG tablet Take 1 tablet (10 mg total) by mouth daily.   aspirin EC 81 MG tablet Take 81 mg by mouth daily. (Patient not taking: Reported on 04/05/2023)   methimazole (TAPAZOLE) 10 MG tablet Take 1 tablet (10 mg total) by mouth daily with breakfast.   omeprazole (PRILOSEC) 40 MG capsule Take 1 capsule (40 mg total) by mouth 2 (two) times daily. (Patient not taking: Reported on 04/05/2023)   sildenafil (VIAGRA) 100 MG tablet Take 1 tablet (100 mg total) by mouth daily as needed for erectile dysfunction.   tamsulosin (FLOMAX) 0.4 MG CAPS capsule Take 0.4 mg by mouth daily.   [DISCONTINUED] methimazole (TAPAZOLE) 10 MG tablet Take 1 tablet by mouth twice daily   No facility-administered encounter medications on file as of 04/05/2023.   ALLERGIES: No Known Allergies  VACCINATION STATUS: Immunization History  Administered Date(s) Administered    Td 03/04/2019    HPI Kirk Oconnor is 67 y.o. male who presents today with a medical history as above. he is being seen in consultation for hyperthyroidism requested by Del Nigel Berthold, FNP.   Patient is not an optimal historian.  It is mostly obtained from chart review.  He was diagnosed with hypothyroidism in April 2024.  He presented with 40 pounds of weight loss, palpitations, tremors, and heat intolerance.  He was subsequently put on methimazole 10 mg p.o. twice daily with biochemical response. She reports that he has seen some clinical symptom improvement as well.  He is major concern is that he thinks the treatment is taking too long for him to get back to his usual state of health. He denies family history of thyroid dysfunction.  His other medical problems include hypertension on treatment with amlodipine.  His records also show type 2 diabetes with A1c ranging between 6.6% and 7.1%.  He is currently not on treatment.  He denies dysphagia, shortness of breath, nor voice change.  He denies any personal history of goiter. Patient is chronic smoker.  Review of Systems  Constitutional: + Lost 40 pounds prior to initiation of treatment with methimazole, presents with more or less steady weight gain.  + fatigue, no subjective hyperthermia, no subjective hypothermia Eyes: no blurry vision, no xerophthalmia ENT: no sore throat, no nodules palpated in throat, no dysphagia/odynophagia, no hoarseness Cardiovascular: no Chest Pain, no Shortness of Breath, no palpitations, no leg swelling Respiratory: no cough, no shortness of breath Gastrointestinal: no Nausea/Vomiting/Diarhhea Musculoskeletal: no muscle/joint aches Skin: no rashes Neurological: no tremors, no numbness, no tingling, no dizziness Psychiatric: no depression, no anxiety  Objective:       04/05/2023   11:25 AM 03/27/2023   11:39 AM 03/03/2023    8:30 AM  Vitals with BMI  Height 5\' 6"  5\' 6"  5\' 6"   Weight 155 lbs 10 oz  154 lbs 13 oz 152 lbs 3 oz  BMI 25.13 25 24.58  Systolic 120 138 098  Diastolic 84 82 79  Pulse 72 65 77    BP 120/84   Pulse 72   Ht 5\' 6"  (1.676 m)   Wt 155 lb 9.6 oz (70.6 kg)   BMI 25.11 kg/m   Wt Readings from Last 3 Encounters:  04/05/23 155 lb 9.6 oz (70.6 kg)  03/27/23 154 lb 12.8 oz (70.2 kg)  03/03/23 152 lb 3.2 oz (69 kg)    Physical Exam  Constitutional:  Body mass index is 25.11 kg/m.,  not in acute distress, normal state of mind Eyes: PERRLA, EOMI, no exophthalmos ENT: moist mucous membranes, no gross thyromegaly, no gross cervical lymphadenopathy Cardiovascular: normal precordial activity, Regular Rate and Rhythm, no Murmur/Rubs/Gallops Respiratory:  adequate breathing efforts, no gross chest deformity, Clear to auscultation bilaterally Gastrointestinal: abdomen soft, Non -tender, No distension, Bowel Sounds present, no gross organomegaly Musculoskeletal: no gross deformities, strength intact in all four extremities, no peripheral edema Skin: moist, warm, no rashes Neurological: no tremor with outstretched hands, Deep tendon reflexes normal in bilateral lower extremities.  CMP ( most recent) CMP     Component Value Date/Time   NA 128 (L) 03/27/2023 1247   NA 139 06/26/2017 0908   K 4.0 03/27/2023 1247   CL 101 03/27/2023 1247   CO2 22 03/27/2023 1247   GLUCOSE 104 (H) 03/27/2023 1247   BUN 15 03/27/2023 1247   BUN 11 06/26/2017 0908   CREATININE 0.91 03/27/2023 1247   CREATININE 0.98 03/11/2013 1036   CALCIUM 9.0 03/27/2023 1247   PROT 7.3 02/23/2023 1130   PROT 7.1 06/26/2017 0908   ALBUMIN 3.5 02/23/2023 1130   ALBUMIN 3.8 06/26/2017 0908   AST 19 02/23/2023 1130   ALT 22 02/23/2023 1130   ALKPHOS 73 02/23/2023 1130   BILITOT 0.9 02/23/2023 1130   BILITOT 0.3 06/26/2017 0908   GFRNONAA >60 03/27/2023 1247   GFRNONAA 87 03/11/2013 1036     Diabetic Labs (most recent): Lab Results  Component Value Date   HGBA1C 6.6 (H) 11/22/2022   HGBA1C  7.1 (H) 01/27/2022   HGBA1C 5.3 06/14/2017   MICROALBUR 6.0 (H) 01/27/2022     Lipid Panel ( most recent) Lipid Panel     Component Value Date/Time   CHOL 149 01/05/2023 1146   TRIG 92 01/05/2023 1146   HDL 31 (L) 01/05/2023 1146   CHOLHDL 4.8 01/05/2023 1146   VLDL 18 01/05/2023 1146   LDLCALC 100 (H) 01/05/2023 1146      Lab Results  Component Value Date   TSH 0.075 (L) 04/05/2023   TSH 0.026 (L) 03/27/2023   TSH <0.005 (L) 01/30/2023   TSH <0.005 (L) 01/20/2023   TSH 1.653 10/21/2016   FREET4 1.07 03/27/2023   FREET4 1.76 01/30/2023   FREET4 2.14 (H) 01/20/2023       Assessment & Plan:   1. Hyperthyroidism  - Kirk Oconnor  is being seen at a kind request of Del Newman Nip, Tenna Child, FNP. - I have reviewed his available records and clinically evaluated the patient. - Based on these reviews, he has hyperthyroidism. He was appropriately initiated on antithyroid medications while waiting for thyroid uptake and scan. I advised him to lower his methimazole to 10 mg p.o. daily at breakfast with plan to repeat thyroid function test before he would return in 5 weeks.  After he is controlled to near target levels, he will be considered for thyroid uptake and scan and subsequent radioactive iodine thyroid ablation if necessary.  His recent weight loss is probably helping him control his diabetes, most recent A1c of 6.6%.  He will not be initiated on medications.  He is advised to continue his amlodipine 10 mg p.o. daily for hypertension. This patient's major has concerning his smoking. The patient was counseled on the dangers of tobacco use, and was advised to quit.  Reviewed strategies to maximize success, including removing cigarettes and smoking materials from environment.   - he is advised to maintain close follow up with Del Nigel Berthold,  FNP for primary care needs.   -Thank you for involving me in the care of this pleasant patient.  Time spent with the  patient: 45  minutes, of which >50% was spent in  counseling him about his hyperthyroidism, type 2 diabetes, hypertension, smoking and the rest in obtaining information about his symptoms, reviewing his previous labs/studies ( including abstractions from other facilities),  evaluations, and treatments,  and developing a plan to confirm diagnosis and long term treatment based on the latest standards of care/guidelines; and documenting his care.  Kirk Oconnor participated in the discussions, expressed understanding, and voiced agreement with the above plans.  All questions were answered to his satisfaction. he is encouraged to contact clinic should he have any questions or concerns prior to his return visit.  Follow up plan: Return in about 5 weeks (around 05/10/2023) for F/U with Pre-visit Labs.   Marquis Lunch, MD [facility] [address] [place], [place] [address] Phone: [phone]  Fax: [phone]     04/05/2023, 1:07 PM  This note was partially dictated with voice recognition software. Similar sounding words can be transcribed inadequately or may not  be corrected upon review.

## 2023-04-06 LAB — THYROID PEROXIDASE ANTIBODY: Thyroperoxidase Ab SerPl-aCnc: 150 IU/mL — ABNORMAL HIGH (ref 0–34)

## 2023-04-06 LAB — T3, FREE: T3, Free: 2.5 pg/mL (ref 2.0–4.4)

## 2023-04-07 LAB — THYROGLOBULIN ANTIBODY: Thyroglobulin Antibody: <1 IU/mL (ref 0.0–0.9)

## 2023-04-21 ENCOUNTER — Ambulatory Visit: Payer: Medicaid Other | Admitting: Family Medicine

## 2023-04-25 ENCOUNTER — Ambulatory Visit (INDEPENDENT_AMBULATORY_CARE_PROVIDER_SITE_OTHER): Payer: Medicaid Other | Admitting: Family Medicine

## 2023-04-25 ENCOUNTER — Encounter: Payer: Self-pay | Admitting: Family Medicine

## 2023-04-25 VITALS — BP 146/98 | HR 69 | Ht 66.0 in | Wt 157.0 lb

## 2023-04-25 DIAGNOSIS — I10 Essential (primary) hypertension: Secondary | ICD-10-CM | POA: Diagnosis not present

## 2023-04-25 DIAGNOSIS — M545 Low back pain, unspecified: Secondary | ICD-10-CM | POA: Insufficient documentation

## 2023-04-25 DIAGNOSIS — M549 Dorsalgia, unspecified: Secondary | ICD-10-CM | POA: Insufficient documentation

## 2023-04-25 DIAGNOSIS — Z125 Encounter for screening for malignant neoplasm of prostate: Secondary | ICD-10-CM

## 2023-04-25 DIAGNOSIS — E119 Type 2 diabetes mellitus without complications: Secondary | ICD-10-CM | POA: Diagnosis not present

## 2023-04-25 DIAGNOSIS — E782 Mixed hyperlipidemia: Secondary | ICD-10-CM

## 2023-04-25 MED ORDER — GABAPENTIN 300 MG PO CAPS: 300.0000 mg | ORAL_CAPSULE | Freq: Three times a day (TID) | ORAL | 3 refills | Status: DC | PRN

## 2023-04-25 MED ORDER — TIZANIDINE HCL 4 MG PO CAPS: 4.0000 mg | ORAL_CAPSULE | Freq: Two times a day (BID) | ORAL | 3 refills | Status: DC | PRN

## 2023-04-25 NOTE — Patient Instructions (Signed)

## 2023-04-25 NOTE — Assessment & Plan Note (Signed)
Vitals:   04/25/23 1122 04/25/23 1124  BP: (!) 168/100 (!) 146/98  Blood pressure not controlled in today visit Continue amlodipine 10 mg once daily Follow up in 1 week with at home blood pressure reading Continued discussion on DASH diet, low sodium diet and maintain a exercise routine for 150 minutes per week.

## 2023-04-25 NOTE — Progress Notes (Signed)
Patient Office Visit   Subjective   Patient ID: Kirk Oconnor, male    DOB: 1956-01-07  Age: 67 y.o. MRN: [id number]  CC:  Chief Complaint  Patient presents with   Follow-up    Patient is here for chronic f/u. No changes or concerns since last visit.     HPI Kirk Oconnor 67 year old male, presents to the clinic for back pain. He  has a past medical history of Anxiety, Aortic regurgitation (03/2017), Aortic root aneurysm (HCC) (03/2017), Arthritis, CAD (coronary artery disease), Carpal tunnel syndrome, Depression, Diabetes mellitus without complication (HCC), Essential hypertension, GERD (gastroesophageal reflux disease), Hepatitis C, and Sleep apnea.  Back Pain This is a new problem. The problem occurs constantly. The pain is present in the lumbar spine. The quality of the pain is described as aching and burning. The pain is at a severity of 10/10. The pain is The same all the time. The symptoms are aggravated by position, bending and standing. Stiffness is present All day. Pertinent negatives include no bladder incontinence, bowel incontinence, chest pain, fever, leg pain, numbness or pelvic pain. Risk factors include lack of exercise. He has tried analgesics for the symptoms. The treatment provided no relief.      Outpatient Encounter Medications as of 04/25/2023  Medication Sig   acetaminophen (TYLENOL) 500 MG tablet Take 500 mg by mouth every 6 (six) hours as needed.   amLODipine (NORVASC) 10 MG tablet Take 1 tablet (10 mg total) by mouth daily.   gabapentin (NEURONTIN) 300 MG capsule Take 1 capsule (300 mg total) by mouth every 8 (eight) hours as needed.   methimazole (TAPAZOLE) 10 MG tablet Take 1 tablet (10 mg total) by mouth daily with breakfast.   sildenafil (VIAGRA) 100 MG tablet Take 1 tablet (100 mg total) by mouth daily as needed for erectile dysfunction.   tamsulosin (FLOMAX) 0.4 MG CAPS capsule Take 0.4 mg by mouth daily.   tiZANidine (ZANAFLEX) 4 MG capsule Take 1  capsule (4 mg total) by mouth every 12 (twelve) hours as needed for muscle spasms.   aspirin EC 81 MG tablet Take 81 mg by mouth daily. (Patient not taking: Reported on 04/05/2023)   omeprazole (PRILOSEC) 40 MG capsule Take 1 capsule (40 mg total) by mouth 2 (two) times daily. (Patient not taking: Reported on 04/05/2023)   No facility-administered encounter medications on file as of 04/25/2023.    Past Surgical History:  Procedure Laterality Date   AORTIC VALVE REPLACEMENT  03/2017   [facility]   BIOPSY  12/15/2022   Procedure: BIOPSY;  Surgeon: Lanelle Bal, DO;  [facility];  Service: Endoscopy;;   COLONOSCOPY WITH PROPOFOL N/A 12/15/2022   Procedure: COLONOSCOPY WITH PROPOFOL;  Surgeon: Lanelle Bal, DO;  [facility];  Service: Endoscopy;  Laterality: N/A;  10:00 am, asa 3   ESOPHAGOGASTRODUODENOSCOPY (EGD) WITH PROPOFOL N/A 12/15/2022   Procedure: ESOPHAGOGASTRODUODENOSCOPY (EGD) WITH PROPOFOL;  Surgeon: Lanelle Bal, DO;  [facility];  Service: Endoscopy;  Laterality: N/A;   POLYPECTOMY  12/15/2022   Procedure: POLYPECTOMY;  Surgeon: Lanelle Bal, DO;  [facility];  Service: Endoscopy;;    Review of Systems  Constitutional:  Negative for chills and fever.  Eyes:  Negative for blurred vision.  Respiratory:  Negative for shortness of breath.   Cardiovascular:  Negative for chest pain.  Gastrointestinal:  Negative for bowel incontinence.  Genitourinary:  Negative for bladder incontinence and pelvic  pain.  Musculoskeletal:  Positive for back pain and myalgias. Negative for falls.  Neurological:  Negative for numbness.      Objective    BP (!) 146/98   Pulse 69   Ht 5\' 6"  (1.676 m)   Wt 157 lb (71.2 kg)   SpO2 96%   BMI 25.34 kg/m   Physical Exam Vitals reviewed.  Constitutional:      General: He is not in acute distress.    Appearance: Normal appearance. He is not ill-appearing, toxic-appearing or  diaphoretic.  HENT:     Head: Normocephalic.  Eyes:     General:        Right eye: No discharge.        Left eye: No discharge.     Conjunctiva/sclera: Conjunctivae normal.  Cardiovascular:     Rate and Rhythm: Normal rate.     Pulses: Normal pulses.     Heart sounds: Normal heart sounds.  Pulmonary:     Effort: Pulmonary effort is normal. No respiratory distress.     Breath sounds: Normal breath sounds.  Abdominal:     General: Bowel sounds are normal.     Palpations: Abdomen is soft.     Tenderness: There is no abdominal tenderness. There is no right CVA tenderness, left CVA tenderness or guarding.  Musculoskeletal:     Cervical back: Normal range of motion.     Lumbar back: Normal range of motion. Positive right straight leg raise test. Negative left straight leg raise test.  Skin:    General: Skin is warm and dry.     Capillary Refill: Capillary refill takes less than 2 seconds.  Neurological:     General: No focal deficit present.     Mental Status: He is alert and oriented to person, place, and time.     Coordination: Coordination normal.     Gait: Gait normal.  Psychiatric:        Mood and Affect: Mood normal.        Behavior: Behavior normal.       Assessment & Plan:  Lumbar pain -     DG Lumbar Spine 2-3 Views; Future  Type 2 diabetes mellitus without complication, without long-term current use of insulin (HCC) -     Hemoglobin A1c  Mixed hyperlipidemia -     Lipid panel  Screening for prostate cancer -     PSA  Acute bilateral low back pain, unspecified whether sciatica present Assessment & Plan: Xray ordered awaiting results will follow up. Zanaflex 4 mg PRN Denies any recent back injury. Discussed medication desired effects, potential side effects. Non pharmacological interventions include rest, avoid twisting,improper bending, straining lower back. Demonstration of proper body mechanics. Alternate ice and heat. Recommend stretching back and legs.  Follow up for worsening or persistent symptoms. Patient verbalizes understanding regarding plan of care and all questions answered.    Primary hypertension Assessment & Plan: Vitals:   04/25/23 1122 04/25/23 1124  BP: (!) 168/100 (!) 146/98  Blood pressure not controlled in today visit Continue amlodipine 10 mg once daily Follow up in 1 week with at home blood pressure reading Continued discussion on DASH diet, low sodium diet and maintain a exercise routine for 150 minutes per week.    Other orders -     tiZANidine HCl; Take 1 capsule (4 mg total) by mouth every 12 (twelve) hours as needed for muscle spasms.  Dispense: 30 capsule; Refill: 3 -     Gabapentin; Take  1 capsule (300 mg total) by mouth every 8 (eight) hours as needed.  Dispense: 90 capsule; Refill: 3    Return in about 4 months (around 08/26/2023), or if symptoms worsen or fail to improve, for chronic follow-up, hypertension, type 2 diabetes.   Cruzita Lederer Newman Nip, FNP

## 2023-04-25 NOTE — Assessment & Plan Note (Addendum)
Xray ordered awaiting results will follow up. Zanaflex 4 mg PRN Denies any recent back injury. Discussed medication desired effects, potential side effects. Non pharmacological interventions include rest, avoid twisting,improper bending, straining lower back. Demonstration of proper body mechanics. Alternate ice and heat. Recommend stretching back and legs. Follow up for worsening or persistent symptoms. Patient verbalizes understanding regarding plan of care and all questions answered.

## 2023-04-26 ENCOUNTER — Ambulatory Visit (HOSPITAL_COMMUNITY)
Admission: RE | Admit: 2023-04-26 | Discharge: 2023-04-26 | Disposition: A | Discharge status: Home / Self Care | Payer: Medicaid Other | Source: Ambulatory Visit | Attending: Family Medicine | Admitting: Family Medicine

## 2023-04-26 DIAGNOSIS — M545 Low back pain, unspecified: Secondary | ICD-10-CM | POA: Insufficient documentation

## 2023-04-27 ENCOUNTER — Telehealth: Payer: Self-pay | Admitting: Family Medicine

## 2023-04-27 NOTE — Telephone Encounter (Signed)
[facility] pharmacy in [place] calling states patient's insurance does not cover Tizanidine capsules, it will need to be switched to the tablets. Please advise Thank You

## 2023-04-28 ENCOUNTER — Telehealth: Payer: Self-pay | Admitting: Family Medicine

## 2023-04-28 ENCOUNTER — Other Ambulatory Visit: Payer: Self-pay | Admitting: Family Medicine

## 2023-04-28 MED ORDER — ROSUVASTATIN CALCIUM 5 MG PO TABS: 5.0000 mg | ORAL_TABLET | Freq: Every day | ORAL | 3 refills | Status: DC

## 2023-04-28 MED ORDER — TIZANIDINE HCL 4 MG PO TABS: 4.0000 mg | ORAL_TABLET | Freq: Three times a day (TID) | ORAL | 1 refills | Status: DC | PRN

## 2023-04-28 MED ORDER — METFORMIN HCL 500 MG PO TABS
500.0000 mg | ORAL_TABLET | Freq: Every day | ORAL | 1 refills | Status: DC
Start: 2023-04-28 — End: 2023-10-20

## 2023-04-28 NOTE — Progress Notes (Signed)
Please inform patient,   Hemoglobin A1C 6.5 levels improved from prior reading Plan of treatment will include Continue Metformin 500 mg once daily    Cholesterol levels slightly elevated, follow diet low in saturated fat, reduce dietary salt intake, avoid fatty foods.  Crestor 5 mg once daily- Medication sent to pharmacy  PSA negative screening for prostate cancer   Repeat labs in our next visit .

## 2023-04-28 NOTE — Telephone Encounter (Signed)
Patient called lab results call back # [phone]

## 2023-04-28 NOTE — Telephone Encounter (Signed)
Labs have not been reviewed by provider yet will call him as soon as they are reviewed by pcp.

## 2023-05-02 ENCOUNTER — Other Ambulatory Visit: Payer: Self-pay | Admitting: Family Medicine

## 2023-05-02 ENCOUNTER — Other Ambulatory Visit: Payer: Medicaid Other | Admitting: *Deleted

## 2023-05-02 ENCOUNTER — Encounter: Payer: Self-pay | Admitting: *Deleted

## 2023-05-02 DIAGNOSIS — M545 Low back pain, unspecified: Secondary | ICD-10-CM

## 2023-05-02 NOTE — Patient Outreach (Signed)
Medicaid Managed Care   Nurse Care Manager Note  05/02/2023 Name:  Kirk Oconnor MRN:  [id number] DOB:  11-02-1955  Kirk Oconnor is an 67 y.o. year old male who is a primary patient of Kirk Oconnor.  The [facility] Managed Care Coordination team was consulted for assistance with:    Pain  Kirk Oconnor was given information about Medicaid Managed Care Coordination team services today. Kirk Oconnor Patient and Administrator, Civil Service (DPR) agreed to services and verbal consent obtained.  Engaged with patient by telephone for follow up visit in response to provider referral for case management and/or care coordination services.   Assessments/Interventions:  Review of past medical history, allergies, medications, health status, including review of consultants reports, laboratory and other test data, was performed as part of comprehensive evaluation and provision of chronic care management services.  SDOH (Social Determinants of Health) assessments and interventions performed: SDOH Interventions    Flowsheet Row Patient Outreach Telephone from 03/21/2023 in [place] [facility] Office Visit from 03/01/2023 in [facility] [address] Office Visit from 01/20/2023 in [facility]  SDOH Interventions     Food Insecurity Interventions Intervention Not Indicated -- --  Housing Interventions Other (Comment)  [Patient is scheduled with BSW on 03/23/23] AMB Referral --  Transportation Interventions Intervention Not Indicated -- --  Utilities Interventions Intervention Not Indicated -- --  Depression Interventions/Treatment  -- -- Counseling       Care Plan  No Known Allergies  Medications Reviewed Today     Reviewed by Kirk Dach, RN (Registered Nurse) on 05/02/23 at 1421  Med List Status: <None>   Medication Order Taking? Sig Documenting Provider Last Dose Status Informant  acetaminophen (TYLENOL) 500 MG tablet [id number]  Yes Take 500 mg by mouth every 6 (six) hours as needed. [provider] Taking Active   amLODipine (NORVASC) 10 MG tablet [id number] Yes Take 1 tablet (10 mg total) by mouth daily. Kirk Oconnor Taking Active   aspirin EC 81 MG tablet [id number] Yes Take 81 mg by mouth daily. [provider] Taking Active Family Member  gabapentin (NEURONTIN) 300 MG capsule [id number] Yes Take 1 capsule (300 mg total) by mouth every 8 (eight) hours as needed. Kirk Oconnor Taking Active   metFORMIN (GLUCOPHAGE) 500 MG tablet [id number] Yes Take 1 tablet (500 mg total) by mouth daily with breakfast. Kirk Oconnor Taking Active   methimazole (TAPAZOLE) 10 MG tablet [id number] Yes Take 1 tablet (10 mg total) by mouth daily with breakfast. Kirk Kayser, MD Taking Active   omeprazole (PRILOSEC) 40 MG capsule [id number] No Take 1 capsule (40 mg total) by mouth 2 (two) times daily.  Patient not taking: Reported on 04/05/2023   Kirk Bal, DO Not Taking Active            Med Note Kirk Oconnor, [facility]   Mon Mar 27, 2023  3:32 PM)    rosuvastatin (CRESTOR) 5 MG tablet [id number] Yes Take 1 tablet (5 mg total) by mouth daily. Kirk Oconnor Taking Active   sildenafil (VIAGRA) 100 MG tablet [id number] Yes Take 1 tablet (100 mg total) by mouth daily as needed for erectile dysfunction. Kirk Oconnor Taking Active   tamsulosin [facility]) 0.4 MG CAPS capsule [id number] Yes Take 0.4 mg by mouth daily. [provider] Taking Active   tiZANidine (ZANAFLEX) 4 MG tablet [id number] Yes Take 1  tablet (4 mg total) by mouth every 8 (eight) hours as needed for muscle spasms. Kirk Oconnor Taking Active             Patient Active Problem List   Diagnosis Date Noted   Acute back pain 04/25/2023   Hypertension 04/25/2023   Current smoker 04/05/2023   Hyponatremia 03/27/2023   Hyperthyroidism 03/27/2023   Erectile dysfunction  02/28/2023   Type 2 diabetes mellitus (HCC) 01/20/2023   Nocturia 01/18/2023   Unexplained weight loss 01/18/2023   Hepatic cirrhosis (HCC) 07/20/2022   Hyperglycemia 07/11/2017   Thrombocytopenia (HCC) 07/11/2017   Chronic back pain 06/26/2017   COPD (chronic obstructive pulmonary disease) (HCC) 04/14/2017   CVA (cerebral vascular accident) (HCC) 04/14/2017   History of alcohol abuse 04/14/2017   History of cocaine abuse (HCC) 04/14/2017   OSA (obstructive sleep apnea) 04/14/2017   BPH (benign prostatic hyperplasia) 10/22/2016   GERD (gastroesophageal reflux disease) 10/22/2016   Aortic root dilation (HCC) 10/21/2016   Acute diastolic CHF (congestive heart failure) (HCC) 10/21/2016   Essential hypertension, benign 03/27/2013   Hepatitis C 03/27/2013   Snoring 03/27/2013    Conditions to be addressed/monitored per PCP order:   Pain  Care Plan : RN Care Manager Plan of Care  Updates made by Kirk Dach, RN since 05/02/2023 12:00 AM     Problem: Health Management needs related to Chronic Pain      Long-Range Goal: Development of Plan of Care to address Health Management needs related to Chronic Pain   Start Date: 03/21/2023  Expected End Date: 06/19/2023  Note:   Current Barriers:  Chronic Disease Management support and education needs related to Chronic Pain  RNCM Clinical Goal(s):  Patient will verbalize understanding of plan for management of Chronic Pain as evidenced by patient reports take all medications exactly as prescribed and will call provider for medication related questions as evidenced by patient reports    attend all scheduled medical appointments:  05/09/23 with Kirk Oconnor as evidenced by provider documentation in EMR        continue to work with RN Care Manager and/or Social Worker to address care management and care coordination needs related to chronic pain as evidenced by adherence to CM Team Scheduled appointments        Interventions: Inter-disciplinary  care team collaboration (see longitudinal plan of care) Evaluation of current treatment plan related to  self management and patient's adherence to plan as established by provider   Pain:  (Status: Goal on Track (progressing): YES.) Long Term Goal  Pain assessment performed Medications reviewed Reviewed provider established plan for pain management; Discussed importance of adherence to all scheduled medical appointments; Counseled on the importance of reporting any/all new or changed pain symptoms or management strategies to pain management provider; Advised patient to report to care team affect of pain on daily activities; Discussed use of relaxation techniques and/or diversional activities to assist with pain reduction (distraction, imagery, relaxation, massage, acupressure, TENS, heat, and cold application; Reviewed with patient prescribed pharmacological and nonpharmacological pain relief strategies; Assessed social determinant of health barriers;  Collaborated with Dr.  Marjory Lies regarding update on Neurosurgery referral and needing CPAP replacements Advised patient to follow up with PCP regarding pain management Advised patient to request 90 day refills on appropriate medications  Patient Goals/Self-Care Activities: Take medications as prescribed   Attend all scheduled provider appointments Call provider office for new concerns or questions  Work with the social worker to address care  coordination needs and will continue to work with the clinical team to address health care and disease management related needs       Follow Up:  Patient agrees to Care Plan and Follow-up.  Plan: The Managed Medicaid care management team will reach out to the patient again over the next 30 days.  Date/time of next scheduled RN care management/care coordination outreach:  06/01/23 at 2:30pm  [name] RN, BSN [place]  Managed [facility] RN Care Coordinator [phone]

## 2023-05-02 NOTE — Patient Instructions (Signed)
Visit Information  Kirk Oconnor was given information about Medicaid Managed Care team care coordination services as a part of their [facility] Community Plan Medicaid benefit. Kirk Oconnor verbally consented to engagement with the [facility] Managed Care team.   If you are experiencing a medical emergency, please call 911 or report to your local emergency department or urgent care.   If you have a non-emergency medical problem during routine business hours, please contact your provider's office and ask to speak with a nurse.   For questions related to your [facility], please call: [phone] or visit the homepage here: [email or web address]  If you would like to schedule transportation through your [facility], please call the following number at least 2 days in advance of your appointment: [phone]   Rides for urgent appointments can also be made after hours by calling Member Services.  Call the Behavioral Health Crisis Line at [phone], at any time, 24 hours a day, 7 days a week. If you are in danger or need immediate medical attention call 911.  If you would like help to quit smoking, call 1-800-QUIT-NOW (609-651-1635) OR Espaol: 1-855-Djelo-Ya (0-272-536-6440) o para ms informacin haga clic aqu or Text READY to 347-425 to register via text  Kirk Oconnor,   Please see education materials related to pain provided by MyChart link.  Patient verbalizes understanding of instructions and care plan provided today and agrees to view in MyChart. Active MyChart status and patient understanding of how to access instructions and care plan via MyChart confirmed with patient.     Telephone follow up appointment with Managed Medicaid care management team member scheduled for:06/01/23 at 2:30pm  Kirk Emms RN, BSN [place]  Managed [facility] RN Care  Coordinator [phone]   Following is a copy of your plan of care:  Care Plan : RN Care Manager Plan of Care  Updates made by Kirk Dach, RN since 05/02/2023 12:00 AM     Problem: Health Management needs related to Chronic Pain      Long-Range Goal: Development of Plan of Care to address Health Management needs related to Chronic Pain   Start Date: 03/21/2023  Expected End Date: 06/19/2023  Note:   Current Barriers:  Chronic Disease Management support and education needs related to Chronic Pain  RNCM Clinical Goal(s):  Patient will verbalize understanding of plan for management of Chronic Pain as evidenced by patient reports take all medications exactly as prescribed and will call provider for medication related questions as evidenced by patient reports    attend all scheduled medical appointments:  05/09/23 with Dr. Fransico Him as evidenced by provider documentation in EMR        continue to work with RN Care Manager and/or Social Worker to address care management and care coordination needs related to chronic pain as evidenced by adherence to CM Team Scheduled appointments        Interventions: Inter-disciplinary care team collaboration (see longitudinal plan of care) Evaluation of current treatment plan related to  self management and patient's adherence to plan as established by provider   Pain:  (Status: Goal on Track (progressing): YES.) Long Term Goal  Pain assessment performed Medications reviewed Reviewed provider established plan for pain management; Discussed importance of adherence to all scheduled medical appointments; Counseled on the importance of reporting any/all new or changed pain symptoms or management strategies to pain management provider; Advised patient to report to care team affect of pain on daily activities;  Discussed use of relaxation techniques and/or diversional activities to assist with pain reduction (distraction, imagery, relaxation, massage,  acupressure, TENS, heat, and cold application; Reviewed with patient prescribed pharmacological and nonpharmacological pain relief strategies; Assessed social determinant of health barriers;  Collaborated with Dr.  Marjory Lies regarding update on Neurosurgery referral and needing CPAP replacements Advised patient to follow up with PCP regarding pain management Advised patient to request 90 day refills on appropriate medications  Patient Goals/Self-Care Activities: Take medications as prescribed   Attend all scheduled provider appointments Call provider office for new concerns or questions  Work with the social worker to address care coordination needs and will continue to work with the clinical team to address health care and disease management related needs

## 2023-05-04 NOTE — Telephone Encounter (Signed)
Patient calling states he is having questions regarding is overacting thyroid. Please advise Thank you

## 2023-05-05 NOTE — Telephone Encounter (Signed)
Spoke with patient. Dr. Fransico Him ordered the labs and he has an appt with him on 8/13. Advised to f/u with Dr. Fransico Him.

## 2023-05-09 ENCOUNTER — Encounter: Payer: Self-pay | Admitting: "Endocrinology

## 2023-05-09 ENCOUNTER — Ambulatory Visit (INDEPENDENT_AMBULATORY_CARE_PROVIDER_SITE_OTHER): Payer: Medicaid Other | Admitting: "Endocrinology

## 2023-05-09 VITALS — BP 128/86 | HR 64 | Ht 66.0 in | Wt 159.2 lb

## 2023-05-09 DIAGNOSIS — I10 Essential (primary) hypertension: Secondary | ICD-10-CM

## 2023-05-09 DIAGNOSIS — Z7984 Long term (current) use of oral hypoglycemic drugs: Secondary | ICD-10-CM

## 2023-05-09 DIAGNOSIS — E0590 Thyrotoxicosis, unspecified without thyrotoxic crisis or storm: Secondary | ICD-10-CM | POA: Diagnosis not present

## 2023-05-09 DIAGNOSIS — E119 Type 2 diabetes mellitus without complications: Secondary | ICD-10-CM

## 2023-05-09 DIAGNOSIS — E785 Hyperlipidemia, unspecified: Secondary | ICD-10-CM

## 2023-05-09 NOTE — Progress Notes (Signed)
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   Arthritis    CAD (coronary artery disease)    Nonobstructive at cardiac catheterization July 2018 - [facility]   Carpal tunnel syndrome    Depression    Diabetes mellitus without complication (HCC)    Essential hypertension    GERD (gastroesophageal reflux disease)    Hepatitis C    Sleep apnea    Past Surgical History:  Procedure Laterality Date   AORTIC VALVE REPLACEMENT  03/2017   [facility]   BIOPSY  12/15/2022   Procedure: BIOPSY;  Surgeon: Lanelle Bal, DO;  [facility];  Service: Endoscopy;;   COLONOSCOPY WITH PROPOFOL N/A 12/15/2022   Procedure: COLONOSCOPY WITH PROPOFOL;  Surgeon: Lanelle Bal, DO;  Location[facility];  Service: Endoscopy;  Laterality: N/A;  10:00 am, asa 3   ESOPHAGOGASTRODUODENOSCOPY (EGD) WITH PROPOFOL N/A 12/15/2022   Procedure: ESOPHAGOGASTRODUODENOSCOPY (EGD) WITH PROPOFOL;  Surgeon: Lanelle Bal, DO;  Location: [facility];  Service: Endoscopy;  Laterality: N/A;   POLYPECTOMY  12/15/2022   Procedure: POLYPECTOMY;  Surgeon: Lanelle Bal, DO;  Location: [facility];  Service: Endoscopy;;   Social History   Socioeconomic History   Marital status: Divorced    Spouse name: Not on file   Number of children: Not on file   Years of education: Not on file   Highest education level: Not on file  Occupational History   Not on file  Tobacco Use   Smoking status: Some Days    Types: Cigarettes   Smokeless  tobacco: Never  Vaping Use   Vaping status: Never Used  Substance and Sexual Activity   Alcohol use: Yes    Comment: socially   Drug use: No   Sexual activity: Yes  Other Topics Concern   Not on file  Social History Narrative   Not on file   Social Determinants of Health   Financial Resource Strain: Not on file  Food Insecurity: No Food Insecurity (03/21/2023)   Hunger Vital Sign    Worried About Running Out of Food in the Last Year: Never true    Ran Out of Food in the Last Year: Never true  Transportation Needs: No Transportation Needs (03/21/2023)   PRAPARE - Administrator, Civil Service (Medical): No    Lack of Transportation (Non-Medical): No  Physical Activity: Not on file  Stress: Not on file  Social Connections: Not on file   Family History  Problem Relation Age of Onset   Diabetes Mother    Heart attack Mother    Heart attack Father    Outpatient Encounter Medications as of 05/09/2023  Medication Sig   acetaminophen (TYLENOL) 500 MG tablet Take 500  mg by mouth every 6 (six) hours as needed.   amLODipine (NORVASC) 10 MG tablet Take 1 tablet (10 mg total) by mouth daily.   aspirin EC 81 MG tablet Take 81 mg by mouth daily.   gabapentin (NEURONTIN) 300 MG capsule Take 1 capsule (300 mg total) by mouth every 8 (eight) hours as needed.   metFORMIN (GLUCOPHAGE) 500 MG tablet Take 1 tablet (500 mg total) by mouth daily with breakfast.   methimazole (TAPAZOLE) 10 MG tablet Take 1 tablet (10 mg total) by mouth daily with breakfast.   omeprazole (PRILOSEC) 40 MG capsule Take 1 capsule (40 mg total) by mouth 2 (two) times daily. (Patient not taking: Reported on 04/05/2023)   rosuvastatin (CRESTOR) 5 MG tablet Take 1 tablet (5 mg total) by mouth daily.   sildenafil (VIAGRA) 100 MG tablet Take 1 tablet (100 mg total) by mouth daily as needed for erectile dysfunction.   tamsulosin (FLOMAX) 0.4 MG CAPS capsule Take 0.4 mg by mouth daily.   tiZANidine (ZANAFLEX) 4 MG  tablet Take 1 tablet (4 mg total) by mouth every 8 (eight) hours as needed for muscle spasms.   No facility-administered encounter medications on file as of 05/09/2023.   ALLERGIES: No Known Allergies  VACCINATION STATUS: Immunization History  Administered Date(s) Administered   Td 03/04/2019    HPI Kirk Oconnor is 67 y.o. male who presents today with a medical history as above. he is being seen in follow-up after he was seen in consultation for hyperthyroidism requested by Del Nigel Berthold, FNP.   Patient is not an optimal historian, accompanied by his sister today.    He was diagnosed with hypothyroidism in April 2024.  During his last visit, when he presented with improved symptoms of hyperthyroidism after treatment with methimazole for several weeks, his methimazole was lowered to 10 mg p.o. daily at breakfast.  He continued to generate symptomatic improvement.  He continued to regain some of the weight he lost unintentionally. He denies family history of thyroid dysfunction.  His other medical problems include hypertension on treatment with amlodipine.  His records also show type 2 diabetes with A1c ranging between 6.6% and 7.1%.  He is currently on metformin.  He also has high blood pressure and hypercholesterolemia on treatment.    He denies dysphagia, shortness of breath, nor voice change.  He denies any personal history of goiter. Patient is chronic smoker.  Review of Systems  Constitutional: + Lost 40 pounds prior to initiation of treatment with methimazole, presents with more or less steady weight gain.  + fatigue, no subjective hyperthermia, no subjective hypothermia   Objective:       05/09/2023   10:16 AM 04/25/2023   11:24 AM 04/25/2023   11:22 AM  Vitals with BMI  Height 5\' 6"   5\' 6"   Weight 159 lbs 3 oz  157 lbs  BMI 25.71  25.35  Systolic 128 146 284  Diastolic 86 98 100  Pulse 64  69    BP 128/86   Pulse 64   Ht 5\' 6"  (1.676 m)   Wt 159 lb 3.2 oz  (72.2 kg)   BMI 25.70 kg/m   Wt Readings from Last 3 Encounters:  05/09/23 159 lb 3.2 oz (72.2 kg)  04/25/23 157 lb (71.2 kg)  04/05/23 155 lb 9.6 oz (70.6 kg)    Physical Exam  Constitutional:  Body mass index is 25.7 kg/m.,  not in acute distress, normal state of mind Eyes: PERRLA, EOMI,  no exophthalmos ENT: moist mucous membranes, no gross thyromegaly, no gross cervical lymphadenopathy Cardiovascular: normal precordial activity, Regular Rate and Rhythm, no Murmur/Rubs/Gallops Respiratory:  adequate breathing efforts, no gross chest deformity, Clear to auscultation bilaterally   CMP ( most recent) CMP     Component Value Date/Time   NA 128 (L) 03/27/2023 1247   NA 139 06/26/2017 0908   K 4.0 03/27/2023 1247   CL 101 03/27/2023 1247   CO2 22 03/27/2023 1247   GLUCOSE 104 (H) 03/27/2023 1247   BUN 15 03/27/2023 1247   BUN 11 06/26/2017 0908   CREATININE 0.91 03/27/2023 1247   CREATININE 0.98 03/11/2013 1036   CALCIUM 9.0 03/27/2023 1247   PROT 7.3 02/23/2023 1130   PROT 7.1 06/26/2017 0908   ALBUMIN 3.5 02/23/2023 1130   ALBUMIN 3.8 06/26/2017 0908   AST 19 02/23/2023 1130   ALT 22 02/23/2023 1130   ALKPHOS 73 02/23/2023 1130   BILITOT 0.9 02/23/2023 1130   BILITOT 0.3 06/26/2017 0908   GFRNONAA >60 03/27/2023 1247   GFRNONAA 87 03/11/2013 1036     Diabetic Labs (most recent): Lab Results  Component Value Date   HGBA1C 6.5 (H) 04/26/2023   HGBA1C 6.6 (H) 11/22/2022   HGBA1C 7.1 (H) 01/27/2022   MICROALBUR 6.0 (H) 01/27/2022     Lipid Panel ( most recent) Lipid Panel     Component Value Date/Time   CHOL 167 04/26/2023 1031   TRIG 61 04/26/2023 1031   HDL 47 04/26/2023 1031   CHOLHDL 3.6 04/26/2023 1031   CHOLHDL 4.8 01/05/2023 1146   VLDL 18 01/05/2023 1146   LDLCALC 108 (H) 04/26/2023 1031   LABVLDL 12 04/26/2023 1031      Lab Results  Component Value Date   TSH 0.075 (L) 04/05/2023   TSH 0.026 (L) 03/27/2023   TSH <0.005 (L) 01/30/2023    TSH <0.005 (L) 01/20/2023   TSH 1.653 10/21/2016   FREET4 0.86 04/05/2023   FREET4 1.07 03/27/2023   FREET4 1.76 01/30/2023   FREET4 2.14 (H) 01/20/2023       Assessment & Plan:   1. Hyperthyroidism  2.  Hypertension   3.  Hyperlipidemia  - I have reviewed his new and available records and clinically evaluated the patient. - Based on these reviews, he has responded to methimazole towards target.   At this time, he is advised to hold his methimazole to facilitate thyroid uptake and scan.  If his scan shows significant uptake, he will be considered for radioactive iodine thyroid ablation.  His recent weight loss is probably helping him control his diabetes, most recent A1c of 6.6%.  He is back on metformin 500 mg p.o. daily at breakfast, rosuvastatin 5 mg p.o. nightly, amlodipine 10 mg p.o. daily at breakfast.    The patient was counseled on the dangers of tobacco use, and was advised to quit.  Reviewed strategies to maximize success, including removing cigarettes and smoking materials from environment.   - he is advised to maintain close follow up with Del Nigel Berthold, FNP for primary care needs.   I spent  21  minutes in the care of the patient today including review of labs from Thyroid Function, CMP, and other relevant labs ; imaging/biopsy records (current and previous including abstractions from other facilities); face-to-face time discussing  his lab results and symptoms, medications doses, his options of short and long term treatment based on the latest standards of care / guidelines;   and documenting the encounter.  Kirk Oconnor  participated in the discussions, expressed understanding, and voiced agreement with the above plans.  All questions were answered to his satisfaction. he is encouraged to contact clinic should he have any questions or concerns prior to his return visit.   Follow up plan: Return in about 2 weeks (around 05/23/2023) for F/U with Thyroid  Uptake and Scan.   Marquis Lunch, MD [facility] [address] [place], [place] [address] Phone: [phone]  Fax: [phone]     05/09/2023, 1:42 PM  This note was partially dictated with voice recognition software. Similar sounding words can be transcribed inadequately or may not  be corrected upon review.

## 2023-05-10 ENCOUNTER — Ambulatory Visit: Payer: Medicaid Other | Admitting: "Endocrinology

## 2023-05-11 ENCOUNTER — Other Ambulatory Visit: Payer: Medicaid Other | Admitting: *Deleted

## 2023-05-11 ENCOUNTER — Telehealth: Payer: Self-pay | Admitting: Diagnostic Neuroimaging

## 2023-05-11 ENCOUNTER — Telehealth: Payer: Self-pay | Admitting: Neurology

## 2023-05-11 NOTE — Telephone Encounter (Signed)
Received a notification from Heidi Dach, RN  "I spoke with [facility], they have not received the referral. Can your office please resend? Patient is scheduled with PCP on 8/23, I have advised him/sister to discuss needing CPAP during this visit. Thank you"  Reached out to our referral team to have her check on referral status and see about resending. She states that she will resend for the patient.

## 2023-05-11 NOTE — Patient Outreach (Signed)
Medicaid Managed Care   Nurse Care Manager Note  05/11/2023 Name:  Kirk Oconnor MRN:  [id number] DOB:  Dec 10, 1955  Kirk Oconnor is an 67 y.o. year old male who is a primary patient of Kirk Nigel Berthold, FNP.  The [facility] Managed Care Coordination team was consulted for assistance with:    Pain  Kirk Oconnor was given information about Medicaid Managed Care Coordination team services today. Kirk Oconnor Patient and Administrator, Civil Service (DPR) agreed to services and verbal consent obtained.  Engaged with patient by telephone for follow up visit in response to provider referral for case management and/or care coordination services.   Assessments/Interventions:  Review of past medical history, allergies, medications, health status, including review of consultants reports, laboratory and other test data, was performed as part of comprehensive evaluation and provision of chronic care management services.  SDOH (Social Determinants of Health) assessments and interventions performed: SDOH Interventions    Flowsheet Row Patient Outreach Telephone from 03/21/2023 in [place] [facility] Office Visit from 03/01/2023 in [facility] [place] Office Visit from 01/20/2023 in [facility]  SDOH Interventions     Food Insecurity Interventions Intervention Not Indicated -- --  Housing Interventions Other (Comment)  [Patient is scheduled with BSW on 03/23/23] AMB Referral --  Transportation Interventions Intervention Not Indicated -- --  Utilities Interventions Intervention Not Indicated -- --  Depression Interventions/Treatment  -- -- Counseling       Care Plan  No Known Allergies  Medications Reviewed Today   Medications were not reviewed in this encounter     Patient Active Problem List   Diagnosis Date Noted   Acute back pain 04/25/2023   Hypertension 04/25/2023   Current smoker 04/05/2023   Hyponatremia 03/27/2023    Hyperthyroidism 03/27/2023   Erectile dysfunction 02/28/2023   Type 2 diabetes mellitus (HCC) 01/20/2023   Nocturia 01/18/2023   Unexplained weight loss 01/18/2023   Hepatic cirrhosis (HCC) 07/20/2022   Hyperglycemia 07/11/2017   Thrombocytopenia (HCC) 07/11/2017   Chronic back pain 06/26/2017   COPD (chronic obstructive pulmonary disease) (HCC) 04/14/2017   CVA (cerebral vascular accident) (HCC) 04/14/2017   History of alcohol abuse 04/14/2017   History of cocaine abuse (HCC) 04/14/2017   OSA (obstructive sleep apnea) 04/14/2017   BPH (benign prostatic hyperplasia) 10/22/2016   GERD (gastroesophageal reflux disease) 10/22/2016   Aortic root dilation (HCC) 10/21/2016   Acute diastolic CHF (congestive heart failure) (HCC) 10/21/2016   Essential hypertension, benign 03/27/2013   Hepatitis C 03/27/2013   Snoring 03/27/2013    Conditions to be addressed/monitored per PCP order:   Pain  Care Plan : RN Care Manager Plan of Care  Updates made by Heidi Dach, RN since 05/11/2023 12:00 AM     Problem: Health Management needs related to Chronic Pain      Long-Range Goal: Development of Plan of Care to address Health Management needs related to Chronic Pain   Start Date: 03/21/2023  Expected End Date: 06/19/2023  Note:   Current Barriers:  Chronic Disease Management support and education needs related to Chronic Pain  RNCM Clinical Goal(s):  Patient will verbalize understanding of plan for management of Chronic Pain as evidenced by patient reports take all medications exactly as prescribed and will call provider for medication related questions as evidenced by patient reports    attend all scheduled medical appointments:  05/19/23 with PCP as evidenced by provider documentation in EMR  continue to work with Medical illustrator and/or Social Worker to address care management and care coordination needs related to chronic pain as evidenced by adherence to CM Team Scheduled  appointments        Interventions: Inter-disciplinary care team collaboration (see longitudinal plan of care) Evaluation of current treatment plan related to  self management and patient's adherence to plan as established by provider   Pain:  (Status: Goal on Track (progressing): YES.) Long Term Goal  Pain assessment performed Medications reviewed Reviewed provider established plan for pain management; Discussed importance of adherence to all scheduled medical appointments; Counseled on the importance of reporting any/all new or changed pain symptoms or management strategies to pain management provider; Advised patient to report to care team affect of pain on daily activities; Discussed use of relaxation techniques and/or diversional activities to assist with pain reduction (distraction, imagery, relaxation, massage, acupressure, TENS, heat, and cold application; Reviewed with patient prescribed pharmacological and nonpharmacological pain relief strategies; Assessed social determinant of health barriers;  Advised patient to follow up with PCP regarding needing new CPAP Contacted [facility] to assist with scheduling consult-referral placed by Dr. Doylene [facility] had not received referral, RNCM left message with New Patient Coordinator Collaborated with Dr. Marjory Lies requesting referral to be resent to [facility] Collaborated with patient's DPR/Sister regarding updates and needing scheduled with [facility]  Patient Goals/Self-Care Activities: Take medications as prescribed   Attend all scheduled provider appointments Call provider office for new concerns or questions  Work with the social worker to address care coordination needs and will continue to work with the clinical team to address health care and disease management related needs       Follow Up:  Patient agrees to Care Plan and Follow-up.  Plan: The Managed Medicaid care  management team will reach out to the patient again over the next 15 days.  Date/time of next scheduled RN care management/care coordination outreach:  06/01/23 @ 2:30am  [name] RN, BSN [place]  Managed [facility] RN Care Coordinator [phone]

## 2023-05-11 NOTE — Patient Instructions (Signed)
Visit Information  Mr. Affeldt was given information about Medicaid Managed Care team care coordination services as a part of their [facility] Community Plan Medicaid benefit. Kirk Oconnor verbally consented to engagement with the [facility] Managed Care team.   If you are experiencing a medical emergency, please call 911 or report to your local emergency department or urgent care.   If you have a non-emergency medical problem during routine business hours, please contact your provider's office and ask to speak with a nurse.   For questions related to your [facility], please call: [phone] or visit the homepage here: [email or web address]  If you would like to schedule transportation through your [facility], please call the following number at least 2 days in advance of your appointment: [phone]   Rides for urgent appointments can also be made after hours by calling Member Services.  Call the Behavioral Health Crisis Line at [phone], at any time, 24 hours a day, 7 days a week. If you are in danger or need immediate medical attention call 911.  If you would like help to quit smoking, call 1-800-QUIT-NOW (734-524-3244) OR Espaol: 1-855-Djelo-Ya (4-010-272-5366) o para ms informacin haga clic aqu or Text READY to 440-347 to register via text  Mr. Kirk Oconnor,   Please see education materials related to back pain provided by MyChart link.  Patient verbalizes understanding of instructions and care plan provided today and agrees to view in MyChart. Active MyChart status and patient understanding of how to access instructions and care plan via MyChart confirmed with patient.     Telephone follow up appointment with Managed Medicaid care management team member scheduled for:06/01/23 @ 2:30pm  Kirk Emms RN, BSN [place]  Managed [facility] RN Care  Coordinator [phone]   Following is a copy of your plan of care:  Care Plan : RN Care Manager Plan of Care  Updates made by Kirk Dach, RN since 05/11/2023 12:00 AM     Problem: Health Management needs related to Chronic Pain      Long-Range Goal: Development of Plan of Care to address Health Management needs related to Chronic Pain   Start Date: 03/21/2023  Expected End Date: 06/19/2023  Note:   Current Barriers:  Chronic Disease Management support and education needs related to Chronic Pain  RNCM Clinical Goal(s):  Patient will verbalize understanding of plan for management of Chronic Pain as evidenced by patient reports take all medications exactly as prescribed and will call provider for medication related questions as evidenced by patient reports    attend all scheduled medical appointments:  05/19/23 with PCP as evidenced by provider documentation in EMR        continue to work with RN Care Manager and/or Social Worker to address care management and care coordination needs related to chronic pain as evidenced by adherence to CM Team Scheduled appointments        Interventions: Inter-disciplinary care team collaboration (see longitudinal plan of care) Evaluation of current treatment plan related to  self management and patient's adherence to plan as established by provider   Pain:  (Status: Goal on Track (progressing): YES.) Long Term Goal  Pain assessment performed Medications reviewed Reviewed provider established plan for pain management; Discussed importance of adherence to all scheduled medical appointments; Counseled on the importance of reporting any/all new or changed pain symptoms or management strategies to pain management provider; Advised patient to report to care team affect of pain on daily activities;  Discussed use of relaxation techniques and/or diversional activities to assist with pain reduction (distraction, imagery, relaxation, massage, acupressure,  TENS, heat, and cold application; Reviewed with patient prescribed pharmacological and nonpharmacological pain relief strategies; Assessed social determinant of health barriers;  Advised patient to follow up with PCP regarding needing new CPAP Contacted [facility] to assist with scheduling consult-referral placed by Dr. Doylene Bode Neurosurgery had not received referral, RNCM left message with New Patient Coordinator Collaborated with Dr. Marjory Lies requesting referral to be resent to [facility] Collaborated with patient's DPR/Sister regarding updates and needing scheduled with [facility]  Patient Goals/Self-Care Activities: Take medications as prescribed   Attend all scheduled provider appointments Call provider office for new concerns or questions  Work with the social worker to address care coordination needs and will continue to work with the clinical team to address health care and disease management related needs

## 2023-05-11 NOTE — Telephone Encounter (Signed)
Referral for neurosurgery fax [facility]. Phone: [phone], Fax: [phone].

## 2023-05-15 ENCOUNTER — Encounter (HOSPITAL_COMMUNITY)
Admission: RE | Admit: 2023-05-15 | Discharge: 2023-05-15 | Disposition: A | Discharge status: Home / Self Care | Payer: Medicaid Other | Source: Ambulatory Visit | Attending: "Endocrinology | Admitting: "Endocrinology

## 2023-05-15 ENCOUNTER — Encounter (HOSPITAL_COMMUNITY)
Admission: RE | Admit: 2023-05-15 | Discharge: 2023-05-15 | Disposition: A | Discharge status: Home / Self Care | Payer: 59 | Source: Ambulatory Visit | Attending: "Endocrinology | Admitting: "Endocrinology

## 2023-05-15 DIAGNOSIS — R634 Abnormal weight loss: Secondary | ICD-10-CM | POA: Diagnosis not present

## 2023-05-15 DIAGNOSIS — E0590 Thyrotoxicosis, unspecified without thyrotoxic crisis or storm: Secondary | ICD-10-CM | POA: Diagnosis present

## 2023-05-15 DIAGNOSIS — Z79899 Other long term (current) drug therapy: Secondary | ICD-10-CM | POA: Insufficient documentation

## 2023-05-15 DIAGNOSIS — R61 Generalized hyperhidrosis: Secondary | ICD-10-CM | POA: Diagnosis not present

## 2023-05-15 MED ORDER — SODIUM IODIDE I-123 7.4 MBQ CAPS
432.0000 | ORAL_CAPSULE | Freq: Once | ORAL | Status: AC
  Administered 2023-05-15: 432 via ORAL

## 2023-05-16 ENCOUNTER — Ambulatory Visit: Payer: Medicaid Other | Admitting: "Endocrinology

## 2023-05-16 ENCOUNTER — Encounter (HOSPITAL_COMMUNITY)
Admission: RE | Admit: 2023-05-16 | Discharge: 2023-05-16 | Disposition: A | Discharge status: Home / Self Care | Payer: Medicaid Other | Source: Ambulatory Visit | Attending: "Endocrinology | Admitting: "Endocrinology

## 2023-05-17 ENCOUNTER — Telehealth: Payer: Self-pay | Admitting: "Endocrinology

## 2023-05-17 NOTE — Telephone Encounter (Signed)
Refax referral for neurosurgery to [facility]. Phone: [phone], Fax: [phone]

## 2023-05-17 NOTE — Telephone Encounter (Signed)
Contact to inform if [facility] had reached out to schedule appt. Pt stated, no one has called me. Refax referral to [facility]. Phone: [phone], Fax: [phone].

## 2023-05-17 NOTE — Telephone Encounter (Signed)
FYI Pt has an appt on 8/28. He called to see if we had his lab results. He states he had some done this week at "[facility]"

## 2023-05-19 ENCOUNTER — Ambulatory Visit (INDEPENDENT_AMBULATORY_CARE_PROVIDER_SITE_OTHER): Payer: Medicaid Other | Admitting: Family Medicine

## 2023-05-19 VITALS — BP 138/93 | HR 76 | Ht 66.0 in | Wt 158.0 lb

## 2023-05-19 DIAGNOSIS — M545 Low back pain, unspecified: Secondary | ICD-10-CM

## 2023-05-19 MED ORDER — METHYLPREDNISOLONE SODIUM SUCC 40 MG IJ SOLR
40.0000 mg | Freq: Once | INTRAMUSCULAR | Status: DC
Start: 2023-05-19 — End: 2023-05-19

## 2023-05-19 MED ORDER — LIDOCAINE 5 % EX PTCH
1.0000 | MEDICATED_PATCH | CUTANEOUS | 4 refills | Status: DC
Start: 2023-05-19 — End: 2023-07-27

## 2023-05-19 MED ORDER — METHYLPREDNISOLONE ACETATE 80 MG/ML IJ SUSP
40.0000 mg | Freq: Once | INTRAMUSCULAR | Status: AC
Start: 2023-05-19 — End: 2023-05-19
  Administered 2023-05-19: 40 mg via INTRAMUSCULAR

## 2023-05-19 NOTE — Addendum Note (Signed)
Addended by: Abner Greenspan on: 05/19/2023 04:26 PM   Modules accepted: Orders

## 2023-05-19 NOTE — Assessment & Plan Note (Addendum)
Solu-Medrol 40 mg IM injection given today Lidocaine 5 % patch Continue Zanaflex 4 mg Prn Patient is being followed by Neurology   Explained to patient Non pharmacological interventions include the use of ice or heat, rest, recommend range of motion exercises, gentle stretching. Patient verbalizes understanding regarding plan of care and all questions answered.

## 2023-05-19 NOTE — Progress Notes (Signed)
Patient Office Visit   Subjective   Patient ID: Kirk Oconnor, male    DOB: 17-Apr-1956  Age: 67 y.o. MRN: [id number]  CC:  Chief Complaint  Patient presents with   Injections    Requesting a Cortizone injection for back pain on spine.  Took gabapentin before today's visit for pain.     HPI Kirk Oconnor 67 year old male, presents to the clinic for Lumbar pain . 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   HPI    Outpatient Encounter Medications as of 05/19/2023  Medication Sig   lidocaine (LIDODERM) 5 % Place 1 patch onto the skin daily. Remove & Discard patch within 12 hours or as directed by MD   acetaminophen (TYLENOL) 500 MG tablet Take 500 mg by mouth every 6 (six) hours as needed.   amLODipine (NORVASC) 10 MG tablet Take 1 tablet (10 mg total) by mouth daily.   aspirin EC 81 MG tablet Take 81 mg by mouth daily.   gabapentin (NEURONTIN) 300 MG capsule Take 1 capsule (300 mg total) by mouth every 8 (eight) hours as needed.   metFORMIN (GLUCOPHAGE) 500 MG tablet Take 1 tablet (500 mg total) by mouth daily with breakfast.   methimazole (TAPAZOLE) 10 MG tablet Take 1 tablet (10 mg total) by mouth daily with breakfast.   omeprazole (PRILOSEC) 40 MG capsule Take 1 capsule (40 mg total) by mouth 2 (two) times daily. (Patient not taking: Reported on 04/05/2023)   rosuvastatin (CRESTOR) 5 MG tablet Take 1 tablet (5 mg total) by mouth daily.   sildenafil (VIAGRA) 100 MG tablet Take 1 tablet (100 mg total) by mouth daily as needed for erectile dysfunction.   tamsulosin (FLOMAX) 0.4 MG CAPS capsule Take 0.4 mg by mouth daily.    tiZANidine (ZANAFLEX) 4 MG tablet Take 1 tablet (4 mg total) by mouth every 8 (eight) hours as needed for muscle spasms.   Facility-Administered Encounter Medications as of 05/19/2023  Medication   methylPREDNISolone sodium succinate (SOLU-MEDROL) 40 mg/mL injection 40 mg    Past Surgical History:  Procedure Laterality Date   AORTIC VALVE REPLACEMENT  03/2017   [facility]   BIOPSY  12/15/2022   Procedure: BIOPSY;  Surgeon: Lanelle Bal, DO;  [facility];  Service: Endoscopy;;   COLONOSCOPY WITH PROPOFOL N/A 12/15/2022   Procedure: COLONOSCOPY WITH PROPOFOL;  Surgeon: Lanelle Bal, DO;  [facility];  Service: Endoscopy;  Laterality: N/A;  10:00 am, asa 3   ESOPHAGOGASTRODUODENOSCOPY (EGD) WITH PROPOFOL N/A 12/15/2022   Procedure: ESOPHAGOGASTRODUODENOSCOPY (EGD) WITH PROPOFOL;  Surgeon: Lanelle Bal, DO;  [facility]: [facility];  Service: Endoscopy;  Laterality: N/A;   POLYPECTOMY  12/15/2022   Procedure: POLYPECTOMY;  Surgeon: Lanelle Bal, DO;  Location: [facility];  Service: Endoscopy;;    ROS    Objective    BP (!) 138/93 (BP Location: Left Arm, Patient Position: Sitting, Cuff Size: Normal)   Pulse 76   Ht 5\' 6"  (1.676 m)   Wt 158 lb 0.6 oz (71.7 kg)   SpO2 95%   BMI 25.51 kg/m   Physical Exam Vitals reviewed.  Constitutional:      General: He is  not in acute distress.    Appearance: Normal appearance. He is not ill-appearing, toxic-appearing or diaphoretic.  HENT:     Head: Normocephalic.  Eyes:     General:        Right eye: No discharge.        Left eye: No discharge.     Conjunctiva/sclera: Conjunctivae normal.  Cardiovascular:     Rate and Rhythm: Normal rate.     Pulses: Normal pulses.     Heart sounds: Normal heart sounds.  Pulmonary:     Effort: Pulmonary effort is normal. No respiratory distress.     Breath sounds: Normal breath sounds.  Musculoskeletal:     Cervical back: Normal range of motion.     Lumbar  back: Tenderness present. Decreased range of motion. Positive right straight leg raise test and positive left straight leg raise test.  Skin:    General: Skin is warm and dry.     Capillary Refill: Capillary refill takes less than 2 seconds.  Neurological:     General: No focal deficit present.     Mental Status: He is alert and oriented to person, place, and time.     Coordination: Coordination normal.     Gait: Gait normal.  Psychiatric:        Mood and Affect: Mood normal.        Behavior: Behavior normal.       Assessment & Plan:  Lumbar pain Assessment & Plan: Solu-Medrol 40 mg IM injection given today Lidocaine 5 % patch Continue Zanaflex 4 mg Prn Patient is being followed by Neurology   Explained to patient Non pharmacological interventions include the use of ice or heat, rest, recommend range of motion exercises, gentle stretching. Patient verbalizes understanding regarding plan of care and all questions answered.   Orders: -     methylPREDNISolone Sodium Succ -     Lidocaine; Place 1 patch onto the skin daily. Remove & Discard patch within 12 hours or as directed by MD  Dispense: 30 patch; Refill: 4    Return if symptoms worsen or fail to improve.   Cruzita Lederer Newman Nip, FNP

## 2023-05-19 NOTE — Patient Instructions (Signed)
        Great to see you today.   - Please take medications as prescribed. - Follow up with your primary health provider if any health concerns arises. - If symptoms worsen please contact your primary care provider and/or visit the emergency department.  

## 2023-05-22 ENCOUNTER — Other Ambulatory Visit: Payer: Self-pay

## 2023-05-22 DIAGNOSIS — K7460 Unspecified cirrhosis of liver: Secondary | ICD-10-CM

## 2023-05-22 NOTE — Addendum Note (Signed)
Addended by: Zada Finders on: 05/22/2023 09:03 AM   Modules accepted: Orders

## 2023-05-24 ENCOUNTER — Telehealth: Payer: Self-pay | Admitting: Family Medicine

## 2023-05-24 ENCOUNTER — Encounter: Payer: Self-pay | Admitting: "Endocrinology

## 2023-05-24 ENCOUNTER — Ambulatory Visit: Payer: Medicaid Other | Admitting: "Endocrinology

## 2023-05-24 VITALS — BP 118/84 | HR 80 | Ht 66.0 in | Wt 155.8 lb

## 2023-05-24 DIAGNOSIS — E0590 Thyrotoxicosis, unspecified without thyrotoxic crisis or storm: Secondary | ICD-10-CM

## 2023-05-24 MED ORDER — METHIMAZOLE 5 MG PO TABS: 5.0000 mg | ORAL_TABLET | Freq: Every day | ORAL | 1 refills | Status: DC

## 2023-05-24 NOTE — Patient Instructions (Signed)

## 2023-05-24 NOTE — Telephone Encounter (Signed)
Patient returning lab results call 

## 2023-05-24 NOTE — Progress Notes (Signed)
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   Arthritis    CAD (coronary artery disease)    Nonobstructive at cardiac catheterization July 2018 - [facility]   Carpal tunnel syndrome    Depression    Diabetes mellitus without complication (HCC)    Essential hypertension    GERD (gastroesophageal reflux disease)    Hepatitis C    Sleep apnea    Past Surgical History:  Procedure Laterality Date   AORTIC VALVE REPLACEMENT  03/2017   [facility]   BIOPSY  12/15/2022   Procedure: BIOPSY;  Surgeon: Lanelle Bal, DO;  [facility];  Service: Endoscopy;;   COLONOSCOPY WITH PROPOFOL N/A 12/15/2022   Procedure: COLONOSCOPY WITH PROPOFOL;  Surgeon: Lanelle Bal, DO;  [facility];  Service: Endoscopy;  Laterality: N/A;  10:00 am, asa 3   ESOPHAGOGASTRODUODENOSCOPY (EGD) WITH PROPOFOL N/A 12/15/2022   Procedure: ESOPHAGOGASTRODUODENOSCOPY (EGD) WITH PROPOFOL;  Surgeon: Lanelle Bal, DO;  [facility];  Service: Endoscopy;  Laterality: N/A;   POLYPECTOMY  12/15/2022   Procedure: POLYPECTOMY;  Surgeon: Lanelle Bal, DO;  [facility]: [facility];  Service: Endoscopy;;   Social History   Socioeconomic History   Marital status: Divorced    Spouse name: Not on file   Number of children: Not on file   Years of education: Not on file   Highest education level: Not on file  Occupational History   Not on file  Tobacco Use   Smoking status: Some Days    Types: Cigarettes   Smokeless  tobacco: Never  Vaping Use   Vaping status: Never Used  Substance and Sexual Activity   Alcohol use: Yes    Comment: socially   Drug use: No   Sexual activity: Yes  Other Topics Concern   Not on file  Social History Narrative   Not on file   Social Determinants of Health   Financial Resource Strain: Not on file  Food Insecurity: No Food Insecurity (03/21/2023)   Hunger Vital Sign    Worried About Running Out of Food in the Last Year: Never true    Ran Out of Food in the Last Year: Never true  Transportation Needs: No Transportation Needs (03/21/2023)   PRAPARE - Administrator, Civil Service (Medical): No    Lack of Transportation (Non-Medical): No  Physical Activity: Not on file  Stress: Not on file  Social Connections: Not on file   Family History  Problem Relation Age of Onset   Diabetes Mother    Heart attack Mother    Heart attack Father    Outpatient Encounter Medications as of 05/24/2023  Medication Sig   acetaminophen (TYLENOL) 500 MG tablet Take 500  mg by mouth every 6 (six) hours as needed.   amLODipine (NORVASC) 10 MG tablet Take 1 tablet (10 mg total) by mouth daily.   aspirin EC 81 MG tablet Take 81 mg by mouth daily.   gabapentin (NEURONTIN) 300 MG capsule Take 1 capsule (300 mg total) by mouth every 8 (eight) hours as needed.   lidocaine (LIDODERM) 5 % Place 1 patch onto the skin daily. Remove & Discard patch within 12 hours or as directed by MD   metFORMIN (GLUCOPHAGE) 500 MG tablet Take 1 tablet (500 mg total) by mouth daily with breakfast.   methimazole (TAPAZOLE) 5 MG tablet Take 1 tablet (5 mg total) by mouth daily with breakfast.   omeprazole (PRILOSEC) 40 MG capsule Take 1 capsule (40 mg total) by mouth 2 (two) times daily. (Patient not taking: Reported on 04/05/2023)   rosuvastatin (CRESTOR) 5 MG tablet Take 1 tablet (5 mg total) by mouth daily.   sildenafil (VIAGRA) 100 MG tablet Take 1 tablet (100 mg total) by mouth daily as needed for  erectile dysfunction.   tamsulosin (FLOMAX) 0.4 MG CAPS capsule Take 0.4 mg by mouth daily.   tiZANidine (ZANAFLEX) 4 MG tablet Take 1 tablet (4 mg total) by mouth every 8 (eight) hours as needed for muscle spasms.   [DISCONTINUED] methimazole (TAPAZOLE) 10 MG tablet Take 1 tablet (10 mg total) by mouth daily with breakfast. (Patient not taking: Reported on 05/24/2023)   No facility-administered encounter medications on file as of 05/24/2023.   ALLERGIES: No Known Allergies  VACCINATION STATUS: Immunization History  Administered Date(s) Administered   Td 03/04/2019    HPI Kirk Oconnor is 67 y.o. male who presents today with a medical history as above. he is being seen in follow-up after he was seen in consultation for hyperthyroidism requested by Del Nigel Berthold, FNP.   Patient is not an optimal historian, accompanied by his sister today.    He was diagnosed with hyperthyroidism in April 2024.  During his last visit, when he presented with improved symptoms of hyperthyroidism after treatment with methimazole for several weeks, his methimazole was lowered to 10 mg p.o. daily at breakfast.  He continued to generate symptomatic improvement.  After his last visit, he was sent for thyroid uptake and scan which showed 35% uptake at 24 hours.  He denies family history of thyroid dysfunction.  His other medical problems include hypertension on treatment with amlodipine.  His records also show type 2 diabetes with A1c ranging between 6.6% and 7.1%.  He is currently on metformin.  He also has high blood pressure and hypercholesterolemia on treatment.    He denies dysphagia, shortness of breath, nor voice change.  He denies any personal history of goiter. Patient is chronic smoker.  Review of Systems  Constitutional: + Lost 40 pounds prior to initiation of treatment with methimazole, presents with more or less steady weight gain.  + fatigue, no subjective hyperthermia, no subjective  hypothermia   Objective:       05/24/2023    8:24 AM 05/19/2023    3:28 PM 05/09/2023   10:16 AM  Vitals with BMI  Height 5\' 6"  5\' 6"  5\' 6"   Weight 155 lbs 13 oz 158 lbs 1 oz 159 lbs 3 oz  BMI 25.16 25.52 25.71  Systolic 118 138 244  Diastolic 84 93 86  Pulse 80 76 64    BP 118/84   Pulse 80   Ht 5\' 6"  (1.676 m)   Wt  155 lb 12.8 oz (70.7 kg)   BMI 25.15 kg/m   Wt Readings from Last 3 Encounters:  05/24/23 155 lb 12.8 oz (70.7 kg)  05/19/23 158 lb 0.6 oz (71.7 kg)  05/09/23 159 lb 3.2 oz (72.2 kg)    Physical Exam  Constitutional:  Body mass index is 25.15 kg/m.,  not in acute distress, normal state of mind Eyes: PERRLA, EOMI, no exophthalmos ENT: moist mucous membranes, no gross thyromegaly, no gross cervical lymphadenopathy Cardiovascular: normal precordial activity, Regular Rate and Rhythm, no Murmur/Rubs/Gallops Respiratory:  adequate breathing efforts, no gross chest deformity, Clear to auscultation bilaterally   CMP ( most recent) CMP     Component Value Date/Time   NA 128 (L) 03/27/2023 1247   NA 139 06/26/2017 0908   K 4.0 03/27/2023 1247   CL 101 03/27/2023 1247   CO2 22 03/27/2023 1247   GLUCOSE 104 (H) 03/27/2023 1247   BUN 15 03/27/2023 1247   BUN 11 06/26/2017 0908   CREATININE 0.91 03/27/2023 1247   CREATININE 0.98 03/11/2013 1036   CALCIUM 9.0 03/27/2023 1247   PROT 7.3 02/23/2023 1130   PROT 7.1 06/26/2017 0908   ALBUMIN 3.5 02/23/2023 1130   ALBUMIN 3.8 06/26/2017 0908   AST 19 02/23/2023 1130   ALT 22 02/23/2023 1130   ALKPHOS 73 02/23/2023 1130   BILITOT 0.9 02/23/2023 1130   BILITOT 0.3 06/26/2017 0908   GFRNONAA >60 03/27/2023 1247   GFRNONAA 87 03/11/2013 1036     Diabetic Labs (most recent): Lab Results  Component Value Date   HGBA1C 6.5 (H) 04/26/2023   HGBA1C 6.6 (H) 11/22/2022   HGBA1C 7.1 (H) 01/27/2022   MICROALBUR 6.0 (H) 01/27/2022     Lipid Panel ( most recent) Lipid Panel     Component Value Date/Time   CHOL  167 04/26/2023 1031   TRIG 61 04/26/2023 1031   HDL 47 04/26/2023 1031   CHOLHDL 3.6 04/26/2023 1031   CHOLHDL 4.8 01/05/2023 1146   VLDL 18 01/05/2023 1146   LDLCALC 108 (H) 04/26/2023 1031   LABVLDL 12 04/26/2023 1031      Lab Results  Component Value Date   TSH 0.075 (L) 04/05/2023   TSH 0.026 (L) 03/27/2023   TSH <0.005 (L) 01/30/2023   TSH <0.005 (L) 01/20/2023   TSH 1.653 10/21/2016   FREET4 0.86 04/05/2023   FREET4 1.07 03/27/2023   FREET4 1.76 01/30/2023   FREET4 2.14 (H) 01/20/2023       Assessment & Plan:   1. Hyperthyroidism  2.  Hypertension   3.  Hyperlipidemia  - I have reviewed his new and available thyroid records and clinically evaluated the patient along with the presence of his sister in the room.   -In light of his questionable compliance ablating his thyroid will create a chronic condition which he may have difficulty following up with.  Given the low uptake of 35%, I offered him reinitiation of low-dose methimazole in lieu of RAI ablation.   I discussed and reinitiated methimazole 5 mg p.o. daily at breakfast with plan to repeat thyroid function test and office visit in 4 months.  Side effects and precautions with this medicine were discussed with the family. His recent weight loss is probably helping him control his diabetes, most recent A1c of 6.6%.  He is back on metformin 500 mg p.o. daily at breakfast, rosuvastatin 5 mg p.o. nightly, amlodipine 10 mg p.o. daily at breakfast.    The patient was counseled on the dangers  of tobacco use, and was advised to quit.  Reviewed strategies to maximize success, including removing cigarettes and smoking materials from environment.   - he is advised to maintain close follow up with Del Nigel Berthold, FNP for primary care needs.   I spent  21  minutes in the care of the patient today including review of labs from Thyroid Function, CMP, and other relevant labs ; imaging/biopsy records (current and previous  including abstractions from other facilities); face-to-face time discussing  his lab results and symptoms, medications doses, his options of short and long term treatment based on the latest standards of care / guidelines;   and documenting the encounter.  Kirk Oconnor  participated in the discussions, expressed understanding, and voiced agreement with the above plans.  All questions were answered to his satisfaction. he is encouraged to contact clinic should he have any questions or concerns prior to his return visit.   Follow up plan: Return in about 4 months (around 09/23/2023) for F/U with Pre-visit Labs.   Marquis Lunch, MD [facility] [address] [place], [place] [address] Phone: [phone]  Fax: [phone]     05/24/2023, 1:33 PM  This note was partially dictated with voice recognition software. Similar sounding words can be transcribed inadequately or may not  be corrected upon review.

## 2023-05-24 NOTE — Telephone Encounter (Signed)
Pt was confused orders were put in by Dr. Fransico Him states he will give them a call.

## 2023-06-01 ENCOUNTER — Ambulatory Visit: Payer: 59 | Admitting: *Deleted

## 2023-06-01 ENCOUNTER — Other Ambulatory Visit: Payer: Self-pay | Admitting: Family Medicine

## 2023-06-01 DIAGNOSIS — Z01818 Encounter for other preprocedural examination: Secondary | ICD-10-CM

## 2023-06-02 ENCOUNTER — Other Ambulatory Visit (HOSPITAL_COMMUNITY)
Admission: RE | Admit: 2023-06-02 | Discharge: 2023-06-02 | Disposition: A | Discharge status: Home / Self Care | Payer: 59 | Source: Ambulatory Visit | Attending: Gastroenterology | Admitting: Gastroenterology

## 2023-06-02 DIAGNOSIS — K7460 Unspecified cirrhosis of liver: Secondary | ICD-10-CM | POA: Insufficient documentation

## 2023-06-02 LAB — CBC WITH DIFFERENTIAL/PLATELET
Abs Immature Granulocytes: 0.01 10*3/uL (ref 0.00–0.07)
Basophils Absolute: 0 10*3/uL (ref 0.0–0.1)
Basophils Relative: 1 %
Eosinophils Absolute: 0.1 10*3/uL (ref 0.0–0.5)
Eosinophils Relative: 2 %
HCT: 43.9 % (ref 39.0–52.0)
Hemoglobin: 14.4 g/dL (ref 13.0–17.0)
Immature Granulocytes: 0 %
Lymphocytes Relative: 38 %
Lymphs Abs: 2.4 10*3/uL (ref 0.7–4.0)
MCH: 29 pg (ref 26.0–34.0)
MCHC: 32.8 g/dL (ref 30.0–36.0)
MCV: 88.5 fL (ref 80.0–100.0)
Monocytes Absolute: 0.5 10*3/uL (ref 0.1–1.0)
Monocytes Relative: 7 %
Neutro Abs: 3.2 10*3/uL (ref 1.7–7.7)
Neutrophils Relative %: 52 %
Platelets: 175 10*3/uL (ref 150–400)
RBC: 4.96 MIL/uL (ref 4.22–5.81)
RDW: 15.2 % (ref 11.5–15.5)
WBC: 6.3 10*3/uL (ref 4.0–10.5)
nRBC: 0 % (ref 0.0–0.2)

## 2023-06-02 LAB — COMPREHENSIVE METABOLIC PANEL
ALT: 30 U/L (ref 0–44)
AST: 22 U/L (ref 15–41)
Albumin: 3.9 g/dL (ref 3.5–5.0)
Alkaline Phosphatase: 80 U/L (ref 38–126)
Anion gap: 8 (ref 5–15)
BUN: 12 mg/dL (ref 8–23)
CO2: 23 mmol/L (ref 22–32)
Calcium: 9.2 mg/dL (ref 8.9–10.3)
Chloride: 100 mmol/L (ref 98–111)
Creatinine, Ser: 0.87 mg/dL (ref 0.61–1.24)
GFR, Estimated: >60 mL/min (ref >60)
Glucose, Bld: 152 mg/dL — ABNORMAL HIGH (ref 70–99)
Potassium: 3.9 mmol/L (ref 3.5–5.1)
Sodium: 131 mmol/L — ABNORMAL LOW (ref 135–145)
Total Bilirubin: 0.8 mg/dL (ref 0.3–1.2)
Total Protein: 7.6 g/dL (ref 6.5–8.1)

## 2023-06-02 LAB — PROTIME-INR
INR: 1.2 (ref 0.8–1.2)
Prothrombin Time: 15 s (ref 11.4–15.2)

## 2023-06-03 LAB — HCV RNA QUANT RFLX ULTRA OR GENOTYP
HCV RNA Qnt(log copy/mL): UNDETERMINED {Log_IU}/mL
HepC Qn: NOT DETECTED [IU]/mL

## 2023-06-05 ENCOUNTER — Ambulatory Visit (INDEPENDENT_AMBULATORY_CARE_PROVIDER_SITE_OTHER): Payer: 59

## 2023-06-05 DIAGNOSIS — Z01818 Encounter for other preprocedural examination: Secondary | ICD-10-CM | POA: Diagnosis not present

## 2023-06-06 ENCOUNTER — Encounter: Payer: Self-pay | Admitting: Internal Medicine

## 2023-06-06 ENCOUNTER — Ambulatory Visit (INDEPENDENT_AMBULATORY_CARE_PROVIDER_SITE_OTHER): Payer: 59 | Admitting: Internal Medicine

## 2023-06-06 ENCOUNTER — Encounter: Payer: Self-pay | Admitting: *Deleted

## 2023-06-06 ENCOUNTER — Other Ambulatory Visit: Payer: 59 | Admitting: *Deleted

## 2023-06-06 VITALS — BP 146/92 | HR 68 | Ht 66.0 in | Wt 158.6 lb

## 2023-06-06 DIAGNOSIS — E0590 Thyrotoxicosis, unspecified without thyrotoxic crisis or storm: Secondary | ICD-10-CM

## 2023-06-06 DIAGNOSIS — R42 Dizziness and giddiness: Secondary | ICD-10-CM

## 2023-06-06 DIAGNOSIS — I10 Essential (primary) hypertension: Secondary | ICD-10-CM

## 2023-06-06 DIAGNOSIS — R319 Hematuria, unspecified: Secondary | ICD-10-CM | POA: Diagnosis not present

## 2023-06-06 DIAGNOSIS — I444 Left anterior fascicular block: Secondary | ICD-10-CM

## 2023-06-06 DIAGNOSIS — I77810 Thoracic aortic ectasia: Secondary | ICD-10-CM

## 2023-06-06 MED ORDER — MECLIZINE HCL 25 MG PO TABS
25.0000 mg | ORAL_TABLET | Freq: Two times a day (BID) | ORAL | 0 refills | Status: DC | PRN
Start: 2023-06-06 — End: 2023-10-19

## 2023-06-06 NOTE — Assessment & Plan Note (Signed)
Recently started methimazole Likely contributing to elevated BP

## 2023-06-06 NOTE — Patient Instructions (Signed)
Visit Information  Kirk Oconnor was given information about Medicaid Managed Care team care coordination services as a part of their [facility] Medicaid benefit. Kirk Oconnor verbally consented to engagement with the [facility] Managed Care team.   If you are experiencing a medical emergency, please call 911 or report to your local emergency department or urgent care.   If you have a non-emergency medical problem during routine business hours, please contact your provider's office and ask to speak with a nurse.   For questions related to your [facility], please call: [phone] or visit the homepage here: [email or web address]  If you would like to schedule transportation through your [facility], please call the following number at least 2 days in advance of your appointment: [phone]   Rides for urgent appointments can also be made after hours by calling Member Services.  Call the Behavioral Health Crisis Line at [phone], at any time, 24 hours a day, 7 days a week. If you are in danger or need immediate medical attention call 911.  If you would like help to quit smoking, call 1-800-QUIT-NOW (240-531-4880) OR Espaol: 1-855-Djelo-Ya (3-664-403-4742) o para ms informacin haga clic aqu or Text READY to 595-638 to register via text  Kirk Oconnor,   Please see education materials related to managing pain provided by MyChart link.  Patient verbalizes understanding of instructions and care plan provided today and agrees to view in MyChart. Active MyChart status and patient understanding of how to access instructions and care plan via MyChart confirmed with patient.     Telephone follow up appointment with Managed Medicaid care management team member scheduled for:  Kirk Emms RN, BSN [place]  [facility] RN Care  Coordinator [phone]   Following is a copy of your plan of care:  Care Plan : RN Care Manager Plan of Care  Updates made by Heidi Dach, RN since 06/06/2023 12:00 AM     Problem: Health Management needs related to Chronic Pain      Long-Range Goal: Development of Plan of Care to address Health Management needs related to Chronic Pain   Start Date: 03/21/2023  Expected End Date: 06/19/2023  Note:   Current Barriers:  Chronic Disease Management support and education needs related to Chronic Pain  RNCM Clinical Goal(s):  Patient will verbalize understanding of plan for management of Chronic Pain as evidenced by patient reports take all medications exactly as prescribed and will call provider for medication related questions as evidenced by patient reports    attend all scheduled medical appointments:  Urology on 06/19/23 and 08/28/23 with PCP as evidenced by provider documentation in EMR        continue to work with RN Care Manager and/or Social Worker to address care management and care coordination needs related to chronic pain as evidenced by adherence to CM Team Scheduled appointments        Interventions: Inter-disciplinary care team collaboration (see longitudinal plan of care) Evaluation of current treatment plan related to  self management and patient's adherence to plan as established by provider   Pain:  (Status: Goal on Track (progressing): YES.) Long Term Goal  Pain assessment performed-5/10, which is improved Medications reviewed-discussed possible drowsiness with Meclizine and Tizanidine  Reviewed provider established plan for pain management; Discussed importance of adherence to all scheduled medical appointments; Counseled on the importance of reporting any/all new or changed pain symptoms or management strategies to pain  management provider; Advised patient to report to care team affect of pain on daily activities; Discussed use of relaxation techniques and/or  diversional activities to assist with pain reduction (distraction, imagery, relaxation, massage, acupressure, TENS, heat, and cold application; Reviewed with patient prescribed pharmacological and nonpharmacological pain relief strategies; Assessed social determinant of health barriers;  Reviewed notes from [facility], PCP and Dr. Fransico Him and discussed with patient Advised patient to contact pharmacy for rosuvastatin and lidocaine patches  Patient Goals/Self-Care Activities: Take medications as prescribed   Attend all scheduled provider appointments Call provider office for new concerns or questions  Work with the social worker to address care coordination needs and will continue to work with the clinical team to address health care and disease management related needs

## 2023-06-06 NOTE — Assessment & Plan Note (Signed)
Unclear if dark urine or actual hematuria Check UA

## 2023-06-06 NOTE — Assessment & Plan Note (Signed)
Unclear etiology, has history of vertigo -added meclizine as needed for dizziness Referred to cardiology as he has LAFB + incomplete RBBB and history of aortic valve replacement Maintain adequate hydration Avoid sudden positional changes

## 2023-06-06 NOTE — Progress Notes (Signed)
Acute Office Visit  Subjective:    Patient ID: Kirk Oconnor, male    DOB: [birth year], 67 y.o.   MRN: [id number]  Chief Complaint  Patient presents with   Weight Loss   Hematuria    HPI Patient is in today for an episode of dark urine this morning.  He thinks he saw blood in urine.  Denies any history of hematuria in the past.  He has a history of BPH and takes Flomax for it.  Denies any dysuria, urinary incontinence or flank pain currently.  He is also concerned about his weight loss.  Of note, he was recently diagnosed with hyperthyroidism and has been placed on methimazole by Dr. Fransico Him. He has not lost weight in the last 5 months, according to chart review.  But he prefers to weigh around 185 lbs, which used to be his usual weight.  He currently denies any diarrhea.  Denies any tremors or palpitations currently.  His BP was elevated today.  He has been taking amlodipine regularly.  He has history of aortic root dilation, status post aortic valve replacement.  He does not have any local cardiologist currently.  He reports episodes of dizziness, which are intermittent and he feels darkening of his surrounding at times.  Denies any chest pain or dyspnea currently.  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   Arthritis    CAD (coronary artery disease)    Nonobstructive at cardiac catheterization July 2018 - [facility]   Carpal tunnel syndrome    Depression    Diabetes mellitus without complication (HCC)    Essential hypertension    GERD (gastroesophageal reflux disease)    Hepatitis C    Sleep apnea     Past Surgical History:  Procedure Laterality Date   AORTIC VALVE REPLACEMENT  03/2017   [facility]   BIOPSY  12/15/2022    Procedure: BIOPSY;  Surgeon: Lanelle Bal, DO;  [facility]: [facility];  Service: Endoscopy;;   COLONOSCOPY WITH PROPOFOL N/A 12/15/2022   Procedure: COLONOSCOPY WITH PROPOFOL;  Surgeon: Lanelle Bal, DO;  [facility];  Service: Endoscopy;  Laterality: N/A;  10:00 am, asa 3   ESOPHAGOGASTRODUODENOSCOPY (EGD) WITH PROPOFOL N/A 12/15/2022   Procedure: ESOPHAGOGASTRODUODENOSCOPY (EGD) WITH PROPOFOL;  Surgeon: Lanelle Bal, DO;  [facility];  Service: Endoscopy;  Laterality: N/A;   POLYPECTOMY  12/15/2022   Procedure: POLYPECTOMY;  Surgeon: Lanelle Bal, DO;  [facility]: [facility];  Service: Endoscopy;;    Family History  Problem Relation Age of Onset   Diabetes Mother    Heart attack Mother    Heart attack Father     Social History   Socioeconomic History   Marital status: Divorced    Spouse name: Not on file   Number of children: Not on file   Years of education: Not on file   Highest education level: Not on file  Occupational History   Not on file  Tobacco Use   Smoking status: Some Days    Types: Cigarettes   Smokeless tobacco: Never  Vaping Use   Vaping status: Never Used  Substance and Sexual Activity   Alcohol use: Yes  Comment: socially   Drug use: No   Sexual activity: Yes  Other Topics Concern   Not on file  Social History Narrative   Not on file   Social Determinants of Health   Financial Resource Strain: Not on file  Food Insecurity: No Food Insecurity (03/21/2023)   Hunger Vital Sign    Worried About Running Out of Food in the Last Year: Never true    Ran Out of Food in the Last Year: Never true  Transportation Needs: No Transportation Needs (03/21/2023)   PRAPARE - Administrator, Civil Service (Medical): No    Lack of Transportation (Non-Medical): No  Physical Activity: Not on file  Stress: Not on file  Social Connections: Not on file  Intimate Partner Violence: Not At Risk (03/21/2023)    Humiliation, Afraid, Rape, and Kick questionnaire    Fear of Current or Ex-Partner: No    Emotionally Abused: No    Physically Abused: No    Sexually Abused: No    Outpatient Medications Prior to Visit  Medication Sig Dispense Refill   acetaminophen (TYLENOL) 500 MG tablet Take 500 mg by mouth every 6 (six) hours as needed.     amLODipine (NORVASC) 10 MG tablet Take 1 tablet (10 mg total) by mouth daily. 30 tablet 0   aspirin EC 81 MG tablet Take 81 mg by mouth daily.     gabapentin (NEURONTIN) 300 MG capsule Take 1 capsule (300 mg total) by mouth every 8 (eight) hours as needed. 90 capsule 3   lidocaine (LIDODERM) 5 % Place 1 patch onto the skin daily. Remove & Discard patch within 12 hours or as directed by MD 30 patch 4   metFORMIN (GLUCOPHAGE) 500 MG tablet Take 1 tablet (500 mg total) by mouth daily with breakfast. 90 tablet 1   methimazole (TAPAZOLE) 5 MG tablet Take 1 tablet (5 mg total) by mouth daily with breakfast. 90 tablet 1   omeprazole (PRILOSEC) 40 MG capsule Take 1 capsule (40 mg total) by mouth 2 (two) times daily. 60 capsule 11   rosuvastatin (CRESTOR) 5 MG tablet Take 1 tablet (5 mg total) by mouth daily. 90 tablet 3   sildenafil (VIAGRA) 100 MG tablet Take 1 tablet (100 mg total) by mouth daily as needed for erectile dysfunction. 30 tablet 1   tamsulosin (FLOMAX) 0.4 MG CAPS capsule Take 0.4 mg by mouth daily.     tiZANidine (ZANAFLEX) 4 MG tablet Take 1 tablet (4 mg total) by mouth every 8 (eight) hours as needed for muscle spasms. 30 tablet 1   No facility-administered medications prior to visit.    No Known Allergies  Review of Systems  Constitutional:  Negative for chills and fever.  HENT:  Negative for congestion and sore throat.   Eyes:  Negative for pain and discharge.  Respiratory:  Negative for cough and shortness of breath.   Cardiovascular:  Negative for chest pain and palpitations.  Gastrointestinal:  Negative for constipation, diarrhea, nausea and  vomiting.  Endocrine: Negative for polydipsia and polyuria.  Genitourinary:  Positive for hematuria. Negative for dysuria.  Musculoskeletal:  Negative for neck pain and neck stiffness.  Skin:  Negative for rash.  Neurological:  Positive for dizziness. Negative for weakness, numbness and headaches.  Psychiatric/Behavioral:  Negative for agitation and behavioral problems.        Objective:    Physical Exam Vitals reviewed.  Constitutional:      General: He is not in acute distress.  Appearance: He is not diaphoretic.  HENT:     Head: Normocephalic and atraumatic.  Eyes:     General: No scleral icterus.    Extraocular Movements: Extraocular movements intact.  Cardiovascular:     Rate and Rhythm: Normal rate and regular rhythm.     Heart sounds: Normal heart sounds. No murmur heard. Pulmonary:     Breath sounds: Normal breath sounds. No wheezing or rales.  Abdominal:     Palpations: Abdomen is soft.     Tenderness: There is no abdominal tenderness.  Musculoskeletal:     Cervical back: Neck supple. No tenderness.     Right lower leg: No edema.     Left lower leg: No edema.  Skin:    General: Skin is warm.     Findings: No rash.  Neurological:     General: No focal deficit present.     Mental Status: He is alert and oriented to person, place, and time.  Psychiatric:        Mood and Affect: Mood normal.        Behavior: Behavior normal.     BP (!) 146/92 (BP Location: Left Arm)   Pulse 68   Ht 5\' 6"  (1.676 m)   Wt 158 lb 9.6 oz (71.9 kg)   SpO2 96%   BMI 25.60 kg/m  Wt Readings from Last 3 Encounters:  06/06/23 158 lb 9.6 oz (71.9 kg)  05/24/23 155 lb 12.8 oz (70.7 kg)  05/19/23 158 lb 0.6 oz (71.7 kg)        Assessment & Plan:   Problem List Items Addressed This Visit       Cardiovascular and Mediastinum   Essential hypertension, benign    BP Readings from Last 1 Encounters:  06/06/23 (!) 146/92   Uncontrolled with amlodipine 10 mg ONCE  DAILY Considering his hyperthyroidism and recently started treatment, would avoid adding agent currently Would have preferred to add propranolol if heart rate was > 80 Counseled for compliance with the medications Advised DASH diet and moderate exercise/walking, at least 150 mins/week       Aortic root dilation (HCC)    S/p aortic valve replacement Referred to cardiology      Relevant Orders   Ambulatory referral to Cardiology   Left anterior fascicular block (LAFB)    EKG yesterday showed LAFB and incomplete RBBB He has had episodes of dizziness, unclear if related to bundle branch block Referred to cardiology        Endocrine   Hyperthyroidism    Recently started methimazole Likely contributing to elevated BP        Other   Dizziness    Unclear etiology, has history of vertigo -added meclizine as needed for dizziness Referred to cardiology as he has LAFB + incomplete RBBB and history of aortic valve replacement Maintain adequate hydration Avoid sudden positional changes      Relevant Medications   meclizine (ANTIVERT) 25 MG tablet   Hematuria - Primary    Unclear if dark urine or actual hematuria Check UA      Relevant Orders   Urinalysis   Urinalysis, Routine w reflex microscopic     Meds ordered this encounter  Medications   meclizine (ANTIVERT) 25 MG tablet    Sig: Take 1 tablet (25 mg total) by mouth 2 (two) times daily as needed for dizziness.    Dispense:  30 tablet    Refill:  0     Isabellah Sobocinski Concha Se, MD

## 2023-06-06 NOTE — Assessment & Plan Note (Signed)
BP Readings from Last 1 Encounters:  06/06/23 (!) 146/92   Uncontrolled with amlodipine 10 mg ONCE DAILY Considering his hyperthyroidism and recently started treatment, would avoid adding agent currently Would have preferred to add propranolol if heart rate was > 80 Counseled for compliance with the medications Advised DASH diet and moderate exercise/walking, at least 150 mins/week

## 2023-06-06 NOTE — Assessment & Plan Note (Signed)
S/p aortic valve replacement Referred to cardiology

## 2023-06-06 NOTE — Patient Outreach (Signed)
Medicaid Managed Care   Nurse Care Manager Note  06/06/2023 Name:  Kirk Oconnor MRN:  [id number] DOB:  1956/08/28  Kirk Oconnor is an 67 y.o. year old male who is a primary patient of Del Nigel Berthold, FNP.  The [facility] Managed Care Coordination team was consulted for assistance with:    Pain  Mr. Trevizo was given information about Medicaid Managed Care Coordination team services today. Kirk Oconnor Patient agreed to services and verbal consent obtained.  Engaged with patient by telephone for follow up visit in response to provider referral for case management and/or care coordination services.   Assessments/Interventions:  Review of past medical history, allergies, medications, health status, including review of consultants reports, laboratory and other test data, was performed as part of comprehensive evaluation and provision of chronic care management services.  SDOH (Social Determinants of Health) assessments and interventions performed: SDOH Interventions    Flowsheet Row Patient Outreach Telephone from 03/21/2023 in [place] [facility] DEPARTMENT Office Visit from 03/01/2023 in [facility] [place] Office Visit from 01/20/2023 in [facility]  SDOH Interventions     Food Insecurity Interventions Intervention Not Indicated -- --  Housing Interventions Other (Comment)  [Patient is scheduled with BSW on 03/23/23] AMB Referral --  Transportation Interventions Intervention Not Indicated -- --  Utilities Interventions Intervention Not Indicated -- --  Depression Interventions/Treatment  -- -- Counseling       Care Plan  No Known Allergies  Medications Reviewed Today     Reviewed by Heidi Dach, RN (Registered Nurse) on 06/06/23 at 1425  Med List Status: <None>   Medication Order Taking? Sig Documenting Provider Last Dose Status Informant  acetaminophen (TYLENOL) 500 MG tablet [id number] Yes Take 500 mg by mouth every 6  (six) hours as needed. [provider]  Active   amLODipine (NORVASC) 10 MG tablet [id number] Yes Take 1 tablet (10 mg total) by mouth daily. Vear Clock  Active   aspirin EC 81 MG tablet [id number] Yes Take 81 mg by mouth daily. [provider]  Active Family Member  gabapentin (NEURONTIN) 300 MG capsule [id number] Yes Take 1 capsule (300 mg total) by mouth every 8 (eight) hours as needed. Del Newman Nip, Tenna Child, FNP  Active   lidocaine (LIDODERM) 5 % [id number]  Place 1 patch onto the skin daily. Remove & Discard patch within 12 hours or as directed by MD Del Nigel Berthold, FNP  Active   meclizine (ANTIVERT) 25 MG tablet [id number] Yes Take 1 tablet (25 mg total) by mouth 2 (two) times daily as needed for dizziness. Anabel Halon, MD  Active   metFORMIN (GLUCOPHAGE) 500 MG tablet [id number] Yes Take 1 tablet (500 mg total) by mouth daily with breakfast. Del Newman Nip, Tenna Child, FNP  Active   methimazole (TAPAZOLE) 5 MG tablet [id number] Yes Take 1 tablet (5 mg total) by mouth daily with breakfast. Roma Kayser, MD  Active   omeprazole (PRILOSEC) 40 MG capsule [id number] Yes Take 1 capsule (40 mg total) by mouth 2 (two) times daily. Lanelle Bal, DO  Active            Med Note Ephriam Knuckles, [facility]   Mon Mar 27, 2023  3:32 PM)    rosuvastatin (CRESTOR) 5 MG tablet [id number]  Take 1 tablet (5 mg total) by mouth daily. Del Newman Nip, Tenna Child, FNP  Active   sildenafil (VIAGRA) 100 MG tablet [id number] Yes Take 1  tablet (100 mg total) by mouth daily as needed for erectile dysfunction. Donnita Falls, FNP  Active   tamsulosin (FLOMAX) 0.4 MG CAPS capsule [id number] Yes Take 0.4 mg by mouth daily. [provider]  Active   tiZANidine (ZANAFLEX) 4 MG tablet [id number] Yes Take 1 tablet (4 mg total) by mouth every 8 (eight) hours as needed for muscle spasms. Del Nigel Berthold, FNP  Active             Patient Active Problem List   Diagnosis Date  Noted   Dizziness 06/06/2023   Hematuria 06/06/2023   Left anterior fascicular block (LAFB) 06/06/2023   Lumbar pain 04/25/2023   Hypertension 04/25/2023   Current smoker 04/05/2023   Hyponatremia 03/27/2023   Hyperthyroidism 03/27/2023   Erectile dysfunction 02/28/2023   Type 2 diabetes mellitus (HCC) 01/20/2023   Nocturia 01/18/2023   Unexplained weight loss 01/18/2023   Hepatic cirrhosis (HCC) 07/20/2022   Hyperglycemia 07/11/2017   Thrombocytopenia (HCC) 07/11/2017   Chronic back pain 06/26/2017   COPD (chronic obstructive pulmonary disease) (HCC) 04/14/2017   CVA (cerebral vascular accident) (HCC) 04/14/2017   History of alcohol abuse 04/14/2017   History of cocaine abuse (HCC) 04/14/2017   OSA (obstructive sleep apnea) 04/14/2017   BPH (benign prostatic hyperplasia) 10/22/2016   GERD (gastroesophageal reflux disease) 10/22/2016   Aortic root dilation (HCC) 10/21/2016   Acute diastolic CHF (congestive heart failure) (HCC) 10/21/2016   Essential hypertension, benign 03/27/2013   Hepatitis C 03/27/2013   Snoring 03/27/2013    Conditions to be addressed/monitored per PCP order:   Pain  Care Plan : RN Care Manager Plan of Care  Updates made by Heidi Dach, RN since 06/06/2023 12:00 AM     Problem: Health Management needs related to Chronic Pain      Long-Range Goal: Development of Plan of Care to address Health Management needs related to Chronic Pain   Start Date: 03/21/2023  Expected End Date: 06/19/2023  Note:   Current Barriers:  Chronic Disease Management support and education needs related to Chronic Pain  RNCM Clinical Goal(s):  Patient will verbalize understanding of plan for management of Chronic Pain as evidenced by patient reports take all medications exactly as prescribed and will call provider for medication related questions as evidenced by patient reports    attend all scheduled medical appointments:  Urology on 06/19/23 and 08/28/23 with PCP as  evidenced by provider documentation in EMR        continue to work with RN Care Manager and/or Social Worker to address care management and care coordination needs related to chronic pain as evidenced by adherence to CM Team Scheduled appointments        Interventions: Inter-disciplinary care team collaboration (see longitudinal plan of care) Evaluation of current treatment plan related to  self management and patient's adherence to plan as established by provider   Pain:  (Status: Goal on Track (progressing): YES.) Long Term Goal  Pain assessment performed-5/10, which is improved Medications reviewed-discussed possible drowsiness with Meclizine and Tizanidine  Reviewed provider established plan for pain management; Discussed importance of adherence to all scheduled medical appointments; Counseled on the importance of reporting any/all new or changed pain symptoms or management strategies to pain management provider; Advised patient to report to care team affect of pain on daily activities; Discussed use of relaxation techniques and/or diversional activities to assist with pain reduction (distraction, imagery, relaxation, massage, acupressure, TENS, heat, and cold application; Reviewed with patient prescribed pharmacological  and nonpharmacological pain relief strategies; Assessed social determinant of health barriers;  Reviewed notes from [facility], PCP and Dr. Fransico Him and discussed with patient Advised patient to contact pharmacy for rosuvastatin and lidocaine patches  Patient Goals/Self-Care Activities: Take medications as prescribed   Attend all scheduled provider appointments Call provider office for new concerns or questions  Work with the social worker to address care coordination needs and will continue to work with the clinical team to address health care and disease management related needs       Follow Up:  Patient agrees to Care Plan and Follow-up.  Plan: The  Managed Medicaid care management team will reach out to the patient again over the next 30 days.  Date/time of next scheduled RN care management/care coordination outreach:  07/06/23 @ 2:30pm  [name] RN, BSN [place]  [facility] RN Care Coordinator [phone]

## 2023-06-06 NOTE — Assessment & Plan Note (Signed)
EKG yesterday showed LAFB and incomplete RBBB He has had episodes of dizziness, unclear if related to bundle branch block Referred to cardiology

## 2023-06-06 NOTE — Patient Instructions (Addendum)
Please start taking Meclizine as needed for dizziness. Avoid sudden positional changes.  You are being referred to Cardiology.  Please continue taking Methimazole regularly.

## 2023-06-07 LAB — URINALYSIS, ROUTINE W REFLEX MICROSCOPIC
Bilirubin, UA: NEGATIVE
Glucose, UA: NEGATIVE
Ketones, UA: NEGATIVE
Leukocytes,UA: NEGATIVE
Nitrite, UA: NEGATIVE
Protein,UA: NEGATIVE
RBC, UA: NEGATIVE
Specific Gravity, UA: 1.018 (ref 1.005–1.030)
Urobilinogen, Ur: 1 mg/dL (ref 0.2–1.0)
pH, UA: 6.5 (ref 5.0–7.5)

## 2023-06-15 NOTE — Progress Notes (Signed)
Name: Kirk Oconnor DOB: 24-Oct-1955 MRN: [id number]  History of Present Illness: Kirk Oconnor is a 67 y.o. male who presents today for return visit at [facility]. - GU history: 1. BPH with LUTS (nocturia). - Previously took Flomax. - PSA values have been normal (1.4 on 01/20/2023 and 1.0 on 04/26/2023).  2. Erectile dysfunction. - Had inadequate response to Sildenafil or Tadalafil. - Intracorporeal injections with Alprostadil were cost prohibitive.     At initial urology visit on 03/01/2023: - Denied wearing CPAP for OSA because when he was incarcerated it was taken from him. - Reported fluid intake in evening & overnight.  - Struggling with multiple challenges including mental health, limited social support, financial hardship, and housing instability. - The plan was:  1. Flomax discontinued (per patient). 2. Restart Viagra 100 mg PRN for erectile dysfunction. Can use with or without vacuum erectile device. May try Eroxon when that becomes commercially available (hopefully in 2025).  3. Obtain & use new CPAP machine for OSA.  4. Referral placed to Social Work / Chronic Care Management.  5. Minimize caffeine intake. 6. Minimize fluid intake within 3 hours before bedtime. 7. Minimize overnight fluid intake. 8. Return in about 6 months (around 08/31/2023) for UA, PVR, & f/u with Kirk Georges NP.  Since last visit: - 06/02/2023: Normal renal function (GFR >60; creatinine 0.87).  - 06/06/2023: Seen by PCP for "an episode of dark urine this morning. He thinks he saw blood in urine. Denies any history of hematuria in the past." UA was negative.  Of note, CT chest/abdomen/pelvis w/o contrast on 01/23/2023 showed no GU stones, masses, or hydronephrosis.  Today: He denies history of kidney stones.  He reports dark malorodorous urine. He denies urinary hesitancy, urgency, frequency, dysuria, gross hematuria, straining to void, or sensations of incomplete emptying.  He  reports that the Viagra 100 mg PRN continues to be ineffective for his erectile dysfunction. He gets a weak erection that doesn't last long and takes a very long time to occur. He denies trying a vacuum erection device.   Fall Screening: Do you usually have a device to assist in your mobility? No   Medications: Current Outpatient Medications  Medication Sig Dispense Refill   acetaminophen (TYLENOL) 500 MG tablet Take 500 mg by mouth every 6 (six) hours as needed.     AMBULATORY NON FORMULARY MEDICATION Vacuum erection device. Dispense 1. 1 each 0   amLODipine (NORVASC) 10 MG tablet Take 1 tablet (10 mg total) by mouth daily. 30 tablet 0   aspirin EC 81 MG tablet Take 81 mg by mouth daily.     gabapentin (NEURONTIN) 300 MG capsule Take 1 capsule (300 mg total) by mouth every 8 (eight) hours as needed. 90 capsule 3   lidocaine (LIDODERM) 5 % Place 1 patch onto the skin daily. Remove & Discard patch within 12 hours or as directed by MD 30 patch 4   meclizine (ANTIVERT) 25 MG tablet Take 1 tablet (25 mg total) by mouth 2 (two) times daily as needed for dizziness. 30 tablet 0   metFORMIN (GLUCOPHAGE) 500 MG tablet Take 1 tablet (500 mg total) by mouth daily with breakfast. 90 tablet 1   methimazole (TAPAZOLE) 5 MG tablet Take 1 tablet (5 mg total) by mouth daily with breakfast. 90 tablet 1   omeprazole (PRILOSEC) 40 MG capsule Take 1 capsule (40 mg total) by mouth 2 (two) times daily. 60 capsule 11   rosuvastatin (CRESTOR) 5  MG tablet Take 1 tablet (5 mg total) by mouth daily. 90 tablet 3   sildenafil (VIAGRA) 100 MG tablet Take 1 tablet (100 mg total) by mouth daily as needed for erectile dysfunction. 30 tablet 1   tiZANidine (ZANAFLEX) 4 MG tablet Take 1 tablet (4 mg total) by mouth every 8 (eight) hours as needed for muscle spasms. 30 tablet 1   No current facility-administered medications for this visit.    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   Arthritis    CAD (coronary artery disease)    Nonobstructive at cardiac catheterization July 2018 - [facility]   Carpal tunnel syndrome    Depression    Diabetes mellitus without complication (HCC)    Essential hypertension    GERD (gastroesophageal reflux disease)    Hepatitis C    Sleep apnea    Past Surgical History:  Procedure Laterality Date   AORTIC VALVE REPLACEMENT  03/2017   [facility]   BIOPSY  12/15/2022   Procedure: BIOPSY;  Surgeon: Lanelle Bal, DO;  [facility];  Service: Endoscopy;;   COLONOSCOPY WITH PROPOFOL N/A 12/15/2022   Procedure: COLONOSCOPY WITH PROPOFOL;  Surgeon: Lanelle Bal, DO;  [facility];  Service: Endoscopy;  Laterality: N/A;  10:00 am, asa 3   ESOPHAGOGASTRODUODENOSCOPY (EGD) WITH PROPOFOL N/A 12/15/2022   Procedure: ESOPHAGOGASTRODUODENOSCOPY (EGD) WITH PROPOFOL;  Surgeon: Lanelle Bal, DO;  [facility];  Service: Endoscopy;  Laterality: N/A;   POLYPECTOMY  12/15/2022   Procedure: POLYPECTOMY;  Surgeon: Lanelle Bal, DO;  [facility];  Service: Endoscopy;;   Family History  Problem Relation Age of Onset   Diabetes Mother    Heart attack Mother    Heart attack Father    Social History   Socioeconomic History   Marital status: Divorced    Spouse name: Not on file   Number of children: Not on file   Years of education: Not on file   Highest education level: Not on file  Occupational History   Not on file  Tobacco Use   Smoking status: Some Days    Types: Cigarettes   Smokeless tobacco: Never  Vaping Use   Vaping status: Never Used  Substance and Sexual Activity   Alcohol use: Yes    Comment: socially   Drug use: No    Sexual activity: Yes  Other Topics Concern   Not on file  Social History Narrative   Not on file   Social Determinants of Health   Financial Resource Strain: Not on file  Food Insecurity: No Food Insecurity (03/21/2023)   Hunger Vital Sign    Worried About Running Out of Food in the Last Year: Never true    Ran Out of Food in the Last Year: Never true  Transportation Needs: No Transportation Needs (03/21/2023)   PRAPARE - Administrator, Civil Service (Medical): No    Lack of Transportation (Non-Medical): No  Physical Activity: Not on file  Stress: Not on file  Social Connections: Not on file  Intimate Partner Violence: Not At Risk (03/21/2023)   Humiliation, Afraid, Rape, and Kick questionnaire  Fear of Current or Ex-Partner: No    Emotionally Abused: No    Physically Abused: No    Sexually Abused: No    Review of Systems Constitutional: Patient reports unintentional weight change  lntegumentary: Patient denies any rashes or pruritus Cardiovascular: Patient denies chest pain or syncope Respiratory: Patient denies shortness of breath Gastrointestinal: Patient denies nausea, vomiting, constipation, or diarrhea Musculoskeletal: Patient denies muscle cramps or weakness Neurologic: Patient reports intermittent dizziness for past several months Psychiatric: Patient denies memory problems Allergic/Immunologic: Patient denies recent allergic reaction(s) Hematologic/Lymphatic: Patient denies bleeding tendencies Endocrine: Patient denies heat/cold intolerance  GU: As per HPI.  OBJECTIVE Vitals:   06/19/23 1140 06/19/23 1224  BP: (!) 155/102 (!) 149/107  Pulse: 75 65  Temp: (!) 97.5 F (36.4 C)    There is no height or weight on file to calculate BMI.  Physical Examination Constitutional: No obvious distress; patient is non-toxic appearing  Cardiovascular: No visible lower extremity edema.  Respiratory: The patient does not have audible wheezing/stridor;  respirations do not appear labored  Gastrointestinal: Abdomen non-distended Musculoskeletal: Normal ROM of UEs  Skin: No obvious rashes/open sores  Neurologic: CN 2-12 grossly intact Psychiatric: Answered questions appropriately with normal affect  Hematologic/Lymphatic/Immunologic: No obvious bruises or sites of spontaneous bleeding  UA: negative  PVR: 31 ml  ASSESSMENT Hematuria, unspecified type - Plan: Urinalysis, Routine w reflex microscopic, BLADDER SCAN AMB NON-IMAGING  Benign prostatic hyperplasia with nocturia - Plan: Urinalysis, Routine w reflex microscopic, BLADDER SCAN AMB NON-IMAGING  Erectile dysfunction, unspecified erectile dysfunction type - Plan: Urinalysis, Routine w reflex microscopic, BLADDER SCAN AMB NON-IMAGING, Testosterone, CBC, AMBULATORY NON FORMULARY MEDICATION  1. BPH: Flomax removed from medication list today. Patient not taking at this time; no voiding complaints; advised that this medication could worsen his pre-existing dizziness.   2. Erectile dysfunction. Thorough discussion was had with patient regarding possible etiologies of erectile dysfunction including psychological, vasculogenic, neurologic, and testosterone deficiency. We reviewed options for therapy including medication, vacuum erection device (VED; AKA "penis pump"), penile injections, MUSE, and penile implants. Patient was advised that not all these options are likely to be covered by insurance.  - We agreed to check testosterone levels today. He was advised that if his testosterone is found to be low that cardiac clearance would be required prior to initiation of testosterone replacement therapy for hypogonadism and that he would be required to follow up for a nurse visit to teach self-administration of testosterone injections. We discussed potential risks and benefits of testosterone replacement including erythrocytosis / blood clots.  - Printed order given to patient for VED in hopes that may be  covered under DME.  - Patient declined refills for Viagra due to ineffectiveness.  - Offered new prescription for intracorporeal injections; he declined due to cost.  - Advised to discuss his erectile dysfunction at upcoming endocrinology appointment also.   Pt verbalized understanding and agreement. He requested to be scheduled with Dr. Ronne Binning (1st available appointment) for follow up. All questions were answered.  PLAN Advised the following: 1. Labs today (testosterone & CBC). 2. Return for 1st available f/u with Dr. Ronne Binning per pt request.  Orders Placed This Encounter  Procedures   Urinalysis, Routine w reflex microscopic   Testosterone   CBC   BLADDER SCAN AMB NON-IMAGING   Total time spent caring for the patient today was over 30 minutes. This includes time spent on the date of the visit reviewing the patient's chart before the visit, time spent during the  visit, and time spent after the visit on documentation. Over 50% of that time was spent in face-to-face time with this patient for direct counseling. E&M based on time and complexity of medical decision making.  It has been explained that the patient is to follow regularly with their PCP in addition to all other providers involved in their care and to follow instructions provided by these respective offices. Patient advised to contact urology clinic if any urologic-pertaining questions, concerns, new symptoms or problems arise in the interim period.  There are no Patient Instructions on file for this visit.  Electronically signed by:  Donnita Falls, FNP   06/19/23    12:35 PM

## 2023-06-19 ENCOUNTER — Encounter: Payer: Self-pay | Admitting: Internal Medicine

## 2023-06-19 ENCOUNTER — Ambulatory Visit (INDEPENDENT_AMBULATORY_CARE_PROVIDER_SITE_OTHER): Payer: 59 | Admitting: Internal Medicine

## 2023-06-19 ENCOUNTER — Encounter: Payer: Self-pay | Admitting: Urology

## 2023-06-19 ENCOUNTER — Ambulatory Visit (INDEPENDENT_AMBULATORY_CARE_PROVIDER_SITE_OTHER): Payer: 59 | Admitting: Urology

## 2023-06-19 VITALS — BP 149/107 | HR 65 | Temp 97.5°F

## 2023-06-19 VITALS — BP 148/92 | HR 69 | Resp 18 | Ht 66.0 in | Wt 160.2 lb

## 2023-06-19 DIAGNOSIS — R319 Hematuria, unspecified: Secondary | ICD-10-CM

## 2023-06-19 DIAGNOSIS — G4710 Hypersomnia, unspecified: Secondary | ICD-10-CM | POA: Insufficient documentation

## 2023-06-19 DIAGNOSIS — N529 Male erectile dysfunction, unspecified: Secondary | ICD-10-CM | POA: Diagnosis not present

## 2023-06-19 DIAGNOSIS — I10 Essential (primary) hypertension: Secondary | ICD-10-CM

## 2023-06-19 DIAGNOSIS — R351 Nocturia: Secondary | ICD-10-CM

## 2023-06-19 DIAGNOSIS — E0590 Thyrotoxicosis, unspecified without thyrotoxic crisis or storm: Secondary | ICD-10-CM | POA: Diagnosis not present

## 2023-06-19 DIAGNOSIS — N401 Enlarged prostate with lower urinary tract symptoms: Secondary | ICD-10-CM

## 2023-06-19 DIAGNOSIS — Z23 Encounter for immunization: Secondary | ICD-10-CM | POA: Diagnosis not present

## 2023-06-19 LAB — URINALYSIS, ROUTINE W REFLEX MICROSCOPIC
Bilirubin, UA: NEGATIVE
Glucose, UA: NEGATIVE
Ketones, UA: NEGATIVE
Leukocytes,UA: NEGATIVE
Nitrite, UA: NEGATIVE
Protein,UA: NEGATIVE
RBC, UA: NEGATIVE
Specific Gravity, UA: 1.015 (ref 1.005–1.030)
Urobilinogen, Ur: 1 mg/dL (ref 0.2–1.0)
pH, UA: 6.5 (ref 5.0–7.5)

## 2023-06-19 LAB — BLADDER SCAN AMB NON-IMAGING: Scan Result: 31

## 2023-06-19 MED ORDER — LOSARTAN POTASSIUM 25 MG PO TABS
25.0000 mg | ORAL_TABLET | Freq: Every day | ORAL | 3 refills | Status: DC
Start: 2023-06-19 — End: 2023-08-10

## 2023-06-19 MED ORDER — AMBULATORY NON FORMULARY MEDICATION
0 refills | Status: AC
Start: 2023-06-19 — End: ?

## 2023-06-19 NOTE — Patient Instructions (Signed)
Please start taking Losartan 25 mg once daily for blood pressure.  Please take Gabapentin only at nighttime. Please take Tizanidine only as needed for muscle spasms, preferably in the evening. Do not drive for 02-72 hours after taking Tizanidine.

## 2023-06-19 NOTE — Progress Notes (Signed)
Acute Office Visit  Subjective:    Patient ID: Kirk Oconnor, male    DOB: [birth year], 67 y.o.   MRN: [id number]  Chief Complaint  Patient presents with   Hypertension    Concerned for high blood pressure    HPI Patient is in today for concern of persistently elevated BP.  He was seen at urology clinic today and had BP - 149/107.  He has been recently diagnosed with hyperthyroidism and has started treatment with methimazole.  He also reports intermittent headache, but denies any chest pain, dyspnea or palpitations.  He is currently taking amlodipine 10 mg QD.  He also reports daytime fatigue and drowsiness.  Of note, he currently takes gabapentin 300 mg 3 times daily and tizanidine as needed for muscle spasms.  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   Arthritis    CAD (coronary artery disease)    Nonobstructive at cardiac catheterization July 2018 - [facility]   Carpal tunnel syndrome    Depression    Diabetes mellitus without complication (HCC)    Essential hypertension    GERD (gastroesophageal reflux disease)    Hepatitis C    Sleep apnea     Past Surgical History:  Procedure Laterality Date   AORTIC VALVE REPLACEMENT  03/2017   [facility]   BIOPSY  12/15/2022   Procedure: BIOPSY;  Surgeon: Lanelle Bal, DO;  [facility];  Service: Endoscopy;;   COLONOSCOPY WITH PROPOFOL N/A 12/15/2022   Procedure: COLONOSCOPY WITH PROPOFOL;  Surgeon: Lanelle Bal, DO;  [facility];  Service: Endoscopy;  Laterality: N/A;  10:00 am, asa 3   ESOPHAGOGASTRODUODENOSCOPY (EGD) WITH PROPOFOL N/A 12/15/2022   Procedure: ESOPHAGOGASTRODUODENOSCOPY (EGD) WITH PROPOFOL;  Surgeon: Lanelle Bal, DO;   [facility];  Service: Endoscopy;  Laterality: N/A;   POLYPECTOMY  12/15/2022   Procedure: POLYPECTOMY;  Surgeon: Lanelle Bal, DO;  [facility];  Service: Endoscopy;;    Family History  Problem Relation Age of Onset   Diabetes Mother    Heart attack Mother    Heart attack Father     Social History   Socioeconomic History   Marital status: Divorced    Spouse name: Not on file   Number of children: Not on file   Years of education: Not on file   Highest education level: Not on file  Occupational History   Not on file  Tobacco Use   Smoking status: Some Days    Types: Cigarettes   Smokeless tobacco: Never  Vaping Use   Vaping status: Never Used  Substance and Sexual Activity   Alcohol use: Yes    Comment: socially   Drug use: No   Sexual activity: Yes  Other Topics Concern   Not on file  Social History Narrative   Not on file   Social Determinants of Health   Financial Resource Strain: Not on file  Food Insecurity: No Food Insecurity (03/21/2023)   Hunger Vital Sign    Worried About Running Out of Food in the Last Year: Never true    Ran Out of Food in the Last Year: Never true  Transportation Needs: No Transportation Needs (03/21/2023)  PRAPARE - Administrator, Civil Service (Medical): No    Lack of Transportation (Non-Medical): No  Physical Activity: Not on file  Stress: Not on file  Social Connections: Not on file  Intimate Partner Violence: Not At Risk (03/21/2023)   Humiliation, Afraid, Rape, and Kick questionnaire    Fear of Current or Ex-Partner: No    Emotionally Abused: No    Physically Abused: No    Sexually Abused: No    Outpatient Medications Prior to Visit  Medication Sig Dispense Refill   acetaminophen (TYLENOL) 500 MG tablet Take 500 mg by mouth every 6 (six) hours as needed.     AMBULATORY NON FORMULARY MEDICATION Vacuum erection device. Dispense 1. 1 each 0   amLODipine (NORVASC) 10 MG tablet Take 1  tablet (10 mg total) by mouth daily. 30 tablet 0   aspirin EC 81 MG tablet Take 81 mg by mouth daily.     gabapentin (NEURONTIN) 300 MG capsule Take 1 capsule (300 mg total) by mouth every 8 (eight) hours as needed. 90 capsule 3   lidocaine (LIDODERM) 5 % Place 1 patch onto the skin daily. Remove & Discard patch within 12 hours or as directed by MD 30 patch 4   meclizine (ANTIVERT) 25 MG tablet Take 1 tablet (25 mg total) by mouth 2 (two) times daily as needed for dizziness. 30 tablet 0   metFORMIN (GLUCOPHAGE) 500 MG tablet Take 1 tablet (500 mg total) by mouth daily with breakfast. 90 tablet 1   methimazole (TAPAZOLE) 5 MG tablet Take 1 tablet (5 mg total) by mouth daily with breakfast. 90 tablet 1   omeprazole (PRILOSEC) 40 MG capsule Take 1 capsule (40 mg total) by mouth 2 (two) times daily. 60 capsule 11   rosuvastatin (CRESTOR) 5 MG tablet Take 1 tablet (5 mg total) by mouth daily. 90 tablet 3   sildenafil (VIAGRA) 100 MG tablet Take 1 tablet (100 mg total) by mouth daily as needed for erectile dysfunction. 30 tablet 1   tiZANidine (ZANAFLEX) 4 MG tablet Take 1 tablet (4 mg total) by mouth every 8 (eight) hours as needed for muscle spasms. 30 tablet 1   No facility-administered medications prior to visit.    No Known Allergies  Review of Systems  Constitutional:  Positive for fatigue. Negative for chills and fever.  HENT:  Negative for congestion and sore throat.   Eyes:  Negative for pain and discharge.  Respiratory:  Negative for cough and shortness of breath.   Cardiovascular:  Negative for chest pain and palpitations.  Gastrointestinal:  Negative for diarrhea, nausea and vomiting.  Endocrine: Negative for polydipsia and polyuria.  Genitourinary:  Negative for dysuria and hematuria.  Musculoskeletal:  Negative for neck pain and neck stiffness.  Skin:  Negative for rash.  Neurological:  Positive for dizziness and headaches. Negative for weakness and numbness.   Psychiatric/Behavioral:  Negative for agitation and behavioral problems.        Objective:    Physical Exam Vitals reviewed.  Constitutional:      General: He is not in acute distress.    Appearance: He is not diaphoretic.     Comments: Drowsy  HENT:     Head: Normocephalic and atraumatic.  Eyes:     General: No scleral icterus.    Extraocular Movements: Extraocular movements intact.  Cardiovascular:     Rate and Rhythm: Normal rate and regular rhythm.     Heart sounds: Normal heart sounds. No murmur heard.  Pulmonary:     Breath sounds: Normal breath sounds. No wheezing or rales.  Musculoskeletal:     Cervical back: Neck supple. No tenderness.     Right lower leg: No edema.     Left lower leg: No edema.  Skin:    General: Skin is warm.     Findings: No rash.  Neurological:     General: No focal deficit present.     Mental Status: He is oriented to person, place, and time.  Psychiatric:        Mood and Affect: Mood normal.        Behavior: Behavior normal. Behavior is cooperative.     BP (!) 148/92 (BP Location: Right Arm)   Pulse 69   Resp 18   Ht 5\' 6"  (1.676 m)   Wt 160 lb 3.2 oz (72.7 kg)   SpO2 98%   BMI 25.86 kg/m  Wt Readings from Last 3 Encounters:  06/19/23 160 lb 3.2 oz (72.7 kg)  06/06/23 158 lb 9.6 oz (71.9 kg)  05/24/23 155 lb 12.8 oz (70.7 kg)        Assessment & Plan:   Problem List Items Addressed This Visit       Cardiovascular and Mediastinum   Essential hypertension, benign - Primary    BP Readings from Last 1 Encounters:  06/19/23 (!) 148/92   Uncontrolled with amlodipine 10 mg QD Added losartan 25 mg QD Would have preferred to add propranolol if heart rate was > 80 considering his hyperthyroidism Counseled for compliance with the medications Advised DASH diet and moderate exercise/walking, at least 150 mins/week       Relevant Medications   losartan (COZAAR) 25 MG tablet     Endocrine   Hyperthyroidism    Recently  started methimazole Likely contributing to elevated BP        Other   Hypersomnolence    Likely due to polypharmacy Advised to avoid taking gabapentin and tizanidine together Avoid daytime intake of gabapentin and tizanidine      Other Visit Diagnoses     Encounter for immunization       Relevant Orders   Flu Vaccine Trivalent High Dose (Fluad) (Completed)        Meds ordered this encounter  Medications   losartan (COZAAR) 25 MG tablet    Sig: Take 1 tablet (25 mg total) by mouth daily.    Dispense:  30 tablet    Refill:  3     Lyndsey Demos Concha Se, MD

## 2023-06-19 NOTE — Assessment & Plan Note (Addendum)
BP Readings from Last 1 Encounters:  06/19/23 (!) 148/92   Uncontrolled with amlodipine 10 mg QD Added losartan 25 mg QD Would have preferred to add propranolol if heart rate was > 80 considering his hyperthyroidism Counseled for compliance with the medications Advised DASH diet and moderate exercise/walking, at least 150 mins/week

## 2023-06-19 NOTE — Assessment & Plan Note (Signed)
Recently started methimazole Likely contributing to elevated BP

## 2023-06-19 NOTE — Assessment & Plan Note (Signed)
Likely due to polypharmacy Advised to avoid taking gabapentin and tizanidine together Avoid daytime intake of gabapentin and tizanidine

## 2023-06-20 ENCOUNTER — Telehealth: Payer: Self-pay

## 2023-06-20 ENCOUNTER — Telehealth: Payer: Self-pay | Admitting: Urology

## 2023-06-20 LAB — CBC
Hematocrit: 44.5 % (ref 37.5–51.0)
Hemoglobin: 14.7 g/dL (ref 13.0–17.7)
MCH: 29.4 pg (ref 26.6–33.0)
MCHC: 33 g/dL (ref 31.5–35.7)
MCV: 89 fL (ref 79–97)
Platelets: 196 10*3/uL (ref 150–450)
RBC: 5 x10E6/uL (ref 4.14–5.80)
RDW: 13.8 % (ref 11.6–15.4)
WBC: 7 10*3/uL (ref 3.4–10.8)

## 2023-06-20 LAB — TESTOSTERONE: Testosterone: 706 ng/dL (ref 264–916)

## 2023-06-20 NOTE — Telephone Encounter (Signed)
Discussed with patient and documented in other telephone encounter.

## 2023-06-20 NOTE — Telephone Encounter (Signed)
Please see below and advise.  Follow up scheduled for 11/15 with Dr. Ronne Binning

## 2023-06-20 NOTE — Telephone Encounter (Signed)
-----   Message from Donnita Falls sent at 06/20/2023  9:09 AM EDT ----- Please let pt know his testosterone level is within the normal range, so that is not contributory to his erectile dysfunction..  - Advised to discuss his erectile dysfunction at upcoming endocrinology appointment to determine if any other hormonal evaluation is indicated.  - Follow up with Dr. Ronne Binning as scheduled on 08/21/2023.

## 2023-06-20 NOTE — Telephone Encounter (Signed)
Patient is calling for his lab results from yesterday

## 2023-06-20 NOTE — Telephone Encounter (Signed)
Patient is aware of Sarah's response to labs and her recommendations.  He will follow up with MD as scheduled.

## 2023-07-05 NOTE — Telephone Encounter (Signed)
Message received from provider regarding patient's need for Social Work and Care Management services. Unable to connect with patient today. Will attempt outreach again this week.   [facility]/Care Management [phone]

## 2023-07-05 NOTE — Telephone Encounter (Signed)
Error Please Disregard

## 2023-07-06 ENCOUNTER — Other Ambulatory Visit: Payer: 59 | Admitting: *Deleted

## 2023-07-06 ENCOUNTER — Encounter: Payer: Self-pay | Admitting: Gastroenterology

## 2023-07-06 NOTE — Patient Instructions (Signed)
Visit Information  Kirk Oconnor was given information about Medicaid Managed Care team care coordination services as a part of their [facility] Plan Medicaid benefit. Kirk Oconnor verbally consented to engagement with the [facility] Managed Care team.   If you are experiencing a medical emergency, please call 911 or report to your local emergency department or urgent care.   If you have a non-emergency medical problem during routine business hours, please contact your provider's office and ask to speak with a nurse.   For questions related to your [facility], please call: [phone] or visit the homepage here: [email or web address]  If you would like to schedule transportation through your [facility], please call the following number at least 2 days in advance of your appointment: [phone]   Rides for urgent appointments can also be made after hours by calling Member Services.  Call the Behavioral Health Crisis Line at [phone], at any time, 24 hours a day, 7 days a week. If you are in danger or need immediate medical attention call 911.  If you would like help to quit smoking, call 1-800-QUIT-NOW ((769)325-6116) OR Espaol: 1-855-Djelo-Ya (0-102-725-3664) o para ms informacin haga clic aqu or Text READY to 403-474 to register via text  Kirk Oconnor,   Please see education materials related to HTN provided by MyChart link.  Patient verbalizes understanding of instructions and care plan provided today and agrees to view in MyChart. Active MyChart status and patient understanding of how to access instructions and care plan via MyChart confirmed with patient.     Telephone follow up appointment with Managed Medicaid care management team member scheduled for:08/08/23 at 2:30pm  [name] RN, BSN [place]  [facility] RN  Care Coordinator [phone]   Following is a copy of your plan of care:  Care Plan : RN Care Manager Plan of Care  Updates made by Heidi Dach, RN since 07/06/2023 12:00 AM     Problem: Health Management needs related to Chronic Pain      Long-Range Goal: Development of Plan of Care to address Health Management needs related to Chronic Pain   Start Date: 03/21/2023  Expected End Date: 08/25/2023  Note:   Current Barriers:  Chronic Disease Management support and education needs related to Chronic Pain  RNCM Clinical Goal(s):  Patient will verbalize understanding of plan for management of Chronic Pain as evidenced by patient reports take all medications exactly as prescribed and will call provider for medication related questions as evidenced by patient reports    attend all scheduled medical appointments:   07/12/23 with PCP as evidenced by provider documentation in EMR        continue to work with RN Care Manager and/or Social Worker to address care management and care coordination needs related to chronic pain as evidenced by adherence to CM Team Scheduled appointments        Interventions: Inter-disciplinary care team collaboration (see longitudinal plan of care) Evaluation of current treatment plan related to  self management and patient's adherence to plan as established by provider   Hypertension Interventions:  (Status:  New goal.) Long Term Goal Last practice recorded BP readings:  BP Readings from Last 3 Encounters:  06/19/23 (!) 148/92  06/19/23 (!) 149/107  06/06/23 (!) 146/92   Most recent eGFR/CrCl: No results found for: "EGFR"  No components found for: "CRCL"  Evaluation of current treatment plan related to hypertension self management and  patient's adherence to plan as established by provider Reviewed medications with patient and discussed importance of compliance Discussed plans with patient for ongoing care management follow up and provided patient with  direct contact information for care management team Reviewed scheduled/upcoming provider appointments including: 07/12/23 with PCP Advised patient to take all medications to PCP appointment for medication reconciliation, medication discrepancy noted with amlodipine Advised patient to exercise 30 minutes daily  Advised patient to focus on what he can do and not what he can't do Advised patient to keep a journal and discuss any concerns or questions with Provider   Pain:  (Status: Goal Met.) Long Term Goal  Pain assessment performed-5/10, which is improved Medications reviewed-discussed possible drowsiness with Meclizine and Tizanidine  Reviewed provider established plan for pain management; Discussed importance of adherence to all scheduled medical appointments; Counseled on the importance of reporting any/all new or changed pain symptoms or management strategies to pain management provider; Advised patient to report to care team affect of pain on daily activities; Discussed use of relaxation techniques and/or diversional activities to assist with pain reduction (distraction, imagery, relaxation, massage, acupressure, TENS, heat, and cold application; Reviewed with patient prescribed pharmacological and nonpharmacological pain relief strategies; Assessed social determinant of health barriers;  Reviewed notes from [facility], PCP and Dr. Fransico Him and discussed with patient Advised patient to contact pharmacy for rosuvastatin and lidocaine patches  Patient Goals/Self-Care Activities: Take medications as prescribed   Attend all scheduled provider appointments Call provider office for new concerns or questions  Work with the social worker to address care coordination needs and will continue to work with the clinical team to address health care and disease management related needs

## 2023-07-06 NOTE — Patient Outreach (Signed)
Medicaid Managed Care   Nurse Care Manager Note  07/06/2023 Name:  Kirk Oconnor MRN:  [id number] DOB:  05-09-1956  Kirk Oconnor is an 67 y.o. year old male who is a primary patient of Del Nigel Berthold, FNP.  The [facility] Managed Care Coordination team was consulted for assistance with:    HTN Pain  Kirk Oconnor was given information about Medicaid Managed Care Coordination team services today. Kirk Oconnor Patient agreed to services and verbal consent obtained.  Engaged with patient by telephone for follow up visit in response to provider referral for case management and/or care coordination services.   Assessments/Interventions:  Review of past medical history, allergies, medications, health status, including review of consultants reports, laboratory and other test data, was performed as part of comprehensive evaluation and provision of chronic care management services.  SDOH (Social Determinants of Health) assessments and interventions performed: SDOH Interventions    Flowsheet Row Patient Outreach Telephone from 03/21/2023 in [place] [facility]PART[facility] Office Visit from 03/01/2023 in [facility] [place] Office Visit from 01/20/2023 in [facility]  SDOH Interventions     Food Insecurity Interventions Intervention Not Indicated -- --  Housing Interventions Other (Comment)  [Patient is scheduled with BSW on 03/23/23] AMB Referral --  Transportation Interventions Intervention Not Indicated -- --  Utilities Interventions Intervention Not Indicated -- --  Depression Interventions/Treatment  -- -- Counseling       Care Plan  No Known Allergies  Medications Reviewed Today     Reviewed by Heidi Dach, RN (Registered Nurse) on 07/06/23 at 1508  Med List Status: <None>   Medication Order Taking? Sig Documenting Provider Last Dose Status Informant  acetaminophen (TYLENOL) 500 MG tablet [id number] Yes Take 500 mg by mouth every  6 (six) hours as needed. [provider] Taking Active   AMBULATORY NON FORMULARY MEDICATION [id number] Yes Vacuum erection device. Dispense 1. Donnita Falls, FNP Taking Active   amLODipine (NORVASC) 10 MG tablet [id number] Yes Take 1 tablet (10 mg total) by mouth daily. Smoot, Shawn Route, PA-C Taking Active            Med Note (Izzac Rockett A   Thu Jul 06, 2023  3:07 PM) Patient is taking 5 mg tablets as filled from pharmacy  aspirin EC 81 MG tablet [id number] Yes Take 81 mg by mouth daily. [provider] Taking Active Family Member  gabapentin (NEURONTIN) 300 MG capsule [id number] Yes Take 1 capsule (300 mg total) by mouth every 8 (eight) hours as needed. Del Newman Nip, Tenna Child, FNP Taking Active   lidocaine (LIDODERM) 5 % [id number] No Place 1 patch onto the skin daily. Remove & Discard patch within 12 hours or as directed by MD  Patient not taking: Reported on 07/06/2023   Del Nigel Berthold, FNP Not Taking Active   losartan (COZAAR) 25 MG tablet [id number] Yes Take 1 tablet (25 mg total) by mouth daily. Anabel Halon, MD Taking Active   meclizine (ANTIVERT) 25 MG tablet [id number] Yes Take 1 tablet (25 mg total) by mouth 2 (two) times daily as needed for dizziness. Anabel Halon, MD Taking Active   metFORMIN (GLUCOPHAGE) 500 MG tablet [id number] Yes Take 1 tablet (500 mg total) by mouth daily with breakfast. Del Newman Nip, Tenna Child, FNP Taking Active   methimazole (TAPAZOLE) 5 MG tablet [id number] Yes Take 1 tablet (5 mg total) by mouth daily with breakfast. Roma Kayser, MD Taking Active  omeprazole (PRILOSEC) 40 MG capsule [id number] No Take 1 capsule (40 mg total) by mouth 2 (two) times daily.  Patient not taking: Reported on 07/06/2023   Lanelle Bal, DO Not Taking Active            Med Note Ephriam Knuckles, [facility]   Mon Mar 27, 2023  3:32 PM)    rosuvastatin (CRESTOR) 5 MG tablet [id number] No Take 1 tablet (5 mg total) by mouth daily.  Patient not taking:  Reported on 07/06/2023   [name], FNP Not Taking Active   sildenafil (VIAGRA) 100 MG tablet [id number] Yes Take 1 tablet (100 mg total) by mouth daily as needed for erectile dysfunction. Donnita Falls, FNP Taking Active   tiZANidine (ZANAFLEX) 4 MG tablet [id number] Yes Take 1 tablet (4 mg total) by mouth every 8 (eight) hours as needed for muscle spasms. Del Nigel Berthold, FNP Taking Active             Patient Active Problem List   Diagnosis Date Noted   Hypersomnolence 06/19/2023   Dizziness 06/06/2023   Hematuria 06/06/2023   Left anterior fascicular block (LAFB) 06/06/2023   Lumbar pain 04/25/2023   Hypertension 04/25/2023   Current smoker 04/05/2023   Hyponatremia 03/27/2023   Hyperthyroidism 03/27/2023   Erectile dysfunction 02/28/2023   Type 2 diabetes mellitus (HCC) 01/20/2023   Nocturia 01/18/2023   Unexplained weight loss 01/18/2023   Hepatic cirrhosis (HCC) 07/20/2022   Hyperglycemia 07/11/2017   Thrombocytopenia (HCC) 07/11/2017   Chronic back pain 06/26/2017   COPD (chronic obstructive pulmonary disease) (HCC) 04/14/2017   CVA (cerebral vascular accident) (HCC) 04/14/2017   History of alcohol abuse 04/14/2017   History of cocaine abuse (HCC) 04/14/2017   OSA (obstructive sleep apnea) 04/14/2017   BPH (benign prostatic hyperplasia) 10/22/2016   GERD (gastroesophageal reflux disease) 10/22/2016   Aortic root dilation (HCC) 10/21/2016   Acute diastolic CHF (congestive heart failure) (HCC) 10/21/2016   Essential hypertension, benign 03/27/2013   Hepatitis C 03/27/2013   Snoring 03/27/2013    Conditions to be addressed/monitored per PCP order:  HTN and Pain  Care Plan : RN Care Manager Plan of Care  Updates made by Heidi Dach, RN since 07/06/2023 12:00 AM     Problem: Health Management needs related to Chronic Pain      Long-Range Goal: Development of Plan of Care to address Health Management needs related to Chronic Pain    Start Date: 03/21/2023  Expected End Date: 08/25/2023  Note:   Current Barriers:  Chronic Disease Management support and education needs related to Chronic Pain  RNCM Clinical Goal(s):  Patient will verbalize understanding of plan for management of Chronic Pain as evidenced by patient reports take all medications exactly as prescribed and will call provider for medication related questions as evidenced by patient reports    attend all scheduled medical appointments:   07/12/23 with PCP as evidenced by provider documentation in EMR        continue to work with RN Care Manager and/or Social Worker to address care management and care coordination needs related to chronic pain as evidenced by adherence to CM Team Scheduled appointments        Interventions: Inter-disciplinary care team collaboration (see longitudinal plan of care) Evaluation of current treatment plan related to  self management and patient's adherence to plan as established by provider   Hypertension Interventions:  (Status:  New goal.) Long Term Goal Last practice recorded BP readings:  BP Readings from Last 3 Encounters:  06/19/23 (!) 148/92  06/19/23 (!) 149/107  06/06/23 (!) 146/92   Most recent eGFR/CrCl: No results found for: "EGFR"  No components found for: "CRCL"  Evaluation of current treatment plan related to hypertension self management and patient's adherence to plan as established by provider Reviewed medications with patient and discussed importance of compliance Discussed plans with patient for ongoing care management follow up and provided patient with direct contact information for care management team Reviewed scheduled/upcoming provider appointments including: 07/12/23 with PCP Advised patient to take all medications to PCP appointment for medication reconciliation, medication discrepancy noted with amlodipine Advised patient to exercise 30 minutes daily  Advised patient to focus on what he can do and  not what he can't do Advised patient to keep a journal and discuss any concerns or questions with Provider   Pain:  (Status: Goal Met.) Long Term Goal  Pain assessment performed-5/10, which is improved Medications reviewed-discussed possible drowsiness with Meclizine and Tizanidine  Reviewed provider established plan for pain management; Discussed importance of adherence to all scheduled medical appointments; Counseled on the importance of reporting any/all new or changed pain symptoms or management strategies to pain management provider; Advised patient to report to care team affect of pain on daily activities; Discussed use of relaxation techniques and/or diversional activities to assist with pain reduction (distraction, imagery, relaxation, massage, acupressure, TENS, heat, and cold application; Reviewed with patient prescribed pharmacological and nonpharmacological pain relief strategies; Assessed social determinant of health barriers;  Reviewed notes from [facility], PCP and Dr. Fransico Him and discussed with patient Advised patient to contact pharmacy for rosuvastatin and lidocaine patches  Patient Goals/Self-Care Activities: Take medications as prescribed   Attend all scheduled provider appointments Call provider office for new concerns or questions  Work with the social worker to address care coordination needs and will continue to work with the clinical team to address health care and disease management related needs       Follow Up:  Patient agrees to Care Plan and Follow-up.  Plan: The Managed Medicaid care management team will reach out to the patient again over the next 30 days.  Date/time of next scheduled RN care management/care coordination outreach:  08/08/23 at 2:30pm  [name] RN, BSN [place]  [facility] Health RN Care Coordinator [phone]

## 2023-07-12 ENCOUNTER — Encounter: Payer: Self-pay | Admitting: Family Medicine

## 2023-07-12 ENCOUNTER — Ambulatory Visit (INDEPENDENT_AMBULATORY_CARE_PROVIDER_SITE_OTHER): Payer: 59 | Admitting: Family Medicine

## 2023-07-12 VITALS — BP 133/87 | HR 78 | Resp 16 | Ht 66.0 in | Wt 163.1 lb

## 2023-07-12 DIAGNOSIS — E119 Type 2 diabetes mellitus without complications: Secondary | ICD-10-CM | POA: Diagnosis not present

## 2023-07-12 DIAGNOSIS — F321 Major depressive disorder, single episode, moderate: Secondary | ICD-10-CM | POA: Diagnosis not present

## 2023-07-12 DIAGNOSIS — G8929 Other chronic pain: Secondary | ICD-10-CM

## 2023-07-12 DIAGNOSIS — M5440 Lumbago with sciatica, unspecified side: Secondary | ICD-10-CM

## 2023-07-12 DIAGNOSIS — I10 Essential (primary) hypertension: Secondary | ICD-10-CM | POA: Diagnosis not present

## 2023-07-12 DIAGNOSIS — M545 Low back pain, unspecified: Secondary | ICD-10-CM

## 2023-07-12 MED ORDER — MIRTAZAPINE 7.5 MG PO TABS: 7.5000 mg | ORAL_TABLET | Freq: Every day | ORAL | 2 refills | Status: DC

## 2023-07-12 MED ORDER — TIZANIDINE HCL 4 MG PO TABS: 4.0000 mg | ORAL_TABLET | Freq: Three times a day (TID) | ORAL | 1 refills | Status: DC | PRN

## 2023-07-12 MED ORDER — AMLODIPINE BESYLATE 5 MG PO TABS: 5.0000 mg | ORAL_TABLET | Freq: Every day | ORAL | 3 refills | Status: DC

## 2023-07-12 MED ORDER — METHYLPREDNISOLONE ACETATE 80 MG/ML IJ SUSP
60.0000 mg | Freq: Once | INTRAMUSCULAR | Status: DC
Start: 2023-07-12 — End: 2023-07-12

## 2023-07-12 MED ORDER — METHYLPREDNISOLONE ACETATE 80 MG/ML IJ SUSP
80.0000 mg | Freq: Once | INTRAMUSCULAR | Status: AC
Start: 2023-07-12 — End: 2023-07-12
  Administered 2023-07-12: 80 mg via INTRAMUSCULAR

## 2023-07-12 NOTE — Assessment & Plan Note (Signed)
Vitals:   07/12/23 1001  BP: 133/87  Controlled on Amlodipine 10 mg and Losartan 25 mg Labs ordered. Discussed with  patient to monitor their blood pressure regularly and maintain a heart-healthy diet rich in fruits, vegetables, whole grains, and low-fat dairy, while reducing sodium intake to less than 2,300 mg per day. Regular physical activity, such as 30 minutes of moderate exercise most days of the week, will help lower blood pressure and improve overall cardiovascular health. Avoiding smoking, limiting alcohol consumption, and managing stress. Take  prescribed medication, & take it as directed and avoid skipping doses. Seek emergency care if your blood pressure is (over 180/120) or you experience chest pain, shortness of breath, or sudden vision changes.Patient verbalizes understanding regarding plan of care and all questions answered.

## 2023-07-12 NOTE — Progress Notes (Signed)
Patient Office Visit   Subjective   Patient ID: Kirk Oconnor, male    DOB: 21-Jun-1956  Age: 67 y.o. 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   HPI Flowsheet Row Office Visit from 07/12/2023 in [facility]  PHQ-9 Total Score 13         Outpatient Encounter Medications as of 07/12/2023  Medication Sig   amLODipine (NORVASC) 10 MG tablet Take 1 tablet (10 mg total) by mouth daily.   aspirin EC 81 MG tablet Take 81 mg by mouth daily.   gabapentin (NEURONTIN) 300 MG capsule Take 1 capsule (300 mg total) by mouth every 8 (eight) hours as needed.   losartan (COZAAR) 25 MG tablet Take 1 tablet (25 mg total) by mouth daily.   meclizine (ANTIVERT) 25 MG tablet Take 1 tablet (25 mg total) by mouth 2 (two) times daily as needed for dizziness.   metFORMIN (GLUCOPHAGE) 500 MG tablet Take 1 tablet (500 mg total) by mouth daily with breakfast.   methimazole (TAPAZOLE) 5 MG tablet Take 1 tablet (5 mg total) by mouth daily with breakfast.   mirtazapine (REMERON) 7.5 MG tablet Take 1 tablet (7.5 mg total) by mouth at bedtime.   omeprazole (PRILOSEC) 40 MG capsule Take 1 capsule (40 mg total) by mouth 2 (two) times daily.   sildenafil (VIAGRA) 100 MG tablet Take 1 tablet (100 mg total) by mouth daily as needed for erectile dysfunction.   tiZANidine (ZANAFLEX) 4 MG  tablet Take 1 tablet (4 mg total) by mouth every 8 (eight) hours as needed for muscle spasms.   acetaminophen (TYLENOL) 500 MG tablet Take 500 mg by mouth every 6 (six) hours as needed.   AMBULATORY NON FORMULARY MEDICATION Vacuum erection device. Dispense 1.   lidocaine (LIDODERM) 5 % Place 1 patch onto the skin daily. Remove & Discard patch within 12 hours or as directed by MD (Patient not taking: Reported on 07/06/2023)   rosuvastatin (CRESTOR) 5 MG tablet Take 1 tablet (5 mg total) by mouth daily. (Patient not taking: Reported on 07/06/2023)   Facility-Administered Encounter Medications as of 07/12/2023  Medication   methylPREDNISolone acetate (DEPO-MEDROL) injection 80 mg   [DISCONTINUED] methylPREDNISolone acetate (DEPO-MEDROL) injection 60 mg    Past Surgical History:  Procedure Laterality Date   AORTIC VALVE REPLACEMENT  03/2017   [facility]   BIOPSY  12/15/2022   Procedure: BIOPSY;  Surgeon: Lanelle Bal, DO;  [facility];  Service: Endoscopy;;   COLONOSCOPY WITH PROPOFOL N/A 12/15/2022   Procedure: COLONOSCOPY WITH PROPOFOL;  Surgeon: Lanelle Bal, DO;  Location: [facility];  Service: Endoscopy;  Laterality: N/A;  10:00 am, asa 3   ESOPHAGOGASTRODUODENOSCOPY (EGD) WITH PROPOFOL N/A 12/15/2022   Procedure: ESOPHAGOGASTRODUODENOSCOPY (EGD) WITH PROPOFOL;  Surgeon: Lanelle Bal, DO;  Location: [facility];  Service: Endoscopy;  Laterality: N/A;   POLYPECTOMY  12/15/2022   Procedure: POLYPECTOMY;  Surgeon: Lanelle Bal, DO;  Location: [facility];  Service: Endoscopy;;    Review of Systems  Constitutional:  Negative for chills and fever.  Eyes:  Negative for blurred vision.  Respiratory:  Negative for shortness of breath.   Cardiovascular:  Negative for chest pain.  Gastrointestinal:  Negative for abdominal pain.  Musculoskeletal:  Positive for back pain and myalgias.  Neurological:  Negative for dizziness and headaches.   Psychiatric/Behavioral:  Positive for depression.       Objective    BP 133/87   Pulse 78   Resp 16   Ht 5\' 6"  (1.676 m)   Wt 163 lb 1.9 oz (74 kg)   SpO2 96%   BMI 26.33 kg/m   Physical Exam Vitals reviewed.  Constitutional:      General: He is not in acute distress.    Appearance: Normal appearance. He is not ill-appearing, toxic-appearing or diaphoretic.  HENT:     Head: Normocephalic.  Eyes:     General:        Right eye: No discharge.        Left eye: No discharge.     Conjunctiva/sclera: Conjunctivae normal.  Cardiovascular:     Rate and Rhythm: Normal rate.     Pulses: Normal pulses.     Heart sounds: Normal heart sounds.  Pulmonary:     Effort: Pulmonary effort is normal. No respiratory distress.     Breath sounds: Normal breath sounds.  Abdominal:     General: Bowel sounds are normal.     Palpations: Abdomen is soft.     Tenderness: There is no abdominal tenderness. There is no right CVA tenderness, left CVA tenderness or guarding.  Musculoskeletal:     Cervical back: Normal range of motion.     Lumbar back: Tenderness present. Decreased range of motion. Positive right straight leg raise test and positive left straight leg raise test.  Skin:    General: Skin is warm and dry.     Capillary Refill: Capillary refill takes less than 2 seconds.  Neurological:     Mental Status: He is alert.  Psychiatric:        Mood and Affect: Mood normal.       Assessment & Plan:  Type 2 diabetes mellitus without complication, without long-term current use of insulin (HCC) Assessment & Plan: Last Hemoglobin A1c 6.5  Labs ordered today awaiting results will follow up. Patient reports taking Metformin 500 daily                  DiscussedNonpharmacological interventions such as low carb diet,high in protein, vegetables and fruit discussed. Educated on importance of physical activity 150 minutes per week. Discussed signs and symptoms of hypoglycemia, & hyperglycemia and need  to present to the ED if symptoms occurs.Follow up in 3 months or sooner if needed. Patient verbalizes understanding regarding plan of care and all questions answered. Ophthalmology referral placed , Foot exam within desired limits    Orders: -     Microalbumin / creatinine urine ratio -     Hemoglobin A1c  Primary hypertension Assessment & Plan: Vitals:   07/12/23 1001  BP: 133/87  Controlled on Amlodipine 10 mg and Losartan 25 mg Labs ordered. Discussed with  patient to monitor their blood pressure regularly and maintain a heart-healthy diet rich in fruits, vegetables, whole grains, and low-fat dairy, while reducing sodium intake to less than 2,300 mg per day. Regular  physical activity, such as 30 minutes of moderate exercise most days of the week, will help lower blood pressure and improve overall cardiovascular health. Avoiding smoking, limiting alcohol consumption, and managing stress. Take  prescribed medication, & take it as directed and avoid skipping doses. Seek emergency care if your blood pressure is (over 180/120) or you experience chest pain, shortness of breath, or sudden vision changes.Patient verbalizes understanding regarding plan of care and all questions answered.   Orders: -     Lipid panel  Lumbar pain -     methylPREDNISolone Acetate  Depression, major, single episode, moderate (HCC) Assessment & Plan: Flowsheet Row Office Visit from 07/12/2023 in [facility]  PHQ-9 Total Score 13      Depression with insomnia  Trial on Remeron 7.5 mg at bedtime We discussed several non-pharmacological approaches to managing depression, including:  Establishing a consistent daily routine: This helps create structure and stability. Practicing mindfulness and relaxation techniques: Incorporating meditation, deep breathing exercises, or yoga to manage stress and improve emotional well-being. Engaging in regular physical activity: Aim for at least 30  minutes of exercise most days to boost mood and energy levels. Spending time outdoors: Exposure to natural light and fresh air can improve mental health. Building a support network: Encouraging social connections with friends, family, or support groups to reduce feelings of isolation. Prioritizing a balanced diet: Eating nutrient-rich foods while avoiding excessive amounts of processed foods, sugar, and unhealthy fats. Follow-up is recommended in 4-8 weeks to assess progress, with a referral to behavioral health for further support if needed.    Chronic midline low back pain with sciatica, sciatica laterality unspecified Assessment & Plan: Depro-Medrol 60 mg IM injection given today We discussed the desired effects and potential side effects of the prescribed medication for back pain. Additionally, we reviewed non-pharmacological interventions, including the importance of rest, avoiding twisting, improper bending, and straining the lower back. I demonstrated proper body mechanics to prevent further injury and advised alternating between ice and heat therapy for relief. Stretching exercises for both the back and legs were recommended to improve flexibility and support recovery. The patient was advised to follow up if symptoms worsen or persist. The patient expressed understanding of the treatment plan, and all questions were thoroughly addressed.    Other orders -     Mirtazapine; Take 1 tablet (7.5 mg total) by mouth at bedtime.  Dispense: 30 tablet; Refill: 2    Return in about 4 months (around 11/12/2023), or if symptoms worsen or fail to improve, for chronic follow-up.   Cruzita Lederer Newman Nip, FNP

## 2023-07-12 NOTE — Assessment & Plan Note (Signed)
Depro-Medrol 60 mg IM injection given today We discussed the desired effects and potential side effects of the prescribed medication for back pain. Additionally, we reviewed non-pharmacological interventions, including the importance of rest, avoiding twisting, improper bending, and straining the lower back. I demonstrated proper body mechanics to prevent further injury and advised alternating between ice and heat therapy for relief. Stretching exercises for both the back and legs were recommended to improve flexibility and support recovery. The patient was advised to follow up if symptoms worsen or persist. The patient expressed understanding of the treatment plan, and all questions were thoroughly addressed.

## 2023-07-12 NOTE — Patient Instructions (Signed)

## 2023-07-12 NOTE — Assessment & Plan Note (Signed)
Last Hemoglobin A1c 6.5  Labs ordered today awaiting results will follow up. Patient reports taking Metformin 500 daily                  DiscussedNonpharmacological interventions such as low carb diet,high in protein, vegetables and fruit discussed. Educated on importance of physical activity 150 minutes per week. Discussed signs and symptoms of hypoglycemia, & hyperglycemia and need to present to the ED if symptoms occurs.Follow up in 3 months or sooner if needed. Patient verbalizes understanding regarding plan of care and all questions answered. Ophthalmology referral placed , Foot exam within desired limits

## 2023-07-12 NOTE — Assessment & Plan Note (Signed)
Flowsheet Row Office Visit from 07/12/2023 in [facility]  PHQ-9 Total Score 13      Depression with insomnia  Trial on Remeron 7.5 mg at bedtime We discussed several non-pharmacological approaches to managing depression, including:  Establishing a consistent daily routine: This helps create structure and stability. Practicing mindfulness and relaxation techniques: Incorporating meditation, deep breathing exercises, or yoga to manage stress and improve emotional well-being. Engaging in regular physical activity: Aim for at least 30 minutes of exercise most days to boost mood and energy levels. Spending time outdoors: Exposure to natural light and fresh air can improve mental health. Building a support network: Encouraging social connections with friends, family, or support groups to reduce feelings of isolation. Prioritizing a balanced diet: Eating nutrient-rich foods while avoiding excessive amounts of processed foods, sugar, and unhealthy fats. Follow-up is recommended in 4-8 weeks to assess progress, with a referral to behavioral health for further support if needed.

## 2023-07-14 ENCOUNTER — Telehealth: Payer: Self-pay | Admitting: Family Medicine

## 2023-07-14 LAB — LIPID PANEL
Chol/HDL Ratio: 3.9 {ratio} (ref 0.0–5.0)
Cholesterol, Total: 146 mg/dL (ref 100–199)
HDL: 37 mg/dL — ABNORMAL LOW (ref >39)
LDL Chol Calc (NIH): 88 mg/dL (ref 0–99)
Triglycerides: 112 mg/dL (ref 0–149)
VLDL Cholesterol Cal: 21 mg/dL (ref 5–40)

## 2023-07-14 LAB — HEMOGLOBIN A1C
Est. average glucose Bld gHb Est-mCnc: 143 mg/dL
Hgb A1c MFr Bld: 6.6 % — ABNORMAL HIGH (ref 4.8–5.6)

## 2023-07-14 LAB — MICROALBUMIN / CREATININE URINE RATIO
Creatinine, Urine: 133 mg/dL
Microalb/Creat Ratio: 14 mg/g{creat} (ref 0–29)
Microalbumin, Urine: 18.6 ug/mL

## 2023-07-14 NOTE — Telephone Encounter (Signed)
Patient calling about lab results asking for a call back today.

## 2023-07-17 ENCOUNTER — Other Ambulatory Visit: Payer: Self-pay

## 2023-07-17 MED ORDER — GABAPENTIN 300 MG PO CAPS: 300.0000 mg | ORAL_CAPSULE | Freq: Three times a day (TID) | ORAL | 3 refills | Status: DC | PRN

## 2023-07-17 MED ORDER — AMLODIPINE BESYLATE 5 MG PO TABS: 5.0000 mg | ORAL_TABLET | Freq: Every day | ORAL | 3 refills | Status: DC

## 2023-07-27 ENCOUNTER — Encounter: Payer: Self-pay | Admitting: Nurse Practitioner

## 2023-07-27 ENCOUNTER — Ambulatory Visit: Payer: 59 | Attending: Nurse Practitioner | Admitting: Nurse Practitioner

## 2023-07-27 VITALS — BP 175/90 | HR 88 | Ht 68.0 in | Wt 161.2 lb

## 2023-07-27 DIAGNOSIS — I10 Essential (primary) hypertension: Secondary | ICD-10-CM | POA: Diagnosis not present

## 2023-07-27 DIAGNOSIS — Z952 Presence of prosthetic heart valve: Secondary | ICD-10-CM

## 2023-07-27 DIAGNOSIS — I2510 Atherosclerotic heart disease of native coronary artery without angina pectoris: Secondary | ICD-10-CM | POA: Diagnosis not present

## 2023-07-27 DIAGNOSIS — E0590 Thyrotoxicosis, unspecified without thyrotoxic crisis or storm: Secondary | ICD-10-CM | POA: Diagnosis not present

## 2023-07-27 MED ORDER — BLOOD PRESSURE MONITOR DEVI: 1.0000 | Freq: Every day | 0 refills | Status: DC

## 2023-07-27 NOTE — Progress Notes (Signed)
Cardiology Office Note:  .   Date:  07/27/2023 ID:  Kirk Oconnor, DOB [birth year], MRN [id number] PCP: Kirk Records, FNP   HeartCare Providers Cardiologist:  Kirk Rich, MD    History of Present Illness: .   Kirk Oconnor is a 67 y.o. male with a PMH of mild non-obstructive CAD, chest pain, aortic aneurysm, aortic regurgitation, s/p bioprosthetic AVR and aortic root replacement with reimplantation of coronaries, HTN, hx of hep C, hyperthyroidism, OSA, T2DM, and DOE, who presents today for scheduled follow-up.   Previous patient of Dr. Purvis Sheffield. CV history includes 2018 cath at [facility] that revealed mild nonobstructive CAD.   Last seen by Dr. Dina Oconnor on April 18, 2022. Patient noted intermittent shortness of breath with exertion, gradually worsening over the past 3 years.  Noted infrequent chest pains not related to shortness of breath, described as only lasting a few seconds of stabbing pain, located on the right side.  Echocardiogram was updated and revealed normal EF, aortic valve was functioning normally.  Today he presents for scheduled follow-up.  He states he is being followed by Dr. Fransico Oconnor for his hyperthyroidism. Endorses unintentional weight loss, reports losing around 50-60 lbs unintentionally this year. Endorses feeling on edge and sweating due to his condition. Endorses concern regarding his medication regimen and current condition with his thyroid. Has some questions related to his thyroid. Denies any chest pain, shortness of breath, palpitations, syncope, presyncope, dizziness, orthopnea, PND, swelling or significant weight changes, acute bleeding, or claudication.   ROS: Negative. See HPI.   Studies Reviewed: .    Echo 03/2022: 1. Left ventricular ejection fraction, by estimation, is 60 to 65%. The  left ventricle has normal function. The left ventricle has no regional  wall motion abnormalities. Left ventricular diastolic parameters are   indeterminate. The average left  ventricular global longitudinal strain is -18.6 %. The global longitudinal  strain is normal.   2. Right ventricular systolic function is normal. The right ventricular  size is normal. Tricuspid regurgitation signal is inadequate for assessing  PA pressure.   3. The mitral valve is normal in structure. No evidence of mitral valve  regurgitation. No evidence of mitral stenosis.   4. The tricuspid valve is abnormal.   5. The aortic valve has been repaired/replaced. Aortic valve  regurgitation is trivial. No aortic stenosis is present.   6. History of 27 mm Medtronic freestyle porcine root with direct  reimplantation of the left and right coronary ostia, replacement of  ascending aorta with a 28 mm Gelweave Dacron graft. Aortic Aortic root  replacement and reimplantation of coronaries in  July 2018 at [facility].   7. The inferior vena cava is normal in size with greater than 50%  respiratory variability, suggesting right atrial pressure of 3 mmHg.   Comparison(s): Echocardiogram done 07/12/17 showed an Ef of 40%.  Physical Exam:   VS:  BP (!) 175/90 (BP Location: Left Arm, Patient Position: Sitting, Cuff Size: Normal)   Pulse 88   Ht 5\' 8"  (1.727 m)   Wt 161 lb 3.2 oz (73.1 kg)   SpO2 95%   BMI 24.51 kg/m    Wt Readings from Last 3 Encounters:  07/27/23 161 lb 3.2 oz (73.1 kg)  07/12/23 163 lb 1.9 oz (74 kg)  06/19/23 160 lb 3.2 oz (72.7 kg)    GEN: Well nourished, well developed in no acute distress NECK: No JVD; No carotid bruits CARDIAC: S1/S2, RRR, no murmurs, rubs, gallops  RESPIRATORY:  Clear to auscultation without rales, wheezing or rhonchi  ABDOMEN: Soft, non-tender, non-distended EXTREMITIES:  No edema; No deformity   ASSESSMENT AND PLAN: .    CAD Stable with no anginal symptoms. No indication for ischemic evaluation. Continue ASA and losartan. Heart healthy diet and regular cardiovascular exercise encouraged. Care and ED  precautions discussed.   Aortic aneurysm, aortic regurgitation, s/p bioprosthetic AVR and aortic root replacement with reimplantation of coronaries Denies any issues. TTE 04/2022 revealed normal functioning valve. Will continue to monitor.   HTN BP elevated. Newly diagnosed with hyperthyroidism, also admits to anxiety/worry. Discussed to monitor BP at home at least 2 hours after medications and sitting for 5-10 minutes. Will provide BP cuff for evaluation. Will bring Oconnor back in 1-2 weeks for a BP check. No medication changes at this time. Heart healthy diet and regular cardiovascular exercise encouraged.   Hyperthyroidism Admits to unintentional weight loss, sweating, and anxiety/worry related to his condition. Requesting a sooner appt with Endocrinology. No nodules on thyroid noted on exam. Will route note to Endocrinologist to make aware. Continue to follow-up with Endocrinology.   Dispo: Follow-up with Dr. Wyline Mood or APP in 6 months or sooner if anything changes.   Signed, [name], NP

## 2023-07-27 NOTE — Patient Instructions (Addendum)
Medication Instructions:  Your physician recommends that you continue on your current medications as directed. Please refer to the Current Medication list given to you today.  Labwork: None   Testing/Procedures: None   Follow-Up: Your physician recommends that you schedule a follow-up appointment in:  1-2 week nurse visit for BP check  6 Months   Any Other Special Instructions Will Be Listed Below (If Applicable).   If you need a refill on your cardiac medications before your next appointment, please call your pharmacy.

## 2023-07-30 ENCOUNTER — Encounter: Payer: Self-pay | Admitting: Nurse Practitioner

## 2023-08-08 ENCOUNTER — Other Ambulatory Visit: Payer: Self-pay | Admitting: *Deleted

## 2023-08-08 ENCOUNTER — Encounter: Payer: Self-pay | Admitting: Gastroenterology

## 2023-08-08 NOTE — Patient Outreach (Signed)
  Medicaid Managed Care   Unsuccessful Attempt Note   08/08/2023 Name: Kirk Oconnor MRN: [id number] DOB: 26-Apr-1956  Referred by: [name], FNP Reason for referral : High Risk Managed Medicaid (Unsuccessful RNCM follow up telephone outreach)   An unsuccessful telephone outreach was attempted today. The patient was referred to the case management team for assistance with care management and care coordination.    Follow Up Plan: The Managed Medicaid care management team will reach out to the patient again over the next 7 days.    [name] RN, BSN [place]  [facility] RN Care Coordinator [phone]

## 2023-08-08 NOTE — Patient Instructions (Signed)
Visit Information  Mr. Kirk Oconnor  - as a part of your Medicaid benefit, you are eligible for care management and care coordination services at no cost or copay. I was unable to reach you by phone today but would be happy to help you with your health related needs. Please feel free to call me @ [phone].   A member of the Managed Medicaid care management team will reach out to you again over the next 7 days.   [name] RN, BSN [place]  [facility] RN Care Coordinator [phone]

## 2023-08-10 ENCOUNTER — Telehealth: Payer: Self-pay | Admitting: "Endocrinology

## 2023-08-10 ENCOUNTER — Ambulatory Visit: Payer: 59 | Attending: Internal Medicine

## 2023-08-10 DIAGNOSIS — I10 Essential (primary) hypertension: Secondary | ICD-10-CM | POA: Diagnosis not present

## 2023-08-10 MED ORDER — LOSARTAN POTASSIUM 25 MG PO TABS: 25.0000 mg | ORAL_TABLET | Freq: Every day | ORAL | 1 refills | Status: DC

## 2023-08-10 NOTE — Telephone Encounter (Signed)
I called the patient, voicemail box is full and he cannot take any messages at this time.

## 2023-08-10 NOTE — Progress Notes (Signed)
Patient here for Nurse BP check per peck  Patient BP is 130/80 SPO2 97   HR 88

## 2023-08-10 NOTE — Patient Instructions (Signed)
Medication Instructions:  Your physician recommends that you continue on your current medications as directed. Please refer to the Current Medication list given to you today.  Labwork: None   Testing/Procedures: None   Follow-Up: Your physician recommends that you schedule a follow-up appointment in: as scheduled   Any Other Special Instructions Will Be Listed Below (If Applicable).  If you need a refill on your cardiac medications before your next appointment, please call your pharmacy.

## 2023-08-10 NOTE — Telephone Encounter (Signed)
Pt said he is getting dizzy a lot, urinating a lot and his toes are sore. He is asking what he should do?

## 2023-08-11 ENCOUNTER — Telehealth: Payer: Self-pay

## 2023-08-11 NOTE — Telephone Encounter (Signed)
..   Medicaid Managed Care   Unsuccessful Outreach Note  08/11/2023 Name: Kirk Oconnor MRN: [id number] DOB: 1956/03/30  Referred by: [name], FNP Reason for referral : Appointment   A second unsuccessful telephone outreach was attempted today. The patient was referred to the case management team for assistance with care management and care coordination.   Follow Up Plan: A HIPAA compliant phone message was left for the patient providing contact information and requesting a return call.  The care management team will reach out to the patient again over the next 7 days.   Weston Settle Care Guide  [facility] Managed  Care Guide [facility]  [phone]

## 2023-08-14 NOTE — Progress Notes (Signed)
BP is much better. No med changes at this time. Follow-up as scheduled.   [name], NP

## 2023-08-15 ENCOUNTER — Telehealth: Payer: Self-pay

## 2023-08-16 NOTE — Telephone Encounter (Signed)
Encounter made in error. 

## 2023-08-18 ENCOUNTER — Other Ambulatory Visit: Payer: Self-pay | Admitting: *Deleted

## 2023-08-18 NOTE — Patient Instructions (Signed)
Visit Information  Mr. Gorsky was given information about Medicaid Managed Care team care coordination services as a part of their [facility] Community Plan Medicaid benefit. [name] verbally consented to engagement with the [facility] Managed Care team.   If you are experiencing a medical emergency, please call 911 or report to your local emergency department or urgent care.   If you have a non-emergency medical problem during routine business hours, please contact your provider's office and ask to speak with a nurse.   For questions related to your [facility], please call: [phone] or visit the homepage here: [email or web address]  If you would like to schedule transportation through your [facility], please call the following number at least 2 days in advance of your appointment: [phone]   Rides for urgent appointments can also be made after hours by calling Member Services.  Call the Behavioral Health Crisis Line at [phone], at any time, 24 hours a day, 7 days a week. If you are in danger or need immediate medical attention call 911.  If you would like help to quit smoking, call 1-800-QUIT-NOW ((903)668-9671) OR Espaol: 1-855-Djelo-Ya (1-324-401-0272) o para ms informacin haga clic aqu or Text READY to 536-644 to register via text  [name],   Please see education materials related to thyroid provided by MyChart link.  Patient verbalizes understanding of instructions and care plan provided today and agrees to view in MyChart. Active MyChart status and patient understanding of how to access instructions and care plan via MyChart confirmed with patient.     Telephone follow up appointment with Managed Medicaid care management team member scheduled for:09/21/23 at 9:15am  [name] RN, BSN [place]  Value-Based Care [facility]  Health RN Care Coordinator [phone]   Following is a copy of your plan of care:  Care Plan : RN Care Manager Plan of Care  Updates made by Heidi Dach, RN since 08/18/2023 12:00 AM     Problem: Health Management needs related to Chronic Pain      Long-Range Goal: Development of Plan of Care to address Health Management needs related to Thyroid Disorder   Start Date: 03/21/2023  Expected End Date: 10/27/2023  Note:   Current Barriers:  Chronic Disease Management support and education needs related to HTN and Thyroid Disorder  Patient is very concerned about his weight loss. Would like to weigh #180. Needs assistance with affording rent.  RNCM Clinical Goal(s):  Patient will verbalize understanding of plan for management of Chronic Pain as evidenced by patient reports take all medications exactly as prescribed and will call provider for medication related questions as evidenced by patient reports    attend all scheduled medical appointments:   08/21/23 with Urology, 08/28/23 with PCP, 08/31/23 with Urology, 09/12/23 with [facility] and 10/04/22 with Endocrinology as evidenced by provider documentation in EMR        continue to work with RN Care Manager and/or Social Worker to address care management and care coordination needs related to chronic pain as evidenced by adherence to CM Team Scheduled appointments        Interventions: Inter-disciplinary care team collaboration (see longitudinal plan of care) Evaluation of current treatment plan related to  self management and patient's adherence to plan as established by provider   Thyroid Disorder  (Status:  New goal.)  Long Term Goal Evaluation of current treatment plan related to  Thyroid Disorder ,  self-management and patient's adherence to  plan as established by provider. Discussed plans with patient for ongoing care management follow up and provided patient with direct contact information for care management team Reviewed  medications with patient and discussed in detail which medications are to be taken everyday and which medications to take as needed Reviewed scheduled/upcoming provider appointments including see list above Social Work referral for assistance with rent-scheduled on 08/22/23 Assessed social determinant of health barriers Reviewed patient's weight and discussed that he has gained #8 since June 2024 Encouraged patient to think positive thoughts, eat a healthy diet, exercise and get outside everyday   Hypertension Interventions:  (Status:  Goal on track:  Yes.) Long Term Goal Last practice recorded BP readings:  BP Readings from Last 3 Encounters:  08/10/23 130/80  07/27/23 (!) 175/90  07/12/23 133/87   Most recent eGFR/CrCl: No results found for: "EGFR"  No components found for: "CRCL"  Evaluation of current treatment plan related to hypertension self management and patient's adherence to plan as established by provider Reviewed medications with patient and discussed importance of compliance Discussed plans with patient for ongoing care management follow up and provided patient with direct contact information for care management team Reviewed scheduled/upcoming provider appointments including: see list above Advised patient to exercise 30 minutes daily  Advised patient to focus on what he can do and not what he can't do Advised patient to keep a journal and discuss any concerns or questions with Provider Advised patient to check BP and record, take record to next PCP visit   Patient Goals/Self-Care Activities: Take medications as prescribed   Attend all scheduled provider appointments Call provider office for new concerns or questions  Work with the social worker to address care coordination needs and will continue to work with the clinical team to address health care and disease management related needs

## 2023-08-18 NOTE — Patient Outreach (Signed)
Medicaid Managed Care   Nurse Care Manager Note  08/18/2023 Name:  Kirk Oconnor MRN:  [id number] DOB:  22-Jan-1956  Kirk Oconnor is an 67 y.o. year old male who is a primary patient of Kirk Nigel Berthold, FNP.  The [facility] Managed Care Coordination team was consulted for assistance with:    HTN Thyroid Disorder  [name] was given information about Medicaid Managed Care Coordination team services today. Kirk Oconnor Patient agreed to services and verbal consent obtained.  Engaged with patient by telephone for follow up visit in response to provider referral for case management and/or care coordination services.   Assessments/Interventions:  Review of past medical history, allergies, medications, health status, including review of consultants reports, laboratory and other test data, was performed as part of comprehensive evaluation and provision of chronic care management services.  SDOH (Social Determinants of Health) assessments and interventions performed: SDOH Interventions    Flowsheet Row Patient Outreach Telephone from 08/18/2023 in [place] POPULATION HEALTH DEPARTMENT Office Visit from 07/12/2023 in [facility] Patient Outreach Telephone from 03/21/2023 in [place] POPULATION HEALTH DEPARTMENT Office Visit from 03/01/2023 in [place] [facility] [place] Office Visit from 01/20/2023 in [facility]  SDOH Interventions       Food Insecurity Interventions Intervention Not Indicated -- Intervention Not Indicated -- --  Housing Interventions Other (Comment)  [BSW referral] -- Other (Comment)  [Patient is scheduled with BSW on 03/23/23] AMB Referral --  Transportation Interventions Intervention Not Indicated -- Intervention Not Indicated -- --  Utilities Interventions Intervention Not Indicated -- Intervention Not Indicated -- --  Depression Interventions/Treatment  -- Medication, Counseling -- -- Counseling       Care  Plan  No Known Allergies  Medications Reviewed Today     Reviewed by Kirk Dach, RN (Registered Nurse) on 08/18/23 at 1055  Med List Status: <None>   Medication Order Taking? Sig Documenting Provider Last Dose Status Informant  acetaminophen (TYLENOL) 500 MG tablet [id number] Yes Take 500 mg by mouth every 6 (six) hours as needed. [provider] Taking Active   AMBULATORY NON FORMULARY MEDICATION [id number] Yes Vacuum erection device. Dispense 1. Kirk Falls, FNP Taking Active   amLODipine (NORVASC) 5 MG tablet [id number] Yes Take 1 tablet (5 mg total) by mouth daily. Kirk Oconnor, Tenna Child, FNP Taking Active   aspirin EC 81 MG tablet [id number] Yes Take 81 mg by mouth daily. [provider] Taking Active Family Member  Blood Pressure Monitor DEVI [id number] Yes 1 each by Does not apply route daily. Kirk Dory, NP Taking Active   gabapentin (NEURONTIN) 300 MG capsule [id number] Yes Take 1 capsule (300 mg total) by mouth every 8 (eight) hours as needed. Kirk Nigel Berthold, FNP Taking Active   Levothyroxine Sodium 125 MCG CAPS [id number] No Use as directed 125 mcg in the mouth or throat daily.  Patient not taking: Reported on 08/18/2023   [provider] Not Taking Active   losartan (COZAAR) 25 MG tablet [id number] Yes Take 1 tablet (25 mg total) by mouth daily. Kirk Dory, NP Taking Active   meclizine (ANTIVERT) 25 MG tablet [id number] Yes Take 1 tablet (25 mg total) by mouth 2 (two) times daily as needed for dizziness. Kirk Halon, MD Taking Active   metFORMIN (GLUCOPHAGE) 500 MG tablet [id number] Yes Take 1 tablet (500 mg total) by mouth daily with breakfast. Kirk Nigel Berthold, FNP Taking Active   methimazole (  TAPAZOLE) 5 MG tablet [id number] Yes Take 1 tablet (5 mg total) by mouth daily with breakfast. Kirk Kayser, MD Taking Active   omeprazole (PRILOSEC) 40 MG capsule [id number] Yes Take 1 capsule (40 mg total) by mouth 2 (two)  times daily. Kirk Bal, DO Taking Active            Med Note Kirk Oconnor, Kirk Oconnor   Mon Mar 27, 2023  3:32 PM)    sildenafil (VIAGRA) 100 MG tablet [id number] Yes Take 1 tablet (100 mg total) by mouth daily as needed for erectile dysfunction. Kirk Falls, FNP Taking Active   tiZANidine (ZANAFLEX) 4 MG tablet [id number] Yes Take 1 tablet (4 mg total) by mouth every 8 (eight) hours as needed for muscle spasms. Kirk Nigel Berthold, FNP Taking Active             Patient Active Problem List   Diagnosis Date Noted   Depression, major, single episode, moderate (HCC) 07/12/2023   Hypersomnolence 06/19/2023   Dizziness 06/06/2023   Hematuria 06/06/2023   Left anterior fascicular block (LAFB) 06/06/2023   Lumbar pain 04/25/2023   Hypertension 04/25/2023   Current smoker 04/05/2023   Hyponatremia 03/27/2023   Hyperthyroidism 03/27/2023   Erectile dysfunction 02/28/2023   Type 2 diabetes mellitus (HCC) 01/20/2023   Nocturia 01/18/2023   Unexplained weight loss 01/18/2023   Hepatic cirrhosis (HCC) 07/20/2022   Hyperglycemia 07/11/2017   Thrombocytopenia (HCC) 07/11/2017   Chronic back pain 06/26/2017   COPD (chronic obstructive pulmonary disease) (HCC) 04/14/2017   CVA (cerebral vascular accident) (HCC) 04/14/2017   History of alcohol abuse 04/14/2017   History of cocaine abuse (HCC) 04/14/2017   OSA (obstructive sleep apnea) 04/14/2017   BPH (benign prostatic hyperplasia) 10/22/2016   GERD (gastroesophageal reflux disease) 10/22/2016   Aortic root dilation (HCC) 10/21/2016   Acute diastolic CHF (congestive heart failure) (HCC) 10/21/2016   Essential hypertension, benign 03/27/2013   Hepatitis C 03/27/2013   Snoring 03/27/2013    Conditions to be addressed/monitored per PCP order:  HTN and Thyroid Disorder  Care Plan : RN Care Manager Plan of Care  Updates made by Kirk Dach, RN since 08/18/2023 12:00 AM     Problem: Health Management needs related to Chronic  Pain      Long-Range Goal: Development of Plan of Care to address Health Management needs related to Thyroid Disorder   Start Date: 03/21/2023  Expected End Date: 10/27/2023  Note:   Current Barriers:  Chronic Disease Management support and education needs related to HTN and Thyroid Disorder  Patient is very concerned about his weight loss. Would like to weigh #180. Needs assistance with affording rent.  RNCM Clinical Goal(s):  Patient will verbalize understanding of plan for management of Chronic Pain as evidenced by patient reports take all medications exactly as prescribed and will call provider for medication related questions as evidenced by patient reports    attend all scheduled medical appointments:   08/21/23 with Urology, 08/28/23 with PCP, 08/31/23 with Urology, 09/12/23 with [facility] and 10/04/22 with Endocrinology as evidenced by provider documentation in EMR        continue to work with RN Care Manager and/or Social Worker to address care management and care coordination needs related to chronic pain as evidenced by adherence to CM Team Scheduled appointments        Interventions: Inter-disciplinary care team collaboration (see longitudinal plan of care) Evaluation of current treatment plan related to  self management and patient's adherence  to plan as established by provider   Thyroid Disorder  (Status:  New goal.)  Long Term Goal Evaluation of current treatment plan related to  Thyroid Disorder ,  self-management and patient's adherence to plan as established by provider. Discussed plans with patient for ongoing care management follow up and provided patient with direct contact information for care management team Reviewed medications with patient and discussed in detail which medications are to be taken everyday and which medications to take as needed Reviewed scheduled/upcoming provider appointments including see list above Social Work referral for assistance with rent-scheduled  on 08/22/23 Assessed social determinant of health barriers Reviewed patient's weight and discussed that he has gained #8 since June 2024 Encouraged patient to think positive thoughts, eat a healthy diet, exercise and get outside everyday   Hypertension Interventions:  (Status:  Goal on track:  Yes.) Long Term Goal Last practice recorded BP readings:  BP Readings from Last 3 Encounters:  08/10/23 130/80  07/27/23 (!) 175/90  07/12/23 133/87   Most recent eGFR/CrCl: No results found for: "EGFR"  No components found for: "CRCL"  Evaluation of current treatment plan related to hypertension self management and patient's adherence to plan as established by provider Reviewed medications with patient and discussed importance of compliance Discussed plans with patient for ongoing care management follow up and provided patient with direct contact information for care management team Reviewed scheduled/upcoming provider appointments including: see list above Advised patient to exercise 30 minutes daily  Advised patient to focus on what he can do and not what he can't do Advised patient to keep a journal and discuss any concerns or questions with Provider Advised patient to check BP and record, take record to next PCP visit   Patient Goals/Self-Care Activities: Take medications as prescribed   Attend all scheduled provider appointments Call provider office for new concerns or questions  Work with the social worker to address care coordination needs and will continue to work with the clinical team to address health care and disease management related needs       Follow Up:  Patient agrees to Care Plan and Follow-up.  Plan: The Managed Medicaid care management team will reach out to the patient again over the next 30 days.  Date/time of next scheduled RN care management/care coordination outreach:  09/21/23 at 9:15am  [name] RN, BSN [place]  Value-Based Care  [facility] Health RN Care Coordinator [phone]

## 2023-08-21 ENCOUNTER — Ambulatory Visit: Payer: 59 | Admitting: Urology

## 2023-08-22 ENCOUNTER — Other Ambulatory Visit: Payer: Self-pay

## 2023-08-22 NOTE — Patient Instructions (Signed)
Visit Information  The Patient                                              was given information about Medicaid Managed Care team care coordination services and consented to engagement with the [facility] Managed Care team.   Social Worker will follow up in 30 days.   Abelino Derrick, MHA [facility] Health  Managed [facility] Social Worker [phone]

## 2023-08-22 NOTE — Patient Outreach (Signed)
Medicaid Managed Care Social Work Note  08/22/2023 Name:  Kirk Oconnor MRN:  [id number] DOB:  03/30/1956  Kirk Oconnor is an 67 y.o. year old male who is a primary patient of Del Nigel Berthold, FNP.  The [facility] Managed Care Coordination team was consulted for assistance with:  Community Resources   Kirk Oconnor was given information about Medicaid Managed Care Coordination team services today. Kirk Oconnor Patient agreed to services and verbal consent obtained.  Engaged with patient  for by telephone forfollow up visit in response to referral for case management and/or care coordination services.   Assessments/Interventions:  Review of past medical history, allergies, medications, health status, including review of consultants reports, laboratory and other test data, was performed as part of comprehensive evaluation and provision of chronic care management services.  SDOH: (Social Determinant of Health) assessments and interventions performed: SDOH Interventions    Flowsheet Row Patient Outreach Telephone from 08/18/2023 in [place] POPULATION HEALTH DEPARTMENT Office Visit from 07/12/2023 in [facility] Patient Outreach Telephone from 03/21/2023 in [place] POPULATION HEALTH DEPARTMENT Office Visit from 03/01/2023 in [facility] [place] Office Visit from 01/20/2023 in [facility]  SDOH Interventions       Food Insecurity Interventions Intervention Not Indicated -- Intervention Not Indicated -- --  Housing Interventions Other (Comment)  [BSW referral] -- Other (Comment)  [Patient is scheduled with BSW on 03/23/23] AMB Referral --  Transportation Interventions Intervention Not Indicated -- Intervention Not Indicated -- --  Utilities Interventions Intervention Not Indicated -- Intervention Not Indicated -- --  Depression Interventions/Treatment  -- Medication, Counseling -- -- Counseling      BSW completed a telephone  outreach with patient, he states he is in need of resources for rent. He gets 490 in social security each month and rent is 550. Patient states he also receives foodstamps. BSW and patient agreed for resources for food and rent to be mailed to address on file Advanced Directives Status:  Not addressed in this encounter.  Care Plan                 No Known Allergies  Medications Reviewed Today   Medications were not reviewed in this encounter     Patient Active Problem List   Diagnosis Date Noted   Depression, major, single episode, moderate (HCC) 07/12/2023   Hypersomnolence 06/19/2023   Dizziness 06/06/2023   Hematuria 06/06/2023   Left anterior fascicular block (LAFB) 06/06/2023   Lumbar pain 04/25/2023   Hypertension 04/25/2023   Current smoker 04/05/2023   Hyponatremia 03/27/2023   Hyperthyroidism 03/27/2023   Erectile dysfunction 02/28/2023   Type 2 diabetes mellitus (HCC) 01/20/2023   Nocturia 01/18/2023   Unexplained weight loss 01/18/2023   Hepatic cirrhosis (HCC) 07/20/2022   Hyperglycemia 07/11/2017   Thrombocytopenia (HCC) 07/11/2017   Chronic back pain 06/26/2017   COPD (chronic obstructive pulmonary disease) (HCC) 04/14/2017   CVA (cerebral vascular accident) (HCC) 04/14/2017   History of alcohol abuse 04/14/2017   History of cocaine abuse (HCC) 04/14/2017   OSA (obstructive sleep apnea) 04/14/2017   BPH (benign prostatic hyperplasia) 10/22/2016   GERD (gastroesophageal reflux disease) 10/22/2016   Aortic root dilation (HCC) 10/21/2016   Acute diastolic CHF (congestive heart failure) (HCC) 10/21/2016   Essential hypertension, benign 03/27/2013   Hepatitis C 03/27/2013   Snoring 03/27/2013    Conditions to be addressed/monitored per PCP order:   community resources  Care Plan : RN  Care Manager Plan of Care  Updates made by Shaune Leeks since 08/22/2023 12:00 AM     Problem: Health Management needs related to Chronic Pain      Long-Range Goal:  Development of Plan of Care to address Health Management needs related to Thyroid Disorder   Start Date: 03/21/2023  Expected End Date: 10/27/2023  Note:   Current Barriers:  Chronic Disease Management support and education needs related to HTN and Thyroid Disorder  Patient is very concerned about his weight loss. Would like to weigh #180. Needs assistance with affording rent.  RNCM Clinical Goal(s):  Patient will verbalize understanding of plan for management of Chronic Pain as evidenced by patient reports take all medications exactly as prescribed and will call provider for medication related questions as evidenced by patient reports    attend all scheduled medical appointments:   08/21/23 with Urology, 08/28/23 with PCP, 08/31/23 with Urology, 09/12/23 with [facility] and 10/04/22 with Endocrinology as evidenced by provider documentation in EMR        continue to work with RN Care Manager and/or Social Worker to address care management and care coordination needs related to chronic pain as evidenced by adherence to CM Team Scheduled appointments        Interventions: Inter-disciplinary care team collaboration (see longitudinal plan of care) Evaluation of current treatment plan related to  self management and patient's adherence to plan as established by provider BSW completed a telephone outreach with patient, he states he is in need of resources for rent. He gets 490 in social security each month and rent is 550. Patient states he also receives foodstamps. BSW and patient agreed for resources for food and rent to be mailed to address on file.   Thyroid Disorder  (Status:  New goal.)  Long Term Goal Evaluation of current treatment plan related to  Thyroid Disorder ,  self-management and patient's adherence to plan as established by provider. Discussed plans with patient for ongoing care management follow up and provided patient with direct contact information for care management team Reviewed  medications with patient and discussed in detail which medications are to be taken everyday and which medications to take as needed Reviewed scheduled/upcoming provider appointments including see list above Social Work referral for assistance with rent-scheduled on 08/22/23 Assessed social determinant of health barriers Reviewed patient's weight and discussed that he has gained #8 since June 2024 Encouraged patient to think positive thoughts, eat a healthy diet, exercise and get outside everyday   Hypertension Interventions:  (Status:  Goal on track:  Yes.) Long Term Goal Last practice recorded BP readings:  BP Readings from Last 3 Encounters:  08/10/23 130/80  07/27/23 (!) 175/90  07/12/23 133/87   Most recent eGFR/CrCl: No results found for: "EGFR"  No components found for: "CRCL"  Evaluation of current treatment plan related to hypertension self management and patient's adherence to plan as established by provider Reviewed medications with patient and discussed importance of compliance Discussed plans with patient for ongoing care management follow up and provided patient with direct contact information for care management team Reviewed scheduled/upcoming provider appointments including: see list above Advised patient to exercise 30 minutes daily  Advised patient to focus on what he can do and not what he can't do Advised patient to keep a journal and discuss any concerns or questions with Provider Advised patient to check BP and record, take record to next PCP visit   Patient Goals/Self-Care Activities: Take medications as prescribed  Attend all scheduled provider appointments Call provider office for new concerns or questions  Work with the social worker to address care coordination needs and will continue to work with the clinical team to address health care and disease management related needs       Follow up:  Patient agrees to Care Plan and Follow-up.  Plan: The  Managed Medicaid care management team will reach out to the patient again over the next 30 days.  Date/time of next scheduled Social Work care management/care coordination outreach:  09/22/23 [name], [name], [facility] Health  Managed [facility] Social Worker [phone]

## 2023-08-28 ENCOUNTER — Ambulatory Visit: Payer: Medicaid Other | Admitting: Family Medicine

## 2023-08-30 NOTE — Progress Notes (Unsigned)
Name: Kirk Oconnor DOB: 03-02-1956 MRN: [id number]  History of Present Illness: Mr. Kirk Oconnor is a 67 y.o. male who presents today for follow up visit at [facility]. - GU history: 1. BPH with LUTS (nocturia). - Previously took Flomax. - PSA values have been normal (1.4 on 01/20/2023 and 1.0 on 04/26/2023).  2. Erectile dysfunction. - Had inadequate response to Sildenafil or Tadalafil. - Intracorporeal injections with Alprostadil were cost prohibitive.  At last visit on 06/19/2023: 1. BPH: No voiding complaints. 2. ED:  - Viagra 100 mg PRN ineffective for ED; he declined refills. - Testosterone was normal (706). - Printed order given to patient for VED in hopes that may be covered under DME. - Offered new prescription for intracorporeal injections; he declined due to cost.  - He requested follow up with Dr. Ronne Binning for 2nd opinion for ED. Advised to discuss his erectile dysfunction at upcoming endocrinology appointment also.   Since last visit: Was scheduled to see Dr. Ronne Binning on 08/21/2023; patient rescheduled that to 10/27/2023.  Today: He reports malodorous urine for the past several months. He denies increased urinary urgency, frequency, nocturia, dysuria, gross hematuria, hesitancy, straining to void, or sensations of incomplete emptying.  Reports concern about skin discoloration on his head, neck, and chest over the past few days. Denies itching or redness.  Also reports upset stomach when he consumes sweets. Reports history of GERD.   Fall Screening: Do you usually have a device to assist in your mobility? No   Medications: Current Outpatient Medications  Medication Sig Dispense Refill   fluconazole (DIFLUCAN) 150 MG tablet Take two tablets by mouth on day 1. Repeat dose with 2 tablets by mouth on day 8 (1 week after 1st dose). 4 tablet 0   acetaminophen (TYLENOL) 500 MG tablet Take 500 mg by mouth every 6 (six) hours as needed.     AMBULATORY NON  FORMULARY MEDICATION Vacuum erection device. Dispense 1. 1 each 0   amLODipine (NORVASC) 5 MG tablet Take 1 tablet (5 mg total) by mouth daily. 30 tablet 3   aspirin EC 81 MG tablet Take 81 mg by mouth daily.     Blood Pressure Monitor DEVI 1 each by Does not apply route daily. 1 each 0   gabapentin (NEURONTIN) 300 MG capsule Take 1 capsule (300 mg total) by mouth every 8 (eight) hours as needed. 90 capsule 3   Levothyroxine Sodium 125 MCG CAPS Use as directed 125 mcg in the mouth or throat daily. (Patient not taking: Reported on 08/18/2023)     losartan (COZAAR) 25 MG tablet Take 1 tablet (25 mg total) by mouth daily. 90 tablet 1   meclizine (ANTIVERT) 25 MG tablet Take 1 tablet (25 mg total) by mouth 2 (two) times daily as needed for dizziness. 30 tablet 0   metFORMIN (GLUCOPHAGE) 500 MG tablet Take 1 tablet (500 mg total) by mouth daily with breakfast. 90 tablet 1   methimazole (TAPAZOLE) 5 MG tablet Take 1 tablet (5 mg total) by mouth daily with breakfast. 90 tablet 1   omeprazole (PRILOSEC) 40 MG capsule Take 1 capsule (40 mg total) by mouth 2 (two) times daily. 60 capsule 11   sildenafil (VIAGRA) 100 MG tablet Take 1 tablet (100 mg total) by mouth daily as needed for erectile dysfunction. 30 tablet 1   tiZANidine (ZANAFLEX) 4 MG tablet Take 1 tablet (4 mg total) by mouth every 8 (eight) hours as needed for muscle spasms. 30 tablet 1  No current facility-administered medications for this visit.    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   Arthritis    CAD (coronary artery disease)    Nonobstructive at cardiac catheterization July 2018 - [facility]   Carpal tunnel syndrome    Depression    Diabetes  mellitus without complication (HCC)    Essential hypertension    GERD (gastroesophageal reflux disease)    Hepatitis C    Sleep apnea    Past Surgical History:  Procedure Laterality Date   AORTIC VALVE REPLACEMENT  03/2017   [facility]   BIOPSY  12/15/2022   Procedure: BIOPSY;  Surgeon: Lanelle Bal, DO;  [facility]: [facility];  Service: Endoscopy;;   COLONOSCOPY WITH PROPOFOL N/A 12/15/2022   Procedure: COLONOSCOPY WITH PROPOFOL;  Surgeon: Lanelle Bal, DO;  [facility];  Service: Endoscopy;  Laterality: N/A;  10:00 am, asa 3   ESOPHAGOGASTRODUODENOSCOPY (EGD) WITH PROPOFOL N/A 12/15/2022   Procedure: ESOPHAGOGASTRODUODENOSCOPY (EGD) WITH PROPOFOL;  Surgeon: Lanelle Bal, DO;  [facility];  Service: Endoscopy;  Laterality: N/A;   POLYPECTOMY  12/15/2022   Procedure: POLYPECTOMY;  Surgeon: Lanelle Bal, DO;  [facility]: [facility];  Service: Endoscopy;;   Family History  Problem Relation Age of Onset   Diabetes Mother    Heart attack Mother    Heart attack Father    Social History   Socioeconomic History   Marital status: Divorced    Spouse name: Not on file   Number of children: Not on file   Years of education: Not on file   Highest education level: Not on file  Occupational History   Not on file  Tobacco Use   Smoking status: Some Days    Types: Cigarettes   Smokeless tobacco: Never  Vaping Use   Vaping status: Never Used  Substance and Sexual Activity   Alcohol use: Yes    Comment: socially   Drug use: No   Sexual activity: Yes  Other Topics Concern   Not on file  Social History Narrative   Not on file   Social Determinants of Health   Financial Resource Strain: Low Risk  (08/22/2023)   Overall Financial Resource Strain (CARDIA)    Difficulty of Paying Living Expenses: Not very hard  Food Insecurity: No Food Insecurity (08/18/2023)   Hunger Vital Sign    Worried About Running Out of Food in the Last Year: Never  true    Ran Out of Food in the Last Year: Never true  Transportation Needs: No Transportation Needs (08/18/2023)   PRAPARE - Administrator, Civil Service (Medical): No    Lack of Transportation (Non-Medical): No  Physical Activity: Not on file  Stress: Not on file  Social Connections: Not on file  Intimate Partner Violence: Not At Risk (08/18/2023)   Humiliation, Afraid, Rape, and Kick questionnaire    Fear of Current or Ex-Partner: No    Emotionally Abused: No    Physically Abused: No    Sexually Abused: No    Review of Systems Constitutional: Patient denies any unintentional weight loss or change in strength lntegumentary: Patient denies any rashes or pruritus  Cardiovascular: Patient denies chest pain or syncope Respiratory: Patient denies shortness of breath Gastrointestinal: Patient denies nausea, vomiting, constipation, or diarrhea Musculoskeletal: Patient denies muscle cramps or weakness Neurologic: Patient denies convulsions or seizures Allergic/Immunologic: Patient denies recent allergic reaction(s) Hematologic/Lymphatic: Patient denies bleeding tendencies Endocrine: Patient denies heat/cold intolerance  GU: As per HPI.  OBJECTIVE Vitals:   08/31/23 1116  BP: (!) 142/96  Pulse: 76  Temp: (!) 97.3 F (36.3 C)   Body mass index is 24.18 kg/m.  Physical Examination Constitutional: No obvious distress; patient is non-toxic appearing  Cardiovascular: No visible lower extremity edema.  Respiratory: The patient does not have audible wheezing/stridor; respirations do not appear labored  Gastrointestinal: Abdomen non-distended Musculoskeletal: Normal ROM of UEs  Skin: No obvious rashes/open sores  Neurologic: CN 2-12 grossly intact Psychiatric: Answered questions appropriately with normal affect  Hematologic/Lymphatic/Immunologic: No obvious bruises or sites of spontaneous bleeding  UA: unremarkable aside from bilirubinuria PVR: 33  ml  ASSESSMENT Abnormal urine odor - Plan: Comprehensive metabolic panel  Bilirubin in urine - Plan: Comprehensive metabolic panel  Benign prostatic hyperplasia with lower urinary tract symptoms, symptom details unspecified - Plan: Urinalysis, Routine w reflex microscopic, BLADDER SCAN AMB NON-IMAGING  Erectile dysfunction, unspecified erectile dysfunction type - Plan: Urinalysis, Routine w reflex microscopic, BLADDER SCAN AMB NON-IMAGING  Tinea versicolor - Plan: fluconazole (DIFLUCAN) 150 MG tablet  Gastroesophageal reflux disease, unspecified whether esophagitis present  We discussed finding of tinea versicolor. Discussed management options per UpToDate including topical cream versus oral medication. Agreed to treat with oral Diflucan 300 mg x2 doses 1 week apart. He was instructed to follow up with his PCP if this problem persists and for GERD management.   For his ED he still plans to follow up with Dr. Ronne Binning next month.   He was advised there is no evidence of UTI based on his UA, however his UA was positive for bilirubinuria, which may be the cause for his malorodorus urine and is likely related to his liver function. He was reassured that his liver labs (AST, ALT, alkaline phosphatase) were normal per CMP on 06/02/2023, however based on his level of concern we agreed to recheck CMP today. He was advised that if liver labs are abnormal then he will need to follow up with PCP and/or GI.  Pt verbalized understanding and agreement. All questions were answered.  PLAN Advised the following: 1. CMP. 2. Diflucan 300 mg x2 doses 1 week apart.  3. Return in 8 weeks (on 10/27/2023) for f/u with Dr. Ronne Binning, as previously scheduled.  Orders Placed This Encounter  Procedures   Urinalysis, Routine w reflex microscopic   Comprehensive metabolic panel   BLADDER SCAN AMB NON-IMAGING   It has been explained that the patient is to follow regularly with their PCP in addition to all other  providers involved in their care and to follow instructions provided by these respective offices. Patient advised to contact urology clinic if any urologic-pertaining questions, concerns, new symptoms or problems arise in the interim period.  There are no Patient Instructions on file for this visit.  Electronically signed by:  Donnita Falls, FNP   08/31/23    1:04 PM

## 2023-08-31 ENCOUNTER — Encounter: Payer: Self-pay | Admitting: Urology

## 2023-08-31 ENCOUNTER — Ambulatory Visit (INDEPENDENT_AMBULATORY_CARE_PROVIDER_SITE_OTHER): Payer: 59 | Admitting: Urology

## 2023-08-31 VITALS — BP 142/96 | HR 76 | Temp 97.3°F | Wt 159.0 lb

## 2023-08-31 DIAGNOSIS — K219 Gastro-esophageal reflux disease without esophagitis: Secondary | ICD-10-CM

## 2023-08-31 DIAGNOSIS — N529 Male erectile dysfunction, unspecified: Secondary | ICD-10-CM

## 2023-08-31 DIAGNOSIS — R8290 Unspecified abnormal findings in urine: Secondary | ICD-10-CM | POA: Diagnosis not present

## 2023-08-31 DIAGNOSIS — N401 Enlarged prostate with lower urinary tract symptoms: Secondary | ICD-10-CM | POA: Diagnosis not present

## 2023-08-31 DIAGNOSIS — R822 Biliuria: Secondary | ICD-10-CM

## 2023-08-31 DIAGNOSIS — B360 Pityriasis versicolor: Secondary | ICD-10-CM

## 2023-08-31 LAB — BLADDER SCAN AMB NON-IMAGING: Scan Result: 33

## 2023-08-31 MED ORDER — FLUCONAZOLE 150 MG PO TABS
ORAL_TABLET | ORAL | 0 refills | Status: DC
Start: 2023-08-31 — End: 2024-01-02

## 2023-09-01 ENCOUNTER — Telehealth: Payer: Self-pay

## 2023-09-01 LAB — COMPREHENSIVE METABOLIC PANEL
ALT: 26 [IU]/L (ref 0–44)
AST: 21 [IU]/L (ref 0–40)
Albumin: 4.2 g/dL (ref 3.9–4.9)
Alkaline Phosphatase: 88 [IU]/L (ref 44–121)
BUN/Creatinine Ratio: 18 (ref 10–24)
BUN: 17 mg/dL (ref 8–27)
Bilirubin Total: 0.5 mg/dL (ref 0.0–1.2)
CO2: 22 mmol/L (ref 20–29)
Calcium: 9.5 mg/dL (ref 8.6–10.2)
Chloride: 98 mmol/L (ref 96–106)
Creatinine, Ser: 0.93 mg/dL (ref 0.76–1.27)
Globulin, Total: 2.6 g/dL (ref 1.5–4.5)
Glucose: 90 mg/dL (ref 70–99)
Potassium: 4.7 mmol/L (ref 3.5–5.2)
Sodium: 133 mmol/L — ABNORMAL LOW (ref 134–144)
Total Protein: 6.8 g/dL (ref 6.0–8.5)
eGFR: 91 mL/min/{1.73_m2} (ref >59)

## 2023-09-01 LAB — URINALYSIS, ROUTINE W REFLEX MICROSCOPIC
Bilirubin, UA: NEGATIVE
Glucose, UA: NEGATIVE
Ketones, UA: NEGATIVE
Leukocytes,UA: NEGATIVE
Nitrite, UA: NEGATIVE
Protein,UA: NEGATIVE
RBC, UA: NEGATIVE
Specific Gravity, UA: 1.015 (ref 1.005–1.030)
Urobilinogen, Ur: 4 mg/dL — ABNORMAL HIGH (ref 0.2–1.0)
pH, UA: 6.5 (ref 5.0–7.5)

## 2023-09-01 NOTE — Telephone Encounter (Signed)
Patient was made aware of Maralyn Sago response to labs. Patient states he was put on Fluconazole for Tinea versicolor. Patient states that he only took one pill on yesterday. Patient is aware a message will be sent to Sarah regarding the proper dosage for patient since patient did not take medication as prescribe. Patient voiced understanding

## 2023-09-01 NOTE — Telephone Encounter (Signed)
-----   Message from Donnita Falls sent at 09/01/2023  8:23 AM EST ----- Please let pt know his renal and liver labs were normal.

## 2023-09-01 NOTE — Telephone Encounter (Signed)
Patient is made aware of Maralyn Sago response's "It's on his bottle. Take 2 pills on day 1 then the other 2 pills on day 8 (one week later). F/u with PCP if the problem persists." Patient voiced understanding.

## 2023-09-21 ENCOUNTER — Ambulatory Visit: Payer: 59 | Admitting: *Deleted

## 2023-09-22 ENCOUNTER — Other Ambulatory Visit: Payer: Self-pay | Admitting: "Endocrinology

## 2023-09-22 ENCOUNTER — Other Ambulatory Visit: Payer: Self-pay | Admitting: Urology

## 2023-09-22 ENCOUNTER — Other Ambulatory Visit: Payer: Self-pay | Admitting: *Deleted

## 2023-09-22 DIAGNOSIS — N529 Male erectile dysfunction, unspecified: Secondary | ICD-10-CM

## 2023-09-22 NOTE — Patient Instructions (Signed)
Visit Information  Kirk Oconnor was given information about Medicaid Managed Care team care coordination services as a part of their [facility] Plan Medicaid benefit. Kirk Oconnor verbally consented to engagement with the [facility] Managed Care team.   If you are experiencing a medical emergency, please call 911 or report to your local emergency department or urgent care.   If you have a non-emergency medical problem during routine business hours, please contact your provider's office and ask to speak with a nurse.   For questions related to your [facility], please call: [phone] or visit the homepage here: [email or web address]  If you would like to schedule transportation through your [facility], please call the following number at least 2 days in advance of your appointment: [phone]   Rides for urgent appointments can also be made after hours by calling Member Services.  Call the Behavioral Health Crisis Line at [phone], at any time, 24 hours a day, 7 days a week. If you are in danger or need immediate medical attention call 911.  If you would like help to quit smoking, call 1-800-QUIT-NOW ((562) 849-6811) OR Espaol: 1-855-Djelo-Ya (6-301-601-0932) o para ms informacin haga clic aqu or Text READY to 355-732 to register via text  Kirk Oconnor - following are the goals we discussed in your visit today:   Goals Addressed   None     Please see education materials related to HTN provided by MyChart link.  Patient verbalizes understanding of instructions and care plan provided today and agrees to view in MyChart. Active MyChart status and patient understanding of how to access instructions and care plan via MyChart confirmed with patient.     Telephone follow up appointment with Managed Medicaid care management team member scheduled for:10/23/23 at  1:15pm  Kirk Emms RN, BSN [place]  [facility] RN Care Coordinator [phone]   Following is a copy of your plan of care:  Care Plan : RN Care Manager Plan of Care  Updates made by Kirk Dach, RN since 09/22/2023 12:00 AM     Problem: Health Management needs related to Chronic Pain      Long-Range Goal: Development of Plan of Care to address Health Management needs related to Thyroid Disorder   Start Date: 03/21/2023  Expected End Date: 10/27/2023  Note:   Current Barriers:  Chronic Disease Management support and education needs related to HTN and Thyroid Disorder  Patient did not keep recent eye exam. He is aware of upcoming appointments and has transportation.  RNCM Clinical Goal(s):  Patient will verbalize understanding of plan for management of Chronic Pain as evidenced by patient reports take all medications exactly as prescribed and will call provider for medication related questions as evidenced by patient reports    attend all scheduled medical appointments:  10/05/23 with Endocrinology, 10/27/23 with Dr. Ronne Binning and PCP on 11/15/23 as evidenced by provider documentation in EMR        continue to work with RN Care Manager and/or Social Worker to address care management and care coordination needs related to chronic pain as evidenced by adherence to CM Team Scheduled appointments        Interventions: Inter-disciplinary care team collaboration (see longitudinal plan of care) Evaluation of current treatment plan related to  self management and patient's adherence to plan as established by provider Provided patient with contact information for [facility] [phone], advised patient to call and reschedule missed appointment  Thyroid Disorder  (Status:  Goal on track:  Yes.)  Long Term Goal Evaluation of current treatment plan related to  Thyroid Disorder ,  self-management and patient's adherence to plan as established by  provider. Discussed plans with patient for ongoing care management follow up and provided patient with direct contact information for care management team Reviewed medications with patient and discussed the importance of taking medications to upcoming provider appointments Reviewed scheduled/upcoming provider appointments including see list above Assessed social determinant of health barriers Reviewed patient's weight and discussed that he has gained #8 since June 2024 Encouraged patient to think positive thoughts, eat a healthy diet, exercise and get outside everyday Advised patient to request refill of Prilosec for acid reflux   Hypertension Interventions:  (Status:  Goal on track:  Yes.) Long Term Goal Last practice recorded BP readings:  BP Readings from Last 3 Encounters:  08/31/23 (!) 142/96  08/10/23 130/80  07/27/23 (!) 175/90   Most recent eGFR/CrCl:  Lab Results  Component Value Date   EGFR 91 08/31/2023    No components found for: "CRCL"  Evaluation of current treatment plan related to hypertension self management and patient's adherence to plan as established by provider Reviewed medications with patient and discussed importance of compliance Discussed plans with patient for ongoing care management follow up and provided patient with direct contact information for care management team Reviewed scheduled/upcoming provider appointments including: see list above Advised patient to exercise 30 minutes daily  Advised patient to focus on what he can do and not what he can't do Advised patient to keep a journal and discuss any concerns or questions with Provider Advised patient to check BP and record, take record to next PCP visit Provided education on diet and managing HTN   Patient Goals/Self-Care Activities: Take medications as prescribed   Attend all scheduled provider appointments Call provider office for new concerns or questions  Work with the social worker to  address care coordination needs and will continue to work with the clinical team to address health care and disease management related needs

## 2023-09-22 NOTE — Patient Outreach (Signed)
Medicaid Managed Care   Nurse Care Manager Note  09/22/2023 Name:  Kirk Oconnor MRN:  [id number] DOB:  [birth year]  Kirk Oconnor is an 67 y.o. year old male who is a primary patient of Kirk Nigel Berthold, FNP.  The [facility] Managed Care Coordination team was consulted for assistance with:    HTN Thyroid disorder  [name] was given information about Medicaid Managed Care Coordination team services today. Kirk Oconnor Patient agreed to services and verbal consent obtained.  Engaged with patient by telephone for follow up visit in response to provider referral for case management and/or care coordination services.   Patient is participating in a Managed Medicaid Plan:  Yes  Assessments/Interventions:  Review of past medical history, allergies, medications, health status, including review of consultants reports, laboratory and other test data, was performed as part of comprehensive evaluation and provision of chronic care management services.  SDOH (Social Drivers of Health) assessments and interventions performed: SDOH Interventions    Flowsheet Row Patient Outreach Telephone from 08/18/2023 in [place] [facility]UL[facility] DEPARTMENT Office Visit from 07/12/2023 in [facility] [facility] Patient Outreach Telephone from 03/21/2023 in [place] POPULATION HEALTH DEPARTMENT Office Visit from 03/01/2023 in [facility] [place] Office Visit from 01/20/2023 in [facility]  SDOH Interventions       Food Insecurity Interventions Intervention Not Indicated -- Intervention Not Indicated -- --  Housing Interventions Other (Comment)  [BSW referral] -- Other (Comment)  [Patient is scheduled with BSW on 03/23/23] AMB Referral --  Transportation Interventions Intervention Not Indicated -- Intervention Not Indicated -- --  Utilities Interventions Intervention Not Indicated -- Intervention Not Indicated -- --  Depression Interventions/Treatment  --  Medication, Counseling -- -- Counseling       Care Plan  No Known Allergies  Medications Reviewed Today     Reviewed by Kirk Dach, RN (Registered Nurse) on 09/22/23 at 1438  Med List Status: <None>   Medication Order Taking? Sig Documenting Provider Last Dose Status Informant  acetaminophen (TYLENOL) 500 MG tablet [id number] Yes Take 500 mg by mouth every 6 (six) hours as needed. [provider] Taking Active   AMBULATORY NON FORMULARY MEDICATION [id number]  Vacuum erection device. Dispense 1. Kirk Falls, FNP  Active   amLODipine (NORVASC) 5 MG tablet [id number] Yes Take 1 tablet (5 mg total) by mouth daily. Kirk Oconnor, Tenna Child, FNP Taking Active   aspirin EC 81 MG tablet [id number] Yes Take 81 mg by mouth daily. [provider] Taking Active Family Member  Blood Pressure Monitor DEVI [id number]  1 each by Does not apply route daily. Kirk Dory, NP  Active   fluconazole (DIFLUCAN) 150 MG tablet [id number] Yes Take two tablets by mouth on day 1. Repeat dose with 2 tablets by mouth on day 8 (1 week after 1st dose). Kirk Falls, FNP Taking Active   gabapentin (NEURONTIN) 300 MG capsule [id number] Yes Take 1 capsule (300 mg total) by mouth every 8 (eight) hours as needed. Kirk Nigel Berthold, FNP Taking Active   Levothyroxine Sodium 125 MCG CAPS [id number] No Use as directed 125 mcg in the mouth or throat daily.  Patient not taking: Reported on 09/22/2023   [provider] Not Taking Consider Medication Status and Discontinue (Patient Preference)   losartan (COZAAR) 25 MG tablet [id number] Yes Take 1 tablet (25 mg total) by mouth daily. Kirk Dory, NP Taking Active   meclizine (ANTIVERT) 25 MG  tablet [id number] Yes Take 1 tablet (25 mg total) by mouth 2 (two) times daily as needed for dizziness. Kirk Halon, MD Taking Active   metFORMIN (GLUCOPHAGE) 500 MG tablet [id number] Yes Take 1 tablet (500 mg total) by mouth daily with breakfast.  Kirk Oconnor, Tenna Child, FNP Taking Active   methimazole (TAPAZOLE) 5 MG tablet [id number] Yes Take 1 tablet (5 mg total) by mouth daily with breakfast. Kirk Kayser, MD Taking Active   omeprazole (PRILOSEC) 40 MG capsule [id number] No Take 1 capsule (40 mg total) by mouth 2 (two) times daily.  Patient not taking: Reported on 09/22/2023   Kirk Bal, DO Not Taking Active            Med Note Kirk Oconnor   Mon Mar 27, 2023  3:32 PM)    sildenafil (VIAGRA) 100 MG tablet [id number] Yes Take 1 tablet (100 mg total) by mouth daily as needed for erectile dysfunction. Kirk Falls, FNP Taking Active   tiZANidine (ZANAFLEX) 4 MG tablet [id number] Yes Take 1 tablet (4 mg total) by mouth every 8 (eight) hours as needed for muscle spasms. Kirk Nigel Berthold, FNP Taking Active             Patient Active Problem List   Diagnosis Date Noted   Depression, major, single episode, moderate (HCC) 07/12/2023   Hypersomnolence 06/19/2023   Dizziness 06/06/2023   Left anterior fascicular block (LAFB) 06/06/2023   Lumbar pain 04/25/2023   Hypertension 04/25/2023   Current smoker 04/05/2023   Hyponatremia 03/27/2023   Hyperthyroidism 03/27/2023   Erectile dysfunction 02/28/2023   Type 2 diabetes mellitus (HCC) 01/20/2023   Nocturia 01/18/2023   Unexplained weight loss 01/18/2023   Hepatic cirrhosis (HCC) 07/20/2022   Hyperglycemia 07/11/2017   Thrombocytopenia (HCC) 07/11/2017   Chronic back pain 06/26/2017   COPD (chronic obstructive pulmonary disease) (HCC) 04/14/2017   CVA (cerebral vascular accident) (HCC) 04/14/2017   History of alcohol abuse 04/14/2017   History of cocaine abuse (HCC) 04/14/2017   OSA (obstructive sleep apnea) 04/14/2017   BPH (benign prostatic hyperplasia) 10/22/2016   GERD (gastroesophageal reflux disease) 10/22/2016   Aortic root dilation (HCC) 10/21/2016   Acute diastolic CHF (congestive heart failure) (HCC) 10/21/2016   Essential hypertension,  benign 03/27/2013   Hepatitis C 03/27/2013   Snoring 03/27/2013    Conditions to be addressed/monitored per PCP order:  HTN and Thyroid Disorder  Care Plan : RN Care Manager Plan of Care  Updates made by Kirk Dach, RN since 09/22/2023 12:00 AM     Problem: Health Management needs related to Chronic Pain      Long-Range Goal: Development of Plan of Care to address Health Management needs related to Thyroid Disorder   Start Date: 03/21/2023  Expected End Date: 10/27/2023  Note:   Current Barriers:  Chronic Disease Management support and education needs related to HTN and Thyroid Disorder  Patient did not keep recent eye exam. He is aware of upcoming appointments and has transportation.  RNCM Clinical Goal(s):  Patient will verbalize understanding of plan for management of Chronic Pain as evidenced by patient reports take all medications exactly as prescribed and will call provider for medication related questions as evidenced by patient reports    attend all scheduled medical appointments:  10/05/23 with Endocrinology, 10/27/23 with Dr. Ronne Binning and PCP on 11/15/23 as evidenced by provider documentation in EMR        continue to work with RN Care Manager and/or  Social Worker to address care management and care coordination needs related to chronic pain as evidenced by adherence to CM Team Scheduled appointments        Interventions: Inter-disciplinary care team collaboration (see longitudinal plan of care) Evaluation of current treatment plan related to  self management and patient's adherence to plan as established by provider Provided patient with contact information for [facility] [phone], advised patient to call and reschedule missed appointment   Thyroid Disorder  (Status:  Goal on track:  Yes.)  Long Term Goal Evaluation of current treatment plan related to  Thyroid Disorder ,  self-management and patient's adherence to plan as established by provider. Discussed plans with  patient for ongoing care management follow up and provided patient with direct contact information for care management team Reviewed medications with patient and discussed the importance of taking medications to upcoming provider appointments Reviewed scheduled/upcoming provider appointments including see list above Assessed social determinant of health barriers Reviewed patient's weight and discussed that he has gained #8 since June 2024 Encouraged patient to think positive thoughts, eat a healthy diet, exercise and get outside everyday Advised patient to request refill of Prilosec for acid reflux   Hypertension Interventions:  (Status:  Goal on track:  Yes.) Long Term Goal Last practice recorded BP readings:  BP Readings from Last 3 Encounters:  08/31/23 (!) 142/96  08/10/23 130/80  07/27/23 (!) 175/90   Most recent eGFR/CrCl:  Lab Results  Component Value Date   EGFR 91 08/31/2023    No components found for: "CRCL"  Evaluation of current treatment plan related to hypertension self management and patient's adherence to plan as established by provider Reviewed medications with patient and discussed importance of compliance Discussed plans with patient for ongoing care management follow up and provided patient with direct contact information for care management team Reviewed scheduled/upcoming provider appointments including: see list above Advised patient to exercise 30 minutes daily  Advised patient to focus on what he can do and not what he can't do Advised patient to keep a journal and discuss any concerns or questions with Provider Advised patient to check BP and record, take record to next PCP visit Provided education on diet and managing HTN   Patient Goals/Self-Care Activities: Take medications as prescribed   Attend all scheduled provider appointments Call provider office for new concerns or questions  Work with the social worker to address care coordination needs and  will continue to work with the clinical team to address health care and disease management related needs       Follow Up:  Patient agrees to Care Plan and Follow-up.  Plan: The Managed Medicaid care management team will reach out to the patient again over the next 30 days.  Date/time of next scheduled RN care management/care coordination outreach:  10/23/23 at 1:15pm  [name] RN, BSN [place]  [facility] RN Care Coordinator [phone]

## 2023-09-29 ENCOUNTER — Other Ambulatory Visit (HOSPITAL_COMMUNITY)
Admission: RE | Admit: 2023-09-29 | Discharge: 2023-09-29 | Disposition: A | Discharge status: Home / Self Care | Payer: 59 | Source: Ambulatory Visit | Attending: "Endocrinology | Admitting: "Endocrinology

## 2023-09-29 DIAGNOSIS — E0590 Thyrotoxicosis, unspecified without thyrotoxic crisis or storm: Secondary | ICD-10-CM | POA: Insufficient documentation

## 2023-09-29 DIAGNOSIS — K7460 Unspecified cirrhosis of liver: Secondary | ICD-10-CM | POA: Diagnosis not present

## 2023-09-29 LAB — T4, FREE: Free T4: 1.03 ng/dL (ref 0.61–1.12)

## 2023-09-29 LAB — COMPREHENSIVE METABOLIC PANEL
ALT: 38 U/L (ref 0–44)
AST: 25 U/L (ref 15–41)
Albumin: 3.8 g/dL (ref 3.5–5.0)
Alkaline Phosphatase: 71 U/L (ref 38–126)
Anion gap: 6 (ref 5–15)
BUN: 15 mg/dL (ref 8–23)
CO2: 24 mmol/L (ref 22–32)
Calcium: 9.4 mg/dL (ref 8.9–10.3)
Chloride: 102 mmol/L (ref 98–111)
Creatinine, Ser: 0.8 mg/dL (ref 0.61–1.24)
GFR, Estimated: >60 mL/min (ref >60)
Glucose, Bld: 107 mg/dL — ABNORMAL HIGH (ref 70–99)
Potassium: 4.2 mmol/L (ref 3.5–5.1)
Sodium: 132 mmol/L — ABNORMAL LOW (ref 135–145)
Total Bilirubin: 0.6 mg/dL (ref 0.0–1.2)
Total Protein: 7.5 g/dL (ref 6.5–8.1)

## 2023-09-29 LAB — TSH: TSH: 0.022 u[IU]/mL — ABNORMAL LOW (ref 0.350–4.500)

## 2023-09-30 LAB — T3, FREE: T3, Free: 3.4 pg/mL (ref 2.0–4.4)

## 2023-10-03 ENCOUNTER — Ambulatory Visit: Payer: Self-pay | Admitting: Family Medicine

## 2023-10-03 NOTE — Telephone Encounter (Signed)
  Chief Complaint: want to know what diet to eat for pre diabetes, BUE hand numbness, does my throid med mix with my metformin  Symptoms: BUE hand numbness Frequency: 3 weeks Pertinent Negatives: Patient denies dizziness, denies weakness, denies GU s/s, denies fever  Disposition: [] ED /[] Urgent Care (no appt availability in office) / [x] Appointment(In office/virtual)/ []  [place] [facility]/ [] Home Care/ [x] Refused Recommended Disposition /[] [place] [facility]/ []  Follow-up with PCP  Additional Notes: Pt states he is calling about what he should eat, states he is worried because he is prediabetic. States that the PCP told him to increase his metformin  from once/day to BID. Pt states that he is worried that will interact with his thyroid  medication. Pt confirms he takes meds as prescribed. Pt states the hand numbness is intermittent and that it has been ongoing x3 weeks. Pt states he would like a nurse to come to the house and teach him about the diet he should be eating. Pt states that his pcp went over some of these things at his last visit but he cannot remember. Pt declines being scheduled for an appt at this time states that he isn't sure when he can get his sister to get him to an appt as he already has numerous appts this week and it will upset her to take him to to many appts. Pt would like PCP Iliana to call and speak to him. Pt was educated on proper diet for diabetic.  Copied from CRM [id number]. Topic: Clinical - Red Word Triage >> Oct 03, 2023  4:29 PM Ivette P wrote: Red Word that prompted transfer to Nurse Triage: numbness in both hands. Dizzy Reason for Disposition  [1] Numbness or tingling in one or both hands AND [2] is a chronic symptom (recurrent or ongoing AND present > 4 weeks)  Answer Assessment - Initial Assessment Questions 1. SYMPTOM: What is the main symptom you are concerned about? (e.g., weakness, numbness)     BUE numbness  2. ONSET: When did this start?  (minutes, hours, days; while sleeping)     3 weeks ago 3. LAST NORMAL: When was the last time you (the patient) were normal (no symptoms)?     3 weeks ago 4. PATTERN Does this come and go, or has it been constant since it started?  Is it present now?     intermittent 5. CARDIAC SYMPTOMS: Have you had any of the following symptoms: chest pain, difficulty breathing, palpitations?     denies 6. NEUROLOGIC SYMPTOMS: Have you had any of the following symptoms: headache, dizziness, vision loss, double vision, changes in speech, unsteady on your feet?     denies 7. OTHER SYMPTOMS: Do you have any other symptoms?     no  Protocols used: Neurologic Deficit-A-AH

## 2023-10-04 ENCOUNTER — Telehealth: Payer: Self-pay | Admitting: Family Medicine

## 2023-10-04 ENCOUNTER — Telehealth: Payer: Self-pay

## 2023-10-04 NOTE — Telephone Encounter (Signed)
 Copied from CRM [id number]. Topic: Clinical - Medical Advice >> Oct 03, 2023  4:27 PM Ivette P wrote: Reason for CRM: Pt requesting a call from DR. Iliana about his diabetic schedule. [phone]

## 2023-10-04 NOTE — Telephone Encounter (Signed)
 Left a message requesting pt caregiver to return call to office.

## 2023-10-04 NOTE — Telephone Encounter (Signed)
 Spoke to pt went over diabetes food plan from [place]

## 2023-10-05 ENCOUNTER — Encounter: Payer: Self-pay | Admitting: "Endocrinology

## 2023-10-05 ENCOUNTER — Ambulatory Visit (INDEPENDENT_AMBULATORY_CARE_PROVIDER_SITE_OTHER): Payer: 59 | Admitting: "Endocrinology

## 2023-10-05 VITALS — BP 116/84 | HR 80 | Ht 66.0 in | Wt 158.2 lb

## 2023-10-05 DIAGNOSIS — E119 Type 2 diabetes mellitus without complications: Secondary | ICD-10-CM

## 2023-10-05 DIAGNOSIS — E0590 Thyrotoxicosis, unspecified without thyrotoxic crisis or storm: Secondary | ICD-10-CM | POA: Diagnosis not present

## 2023-10-05 DIAGNOSIS — Z7984 Long term (current) use of oral hypoglycemic drugs: Secondary | ICD-10-CM | POA: Diagnosis not present

## 2023-10-05 DIAGNOSIS — I10 Essential (primary) hypertension: Secondary | ICD-10-CM | POA: Diagnosis not present

## 2023-10-05 NOTE — Patient Instructions (Signed)

## 2023-10-05 NOTE — Progress Notes (Signed)
 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   Arthritis    CAD (coronary artery disease)    Nonobstructive at cardiac catheterization July 2018 - [facility]   Carpal tunnel syndrome    Depression    Diabetes mellitus without complication (HCC)    Essential hypertension    GERD (gastroesophageal reflux disease)    Hepatitis C    Sleep apnea    Past Surgical History:  Procedure Laterality Date   AORTIC VALVE REPLACEMENT  03/2017   [facility]   BIOPSY  12/15/2022   Procedure: BIOPSY;  Surgeon: Cindie Carlin POUR, DO;  [facility]: [facility];  Service: Endoscopy;;   COLONOSCOPY WITH PROPOFOL  N/A 12/15/2022   Procedure: COLONOSCOPY WITH PROPOFOL ;  Surgeon: Cindie Carlin POUR, DO;  Location: [facility];  Service: Endoscopy;  Laterality: N/A;  10:00 am, asa 3   ESOPHAGOGASTRODUODENOSCOPY (EGD) WITH PROPOFOL  N/A 12/15/2022   Procedure: ESOPHAGOGASTRODUODENOSCOPY (EGD) WITH PROPOFOL ;  Surgeon: Cindie Carlin POUR, DO;  Location: [facility];  Service: Endoscopy;  Laterality: N/A;   POLYPECTOMY  12/15/2022   Procedure: POLYPECTOMY;  Surgeon: Cindie Carlin POUR, DO;  Location: [facility];  Service: Endoscopy;;   Social History   Socioeconomic History   Marital status: Divorced    Spouse name: Not on file   Number of children: Not on file   Years of education: Not on file   Highest education level: Not on file  Occupational History   Not on file  Tobacco Use   Smoking status: Some Days    Types: Cigarettes   Smokeless  tobacco: Never  Vaping Use   Vaping status: Never Used  Substance and Sexual Activity   Alcohol use: Yes    Comment: socially   Drug use: No   Sexual activity: Yes  Other Topics Concern   Not on file  Social History Narrative   Not on file   Social Drivers of Health   Financial Resource Strain: Low Risk  (08/22/2023)   Overall Financial Resource Strain (CARDIA)    Difficulty of Paying Living Expenses: Not very hard  Food Insecurity: No Food Insecurity (08/18/2023)   Hunger Vital Sign    Worried About Running Out of Food in the Last Year: Never true    Ran Out of Food in the Last Year: Never true  Transportation Needs: No Transportation Needs (08/18/2023)   PRAPARE - Administrator, Civil Service (Medical): No    Lack of Transportation (Non-Medical): No  Physical Activity: Not on file  Stress: Not on file  Social Connections: Not on file   Family History  Problem Relation Age of Onset   Diabetes Mother    Heart attack Mother    Heart attack Father  Outpatient Encounter Medications as of 10/05/2023  Medication Sig   acetaminophen  (TYLENOL ) 500 MG tablet Take 500 mg by mouth every 6 (six) hours as needed.   AMBULATORY NON FORMULARY MEDICATION Vacuum erection device. Dispense 1.   amLODipine  (NORVASC ) 5 MG tablet Take 1 tablet (5 mg total) by mouth daily.   aspirin  EC 81 MG tablet Take 81 mg by mouth daily.   Blood Pressure Monitor DEVI 1 each by Does not apply route daily.   fluconazole  (DIFLUCAN ) 150 MG tablet Take two tablets by mouth on day 1. Repeat dose with 2 tablets by mouth on day 8 (1 week after 1st dose).   gabapentin  (NEURONTIN ) 300 MG capsule Take 1 capsule (300 mg total) by mouth every 8 (eight) hours as needed.   Levothyroxine Sodium 125 MCG CAPS Use as directed 125 mcg in the mouth or throat daily. (Patient not taking: Reported on 09/22/2023)   losartan  (COZAAR ) 25 MG tablet Take 1 tablet (25 mg total) by mouth daily.   meclizine  (ANTIVERT ) 25 MG  tablet Take 1 tablet (25 mg total) by mouth 2 (two) times daily as needed for dizziness.   metFORMIN  (GLUCOPHAGE ) 500 MG tablet Take 1 tablet (500 mg total) by mouth daily with breakfast.   methimazole  (TAPAZOLE ) 5 MG tablet Take 1 tablet by mouth once daily with breakfast   omeprazole  (PRILOSEC) 40 MG capsule Take 1 capsule (40 mg total) by mouth 2 (two) times daily. (Patient not taking: Reported on 09/22/2023)   sildenafil  (VIAGRA ) 100 MG tablet TAKE 1 TABLET BY MOUTH ONCE DAILY AS NEEDED FOR ERECTILE DYSFUNCTION   tiZANidine  (ZANAFLEX ) 4 MG tablet Take 1 tablet (4 mg total) by mouth every 8 (eight) hours as needed for muscle spasms.   No facility-administered encounter medications on file as of 10/05/2023.   ALLERGIES: No Known Allergies  VACCINATION STATUS: Immunization History  Administered Date(s) Administered   Fluad Trivalent(High Dose 65+) 06/19/2023   Td 03/04/2019    HPI Kirk Oconnor is 68 y.o. male who presents today with a medical history as above. he is being seen in follow-up after he was seen in consultation for hyperthyroidism requested by Del Wilhelmena Lloyd Sola, FNP.   Patient is not an optimal historian, came alone today.   He was diagnosed with hyperthyroidism in April 2024.    -He is currently on minimal dose of methimazole  5 mg p.o. daily at breakfast.  He continues to generate symptomatic improvement including stabilizing weight over his last 3 visits.    His thyroid  uptake and scan showed 35% uptake at 24 hours.  He denies family history of thyroid  dysfunction.  His other medical problems include hypertension on treatment with amlodipine .  His records also show type 2 diabetes with A1c ranging between 6.6% and 7.1%.  He is currently on metformin  500 mg p.o. once a day.  He also has high blood pressure and hypercholesterolemia on treatment.    He denies dysphagia, shortness of breath, nor voice change.  He denies any personal history of goiter. Patient is  chronic smoker.  Review of Systems  Constitutional: + Mildly fluctuating body weight,   + fatigue, no subjective hyperthermia, no subjective hypothermia   Objective:       10/05/2023    9:32 AM 08/31/2023   11:16 AM 08/10/2023    8:36 AM  Vitals with BMI  Height 5' 6    Weight 158 lbs 3 oz 159 lbs 160 lbs 13 oz  BMI 25.55  24.46  Systolic 116 142 869  Diastolic 84 96 80  Pulse 80 76 88    BP 116/84   Pulse 80   Ht 5' 6 (1.676 m)   Wt 158 lb 3.2 oz (71.8 kg)   BMI 25.53 kg/m   Wt Readings from Last 3 Encounters:  10/05/23 158 lb 3.2 oz (71.8 kg)  08/31/23 159 lb (72.1 kg)  08/10/23 160 lb 12.8 oz (72.9 kg)    Physical Exam  Constitutional:  Body mass index is 25.53 kg/m.,  not in acute distress, normal state of mind Eyes: PERRLA, EOMI, no exophthalmos ENT: moist mucous membranes, no gross thyromegaly, no gross cervical lymphadenopathy    CMP ( most recent) CMP     Component Value Date/Time   NA 132 (L) 09/29/2023 1145   NA 133 (L) 08/31/2023 1205   K 4.2 09/29/2023 1145   CL 102 09/29/2023 1145   CO2 24 09/29/2023 1145   GLUCOSE 107 (H) 09/29/2023 1145   BUN 15 09/29/2023 1145   BUN 17 08/31/2023 1205   CREATININE 0.80 09/29/2023 1145   CREATININE 0.98 03/11/2013 1036   CALCIUM  9.4 09/29/2023 1145   PROT 7.5 09/29/2023 1145   PROT 6.8 08/31/2023 1205   ALBUMIN 3.8 09/29/2023 1145   ALBUMIN 4.2 08/31/2023 1205   AST 25 09/29/2023 1145   ALT 38 09/29/2023 1145   ALKPHOS 71 09/29/2023 1145   BILITOT 0.6 09/29/2023 1145   BILITOT 0.5 08/31/2023 1205   EGFR 91 08/31/2023 1205   GFRNONAA >60 09/29/2023 1145   GFRNONAA 87 03/11/2013 1036     Diabetic Labs (most recent): Lab Results  Component Value Date   HGBA1C 6.6 (H) 07/12/2023   HGBA1C 6.5 (H) 04/26/2023   HGBA1C 6.6 (H) 11/22/2022   MICROALBUR 6.0 (H) 01/27/2022     Lipid Panel ( most recent) Lipid Panel     Component Value Date/Time   CHOL 146 07/12/2023 1108   TRIG 112 07/12/2023  1108   HDL 37 (L) 07/12/2023 1108   CHOLHDL 3.9 07/12/2023 1108   CHOLHDL 4.8 01/05/2023 1146   VLDL 18 01/05/2023 1146   LDLCALC 88 07/12/2023 1108   LABVLDL 21 07/12/2023 1108      Lab Results  Component Value Date   TSH 0.022 (L) 09/29/2023   TSH 0.075 (L) 04/05/2023   TSH 0.026 (L) 03/27/2023   TSH <0.005 (L) 01/30/2023   TSH <0.005 (L) 01/20/2023   FREET4 1.03 09/29/2023   FREET4 0.86 04/05/2023   FREET4 1.07 03/27/2023   FREET4 1.76 01/30/2023   FREET4 2.14 (H) 01/20/2023       Assessment & Plan:   1. Hyperthyroidism  2.  Hypertension   3.  Hyperlipidemia 4.  Type 2 diabetes  - I have reviewed his new and available thyroid  records and clinically evaluated the patient along with the presence of his sister in the room.   -In light of his questionable compliance, ablating his thyroid  will create a chronic condition which he may have difficulty following up with.  Given the low uptake of 35%, I offered him reinitiation of low-dose methimazole  in lieu of RAI ablation.   He is advised to continue methimazole  5 mg p.o. daily at breakfast  with plan to repeat thyroid  function test and office visit in 4 months.  Side effects and precautions with this medicine were discussed with the family. His body weight is stabilizing, however, his recent weight loss is probably helping him control his diabetes, most recent A1c of  6.6%.  He is advised to continue metformin  500 mg p.o. daily at breakfast.  For his hyperlipidemia and hypertension, he is advised to continue rosuvastatin , amlodipine  as ordered.   The patient was counseled on the dangers of tobacco use, and was advised to quit.  Reviewed strategies to maximize success, including removing cigarettes and smoking materials from environment.    - he is advised to maintain close follow up with Del Wilhelmena Lloyd Sola, FNP for primary care needs.    I spent  22  minutes in the care of the patient today including review of labs from  Thyroid  Function, CMP, and other relevant labs ; imaging/biopsy records (current and previous including abstractions from other facilities); face-to-face time discussing  his lab results and symptoms, medications doses, his options of short and long term treatment based on the latest standards of care / guidelines;   and documenting the encounter.  Kirk Oconnor  participated in the discussions, expressed understanding, and voiced agreement with the above plans.  All questions were answered to his satisfaction. he is encouraged to contact clinic should he have any questions or concerns prior to his return visit.    Follow up plan: Return in about 4 months (around 02/02/2024) for Fasting Labs  in AM B4 8, A1c -[place].   Ranny Earl, MD [facility] [address] [place], [place] [address] Phone: [phone]  Fax: [phone]     10/05/2023, 2:11 PM  This note was partially dictated with voice recognition software. Similar sounding words can be transcribed inadequately or may not  be corrected upon review.

## 2023-10-09 ENCOUNTER — Ambulatory Visit: Payer: 59 | Admitting: Family Medicine

## 2023-10-11 ENCOUNTER — Ambulatory Visit (INDEPENDENT_AMBULATORY_CARE_PROVIDER_SITE_OTHER): Payer: 59 | Admitting: Physician Assistant

## 2023-10-11 NOTE — Progress Notes (Signed)
 Patient was not seen today. He was inappropriately scheduled with me as he already has a PCP. We assisted patient in scheduling him a follow up appointment with [facility] next week.

## 2023-10-18 NOTE — Patient Instructions (Signed)

## 2023-10-18 NOTE — Progress Notes (Unsigned)
   Established Patient Office Visit   Subjective  Patient ID: Kirk Oconnor, male    DOB: 06/28/1956  Age: 68 y.o. MRN: [id number]  No chief complaint on file.   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  HPI  ROS    Objective:     There were no vitals taken for this visit. {Vitals History (Optional):23777}  Physical Exam   No results found for any visits on 10/19/23.  The ASCVD Risk score (Arnett DK, et al., 2019) failed to calculate for the following reasons:   Risk score cannot be calculated because patient has a medical history suggesting prior/existing ASCVD    Assessment & Plan:  There are no diagnoses linked to this encounter.  No follow-ups on file.   Cruzita Lederer Newman Nip, FNP

## 2023-10-19 ENCOUNTER — Encounter: Payer: Self-pay | Admitting: Family Medicine

## 2023-10-19 ENCOUNTER — Ambulatory Visit (INDEPENDENT_AMBULATORY_CARE_PROVIDER_SITE_OTHER): Payer: 59 | Admitting: Family Medicine

## 2023-10-19 VITALS — BP 136/85 | HR 62 | Ht 66.0 in | Wt 162.1 lb

## 2023-10-19 DIAGNOSIS — D509 Iron deficiency anemia, unspecified: Secondary | ICD-10-CM

## 2023-10-19 DIAGNOSIS — M5441 Lumbago with sciatica, right side: Secondary | ICD-10-CM

## 2023-10-19 DIAGNOSIS — Z7984 Long term (current) use of oral hypoglycemic drugs: Secondary | ICD-10-CM

## 2023-10-19 DIAGNOSIS — E119 Type 2 diabetes mellitus without complications: Secondary | ICD-10-CM | POA: Diagnosis not present

## 2023-10-19 DIAGNOSIS — R8290 Unspecified abnormal findings in urine: Secondary | ICD-10-CM | POA: Insufficient documentation

## 2023-10-19 DIAGNOSIS — R42 Dizziness and giddiness: Secondary | ICD-10-CM | POA: Diagnosis not present

## 2023-10-19 DIAGNOSIS — E1169 Type 2 diabetes mellitus with other specified complication: Secondary | ICD-10-CM | POA: Diagnosis not present

## 2023-10-19 DIAGNOSIS — M5442 Lumbago with sciatica, left side: Secondary | ICD-10-CM

## 2023-10-19 DIAGNOSIS — G8929 Other chronic pain: Secondary | ICD-10-CM

## 2023-10-19 DIAGNOSIS — M4802 Spinal stenosis, cervical region: Secondary | ICD-10-CM | POA: Insufficient documentation

## 2023-10-19 MED ORDER — MECLIZINE HCL 25 MG PO TABS: 25.0000 mg | ORAL_TABLET | Freq: Two times a day (BID) | ORAL | 0 refills | Status: DC | PRN

## 2023-10-19 MED ORDER — METHYLPREDNISOLONE ACETATE 40 MG/ML IJ SUSP: 40.0000 mg | Freq: Once | INTRAMUSCULAR | Status: DC

## 2023-10-19 MED ORDER — TRAMADOL HCL 50 MG PO TABS: 50.0000 mg | ORAL_TABLET | Freq: Every day | ORAL | 0 refills | Status: AC | PRN

## 2023-10-19 NOTE — Assessment & Plan Note (Signed)
Depro-Medrol 40 mg IM injection given today  Tramadol 50 mg once daily PRN  MRI lumbar spine without contrast showing prominent spondylitic changes from L2-S1 most prominent at L4-5 where there is moderate posterior canal and severe right greater than left foraminal narrowing with possible encroachment on exiting nerve root on the right.  L2-3, L3-4 and L5-S1 also show mild posterior canal and severe bilateral right greater than left foraminal narrowing.   Patient refuses lumbar surgery and wants to proceed with pain management and physical therapy

## 2023-10-19 NOTE — Assessment & Plan Note (Signed)
Urinalysis, and urine culture ordered- Awaiting results will follow up. Advise on Avoid irritants like caffeine, alcohol, spicy foods, and artificial sweeteners, as they can aggravate your bladder. Wearing loose, breathable clothing, especially cotton underwear, can help keep the area dry and reduce bacterial growth. If symptoms persist or worsen follow up.

## 2023-10-19 NOTE — Assessment & Plan Note (Signed)
Unclear etiology, Continued worsening dizziness spells Patient is being follow by Cardiology   Trial on meclizine 25 PRN Orthostatic blood pressure measurements: Lying:136/86, Sitting:137/92 Standing 139/88  Labs ordered to rule out deficiency,  Iron panel, Vit B12 Referral placed to Neurology based on patient Recent MRI brain Discussed , sleep with your head slightly elevated and rise slowly from bed in the morning to prevent sudden dizziness. Limiting salt, caffeine, and alcohol may also reduce fluid buildup in the inner ear, which can lessen vertigo episodes. Avoid positions or activities that trigger symptoms, such as bending over or quickly turning your head. Additionally, focus on slow, steady movements

## 2023-10-19 NOTE — Assessment & Plan Note (Signed)
Last Hemoglobin A1c: 6.6, controlled Labs: Ordered today, results pending; will follow up accordingly. The patient reports adhering to prescribed medications: Metformin 500 mg once daily  Reviewed non-pharmacological interventions, including a balanced diet rich in lean proteins, healthy fats, whole grains, and high-fiber vegetables. Emphasized reducing refined sugars and processed carbohydrates, and incorporating more fruits, leafy greens, and legumes. Education: Patient was educated on recognizing signs and symptoms of both hypoglycemia and hyperglycemia, and advised to seek emergency care if these symptoms occur. Follow-Up: Scheduled for follow-up in 3-4 months, or sooner if needed. Patient Understanding: The patient verbalized understanding of the care plan, and all questions were answered. Additional Care: Ophthalmology referral was placed. Foot exam results were within normal limits.

## 2023-10-20 ENCOUNTER — Other Ambulatory Visit: Payer: Self-pay | Admitting: Family Medicine

## 2023-10-20 LAB — VITAMIN B12: Vitamin B-12: 317 pg/mL (ref 232–1245)

## 2023-10-20 LAB — IRON,TIBC AND FERRITIN PANEL
Ferritin: 54 ng/mL (ref 30–400)
Iron Saturation: 25 % (ref 15–55)
Iron: 70 ug/dL (ref 38–169)
Total Iron Binding Capacity: 283 ug/dL (ref 250–450)
UIBC: 213 ug/dL (ref 111–343)

## 2023-10-20 LAB — URINALYSIS
Bilirubin, UA: NEGATIVE
Glucose, UA: NEGATIVE
Ketones, UA: NEGATIVE
Leukocytes,UA: NEGATIVE
Nitrite, UA: NEGATIVE
Protein,UA: NEGATIVE
RBC, UA: NEGATIVE
Specific Gravity, UA: 1.025 (ref 1.005–1.030)
Urobilinogen, Ur: 1 mg/dL (ref 0.2–1.0)
pH, UA: 6 (ref 5.0–7.5)

## 2023-10-20 LAB — HEMOGLOBIN A1C
Est. average glucose Bld gHb Est-mCnc: 143 mg/dL
Hgb A1c MFr Bld: 6.6 % — ABNORMAL HIGH (ref 4.8–5.6)

## 2023-10-20 NOTE — Progress Notes (Signed)
Please inform patient ,Hemoglobin A1c level 6.6 type 2 diabetes controlled  Continue Metformin 500 mg once daily   A Sample Day of Eating for Diabetes :  Breakfast: Oatmeal with a handful of berries and a sprinkle of chia seeds, paired with a boiled egg.  Lunch: Grilled chicken breast on a bed of spinach and kale, topped with avocado, a drizzle of olive oil, and a slice of whole grain bread.  Snack: A handful of almonds or a small apple with peanut butter.  Dinner: Baked salmon with roasted broccoli and quinoa.  Snack (if needed): A piece of cheese or a small bowl of Greek yogurt.  Find an activity that you will enjoyandstart to be active at least 5 days a week for 30 minutes each day.

## 2023-10-21 LAB — URINE CULTURE: Organism ID, Bacteria: NO GROWTH

## 2023-10-23 ENCOUNTER — Telehealth: Payer: Self-pay | Admitting: Diagnostic Neuroimaging

## 2023-10-23 ENCOUNTER — Other Ambulatory Visit: Payer: Self-pay | Admitting: *Deleted

## 2023-10-23 NOTE — Patient Outreach (Signed)
Medicaid Managed Care   Nurse Care Manager Note  10/23/2023 Name:  Kirk Oconnor MRN:  [id number] DOB:  [birth year]  Kirk Oconnor is an 68 y.o. year old male who is a primary patient of Del Nigel Berthold, FNP.  The [facility] Managed Care Coordination team was consulted for assistance with:    HTN Thyroid disorder  Kirk Oconnor was given information about Medicaid Managed Care Coordination team services today. Kirk Oconnor Patient agreed to services and verbal consent obtained.  Engaged with patient by telephone for follow up visit in response to provider referral for case management and/or care coordination services.   Patient is participating in a Managed Medicaid Plan:  Yes  Assessments/Interventions:  Review of past medical history, allergies, medications, health status, including review of consultants reports, laboratory and other test data, was performed as part of comprehensive evaluation and provision of chronic care management services.  SDOH (Social Drivers of Health) assessments and interventions performed: SDOH Interventions    Flowsheet Row Patient Outreach Telephone from 08/18/2023 in [place] [facility]ATION [facility] DEPARTMENT Office Visit from 07/12/2023 in [facility] [place] [facility] Patient Outreach Telephone from 03/21/2023 in [place] POPULATION HEALTH DEPARTMENT Office Visit from 03/01/2023 in [facility] [place] Office Visit from 01/20/2023 in [facility] Primary [facility]  SDOH Interventions       Food Insecurity Interventions Intervention Not Indicated -- Intervention Not Indicated -- --  Housing Interventions Other (Comment)  [BSW referral] -- Other (Comment)  [Patient is scheduled with BSW on 03/23/23] AMB Referral --  Transportation Interventions Intervention Not Indicated -- Intervention Not Indicated -- --  Utilities Interventions Intervention Not Indicated -- Intervention Not Indicated -- --  Depression Interventions/Treatment  --  Medication, Counseling -- -- Counseling       Care Plan  No Known Allergies  Medications Reviewed Today     Reviewed by Heidi Dach, RN (Registered Nurse) on 10/23/23 at 1633  Med List Status: <None>   Medication Order Taking? Sig Documenting Provider Last Dose Status Informant  acetaminophen (TYLENOL) 500 MG tablet [id number] Yes Take 500 mg by mouth every 6 (six) hours as needed. [provider] Taking Active   AMBULATORY NON FORMULARY MEDICATION [id number]  Vacuum erection device. Dispense 1. Donnita Falls, FNP  Active   amLODipine (NORVASC) 5 MG tablet [id number] Yes Take 1 tablet (5 mg total) by mouth daily. Del Newman Nip, Tenna Child, FNP Taking Active   aspirin EC 81 MG tablet [id number] Yes Take 81 mg by mouth daily. [provider] Taking Active Family Member  Blood Pressure Monitor DEVI [id number] No 1 each by Does not apply route daily.  Patient not taking: Reported on 10/23/2023   [name], NP Not Taking Active   fluconazole (DIFLUCAN) 150 MG tablet [id number] No Take two tablets by mouth on day 1. Repeat dose with 2 tablets by mouth on day 8 (1 week after 1st dose).  Patient not taking: Reported on 10/23/2023   Donnita Falls, FNP Not Taking Active            Med Note (Danaysia Rader A   Mon Oct 23, 2023  4:20 PM) completed  gabapentin (NEURONTIN) 300 MG capsule [id number] Yes Take 1 capsule (300 mg total) by mouth every 8 (eight) hours as needed. Del Newman Nip, Tenna Child, FNP Taking Active   ketoconazole (NIZORAL) 2 % cream [id number] Yes Apply 1 Application topically 2 (two) times daily. [provider] Taking Active   ketoconazole (  NIZORAL) 2 % shampoo [id number] Yes Apply 1 Application topically once. [provider] Taking Active   Levothyroxine Sodium 125 MCG CAPS [id number] No Use as directed 125 mcg in the mouth or throat daily.  Patient not taking: Reported on 09/22/2023   [provider] Not Taking Active   losartan  (COZAAR) 25 MG tablet [id number] Yes Take 1 tablet (25 mg total) by mouth daily. [name], NP Taking Active   meclizine (ANTIVERT) 25 MG tablet [id number] No Take 1 tablet (25 mg total) by mouth 2 (two) times daily as needed for dizziness.  Patient not taking: Reported on 10/23/2023   [name], FNP Not Taking Active            Med Note (Sheritta Deeg A   Mon Oct 23, 2023  4:33 PM) Did not like the way it made him feel  metFORMIN (GLUCOPHAGE) 500 MG tablet [id number] Yes Take 1 tablet by mouth once daily with breakfast Del Newman Nip, [place], FNP Taking Active   methimazole (TAPAZOLE) 5 MG tablet [id number] Yes Take 1 tablet by mouth once daily with breakfast Roma Kayser, MD Taking Active   methylPREDNISolone acetate (DEPO-MEDROL) injection 40 mg [id number]   Del Nigel Berthold, FNP  Active   omeprazole (PRILOSEC) 40 MG capsule [id number] Yes Take 1 capsule (40 mg total) by mouth 2 (two) times daily. Lanelle Bal, DO Taking Active            Med Note Ephriam Knuckles, [facility]   Mon Mar 27, 2023  3:32 PM)    sildenafil (VIAGRA) 100 MG tablet [id number] Yes TAKE 1 TABLET BY MOUTH ONCE DAILY AS NEEDED FOR ERECTILE DYSFUNCTION Donnita Falls, FNP Taking Active   tiZANidine (ZANAFLEX) 4 MG tablet [id number] Yes Take 1 tablet (4 mg total) by mouth every 8 (eight) hours as needed for muscle spasms. Del Newman Nip, Tenna Child, FNP Taking Active   traMADol (ULTRAM) 50 MG tablet [id number] No Take 1 tablet (50 mg total) by mouth daily as needed.  Patient not taking: Reported on 10/23/2023   [name], FNP Not Taking Active            Med Note (Elexus Barman A   Mon Oct 23, 2023  4:32 PM) Did not like the way it made him feel            Patient Active Problem List   Diagnosis Date Noted   Spinal stenosis in cervical region 10/19/2023   Abnormal urine odor 10/19/2023   Depression, major, single episode, moderate (HCC) 07/12/2023   Hypersomnolence 06/19/2023    Dizziness 06/06/2023   Left anterior fascicular block (LAFB) 06/06/2023   Lumbar pain 04/25/2023   Hypertension 04/25/2023   Current smoker 04/05/2023   Hyponatremia 03/27/2023   Hyperthyroidism 03/27/2023   Erectile dysfunction 02/28/2023   Type 2 diabetes mellitus (HCC) 01/20/2023   Nocturia 01/18/2023   Unexplained weight loss 01/18/2023   Hepatic cirrhosis (HCC) 07/20/2022   Hyperglycemia 07/11/2017   Thrombocytopenia (HCC) 07/11/2017   Chronic back pain 06/26/2017   COPD (chronic obstructive pulmonary disease) (HCC) 04/14/2017   CVA (cerebral vascular accident) (HCC) 04/14/2017   History of alcohol abuse 04/14/2017   History of cocaine abuse (HCC) 04/14/2017   OSA (obstructive sleep apnea) 04/14/2017   History of tobacco abuse 04/14/2017   BPH (benign prostatic hyperplasia) 10/22/2016   GERD (gastroesophageal reflux disease) 10/22/2016   Aortic root dilation (HCC) 10/21/2016   Acute diastolic CHF (congestive heart  failure) (HCC) 10/21/2016   Essential hypertension, benign 03/27/2013   Hepatitis C 03/27/2013   Snoring 03/27/2013    Conditions to be addressed/monitored per PCP order:  HTN and Thyroid disorder  Care Plan : RN Care Manager Plan of Care  Updates made by Heidi Dach, RN since 10/23/2023 12:00 AM     Problem: Health Management needs related to Chronic Pain      Long-Range Goal: Development of Plan of Care to address Health Management needs related to Thyroid Disorder   Start Date: 03/21/2023  Expected End Date: 10/27/2023  Note:   Current Barriers:  Chronic Disease Management support and education needs related to HTN and Thyroid Disorder  Patient concerned about having dark urine. Reports an odor and dark color. Last seen by PCP 10/19/23.  RNCM Clinical Goal(s):  Patient will verbalize understanding of plan for management of Chronic Pain as evidenced by patient reports take all medications exactly as prescribed and will call provider for medication  related questions as evidenced by patient reports    attend all scheduled medical appointments:   10/27/23 with Dr. Ronne Binning, PT evaluation on 10/31/23 and PCP on 11/15/23 as evidenced by provider documentation in EMR        continue to work with RN Care Manager and/or Social Worker to address care management and care coordination needs related to chronic pain as evidenced by adherence to CM Team Scheduled appointments        Interventions: Inter-disciplinary care team collaboration (see longitudinal plan of care) Evaluation of current treatment plan related to  self management and patient's adherence to plan as established by provider Provided patient with contact information for [facility] [phone], advised patient to call and reschedule missed appointment-revisited   Thyroid Disorder  (Status:  Goal on track:  Yes.)  Long Term Goal Evaluation of current treatment plan related to  Thyroid Disorder ,  self-management and patient's adherence to plan as established by provider. Discussed plans with patient for ongoing care management follow up and provided patient with direct contact information for care management team Reviewed medications with patient and discussed newly prescribed tramadol is for pain and to be taken as needed, advised to avoid if he does not like the side effects(dizziness) Reviewed scheduled/upcoming provider appointments including see list above Assessed social determinant of health barriers Encouraged patient to think positive thoughts, eat a healthy diet, exercise and get outside everyday Reviewed recent labs and urine analysis and discussed with patient Advised patient to think positive thoughts and discuss concerns with provider   Hypertension Interventions:  (Status:  Goal on track:  Yes.) Long Term Goal Last practice recorded BP readings:  BP Readings from Last 3 Encounters:  10/19/23 136/85  10/05/23 116/84  08/31/23 (!) 142/96   Most recent eGFR/CrCl:  Lab  Results  Component Value Date   EGFR 91 08/31/2023    No components found for: "CRCL"  Evaluation of current treatment plan related to hypertension self management and patient's adherence to plan as established by provider Reviewed medications with patient and discussed importance of compliance Discussed plans with patient for ongoing care management follow up and provided patient with direct contact information for care management team Reviewed scheduled/upcoming provider appointments including: see list above Advised patient to exercise 30 minutes daily  Advised patient to focus on what he can do and not what he can't do Advised patient to keep a journal and discuss any concerns or questions with Provider Advised patient to check BP and record, take  record to next PCP visit Provided education on diet and managing HTN   Patient Goals/Self-Care Activities: Take medications as prescribed   Attend all scheduled provider appointments Call provider office for new concerns or questions  Work with the social worker to address care coordination needs and will continue to work with the clinical team to address health care and disease management related needs       Follow Up:  Patient agrees to Care Plan and Follow-up.  Plan: The Managed Medicaid care management team will reach out to the patient again over the next 30 days.  Date/time of next scheduled RN care management/care coordination outreach:  11/27/23 at 1:15pm  [name] RN, BSN [place]  [facility] RN Care Manager [phone]

## 2023-10-23 NOTE — Patient Instructions (Signed)
Visit Information  Kirk Oconnor was given information about Medicaid Managed Care team care coordination services as a part of their [facility] Medicaid benefit. Kirk Oconnor verbally consented to engagement with the [facility] Managed Care team.   If you are experiencing a medical emergency, please call 911 or report to your local emergency department or urgent care.   If you have a non-emergency medical problem during routine business hours, please contact your provider's office and ask to speak with a nurse.   For questions related to your [facility], please call: [phone] or visit the homepage here: [email or web address]  If you would like to schedule transportation through your [facility], please call the following number at least 2 days in advance of your appointment: [phone]   Rides for urgent appointments can also be made after hours by calling Member Services.  Call the Behavioral Health Crisis Line at [phone], at any time, 24 hours a day, 7 days a week. If you are in danger or need immediate medical attention call 911.  If you would like help to quit smoking, call 1-800-QUIT-NOW (580 549 5523) OR Espaol: 1-855-Djelo-Ya (0-102-725-3664) o para ms informacin haga clic aqu or Text READY to 403-474 to register via text  Kirk Oconnor,    Patient verbalizes understanding of instructions and care plan provided today and agrees to view in MyChart. Active MyChart status and patient understanding of how to access instructions and care plan via MyChart confirmed with patient.     Telephone follow up appointment with Managed Medicaid care management team member scheduled for:11/27/23 at 1:15pm  [name] RN, BSN   [facility] RN Care Manager [phone]   Following is a copy of your plan of care:   Care Plan : RN Care Manager Plan of Care  Updates made by Heidi Dach, RN since 10/23/2023 12:00 AM     Problem: Health Management needs related to Chronic Pain      Long-Range Goal: Development of Plan of Care to address Health Management needs related to Thyroid Disorder   Start Date: 03/21/2023  Expected End Date: 10/27/2023  Note:   Current Barriers:  Chronic Disease Management support and education needs related to HTN and Thyroid Disorder  Patient concerned about having dark urine. Reports an odor and dark color. Last seen by PCP 10/19/23.  RNCM Clinical Goal(s):  Patient will verbalize understanding of plan for management of Chronic Pain as evidenced by patient reports take all medications exactly as prescribed and will call provider for medication related questions as evidenced by patient reports    attend all scheduled medical appointments:   10/27/23 with Dr. Ronne Binning, PT evaluation on 10/31/23 and PCP on 11/15/23 as evidenced by provider documentation in EMR        continue to work with RN Care Manager and/or Social Worker to address care management and care coordination needs related to chronic pain as evidenced by adherence to CM Team Scheduled appointments        Interventions: Inter-disciplinary care team collaboration (see longitudinal plan of care) Evaluation of current treatment plan related to  self management and patient's adherence to plan as established by provider Provided patient with contact information for [facility] [phone], advised patient to call and reschedule missed appointment-revisited   Thyroid Disorder  (Status:  Goal on track:  Yes.)  Long Term Goal Evaluation of current treatment plan related to  Thyroid Disorder ,  self-management and  patient's adherence to plan as established by provider. Discussed plans with patient for ongoing care management follow up and provided patient with direct contact information for care management team Reviewed  medications with patient and discussed newly prescribed tramadol is for pain and to be taken as needed, advised to avoid if he does not like the side effects(dizziness) Reviewed scheduled/upcoming provider appointments including see list above Assessed social determinant of health barriers Encouraged patient to think positive thoughts, eat a healthy diet, exercise and get outside everyday Reviewed recent labs and urine analysis and discussed with patient Advised patient to think positive thoughts and discuss concerns with provider   Hypertension Interventions:  (Status:  Goal on track:  Yes.) Long Term Goal Last practice recorded BP readings:  BP Readings from Last 3 Encounters:  10/19/23 136/85  10/05/23 116/84  08/31/23 (!) 142/96   Most recent eGFR/CrCl:  Lab Results  Component Value Date   EGFR 91 08/31/2023    No components found for: "CRCL"  Evaluation of current treatment plan related to hypertension self management and patient's adherence to plan as established by provider Reviewed medications with patient and discussed importance of compliance Discussed plans with patient for ongoing care management follow up and provided patient with direct contact information for care management team Reviewed scheduled/upcoming provider appointments including: see list above Advised patient to exercise 30 minutes daily  Advised patient to focus on what he can do and not what he can't do Advised patient to keep a journal and discuss any concerns or questions with Provider Advised patient to check BP and record, take record to next PCP visit Provided education on diet and managing HTN   Patient Goals/Self-Care Activities: Take medications as prescribed   Attend all scheduled provider appointments Call provider office for new concerns or questions  Work with the social worker to address care coordination needs and will continue to work with the clinical team to address health care and  disease management related needs

## 2023-10-23 NOTE — Telephone Encounter (Signed)
Sister called to r/s appointment due to a conflict

## 2023-10-27 ENCOUNTER — Ambulatory Visit (INDEPENDENT_AMBULATORY_CARE_PROVIDER_SITE_OTHER): Payer: 59 | Admitting: Urology

## 2023-10-27 VITALS — BP 153/107 | HR 69

## 2023-10-27 DIAGNOSIS — N529 Male erectile dysfunction, unspecified: Secondary | ICD-10-CM | POA: Diagnosis not present

## 2023-10-27 MED ORDER — SILDENAFIL CITRATE 100 MG PO TABS: 100.0000 mg | ORAL_TABLET | ORAL | 5 refills | Status: DC | PRN

## 2023-10-27 MED ORDER — SILDENAFIL CITRATE 20 MG PO TABS: 20.0000 mg | ORAL_TABLET | Freq: Every day | ORAL | 5 refills | Status: DC

## 2023-10-27 NOTE — Progress Notes (Unsigned)
10/27/2023 12:20 PM   Kirk Oconnor Kirk Oconnor 1955/11/28 [id number]  Referring provider: [name], FNP [phone] [address] [place],  [place] [address]  No chief complaint on file.   HPI: [name] is a 68yo here for followup for erectile dysfunction. He is using sildenafil 100mg  PRN which gives him an erection the next day, He is trying not to eat within 2 hours of taking the medication.    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   Arthritis    CAD (coronary artery disease)    Nonobstructive at cardiac catheterization July 2018 - [facility]   Carpal tunnel syndrome    Depression    Diabetes mellitus without complication (HCC)    Essential hypertension    GERD (gastroesophageal reflux disease)    Hepatitis C    Sleep apnea     Surgical History: Past Surgical History:  Procedure Laterality Date   AORTIC VALVE REPLACEMENT  03/2017   [facility]   BIOPSY  12/15/2022   Procedure: BIOPSY;  Surgeon: Lanelle Bal, DO;  [facility];  Service: Endoscopy;;   COLONOSCOPY WITH PROPOFOL N/A 12/15/2022   Procedure: COLONOSCOPY WITH PROPOFOL;  Surgeon: Lanelle Bal, DO;  [facility];  Service: Endoscopy;  Laterality: N/A;  10:00 am, asa 3   ESOPHAGOGASTRODUODENOSCOPY (EGD) WITH PROPOFOL N/A 12/15/2022   Procedure: ESOPHAGOGASTRODUODENOSCOPY (EGD) WITH PROPOFOL;  Surgeon: Lanelle Bal, DO;  [facility];  Service: Endoscopy;  Laterality: N/A;   POLYPECTOMY  12/15/2022   Procedure: POLYPECTOMY;  Surgeon: Lanelle Bal, DO;  [facility];  Service: Endoscopy;;    Home Medications:  Allergies as of 10/27/2023   No Known Allergies      Medication List         Accurate as of October 27, 2023 12:20 PM. If you have any questions, ask your nurse or doctor.          acetaminophen 500 MG tablet Commonly known as: TYLENOL Take 500 mg by mouth every 6 (six) hours as needed.   AMBULATORY NON FORMULARY MEDICATION Vacuum erection device. Dispense 1.   amLODipine 5 MG tablet Commonly known as: NORVASC Take 1 tablet (5 mg total) by mouth daily.   aspirin EC 81 MG tablet Take 81 mg by mouth daily.   Blood Pressure Monitor Devi 1 each by Does not apply route daily.   fluconazole 150 MG tablet Commonly known as: DIFLUCAN Take two tablets by mouth on day 1. Repeat dose with 2 tablets by mouth on day 8 (1 week after 1st dose).   gabapentin 300 MG capsule Commonly known as: NEURONTIN Take 1 capsule (300 mg total) by mouth every 8 (eight) hours as needed.   ketoconazole 2 % cream Commonly known as: NIZORAL Apply 1 Application topically 2 (two) times daily.   ketoconazole 2 % shampoo Commonly known as: NIZORAL Apply 1 Application topically once.   Levothyroxine Sodium 125 MCG Caps Use as directed 125 mcg in the mouth or throat daily.   losartan 25 MG tablet Commonly known as: COZAAR Take 1 tablet (25 mg total) by mouth daily.   meclizine 25 MG tablet Commonly known as: ANTIVERT  Take 1 tablet (25 mg total) by mouth 2 (two) times daily as needed for dizziness.   metFORMIN 500 MG tablet Commonly known as: GLUCOPHAGE Take 1 tablet by mouth once daily with breakfast   methimazole 5 MG tablet Commonly known as: TAPAZOLE Take 1 tablet by mouth once daily with breakfast   omeprazole 40 MG capsule Commonly known as: PRILOSEC Take 1 capsule (40 mg total) by mouth 2 (two) times daily.   sildenafil 100 MG tablet Commonly known as: VIAGRA TAKE 1 TABLET BY MOUTH ONCE DAILY AS NEEDED FOR ERECTILE DYSFUNCTION   tiZANidine 4 MG tablet Commonly known as: Zanaflex Take 1 tablet (4 mg total) by mouth every 8 (eight) hours as needed for muscle  spasms.   traMADol 50 MG tablet Commonly known as: ULTRAM Take 1 tablet (50 mg total) by mouth daily as needed.        Allergies: No Known Allergies  Family History: Family History  Problem Relation Age of Onset   Diabetes Mother    Heart attack Mother    Heart attack Father     Social History:  reports that he has been smoking cigarettes. He has never used smokeless tobacco. He reports current alcohol use. He reports that he does not use drugs.  ROS: All other review of systems were reviewed and are negative except what is noted above in HPI  Physical Exam: BP (!) 153/107   Pulse 69   Constitutional:  Alert and oriented, No acute distress. HEENT: [place] AT, moist mucus membranes.  Trachea midline, no masses. Cardiovascular: No clubbing, cyanosis, or edema. Respiratory: Normal respiratory effort, no increased work of breathing. GI: Abdomen is soft, nontender, nondistended, no abdominal masses GU: No CVA tenderness.  Lymph: No cervical or inguinal lymphadenopathy. Skin: No rashes, bruises or suspicious lesions. Neurologic: Grossly intact, no focal deficits, moving all 4 extremities. Psychiatric: Normal mood and affect.  Laboratory Data: Lab Results  Component Value Date   WBC 7.0 06/19/2023   HGB 14.7 06/19/2023   HCT 44.5 06/19/2023   MCV 89 06/19/2023   PLT 196 06/19/2023    Lab Results  Component Value Date   CREATININE 0.80 09/29/2023    Lab Results  Component Value Date   PSA 1.68 03/11/2013    Lab Results  Component Value Date   TESTOSTERONE 706 06/19/2023    Lab Results  Component Value Date   HGBA1C 6.6 (H) 10/19/2023    Urinalysis    Component Value Date/Time   COLORURINE YELLOW 01/13/2023 1244   APPEARANCEUR Clear 10/19/2023 1025   LABSPEC 1.021 01/13/2023 1244   PHURINE 6.0 01/13/2023 1244   GLUCOSEU Negative 10/19/2023 1025   HGBUR NEGATIVE 01/13/2023 1244   BILIRUBINUR Negative 10/19/2023 1025   KETONESUR NEGATIVE 01/13/2023 1244    PROTEINUR Negative 10/19/2023 1025   PROTEINUR NEGATIVE 01/13/2023 1244   UROBILINOGEN 1.0 11/30/2011 1910   NITRITE Negative 10/19/2023 1025   NITRITE NEGATIVE 01/13/2023 1244   LEUKOCYTESUR Negative 10/19/2023 1025   LEUKOCYTESUR NEGATIVE 01/13/2023 1244    Lab Results  Component Value Date   LABMICR Comment 08/31/2023    Pertinent Imaging: *** No results found for this or any previous visit.  No results found for this or any previous visit.  No results found for this or any previous visit.  No results found for this or any previous visit.  No results found for this or any previous visit.  No results found for this or any previous visit.  No results found  for this or any previous visit.  No results found for this or any previous visit.   Assessment & Plan:    1. Erectile dysfunction, unspecified erectile dysfunction type (Primary) -we will trial sildenafil 20mg  daily and then 50-100mg  PRN   No follow-ups on file.  Wilkie Aye, MD  [facility] [place]

## 2023-10-31 ENCOUNTER — Ambulatory Visit (HOSPITAL_COMMUNITY): Payer: 59 | Attending: Family Medicine

## 2023-10-31 ENCOUNTER — Encounter: Payer: Self-pay | Admitting: Urology

## 2023-10-31 ENCOUNTER — Other Ambulatory Visit: Payer: Self-pay

## 2023-10-31 ENCOUNTER — Encounter (HOSPITAL_COMMUNITY): Payer: Self-pay

## 2023-10-31 DIAGNOSIS — G8929 Other chronic pain: Secondary | ICD-10-CM | POA: Diagnosis not present

## 2023-10-31 DIAGNOSIS — Z7409 Other reduced mobility: Secondary | ICD-10-CM | POA: Diagnosis not present

## 2023-10-31 DIAGNOSIS — M5442 Lumbago with sciatica, left side: Secondary | ICD-10-CM | POA: Diagnosis not present

## 2023-10-31 DIAGNOSIS — M5441 Lumbago with sciatica, right side: Secondary | ICD-10-CM | POA: Insufficient documentation

## 2023-10-31 DIAGNOSIS — M6281 Muscle weakness (generalized): Secondary | ICD-10-CM | POA: Diagnosis not present

## 2023-10-31 NOTE — Therapy (Addendum)
 OUTPATIENT PHYSICAL THERAPY THORACOLUMBAR EVALUATION   Patient Name: Kirk Oconnor MRN: [id number] DOB:March 20, 1956, 68 y.o., male Today's Date: 10/31/2023  END OF SESSION:  PT End of Session - 10/31/23 1012     Visit Number 1    Date for PT Re-Evaluation 12/12/23    Authorization Type [employer] Dual complete    Authorization Time Period no auth; no limit    Progress Note Due on Visit 10    PT Start Time 0930    PT Stop Time 1010    PT Time Calculation (min) 40 min    Activity Tolerance Patient tolerated treatment well    Behavior During Therapy WFL for tasks assessed/performed             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   Arthritis    CAD (coronary artery disease)    Nonobstructive at cardiac catheterization July 2018 - [facility]   Carpal tunnel syndrome    Depression    Diabetes mellitus without complication (HCC)    Essential hypertension    GERD (gastroesophageal reflux disease)    Hepatitis C    Sleep apnea    Past Surgical History:  Procedure Laterality Date   AORTIC VALVE REPLACEMENT  03/2017   [facility]   BIOPSY  12/15/2022   Procedure: BIOPSY;  Surgeon: Cindie Carlin POUR, DO;  [facility];  Service: Endoscopy;;   COLONOSCOPY WITH PROPOFOL  N/A 12/15/2022   Procedure: COLONOSCOPY WITH PROPOFOL ;  Surgeon: Cindie Carlin POUR, DO;  [facility];  Service: Endoscopy;  Laterality: N/A;  10:00 am, asa 3   ESOPHAGOGASTRODUODENOSCOPY (EGD) WITH PROPOFOL  N/A 12/15/2022   Procedure: ESOPHAGOGASTRODUODENOSCOPY (EGD) WITH PROPOFOL ;  Surgeon: Cindie Carlin POUR, DO;  [facility];  Service: Endoscopy;  Laterality: N/A;   POLYPECTOMY  12/15/2022   Procedure: POLYPECTOMY;  Surgeon: Cindie Carlin POUR, DO;  [facility];  Service: Endoscopy;;   Patient Active Problem List   Diagnosis Date Noted   Spinal stenosis in cervical region 10/19/2023   Abnormal urine odor 10/19/2023   Depression, major, single episode, moderate (HCC) 07/12/2023   Hypersomnolence 06/19/2023   Dizziness 06/06/2023   Left anterior fascicular block (LAFB) 06/06/2023   Lumbar pain 04/25/2023   Hypertension 04/25/2023   Current smoker 04/05/2023   Hyponatremia 03/27/2023   Hyperthyroidism 03/27/2023   Erectile dysfunction 02/28/2023   Type 2 diabetes mellitus (HCC) 01/20/2023   Nocturia 01/18/2023   Unexplained weight loss 01/18/2023   Hepatic cirrhosis (HCC) 07/20/2022   Hyperglycemia 07/11/2017   Thrombocytopenia (HCC) 07/11/2017   Chronic back pain 06/26/2017   COPD (chronic obstructive pulmonary disease) (HCC) 04/14/2017   CVA (cerebral vascular accident) (HCC) 04/14/2017   History of alcohol abuse 04/14/2017   History of cocaine abuse (HCC) 04/14/2017   OSA (obstructive sleep apnea) 04/14/2017   History of tobacco abuse 04/14/2017   BPH (benign prostatic hyperplasia) 10/22/2016   GERD (gastroesophageal reflux disease) 10/22/2016   Aortic root dilation (HCC) 10/21/2016   Acute diastolic CHF (congestive heart failure) (HCC) 10/21/2016   Essential hypertension, benign 03/27/2013   Hepatitis C 03/27/2013   Snoring 03/27/2013    PCP: Terry Wilhelmena Lloyd hilario, FNP  REFERRING PROVIDER: Terry Wilhelmena Lloyd,  iliana, FNP  REFERRING DIAG: M54.42,M54.41,G89.29 (ICD-10-CM) - Chronic bilateral low back pain with bilateral sciatica  Rationale for Evaluation and Treatment: Rehabilitation  THERAPY DIAG:  Chronic bilateral low back pain with bilateral sciatica  Impaired functional mobility, balance, gait, and endurance  Muscle weakness (generalized)  ONSET DATE: 7 years  SUBJECTIVE:                                                                                                                                                                                            SUBJECTIVE STATEMENT: 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 Pt had 7 vertebrae broken and surgery. Pt takes gabapentin  for LBP and finds relief with this medication. Pt followed by many disciplines: Neurology for spinal stenosis of cervical, and per patient: a 'brain tumor,' and endrinology for overactive thyroid . Fast, repetitive movements overtime will cause increase balance deficits per patient.   Chart reviewed for brain tumor, where no diagnoses has been made but pt continued to report he has brain tumor.   PERTINENT HISTORY:  CAD Essential HTN Hepatitis C Anxiety Depression Overactive Thryoid  PAIN:  Are you having pain?  Currently 0/10. At worse 10/10.Sharp pain that makes pt sit down. Medication, rest ease pain. Once medication runs out, pain increases or cold weather affects pain.    PRECAUTIONS: None  RED FLAGS: None   WEIGHT BEARING RESTRICTIONS: No  FALLS:  Has patient fallen in last 6 months? No   PATIENT GOALS: balance is my biggest concern  OBJECTIVE:  Note: Objective measures were completed at Evaluation unless otherwise noted.  DIAGNOSTIC FINDINGS:  01/13/23 MRI brain 1. Small focus of hyperintense signal on diffusion weighted imaging in the periatrial white matter on the left, which could represent an acute infarct. Note if there is clinical concern for intracranial metastatic disease, further evaluation with a contrast-enhanced MRI is recommended. 2. Innumerable small foci of microhemorrhage in the bilateral cerebral and cerebellar hemispheres, which are nonspecific but can be seen in the setting of cerebral amyloid angiopathy. 3. Diffusely T1 hypointense appearance of the cervical spine vertebral bodies, nonspecific, but marrow infiltrative process is not excluded. There is likely also severe spinal canal  stenosis at C3-C4. Recommend further evaluation with a cervical spine MRI.  PATIENT SURVEYS:  ABC scale 1230/1600= 76.9% Modified Oswestry 12/50= 40%   COGNITION: Overall cognitive status: Within functional limits for tasks assessed     SENSATION: WFL  POSTURE: increased lumbar lordosis  PALPATION: Tender to palpate along lumbar paraspinals  LUMBAR ROM:   AROM eval  Flexion 100  Extension  50% * and painful  Right lateral flexion 50%  Left lateral flexion 50%  Right rotation   Left rotation    (Blank rows = not tested)  LOWER EXTREMITY ROM:     Active  Right eval Left eval  Hip flexion    Hip extension    Hip abduction    Hip adduction    Hip internal rotation    Hip external rotation    Knee flexion    Knee extension    Ankle dorsiflexion    Ankle plantarflexion    Ankle inversion    Ankle eversion     (Blank rows = not tested)  LOWER EXTREMITY MMT:    MMT Right eval Left eval  Hip flexion    Hip extension    Hip abduction    Hip adduction    Hip internal rotation    Hip external rotation    Knee flexion    Knee extension    Ankle dorsiflexion    Ankle plantarflexion    Ankle inversion    Ankle eversion     (Blank rows = not tested)  FUNCTIONAL TESTS:  30 seconds chair stand test: 10x 2 minute walk test: 254ft Single Leg Balance: R 2 seconds  : unable to perform  GAIT: Distance walked: 259ft Assistive device utilized: None Level of assistance: Complete Independence Comments: bilateral trunk sway with bilateral circumduction of BLE during swing phase and foot flat contact throughout entire gait cycle. Waddling like appearance with shoulder adduction during swing phase.   TREATMENT DATE:   10/31/2023 PT Evaluation                                                                                                                                 PATIENT EDUCATION:  Education details: PT Evaluation, findings, prognosis, frequency, attendance  policy, and HEP if given.  Person educated: Patient Education method: Medical Illustrator Education comprehension: verbalized understanding  HOME EXERCISE PROGRAM: To initiate next session  ASSESSMENT:  CLINICAL IMPRESSION: Patient is a 68 y.o. male who was seen today for physical therapy evaluation and treatment for M54.42,M54.41,G89.29 (ICD-10-CM) - Chronic bilateral low back pain with bilateral sciatica. Pt with multiple system involvement which overlaps with chronic low back pain. Pt has involvement of cervical spine stenosis, pt potentially has brain cancer, per patient, as well is followed by endocrinology and other providers, increasing pt's complexity evaluation due to multiple sources of body system overlapping pt's objective impairments. Pt demonstrating significant limitations in functional mobility, ADLs, transfers and safety due to muscle weakness, balance deficits, ROM restrictions and chronic pain. Pt will benefit from skilled Physical Therapy services to address deficits/limitations in order to improve functional and QOL.    OBJECTIVE IMPAIRMENTS: Abnormal gait, decreased activity tolerance, decreased balance, decreased mobility, decreased ROM, decreased strength, dizziness, postural dysfunction, and pain.   ACTIVITY LIMITATIONS: carrying, lifting, bending, standing, transfers, and locomotion level  PARTICIPATION LIMITATIONS: community activity  and occupation  PERSONAL FACTORS: Age and 3+ comorbidities: see above for details  are also affecting patient's functional outcome.   REHAB POTENTIAL: Fair increased complexity and pending results  CLINICAL DECISION MAKING: Unstable/unpredictable  EVALUATION COMPLEXITY: High   GOALS: Goals reviewed with patient? No  SHORT TERM GOALS: Target date: 11/21/2023  Pt will be independent with HEP in order to demonstrate participation in Physical Therapy POC.  Baseline: Goal status: INITIAL   LONG TERM GOALS: Target date:  12/12/2023  Pt will improve 30 Second Chair Stand Test by >4 in order to demonstrate improved functional strength to return to desired activities.  Baseline:  See objective.  Goal status: INITIAL  2.  Pt will improve 2 MWT by 142ft in order to demonstrate improved functional ambulatory capacity in community setting.  Baseline: See objective.  Goal status: INITIAL  3.  Pt will improve Modified Oswestry score by 5 points in order to demonstrate improved pain with functional goals and outcomes. Baseline: See objective.  Goal status: INITIAL  4.  Pt will improve ABC scale by 5 points in order to demonstrate improved functional balance reporting to enhance pt's safety.  Baseline: See objective.  Goal status: INITIAL  PLAN:  PT FREQUENCY: 2x/week  PT DURATION: 6 weeks  PLANNED INTERVENTIONS: 97164- PT Re-evaluation, 97110-Therapeutic exercises, 97530- Therapeutic activity, 97112- Neuromuscular re-education, 97535- Self Care, [id number]- Manual therapy, [id number]- Gait training, 97014- Electrical stimulation (unattended), [id number]- Electrical stimulation (manual), [id number]- Traction (mechanical), Patient/Family education, Balance training, Stair training, Cryotherapy, and Moist heat.  PLAN FOR NEXT SESSION: functional strengthening, narrow balance training, gait training, etc.  .Omega JONETTA Donna ALMETA, DPT [facility] [place] [phone] office   Omega JONETTA Donna, PT 10/31/2023, 10:14 AM

## 2023-10-31 NOTE — Patient Instructions (Signed)

## 2023-11-02 ENCOUNTER — Telehealth: Payer: Self-pay | Admitting: Diagnostic Neuroimaging

## 2023-11-02 ENCOUNTER — Telehealth: Payer: Self-pay

## 2023-11-02 NOTE — Telephone Encounter (Signed)
 Spoke to pt . Pt unaware of the MD who told him he had Brain Cancer Referred pt back to PCP to get more information about this diagnosis . Pt thanked me for calling

## 2023-11-02 NOTE — Telephone Encounter (Signed)
 Copied from CRM [id number]. Topic: General - Other >> Nov 02, 2023  2:40 PM Antwanette L wrote: Reason for CRM: Patient called in to see if he needed an follow up  regarding a brain scan. Patient sister cancelled his appointment with Dr. Eduard. Please contact patient at  [phone]

## 2023-11-02 NOTE — Telephone Encounter (Signed)
 Patient needs to follow up with Dr. Miquel Amen on 02/06/24 no brain scan needed

## 2023-11-02 NOTE — Telephone Encounter (Signed)
 Pt said 2 days ago found out have brain cancer, would like a call from the nurse for a sooner appt or work in with the neurologist.

## 2023-11-03 ENCOUNTER — Ambulatory Visit: Payer: Self-pay | Admitting: Family Medicine

## 2023-11-06 ENCOUNTER — Telehealth: Payer: Self-pay | Admitting: Family Medicine

## 2023-11-06 NOTE — Telephone Encounter (Signed)
 Copied from CRM [id number]. Topic: Referral - Question >> Nov 06, 2023 12:02 PM [name] wrote: Reason for CRM: [facility] calling to see where the patient last brain mri was completed and id there is a report [id number] [name]

## 2023-11-06 NOTE — Telephone Encounter (Signed)
 MA called nurse back twice no answer . Left message for return call.

## 2023-11-06 NOTE — Telephone Encounter (Signed)
 Pt. Informed and contact to Dr. Vikram has been given for contact to that office.

## 2023-11-06 NOTE — Telephone Encounter (Signed)
 Patient called back in and advised that his PCP told him to reschedule the appointment with our office. I placed him on hold while checking with POD 3 regarding r/s the appointment due to this phone note. They stated it was ok to r/s. When I took the patient off hold, he had disconnected the call. Also reached out to patients PCP office to see if dx for referral has changed.

## 2023-11-08 ENCOUNTER — Encounter (HOSPITAL_COMMUNITY): Payer: Self-pay

## 2023-11-08 ENCOUNTER — Ambulatory Visit (HOSPITAL_COMMUNITY): Payer: 59

## 2023-11-08 DIAGNOSIS — Z7409 Other reduced mobility: Secondary | ICD-10-CM | POA: Diagnosis not present

## 2023-11-08 DIAGNOSIS — G8929 Other chronic pain: Secondary | ICD-10-CM | POA: Diagnosis not present

## 2023-11-08 DIAGNOSIS — M5441 Lumbago with sciatica, right side: Secondary | ICD-10-CM | POA: Diagnosis not present

## 2023-11-08 DIAGNOSIS — M5442 Lumbago with sciatica, left side: Secondary | ICD-10-CM | POA: Diagnosis not present

## 2023-11-08 DIAGNOSIS — M6281 Muscle weakness (generalized): Secondary | ICD-10-CM | POA: Diagnosis not present

## 2023-11-08 NOTE — Therapy (Signed)
OUTPATIENT PHYSICAL THERAPY THORACOLUMBAR TREATMENT   Patient Name: RISHAV ROCKEFELLER MRN: [id number] DOB:11-02-1955, 68 y.o., male Today's Date: 11/08/2023  END OF SESSION:  PT End of Session - 11/08/23 1534     Visit Number 2    Number of Visits 12    Date for PT Re-Evaluation 12/12/23    Authorization Type [employer] Dual complete    Authorization Time Period no auth; no limit    Progress Note Due on Visit 10    PT Start Time 1522    PT Stop Time 1555    PT Time Calculation (min) 33 min    Activity Tolerance Patient tolerated treatment well    Behavior During Therapy WFL for tasks assessed/performed              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   Arthritis    CAD (coronary artery disease)    Nonobstructive at cardiac catheterization July 2018 - [facility]   Carpal tunnel syndrome    Depression    Diabetes mellitus without complication (HCC)    Essential hypertension    GERD (gastroesophageal reflux disease)    Hepatitis C    Sleep apnea    Past Surgical History:  Procedure Laterality Date   AORTIC VALVE REPLACEMENT  03/2017   [facility]   BIOPSY  12/15/2022   Procedure: BIOPSY;  Surgeon: Lanelle Bal, DO;  [facility]: [facility];  Service: Endoscopy;;   COLONOSCOPY WITH PROPOFOL N/A 12/15/2022   Procedure: COLONOSCOPY WITH PROPOFOL;  Surgeon: Lanelle Bal, DO;  Location: [facility];  Service: Endoscopy;  Laterality: N/A;  10:00 am, asa 3   ESOPHAGOGASTRODUODENOSCOPY (EGD) WITH PROPOFOL N/A 12/15/2022   Procedure: ESOPHAGOGASTRODUODENOSCOPY (EGD) WITH PROPOFOL;  Surgeon: Lanelle Bal, DO;  Location: [facility];  Service: Endoscopy;  Laterality: N/A;   POLYPECTOMY  12/15/2022   Procedure:  POLYPECTOMY;  Surgeon: Lanelle Bal, DO;  Location: [facility];  Service: Endoscopy;;   Patient Active Problem List   Diagnosis Date Noted   Spinal stenosis in cervical region 10/19/2023   Abnormal urine odor 10/19/2023   Depression, major, single episode, moderate (HCC) 07/12/2023   Hypersomnolence 06/19/2023   Dizziness 06/06/2023   Left anterior fascicular block (LAFB) 06/06/2023   Lumbar pain 04/25/2023   Hypertension 04/25/2023   Current smoker 04/05/2023   Hyponatremia 03/27/2023   Hyperthyroidism 03/27/2023   Erectile dysfunction 02/28/2023   Type 2 diabetes mellitus (HCC) 01/20/2023   Nocturia 01/18/2023   Unexplained weight loss 01/18/2023   Hepatic cirrhosis (HCC) 07/20/2022   Hyperglycemia 07/11/2017   Thrombocytopenia (HCC) 07/11/2017   Chronic back pain 06/26/2017   COPD (chronic obstructive pulmonary disease) (HCC) 04/14/2017   CVA (cerebral vascular accident) (HCC) 04/14/2017   History of alcohol abuse 04/14/2017   History of cocaine abuse (HCC) 04/14/2017   OSA (obstructive sleep apnea) 04/14/2017   History of tobacco abuse 04/14/2017   BPH (benign prostatic hyperplasia) 10/22/2016   GERD (gastroesophageal reflux disease) 10/22/2016   Aortic root dilation (HCC) 10/21/2016   Acute diastolic CHF (congestive heart failure) (HCC) 10/21/2016   Essential hypertension, benign 03/27/2013   Hepatitis C 03/27/2013   Snoring 03/27/2013    PCP: Lonna Cobb Polanco,  iliana, FNP  REFERRING PROVIDER: [name], FNP  REFERRING DIAG: M54.42,M54.41,G89.29 (ICD-10-CM) - Chronic bilateral low back pain with bilateral sciatica  Rationale for Evaluation and Treatment: Rehabilitation  THERAPY DIAG:  Chronic bilateral low back pain with bilateral sciatica  Impaired functional mobility, balance, gait, and endurance  Muscle weakness (generalized)  ONSET DATE: 7 years  SUBJECTIVE:                                                                                                                                                                                            SUBJECTIVE STATEMENT: 11/08/23:  Reports of increased achy stiffness in joint due to the weather.  Reports he has headache and increased dizziness today.    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 Pt had 7 vertebrae broken and surgery. Pt takes gabapentin for LBP and finds relief with this medication. Pt followed by many disciplines: Neurology for spinal stenosis of cervical, and per patient: a 'brain tumor,' and endrinology for overactive thyroid. Fast, repetitive movements overtime will cause increase balance deficits per patient.   Chart reviewed for brain tumor, where no diagnoses has been made but pt continued to report he has brain tumor.   PERTINENT HISTORY:  CAD Essential HTN Hepatitis C Anxiety Depression Overactive Thryoid  PAIN:  Are you having pain?  Currently 0/10. At worse 10/10.Sharp pain that makes pt sit down. Medication, rest ease pain. Once medication runs out, pain increases or cold weather affects pain.    PRECAUTIONS: None  RED FLAGS: None   WEIGHT BEARING RESTRICTIONS: No  FALLS:  Has patient fallen in last 6 months? No   PATIENT GOALS: "balance is my biggest concern"  OBJECTIVE:  Note: Objective measures were completed at Evaluation unless otherwise noted.  DIAGNOSTIC FINDINGS:  01/13/23 MRI brain 1. Small focus of hyperintense signal on diffusion weighted imaging in the periatrial white matter on the left, which could represent an acute infarct. Note if there is clinical concern for intracranial metastatic disease, further evaluation with a contrast-enhanced MRI is recommended. 2. Innumerable small foci of microhemorrhage in the bilateral cerebral and cerebellar hemispheres, which are nonspecific but can be seen in the setting of cerebral amyloid angiopathy. 3. Diffusely  T1 hypointense appearance of the cervical spine vertebral bodies, nonspecific, but marrow infiltrative process is not excluded. There is likely also severe spinal canal stenosis at C3-C4. Recommend further evaluation with a cervical spine MRI.  PATIENT SURVEYS:  ABC scale 1230/1600= 76.9% Modified Oswestry 12/50= 40%   COGNITION: Overall cognitive status: Within functional  limits for tasks assessed     SENSATION: WFL  POSTURE: increased lumbar lordosis  PALPATION: Tender to palpate along lumbar paraspinals  LUMBAR ROM:   AROM eval  Flexion 100  Extension 50% * and painful  Right lateral flexion 50%  Left lateral flexion 50%  Right rotation   Left rotation    (Blank rows = not tested)  LOWER EXTREMITY ROM:     Active  Right eval Left eval  Hip flexion    Hip extension    Hip abduction    Hip adduction    Hip internal rotation    Hip external rotation    Knee flexion    Knee extension    Ankle dorsiflexion    Ankle plantarflexion    Ankle inversion    Ankle eversion     (Blank rows = not tested)  LOWER EXTREMITY MMT:    MMT Right eval Left eval  Hip flexion    Hip extension    Hip abduction    Hip adduction    Hip internal rotation    Hip external rotation    Knee flexion    Knee extension    Ankle dorsiflexion    Ankle plantarflexion    Ankle inversion    Ankle eversion     (Blank rows = not tested)  FUNCTIONAL TESTS:  30 seconds chair stand test: 10x 2 minute walk test: 265ft Single Leg Balance: R 2 seconds  : unable to perform  GAIT: Distance walked: 28ft Assistive device utilized: None Level of assistance: Complete Independence Comments: bilateral trunk sway with bilateral circumduction of BLE during swing phase and foot flat contact throughout entire gait cycle. Waddling like appearance with shoulder adduction during swing phase.   TREATMENT DATE:  11/08/23: BP initially 162/104 mmHg HR at 81 bpm 3 minute deep breathing  BP  146/102 mmHg  Supine: Bridge 2x 10  Gait training to improve heel to toe 40ft  BP at 146/105 mmHg HR at 78  10/31/2023 PT Evaluation                                                                                                                                 PATIENT EDUCATION:  Education details: PT Evaluation, findings, prognosis, frequency, attendance policy, and HEP if given.  Person educated: Patient Education method: Medical illustrator Education comprehension: verbalized understanding  HOME EXERCISE PROGRAM: Access Code: [email or web address] URL: [email or web address] Date: 11/08/2023 Prepared by: Becky Sax  Exercises - Supine Bridge  - 1-2 x daily - 7 x weekly - 3 sets - 10 reps - 5" hold  ASSESSMENT:  CLINICAL IMPRESSION: Pt c/o increased dizziness today, began session checking BP that was high.  Pt stated he doubled the BP medication today, advised to take medication as prescribed and to contact MD concerning. Reviewed goals and educated importance of HEP compliance for maximal benefits.  Session focus on proximal strengthening and improving gait mechanics.  Given bridges as HEP for todays sessoin and educated on heel to toe gait mechanics.  Pt requested to weight himself this session, 162.2#.  Eval:  Patient is a 68 y.o. male who was seen today for physical therapy evaluation and treatment for M54.42,M54.41,G89.29 (ICD-10-CM) - Chronic bilateral low back pain with bilateral sciatica. Pt with multiple system involvement which overlaps with chronic low back pain. Pt has involvement of cervical spine stenosis, pt potentially has brain cancer, per patient, as well is followed by endocrinology and other providers, increasing pt's complexity evaluation due to multiple sources of body system overlapping pt's objective impairments. Pt demonstrating significant limitations in functional mobility, ADLs, transfers and safety due to muscle weakness, balance deficits,  ROM restrictions and chronic pain. Pt will benefit from skilled Physical Therapy services to address deficits/limitations in order to improve functional and QOL.    OBJECTIVE IMPAIRMENTS: Abnormal gait, decreased activity tolerance, decreased balance, decreased mobility, decreased ROM, decreased strength, dizziness, postural dysfunction, and pain.   ACTIVITY LIMITATIONS: carrying, lifting, bending, standing, transfers, and locomotion level  PARTICIPATION LIMITATIONS: community activity and occupation  PERSONAL FACTORS: Age and 3+ comorbidities: see above for details  are also affecting patient's functional outcome.   REHAB POTENTIAL: Fair increased complexity and pending results  CLINICAL DECISION MAKING: Unstable/unpredictable  EVALUATION COMPLEXITY: High   GOALS: Goals reviewed with patient? No  SHORT TERM GOALS: Target date: 11/21/2023  Pt will be independent with HEP in order to demonstrate participation in Physical Therapy POC.  Baseline: Goal status: INITIAL   LONG TERM GOALS: Target date: 12/12/2023  Pt will improve 30 Second Chair Stand Test by >4 in order to demonstrate improved functional strength to return to desired activities.  Baseline:  See objective.  Goal status: INITIAL  2.  Pt will improve 2 MWT by 167ft in order to demonstrate improved functional ambulatory capacity in community setting.  Baseline: See objective.  Goal status: INITIAL  3.  Pt will improve Modified Oswestry score by 5 points in order to demonstrate improved pain with functional goals and outcomes. Baseline: See objective.  Goal status: INITIAL  4.  Pt will improve ABC scale by 5 points in order to demonstrate improved functional balance reporting to enhance pt's safety.  Baseline: See objective.  Goal status: INITIAL  PLAN:  PT FREQUENCY: 2x/week  PT DURATION: 6 weeks  PLANNED INTERVENTIONS: 97164- PT Re-evaluation, 97110-Therapeutic exercises, 97530- Therapeutic activity, 97112-  Neuromuscular re-education, 97535- Self Care, 16109- Manual therapy, [id number]- Gait training, 97014- Electrical stimulation (unattended), [id number]- Electrical stimulation (manual), [id number]- Traction (mechanical), Patient/Family education, Balance training, Stair training, Cryotherapy, and Moist heat.  PLAN FOR NEXT SESSION: functional strengthening, narrow balance training, gait training, etc.   Becky Sax, LPTA/CLT; CBIS [phone]  Juel Burrow, PTA 11/08/2023, 4:19 PM

## 2023-11-10 ENCOUNTER — Encounter (HOSPITAL_COMMUNITY): Payer: 59

## 2023-11-10 ENCOUNTER — Ambulatory Visit (INDEPENDENT_AMBULATORY_CARE_PROVIDER_SITE_OTHER): Payer: 59 | Admitting: Diagnostic Neuroimaging

## 2023-11-10 ENCOUNTER — Encounter: Payer: Self-pay | Admitting: Diagnostic Neuroimaging

## 2023-11-10 VITALS — BP 106/71 | HR 87

## 2023-11-10 DIAGNOSIS — M48062 Spinal stenosis, lumbar region with neurogenic claudication: Secondary | ICD-10-CM

## 2023-11-10 DIAGNOSIS — R9089 Other abnormal findings on diagnostic imaging of central nervous system: Secondary | ICD-10-CM | POA: Diagnosis not present

## 2023-11-10 DIAGNOSIS — M4802 Spinal stenosis, cervical region: Secondary | ICD-10-CM | POA: Diagnosis not present

## 2023-11-10 NOTE — Patient Instructions (Signed)
  GAIT DIFFICULTY (since 2024; worsening; likely due to cervical spine stenosis at C3-4; also history of mod-severe spinal stenosis at L4-5) - cervical spinal stenosis / myelopathy + lumbar spinal stenosis with neurogenic claudication - has seen neurosurgery for consult; reluctant to pursue surgical treatment; follow up with neurosurgery if he changes his mind - use cane / walker; consider PT evaluation and pain mgmt for low back pain  MRI BRAIN FINDINGS - chronic small vessel ischemic disease, microhemorrhages related to hypertension and diabetes - repeat MRI brain follow up study

## 2023-11-10 NOTE — Progress Notes (Signed)
 [facility]  PATIENT: Kirk Oconnor DOB: [birth year]  REFERRING CLINICIAN: Del York Pellant* HISTORY FROM: patient REASON FOR VISIT: new consult   HISTORICAL  CHIEF COMPLAINT:  Chief Complaint  Patient presents with   Follow-up    Patient in room #6 and alone. Patient states he been waking up with headaches and dizziness.. Patient states he could take off his hat and feel pressure relief.    HISTORY OF PRESENT ILLNESS:   UPDATE (11/10/23, VRP): Since last visit, still having gait diff and dizziness. Here for follow up of MRI brain scan.  Has been to neurosurgery for cervical and lumbar spinal stenosis, but he opted to hold off on surgical treatment.  Living alone.  Continues to have severe gait and balance difficulty.  Has some limited support from his sister and brother-in-law.  Also here to follow-up on prior MRI brain abnormalities.  PRIOR HPI (03/03/23, VRP): 68 year old male here for evaluation of dizziness.  Patient has longstanding neck pain, low back pain, intermittent vertigo for many years.  In the past 6 months his balance and gait difficulty has significantly worsened.  He is having short shuffling steps, feels like he is off balance, feels like he could fall down.  He also has some lightheadedness and swimmy headedness sensation in the head, with sitting or standing.  Symptoms tend to be worse when he stands up.  Has some prior vertigo symptoms for many years ago but these have not recurred.  Also has had 40 pound weight loss in the last year and recently diagnosed with hyperthyroidism.  Also prior history of alcohol abuse, diabetes, hypertension.  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  His daughter passed away from fentanyl overdose.  Son was killed during a robbery.  Patient living alone.  He has some support from some family members.   REVIEW OF SYSTEMS: Full 14 system review of  systems performed and negative with exception of: as per HPI.  ALLERGIES: No Known Allergies  HOME MEDICATIONS: Outpatient Medications Prior to Visit  Medication Sig Dispense Refill   acetaminophen (TYLENOL) 500 MG tablet Take 500 mg by mouth every 6 (six) hours as needed.     AMBULATORY NON FORMULARY MEDICATION Vacuum erection device. Dispense 1. 1 each 0   amLODipine (NORVASC) 5 MG tablet Take 1 tablet (5 mg total) by mouth daily. 30 tablet 3   aspirin EC 81 MG tablet Take 81 mg by mouth daily.     gabapentin (NEURONTIN) 300 MG capsule Take 1 capsule (300 mg total) by mouth every 8 (eight) hours as needed. 90 capsule 3   ketoconazole (NIZORAL) 2 % cream Apply 1 Application topically 2 (two) times daily.     ketoconazole (NIZORAL) 2 % shampoo Apply 1 Application topically once.     losartan (COZAAR) 25 MG tablet Take 1 tablet (25 mg total) by mouth daily. 90 tablet 1   metFORMIN (GLUCOPHAGE) 500 MG tablet Take 1 tablet by mouth once daily with breakfast 90 tablet 0   methimazole (TAPAZOLE) 5 MG tablet Take 1 tablet by mouth once daily with breakfast 90 tablet 0   omeprazole (PRILOSEC) 40 MG capsule Take 1 capsule (40 mg total) by mouth 2 (two) times daily. 60 capsule 11   rosuvastatin (CRESTOR) 5 MG tablet Take 5 mg by mouth daily.     sildenafil (REVATIO) 20 MG tablet Take 1 tablet (20 mg total) by mouth daily. 30 tablet  5   sildenafil (VIAGRA) 100 MG tablet Take 1 tablet (100 mg total) by mouth as needed for erectile dysfunction. 30 tablet 5   tiZANidine (ZANAFLEX) 4 MG tablet Take 1 tablet (4 mg total) by mouth every 8 (eight) hours as needed for muscle spasms. 30 tablet 1   Blood Pressure Monitor DEVI 1 each by Does not apply route daily. (Patient not taking: Reported on 11/10/2023) 1 each 0   fluconazole (DIFLUCAN) 150 MG tablet Take two tablets by mouth on day 1. Repeat dose with 2 tablets by mouth on day 8 (1 week after 1st dose). (Patient not taking: Reported on 11/10/2023) 4 tablet 0    Levothyroxine Sodium 125 MCG CAPS Use as directed 125 mcg in the mouth or throat daily. (Patient not taking: Reported on 11/10/2023)     meclizine (ANTIVERT) 25 MG tablet Take 1 tablet (25 mg total) by mouth 2 (two) times daily as needed for dizziness. (Patient not taking: Reported on 11/10/2023) 30 tablet 0   traMADol (ULTRAM) 50 MG tablet Take 1 tablet (50 mg total) by mouth daily as needed. 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   Arthritis    CAD (coronary artery disease)    Nonobstructive at cardiac catheterization July 2018 - [facility]   Carpal tunnel syndrome    Depression    Diabetes mellitus without complication (HCC)    Essential hypertension    GERD (gastroesophageal reflux disease)    Hepatitis C    Sleep apnea     PAST SURGICAL HISTORY: Past Surgical History:  Procedure Laterality Date   AORTIC VALVE REPLACEMENT  03/2017   [facility]   BIOPSY  12/15/2022   Procedure: BIOPSY;  Surgeon: Lanelle Bal, DO;  Location: [facility];  Service: Endoscopy;;   COLONOSCOPY WITH PROPOFOL N/A 12/15/2022   Procedure: COLONOSCOPY WITH PROPOFOL;  Surgeon: Lanelle Bal, DO;  [facility]: [facility];  Service: Endoscopy;  Laterality: N/A;  10:00 am, asa 3   ESOPHAGOGASTRODUODENOSCOPY (EGD) WITH PROPOFOL N/A 12/15/2022   Procedure: ESOPHAGOGASTRODUODENOSCOPY (EGD) WITH PROPOFOL;  Surgeon: Lanelle Bal, DO;  Location: [facility];  Service:  Endoscopy;  Laterality: N/A;   POLYPECTOMY  12/15/2022   Procedure: POLYPECTOMY;  Surgeon: Lanelle Bal, DO;  Location: [facility];  Service: Endoscopy;;    FAMILY HISTORY: Family History  Problem Relation Age of Onset   Diabetes Mother    Heart attack Mother    Heart attack Father     SOCIAL HISTORY: Social History   Socioeconomic History   Marital status: Divorced    Spouse name: Not on file   Number of children: Not on file   Years of education: Not on file   Highest education level: Not on file  Occupational History   Not on file  Tobacco Use   Smoking status: Some Days    Types: Cigarettes   Smokeless tobacco: Never  Vaping Use  Vaping status: Never Used  Substance and Sexual Activity   Alcohol use: Yes    Comment: socially   Drug use: No   Sexual activity: Yes  Other Topics Concern   Not on file  Social History Narrative   Not on file   Social Drivers of Health   Financial Resource Strain: Low Risk  (08/22/2023)   Overall Financial Resource Strain (CARDIA)    Difficulty of Paying Living Expenses: Not very hard  Food Insecurity: No Food Insecurity (08/18/2023)   Hunger Vital Sign    Worried About Running Out of Food in the Last Year: Never true    Ran Out of Food in the Last Year: Never true  Transportation Needs: No Transportation Needs (08/18/2023)   PRAPARE - Administrator, Civil Service (Medical): No    Lack of Transportation (Non-Medical): No  Physical Activity: Not on file  Stress: Not on file  Social Connections: Not on file  Intimate Partner Violence: Not At Risk (08/18/2023)   Humiliation, Afraid, Rape, and Kick questionnaire    Fear of Current or Ex-Partner: No    Emotionally Abused: No    Physically Abused: No    Sexually Abused: No     PHYSICAL EXAM  GENERAL EXAM/CONSTITUTIONAL: Vitals:  Vitals:   11/10/23 0922  BP: 106/71  Pulse: 87   There is no height or weight on file to calculate BMI. Wt Readings from  Last 3 Encounters:  10/19/23 162 lb 1.9 oz (73.5 kg)  10/05/23 158 lb 3.2 oz (71.8 kg)  08/31/23 159 lb (72.1 kg)   Patient is in no distress; well developed, nourished and groomed; neck is supple  CARDIOVASCULAR: Examination of carotid arteries is normal; no carotid bruits Regular rate and rhythm, no murmurs Examination of peripheral vascular system by observation and palpation is normal  EYES: Ophthalmoscopic exam of optic discs and posterior segments is normal; no papilledema or hemorrhages No results found.  MUSCULOSKELETAL: Gait, strength, tone, movements noted in Neurologic exam below  NEUROLOGIC: MENTAL STATUS:      No data to display         awake, alert, oriented to person, place and time recent and remote memory intact normal attention and concentration language fluent, comprehension intact, naming intact fund of knowledge appropriate  CRANIAL NERVE:  2nd - no papilledema on fundoscopic exam 2nd, 3rd, 4th, 6th - pupils equal and reactive to light, visual fields full to confrontation, extraocular muscles intact, no nystagmus 5th - facial sensation symmetric 7th - facial strength symmetric 8th - hearing intact 9th - palate elevates symmetrically, uvula midline 11th - shoulder shrug symmetric 12th - tongue protrusion midline  MOTOR:  normal bulk; INCREASE TONE IN BLE BUE 3-4 BLE 3-4   SENSORY:  normal and symmetric to light touch, temperature, vibration  COORDINATION:  finger-nose-finger, fine finger movements normal  REFLEXES:  deep tendon reflexes --> BUE 1+; KNEES 2+; ANKLES TRACE; POSITIVE SUPRAPATELLAR REFLEXES (L > R); POSITIVE HOFFMANS  GAIT/STATION:  narrow based gait; SHORT STEPS; VERY UNSTEADY     DIAGNOSTIC DATA (LABS, IMAGING, TESTING) - I reviewed patient records, labs, notes, testing and imaging myself where available.  Lab Results  Component Value Date   WBC 7.0 06/19/2023   HGB 14.7 06/19/2023   HCT 44.5 06/19/2023   MCV  89 06/19/2023   PLT 196 06/19/2023      Component Value Date/Time   NA 132 (L) 09/29/2023 1145   NA 133 (L) 08/31/2023 1205   K  4.2 09/29/2023 1145   CL 102 09/29/2023 1145   CO2 24 09/29/2023 1145   GLUCOSE 107 (H) 09/29/2023 1145   BUN 15 09/29/2023 1145   BUN 17 08/31/2023 1205   CREATININE 0.80 09/29/2023 1145   CREATININE 0.98 03/11/2013 1036   CALCIUM 9.4 09/29/2023 1145   PROT 7.5 09/29/2023 1145   PROT 6.8 08/31/2023 1205   ALBUMIN 3.8 09/29/2023 1145   ALBUMIN 4.2 08/31/2023 1205   AST 25 09/29/2023 1145   ALT 38 09/29/2023 1145   ALKPHOS 71 09/29/2023 1145   BILITOT 0.6 09/29/2023 1145   BILITOT 0.5 08/31/2023 1205   GFRNONAA >60 09/29/2023 1145   GFRNONAA 87 03/11/2013 1036   GFRAA >60 07/11/2017 2043   GFRAA >89 03/11/2013 1036   Lab Results  Component Value Date   CHOL 146 07/12/2023   HDL 37 (L) 07/12/2023   LDLCALC 88 07/12/2023   TRIG 112 07/12/2023   CHOLHDL 3.9 07/12/2023   Lab Results  Component Value Date   HGBA1C 6.6 (H) 10/19/2023   Lab Results  Component Value Date   VITAMINB12 317 10/19/2023   Lab Results  Component Value Date   TSH 0.022 (L) 09/29/2023    11/01/10 MRI lumbar spine 1.  Moderate multifactorial spinal and bilateral lateral recess  stenosis at L4-5.  Mild foraminal stenosis bilaterally also, left  greater than right.  2.  Mild multifactorial spinal, lateral recess and foraminal  stenosis at L3-4.  3.  Mild spinal and bilateral lateral recess stenosis at L2-3.  Bilateral foraminal encroachment also.  4.  Small left paracentral disc protrusion at L5-S1.   01/13/23 MRI brain [I reviewed images myself and agree with interpretation. Left peri-atrial DWI lesion likely incidental finding related to hypertension. -VRP]  1. Small focus of hyperintense signal on diffusion weighted imaging in the periatrial white matter on the left, which could represent an acute infarct. Note if there is clinical concern for  intracranial metastatic disease, further evaluation with a contrast-enhanced MRI is recommended. 2. Innumerable small foci of microhemorrhage in the bilateral cerebral and cerebellar hemispheres, which are nonspecific but can be seen in the setting of cerebral amyloid angiopathy. 3. Diffusely T1 hypointense appearance of the cervical spine vertebral bodies, nonspecific, but marrow infiltrative process is not excluded. There is likely also severe spinal canal stenosis at C3-C4. Recommend further evaluation with a cervical spine MRI.   ASSESSMENT AND PLAN  68 y.o. year old male here with:   Dx:  1. MRI of brain abnormal   2. Cervical spinal stenosis   3. Spinal stenosis of lumbar region with neurogenic claudication     PLAN:  GAIT DIFFICULTY (since 2024; worsening; likely due to cervical spine stenosis at C3-4; also history of mod-severe spinal stenosis at L4-5) - cervical spinal stenosis / myelopathy + lumbar spinal stenosis with neurogenic claudication - has seen neurosurgery for consult; reluctant to pursue surgical treatment; follow up with neurosurgery if he changes his mind - use cane / walker; consider PT evaluation and pain mgmt for low back pain  MRI BRAIN FINDINGS - chronic small vessel ischemic disease, microhemorrhages related to hypertension and diabetes; small DWI finding on last MRI, likely incidental small vessel infarct, rather than metastatic disease - repeat MRI brain follow up study  ORTHOSTATIC LIGHTHEADEDNESS / DIZZINESS - could be related to diabetic neuropathy, alcoholic neuropathy or volume depletion; also recently diagnosed with hyperthyroidism - monitor BP; stay hydrated; optimize nutrition / exercise  WEIGHT LOSS / HYPERTHYROIDISM - follow up  with endocrinology  INTERMITTENT POSITIONAL VERTIGO - no current symptoms; monitor  Orders Placed This Encounter  Procedures   MR BRAIN W WO CONTRAST   Return for pending test results, pending if  symptoms worsen or fail to improve.  I spent 49 minutes of face-to-face and non-face-to-face time with patient.  This included previsit chart review, lab review, study review, order entry, electronic health record documentation, patient education.      Suanne Marker, MD 11/10/2023, 10:32 AM Certified in Neurology, Neurophysiology and Neuroimaging  [facility] [address], [address] [place], [place] [address] [phone]

## 2023-11-13 ENCOUNTER — Ambulatory Visit (HOSPITAL_COMMUNITY): Payer: 59 | Admitting: Physical Therapy

## 2023-11-13 DIAGNOSIS — M5441 Lumbago with sciatica, right side: Secondary | ICD-10-CM | POA: Diagnosis not present

## 2023-11-13 DIAGNOSIS — Z7409 Other reduced mobility: Secondary | ICD-10-CM | POA: Diagnosis not present

## 2023-11-13 DIAGNOSIS — G8929 Other chronic pain: Secondary | ICD-10-CM

## 2023-11-13 DIAGNOSIS — M5442 Lumbago with sciatica, left side: Secondary | ICD-10-CM | POA: Diagnosis not present

## 2023-11-13 DIAGNOSIS — M6281 Muscle weakness (generalized): Secondary | ICD-10-CM

## 2023-11-13 NOTE — Patient Instructions (Signed)

## 2023-11-13 NOTE — Progress Notes (Unsigned)
   Established Patient Office Visit   Subjective  Patient ID: Kirk Oconnor, male    DOB: February 28, 1956  Age: 68 y.o. MRN: [id number]  No chief complaint on file.   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  HPI  ROS    Objective:     There were no vitals taken for this visit. {Vitals History (Optional):23777}  Physical Exam   No results found for any visits on 11/15/23.  The ASCVD Risk score (Arnett DK, et al., 2019) failed to calculate for the following reasons:   Risk score cannot be calculated because patient has a medical history suggesting prior/existing ASCVD    Assessment & Plan:  There are no diagnoses linked to this encounter.  No follow-ups on file.   Cruzita Lederer Newman Nip, FNP

## 2023-11-13 NOTE — Therapy (Signed)
 OUTPATIENT PHYSICAL THERAPY THORACOLUMBAR TREATMENT   Patient Name: Kirk Oconnor MRN: [id number] DOB:04/23/1956, 68 y.o., male Today's Date: 11/13/2023  END OF SESSION:  PT End of Session - 11/13/23 1522     Visit Number 3    Number of Visits 12    Date for PT Re-Evaluation 12/12/23    Authorization Type [employer] Dual complete    Authorization Time Period no auth; no limit    Progress Note Due on Visit 10    PT Start Time 1519    PT Stop Time 1559    PT Time Calculation (min) 40 min    Activity Tolerance Patient tolerated treatment well    Behavior During Therapy WFL for tasks assessed/performed              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   Arthritis    CAD (coronary artery disease)    Nonobstructive at cardiac catheterization July 2018 - [facility]   Carpal tunnel syndrome    Depression    Diabetes mellitus without complication (HCC)    Essential hypertension    GERD (gastroesophageal reflux disease)    Hepatitis C    Sleep apnea    Past Surgical History:  Procedure Laterality Date   AORTIC VALVE REPLACEMENT  03/2017   [facility]   BIOPSY  12/15/2022   Procedure: BIOPSY;  Surgeon: Lanelle Bal, DO;  [facility]: [facility];  Service: Endoscopy;;   COLONOSCOPY WITH PROPOFOL N/A 12/15/2022   Procedure: COLONOSCOPY WITH PROPOFOL;  Surgeon: Lanelle Bal, DO;  [facility]: [facility];  Service: Endoscopy;  Laterality: N/A;  10:00 am, asa 3   ESOPHAGOGASTRODUODENOSCOPY (EGD) WITH PROPOFOL N/A 12/15/2022   Procedure: ESOPHAGOGASTRODUODENOSCOPY (EGD) WITH PROPOFOL;  Surgeon: Lanelle Bal, DO;  [facility];  Service: Endoscopy;  Laterality: N/A;   POLYPECTOMY  12/15/2022   Procedure:  POLYPECTOMY;  Surgeon: Lanelle Bal, DO;  [facility]: [facility];  Service: Endoscopy;;   Patient Active Problem List   Diagnosis Date Noted   Spinal stenosis in cervical region 10/19/2023   Abnormal urine odor 10/19/2023   Depression, major, single episode, moderate (HCC) 07/12/2023   Hypersomnolence 06/19/2023   Dizziness 06/06/2023   Left anterior fascicular block (LAFB) 06/06/2023   Lumbar pain 04/25/2023   Hypertension 04/25/2023   Current smoker 04/05/2023   Hyponatremia 03/27/2023   Hyperthyroidism 03/27/2023   Erectile dysfunction 02/28/2023   Type 2 diabetes mellitus (HCC) 01/20/2023   Nocturia 01/18/2023   Unexplained weight loss 01/18/2023   Hepatic cirrhosis (HCC) 07/20/2022   Hyperglycemia 07/11/2017   Thrombocytopenia (HCC) 07/11/2017   Chronic back pain 06/26/2017   COPD (chronic obstructive pulmonary disease) (HCC) 04/14/2017   CVA (cerebral vascular accident) (HCC) 04/14/2017   History of alcohol abuse 04/14/2017   History of cocaine abuse (HCC) 04/14/2017   OSA (obstructive sleep apnea) 04/14/2017   History of tobacco abuse 04/14/2017   BPH (benign prostatic hyperplasia) 10/22/2016   GERD (gastroesophageal reflux disease) 10/22/2016   Aortic root dilation (HCC) 10/21/2016   Acute diastolic CHF (congestive heart failure) (HCC) 10/21/2016   Essential hypertension, benign 03/27/2013   Hepatitis C 03/27/2013   Snoring 03/27/2013    PCP: Lonna Cobb Polanco,  iliana, FNP  REFERRING PROVIDER: [name], FNP  REFERRING DIAG: M54.42,M54.41,G89.29 (ICD-10-CM) - Chronic bilateral low back pain with bilateral sciatica  Rationale for Evaluation and Treatment: Rehabilitation  THERAPY DIAG:  Chronic bilateral low back pain with bilateral sciatica  Impaired functional mobility, balance, gait, and endurance  Muscle weakness (generalized)  ONSET DATE: 7 years  SUBJECTIVE:                                                                                                                                                                                            SUBJECTIVE STATEMENT: 11/08/23:  Reports of increased achy stiffness in joint due to the weather.  Reports he has headache and increased dizziness today.    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 Pt had 7 vertebrae broken and surgery. Pt takes gabapentin for LBP and finds relief with this medication. Pt followed by many disciplines: Neurology for spinal stenosis of cervical, and per patient: a 'brain tumor,' and endrinology for overactive thyroid. Fast, repetitive movements overtime will cause increase balance deficits per patient.   Chart reviewed for brain tumor, where no diagnoses has been made but pt continued to report he has brain tumor.   PERTINENT HISTORY:  CAD Essential HTN Hepatitis C Anxiety Depression Overactive Thryoid  PAIN:  Are you having pain?  Currently 0/10. At worse 10/10.Sharp pain that makes pt sit down. Medication, rest ease pain. Once medication runs out, pain increases or cold weather affects pain.    PRECAUTIONS: None  RED FLAGS: None   WEIGHT BEARING RESTRICTIONS: No  FALLS:  Has patient fallen in last 6 months? No   PATIENT GOALS: "balance is my biggest concern"  OBJECTIVE:  Note: Objective measures were completed at Evaluation unless otherwise noted.  DIAGNOSTIC FINDINGS:  01/13/23 MRI brain 1. Small focus of hyperintense signal on diffusion weighted imaging in the periatrial white matter on the left, which could represent an acute infarct. Note if there is clinical concern for intracranial metastatic disease, further evaluation with a contrast-enhanced MRI is recommended. 2. Innumerable small foci of microhemorrhage in the bilateral cerebral and cerebellar hemispheres, which are nonspecific but can be seen in the setting of cerebral amyloid angiopathy. 3. Diffusely  T1 hypointense appearance of the cervical spine vertebral bodies, nonspecific, but marrow infiltrative process is not excluded. There is likely also severe spinal canal stenosis at C3-C4. Recommend further evaluation with a cervical spine MRI.  PATIENT SURVEYS:  ABC scale 1230/1600= 76.9% Modified Oswestry 12/50= 40%   COGNITION: Overall cognitive status: Within functional  limits for tasks assessed     SENSATION: WFL  POSTURE: increased lumbar lordosis  PALPATION: Tender to palpate along lumbar paraspinals  LUMBAR ROM:   AROM eval  Flexion 100  Extension 50% * and painful  Right lateral flexion 50%  Left lateral flexion 50%  Right rotation   Left rotation    (Blank rows = not tested)  LOWER EXTREMITY ROM:     Active  Right eval Left eval  Hip flexion    Hip extension    Hip abduction    Hip adduction    Hip internal rotation    Hip external rotation    Knee flexion    Knee extension    Ankle dorsiflexion    Ankle plantarflexion    Ankle inversion    Ankle eversion     (Blank rows = not tested)  LOWER EXTREMITY MMT:    MMT Right eval Left eval  Hip flexion    Hip extension    Hip abduction    Hip adduction    Hip internal rotation    Hip external rotation    Knee flexion    Knee extension    Ankle dorsiflexion    Ankle plantarflexion    Ankle inversion    Ankle eversion     (Blank rows = not tested)  FUNCTIONAL TESTS:  30 seconds chair stand test: 10x 2 minute walk test: 250ft Single Leg Balance: R 2 seconds  : unable to perform  GAIT: Distance walked: 270ft Assistive device utilized: None Level of assistance: Complete Independence Comments: bilateral trunk sway with bilateral circumduction of BLE during swing phase and foot flat contact throughout entire gait cycle. Waddling like appearance with shoulder adduction during swing phase.   TREATMENT DATE:  11/13/23 BP at beginning of session 126/94 mmHg HR 78 bpm Seated: sit to stands no  UE assist 10X  Scapular retractions 10X5"  Postural education ` Leg extended to floor stretch 3X20" each (too tight long sitting) Supine: Bridge 10X  SLR 10X each  SKTC 3X20" with UE assist  LTR 10X5" Nustep level 7 seat 10 UE/LE 5 minutes ATL beach  11/08/23: BP initially 162/104 mmHg HR at 81 bpm 3 minute deep breathing  BP 146/102 mmHg  Supine: Bridge 2x 10  Gait training to improve heel to toe 73ft  BP at 146/105 mmHg HR at 78  10/31/2023 PT Evaluation                                                                                                                                 PATIENT EDUCATION:  Education details: PT Evaluation, findings, prognosis, frequency, attendance policy, and HEP if given.  Person educated: Patient Education method: Medical illustrator Education comprehension: verbalized understanding  HOME EXERCISE PROGRAM: Access Code: [email or web address] URL: [email or web address] Date: 11/08/2023 Prepared by: Becky Sax  Exercises - Supine Bridge  - 1-2 x daily - 7 x weekly - 3 sets -  10 reps - 5" hold  ASSESSMENT:  CLINICAL IMPRESSION: Pt returns today no dizziness since last time at therapy and feeling a little better other than weakness.  BP checked at 126/94 mmHg, heartrate 78 bpm.  Began with seated sit to stands then educated on seated posture.  PT very tight with difficulty sitting erect maintaining forward bent posturing, leaning back at attempts to sit more upright.  Pt's hamstrings extremely tight with inability to sit up with attempts of assuming long sitting position.  Added hamstring stretch with LE extended to floor.  Began scapular retraction with immediate cervical flexion due to tightness/immobility.  Pt will continue to benefit from skilled therapy.,  Eval:  Patient is a 68 y.o. male who was seen today for physical therapy evaluation and treatment for M54.42,M54.41,G89.29 (ICD-10-CM) - Chronic bilateral low back pain with  bilateral sciatica. Pt with multiple system involvement which overlaps with chronic low back pain. Pt has involvement of cervical spine stenosis, pt potentially has brain cancer, per patient, as well is followed by endocrinology and other providers, increasing pt's complexity evaluation due to multiple sources of body system overlapping pt's objective impairments. Pt demonstrating significant limitations in functional mobility, ADLs, transfers and safety due to muscle weakness, balance deficits, ROM restrictions and chronic pain. Pt will benefit from skilled Physical Therapy services to address deficits/limitations in order to improve functional and QOL.    OBJECTIVE IMPAIRMENTS: Abnormal gait, decreased activity tolerance, decreased balance, decreased mobility, decreased ROM, decreased strength, dizziness, postural dysfunction, and pain.   ACTIVITY LIMITATIONS: carrying, lifting, bending, standing, transfers, and locomotion level  PARTICIPATION LIMITATIONS: community activity and occupation  PERSONAL FACTORS: Age and 3+ comorbidities: see above for details  are also affecting patient's functional outcome.   REHAB POTENTIAL: Fair increased complexity and pending results  CLINICAL DECISION MAKING: Unstable/unpredictable  EVALUATION COMPLEXITY: High   GOALS: Goals reviewed with patient? No  SHORT TERM GOALS: Target date: 11/21/2023  Pt will be independent with HEP in order to demonstrate participation in Physical Therapy POC.  Baseline: Goal status: INITIAL   LONG TERM GOALS: Target date: 12/12/2023  Pt will improve 30 Second Chair Stand Test by >4 in order to demonstrate improved functional strength to return to desired activities.  Baseline:  See objective.  Goal status: INITIAL  2.  Pt will improve 2 MWT by 19ft in order to demonstrate improved functional ambulatory capacity in community setting.  Baseline: See objective.  Goal status: INITIAL  3.  Pt will improve Modified  Oswestry score by 5 points in order to demonstrate improved pain with functional goals and outcomes. Baseline: See objective.  Goal status: INITIAL  4.  Pt will improve ABC scale by 5 points in order to demonstrate improved functional balance reporting to enhance pt's safety.  Baseline: See objective.  Goal status: INITIAL  PLAN:  PT FREQUENCY: 2x/week  PT DURATION: 6 weeks  PLANNED INTERVENTIONS: 97164- PT Re-evaluation, 97110-Therapeutic exercises, 97530- Therapeutic activity, 97112- Neuromuscular re-education, 97535- Self Care, 78295- Manual therapy, [id number]- Gait training, 97014- Electrical stimulation (unattended), [id number]- Electrical stimulation (manual), [id number]- Traction (mechanical), Patient/Family education, Balance training, Stair training, Cryotherapy, and Moist heat.  PLAN FOR NEXT SESSION: functional strengthening, narrow balance training, gait training, etc.   Stretches to increase spinal and LE mobility. Postural education.   Lurena Nida, PTA/CLT [facility] Ph: [phone]  Lurena Nida, PTA 11/13/2023, 3:23 PM

## 2023-11-15 ENCOUNTER — Telehealth: Payer: Self-pay

## 2023-11-15 ENCOUNTER — Ambulatory Visit (INDEPENDENT_AMBULATORY_CARE_PROVIDER_SITE_OTHER): Payer: 59 | Admitting: Family Medicine

## 2023-11-15 ENCOUNTER — Encounter: Payer: Self-pay | Admitting: Family Medicine

## 2023-11-15 ENCOUNTER — Ambulatory Visit: Payer: Medicaid Other | Admitting: Family Medicine

## 2023-11-15 VITALS — BP 140/97 | HR 81 | Ht 66.0 in | Wt 164.0 lb

## 2023-11-15 DIAGNOSIS — M5442 Lumbago with sciatica, left side: Secondary | ICD-10-CM | POA: Diagnosis not present

## 2023-11-15 DIAGNOSIS — G8929 Other chronic pain: Secondary | ICD-10-CM

## 2023-11-15 DIAGNOSIS — M5441 Lumbago with sciatica, right side: Secondary | ICD-10-CM

## 2023-11-15 DIAGNOSIS — I10 Essential (primary) hypertension: Secondary | ICD-10-CM | POA: Diagnosis not present

## 2023-11-15 MED ORDER — BLOOD PRESSURE MONITOR DEVI: 1.0000 | Freq: Every day | 0 refills | Status: AC

## 2023-11-15 MED ORDER — METHYLPREDNISOLONE ACETATE 40 MG/ML IJ SUSP: 40.0000 mg | Freq: Once | INTRAMUSCULAR | Status: DC

## 2023-11-15 MED ORDER — AMLODIPINE BESYLATE 10 MG PO TABS: 10.0000 mg | ORAL_TABLET | Freq: Every day | ORAL | 3 refills | Status: DC

## 2023-11-15 MED ORDER — METHYLPREDNISOLONE ACETATE 80 MG/ML IJ SUSP
40.0000 mg | Freq: Once | INTRAMUSCULAR | Status: AC
  Administered 2023-11-15: 40 mg via INTRAMUSCULAR

## 2023-11-15 MED ORDER — BLOOD PRESSURE KIT DEVI: 0 refills | Status: AC

## 2023-11-15 NOTE — Assessment & Plan Note (Signed)
 Vitals:   11/15/23 1028 11/15/23 1143  BP: (!) 131/90 (!) 140/97   Blood pressure at home still elevated Increased Amlodipine dose to 10 mg daily, continue Losartan 25 mg once daily Follow up in 4 weeks with at home blood pressure medication to monitor trends Continued discussion on DASH diet, low sodium diet and maintain a exercise routine for 150 minutes per week.

## 2023-11-15 NOTE — Progress Notes (Signed)
 Established Patient Office Visit   Subjective  Patient ID: Kirk Oconnor, male    DOB: 1956-08-13  Age: 68 y.o. MRN: [id number]  Chief Complaint  Patient presents with   Follow-up    4 month f/u hypertension, type 2 diabetes Concerns about B/P , A1C, Thyroid  Would like list on how to take medication     He  has a past medical history of Anxiety, Aortic regurgitation (03/2017), Aortic root aneurysm (03/2017), Arthritis, CAD (coronary artery disease), Carpal tunnel syndrome, Depression, Diabetes mellitus without complication (HCC), Essential hypertension, GERD (gastroesophageal reflux disease), Hepatitis C, and Sleep apnea.  HPI Patient presents to the clinic for hypertension follow up. For the details of today's visit, please refer to assessment and plan.   Review of Systems  Constitutional:  Negative for chills and fever.  Eyes:  Negative for blurred vision.  Respiratory:  Negative for shortness of breath.   Cardiovascular:  Negative for palpitations and leg swelling.  Musculoskeletal:  Positive for back pain and myalgias.      Objective:     BP (!) 140/97   Pulse 81   Ht 5\' 6"  (1.676 m)   Wt 164 lb (74.4 kg)   SpO2 97%   BMI 26.47 kg/m  BP Readings from Last 3 Encounters:  11/15/23 (!) 140/97  11/10/23 106/71  10/27/23 (!) 153/107      Physical Exam Vitals reviewed.  Constitutional:      General: He is not in acute distress.    Appearance: Normal appearance. He is not ill-appearing, toxic-appearing or diaphoretic.  HENT:     Head: Normocephalic.  Eyes:     General:        Right eye: No discharge.        Left eye: No discharge.     Conjunctiva/sclera: Conjunctivae normal.  Cardiovascular:     Rate and Rhythm: Normal rate.     Pulses: Normal pulses.     Heart sounds: Normal heart sounds.  Pulmonary:     Effort: Pulmonary effort is normal. No respiratory distress.     Breath sounds: Normal breath sounds.  Musculoskeletal:        General: Normal range  of motion.     Cervical back: Normal range of motion.  Skin:    General: Skin is warm and dry.     Capillary Refill: Capillary refill takes less than 2 seconds.  Neurological:     Mental Status: He is alert.  Psychiatric:        Mood and Affect: Mood normal.        Behavior: Behavior normal.      No results found for any visits on 11/15/23.  The ASCVD Risk score (Arnett DK, et al., 2019) failed to calculate for the following reasons:   Risk score cannot be calculated because patient has a medical history suggesting prior/existing ASCVD    Assessment & Plan:  Primary hypertension -     methylPREDNISolone Acetate  Chronic bilateral low back pain with bilateral sciatica Assessment & Plan: Depro-Medrol 40 mg IM injection given today  Can take Gabapentin 300 mg PRN Continue with physical therapy and follow up with Neurosurgery  Discussed to Maintain good posture, stay active with gentle stretching and strengthening exercises, and apply heat or ice to reduce pain and inflammation. Avoid prolonged sitting, lift objects properly.  Orders: -     methylPREDNISolone Acetate  Essential hypertension, benign Assessment & Plan: Vitals:   11/15/23 1028 11/15/23 1143  BP: Marland Kitchen)  131/90 (!) 140/97   Blood pressure at home still elevated Increased Amlodipine dose to 10 mg daily, continue Losartan 25 mg once daily Follow up in 4 weeks with at home blood pressure medication to monitor trends Continued discussion on DASH diet, low sodium diet and maintain a exercise routine for 150 minutes per week.    Other orders -     amLODIPine Besylate; Take 1 tablet (10 mg total) by mouth daily.  Dispense: 30 tablet; Refill: 3 -     Blood Pressure Kit; Take blood pressure reading once daily  Dispense: 1 each; Refill: 0 -     Blood Pressure Monitor; 1 each by Does not apply route daily.  Dispense: 1 each; Refill: 0    Return in about 4 weeks (around 12/13/2023), or if symptoms worsen or fail to  improve, for re-check blood pressure, hypertension.   Cruzita Lederer Newman Nip, FNP

## 2023-11-15 NOTE — Assessment & Plan Note (Signed)
 Depro-Medrol 40 mg IM injection given today  Can take Gabapentin 300 mg PRN Continue with physical therapy and follow up with Neurosurgery  Discussed to Maintain good posture, stay active with gentle stretching and strengthening exercises, and apply heat or ice to reduce pain and inflammation. Avoid prolonged sitting, lift objects properly.

## 2023-11-15 NOTE — Patient Instructions (Signed)

## 2023-11-15 NOTE — Addendum Note (Signed)
 Addended by: Ricardo Jericho R on: 11/15/2023 11:55 AM   Modules accepted: Orders

## 2023-11-15 NOTE — Telephone Encounter (Signed)
 Your pain and numbness are likely related to diabetes, which can cause nerve damage (diabetic neuropathy). A good option for managing this pain is Gabapentin, which helps calm nerve-related discomfort.  If your symptoms worsen or continue to interfere with daily activities, I recommend reaching out to your neurologist for further evaluation and additional treatment options.

## 2023-11-16 ENCOUNTER — Encounter (HOSPITAL_COMMUNITY): Payer: 59

## 2023-11-16 NOTE — Telephone Encounter (Signed)
 My chart message sent. After phone call attempts failed.

## 2023-11-20 ENCOUNTER — Telehealth: Payer: Self-pay | Admitting: Diagnostic Neuroimaging

## 2023-11-20 NOTE — Telephone Encounter (Signed)
[employer] medicare/medicaid NPR sent to GI [phone] 

## 2023-11-21 ENCOUNTER — Encounter (HOSPITAL_COMMUNITY): Payer: 59

## 2023-11-21 ENCOUNTER — Institutional Professional Consult (permissible substitution): Payer: 59 | Admitting: Diagnostic Neuroimaging

## 2023-11-23 ENCOUNTER — Encounter (HOSPITAL_COMMUNITY): Payer: 59

## 2023-11-27 ENCOUNTER — Other Ambulatory Visit: Payer: Self-pay | Admitting: *Deleted

## 2023-11-27 NOTE — Patient Outreach (Signed)
 Medicaid Managed Care   Nurse Care Manager Note  11/27/2023 Name:  Kirk Oconnor MRN:  [id number] DOB:  1956-04-20  Kirk Oconnor is an 68 y.o. year old male who is a primary patient of Kirk Nigel Berthold, FNP.  The [facility] Managed Care Coordination team was consulted for assistance with:    HTN  Kirk Oconnor was given information about Medicaid Managed Care Coordination team services today. Kirk Oconnor Patient agreed to services and verbal consent obtained.  Engaged with patient by telephone for follow up visit in response to provider referral for case management and/or care coordination services.   Patient is participating in a Managed Medicaid Plan:  Yes  Assessments/Interventions:  Review of past medical history, allergies, medications, health status, including review of consultants reports, laboratory and other test data, was performed as part of comprehensive evaluation and provision of chronic care management services.  SDOH (Social Drivers of Health) assessments and interventions performed: SDOH Interventions    Flowsheet Row Patient Outreach Telephone from 11/27/2023 in [place] POPULATION HEALTH DEPARTMENT Patient Outreach Telephone from 08/18/2023 in [place] POPULATION HEALTH DEPARTMENT Office Visit from 07/12/2023 in [facility] [place] [facility] Patient Outreach Telephone from 03/21/2023 in [place] POPULATION HEALTH DEPARTMENT Office Visit from 03/01/2023 in [facility] [place] Office Visit from 01/20/2023 in [facility]  SDOH Interventions        Food Insecurity Interventions Intervention Not Indicated Intervention Not Indicated -- Intervention Not Indicated -- --  Housing Interventions Intervention Not Indicated Other (Comment)  [BSW referral] -- Other (Comment)  [Patient is scheduled with BSW on 03/23/23] AMB Referral --  Transportation Interventions Intervention Not Indicated Intervention Not Indicated -- Intervention Not  Indicated -- --  Utilities Interventions Intervention Not Indicated Intervention Not Indicated -- Intervention Not Indicated -- --  Depression Interventions/Treatment  -- -- Medication, Counseling -- -- Counseling       Care Plan  No Known Allergies  Medications Reviewed Today     Reviewed by Heidi Dach, RN (Registered Nurse) on 11/27/23 at 1406  Med List Status: <None>   Medication Order Taking? Sig Documenting Provider Last Dose Status Informant  acetaminophen (TYLENOL) 500 MG tablet [id number] Yes Take 500 mg by mouth every 6 (six) hours as needed. [provider] Taking Active   AMBULATORY NON FORMULARY MEDICATION [id number]  Vacuum erection device. Dispense 1. Donnita Falls, FNP  Active   amLODipine (NORVASC) 10 MG tablet [id number] Yes Take 1 tablet (10 mg total) by mouth daily. Kirk Newman Nip, Tenna Child, FNP Taking Active   aspirin EC 81 MG tablet [id number] Yes Take 81 mg by mouth daily. [provider] Taking Active Family Member  Blood Pressure Monitor DEVI [id number] Yes 1 each by Does not apply route daily. Kirk Nigel Berthold, FNP Taking Active   Blood Pressure Monitoring (BLOOD PRESSURE KIT) DEVI [id number] Yes Take blood pressure reading once daily Kirk Newman Nip, Kings, FNP Taking Active   fluconazole (DIFLUCAN) 150 MG tablet [id number] No Take two tablets by mouth on day 1. Repeat dose with 2 tablets by mouth on day 8 (1 week after 1st dose).  Patient not taking: Reported on 11/27/2023   Donnita Falls, FNP Not Taking Active            Med Note (Aylan Bayona A   Mon Oct 23, 2023  4:20 PM) completed  gabapentin (NEURONTIN) 300 MG capsule [id number] Yes Take 1 capsule (300 mg total) by  mouth every 8 (eight) hours as needed. Kirk Newman Nip, Tenna Child, FNP Taking Active   ketoconazole (NIZORAL) 2 % cream [id number] Yes Apply 1 Application topically 2 (two) times daily. [provider] Taking Active   ketoconazole (NIZORAL) 2 % shampoo  [id number] Yes Apply 1 Application topically once. [provider] Taking Active   Levothyroxine Sodium 125 MCG CAPS [id number] No Use as directed 125 mcg in the mouth or throat daily.  Patient not taking: Reported on 11/27/2023   [provider] Not Taking Active   losartan (COZAAR) 25 MG tablet [id number] Yes Take 1 tablet (25 mg total) by mouth daily. [name], NP Taking Active   meclizine (ANTIVERT) 25 MG tablet [id number]  Take 1 tablet (25 mg total) by mouth 2 (two) times daily as needed for dizziness.  Patient not taking: Reported on 10/23/2023   [name], FNP  Active            Med Note (Armstrong Creasy A   Mon Oct 23, 2023  4:33 PM) Did not like the way it made him feel  metFORMIN (GLUCOPHAGE) 500 MG tablet [id number] Yes Take 1 tablet by mouth once daily with breakfast Kirk Newman Nip, Frederika, FNP Taking Active   methimazole (TAPAZOLE) 5 MG tablet [id number] Yes Take 1 tablet by mouth once daily with breakfast Roma Kayser, MD Taking Active   methylPREDNISolone acetate (DEPO-MEDROL) injection 40 mg [id number]   Kirk Nigel Berthold, FNP  Active   omeprazole (PRILOSEC) 40 MG capsule [id number] Yes Take 1 capsule (40 mg total) by mouth 2 (two) times daily. Lanelle Bal, DO Taking Active            Med Note Ephriam Knuckles, [facility]   Mon Mar 27, 2023  3:32 PM)    rosuvastatin (CRESTOR) 5 MG tablet [id number] Yes Take 5 mg by mouth daily. [provider] Taking Active   sildenafil (REVATIO) 20 MG tablet [id number] Yes Take 1 tablet (20 mg total) by mouth daily. McKenzie, Mardene Celeste, MD Taking Active   sildenafil (VIAGRA) 100 MG tablet [id number] Yes Take 1 tablet (100 mg total) by mouth as needed for erectile dysfunction. McKenzie, Mardene Celeste, MD Taking Active   tiZANidine (ZANAFLEX) 4 MG tablet [id number] Yes Take 1 tablet (4 mg total) by mouth every 8 (eight) hours as needed for muscle spasms. Kirk Newman Nip, Tenna Child, FNP Taking Active   traMADol  (ULTRAM) 50 MG tablet [id number] No Take 1 tablet (50 mg total) by mouth daily as needed.  Patient not taking: Reported on 11/27/2023   [name], FNP Not Taking Active            Med Note (Kennon Encinas A   Mon Oct 23, 2023  4:32 PM) Did not like the way it made him feel            Patient Active Problem List   Diagnosis Date Noted   Spinal stenosis in cervical region 10/19/2023   Abnormal urine odor 10/19/2023   Depression, major, single episode, moderate (HCC) 07/12/2023   Hypersomnolence 06/19/2023   Dizziness 06/06/2023   Left anterior fascicular block (LAFB) 06/06/2023   Lumbar pain 04/25/2023   Hypertension 04/25/2023   Current smoker 04/05/2023   Hyponatremia 03/27/2023   Hyperthyroidism 03/27/2023   Erectile dysfunction 02/28/2023   Type 2 diabetes mellitus (HCC) 01/20/2023   Nocturia 01/18/2023   Unexplained weight loss 01/18/2023   Hepatic cirrhosis (HCC) 07/20/2022   Hyperglycemia 07/11/2017  Thrombocytopenia (HCC) 07/11/2017   Chronic back pain 06/26/2017   COPD (chronic obstructive pulmonary disease) (HCC) 04/14/2017   CVA (cerebral vascular accident) (HCC) 04/14/2017   History of alcohol abuse 04/14/2017   History of cocaine abuse (HCC) 04/14/2017   OSA (obstructive sleep apnea) 04/14/2017   History of tobacco abuse 04/14/2017   BPH (benign prostatic hyperplasia) 10/22/2016   GERD (gastroesophageal reflux disease) 10/22/2016   Aortic root dilation (HCC) 10/21/2016   Acute diastolic CHF (congestive heart failure) (HCC) 10/21/2016   Essential hypertension, benign 03/27/2013   Hepatitis C 03/27/2013   Snoring 03/27/2013    Conditions to be addressed/monitored per PCP order:  HTN and Thyroid disorder  Care Plan : RN Care Manager Plan of Care  Updates made by Heidi Dach, RN since 11/27/2023 12:00 AM     Problem: Health Management needs related to Chronic Pain      Long-Range Goal: Development of Plan of Care to address Health  Management needs related to Thyroid Disorder   Start Date: 03/21/2023  Expected End Date: 10/27/2023  Note:   Current Barriers:  Chronic Disease Management support and education needs related to HTN and Thyroid Disorder     RNCM Clinical Goal(s):  Patient will verbalize understanding of plan for management of Chronic Pain as evidenced by patient reports take all medications exactly as prescribed and will call provider for medication related questions as evidenced by patient reports    attend all scheduled medical appointments:   Physical Therapy on Tuesday and Thursday, MRI on 12/08/23, Urology on 12/29/23 and PCP on 01/04/24 as evidenced by provider documentation in EMR        continue to work with RN Care Manager and/or Social Worker to address care management and care coordination needs related to chronic pain as evidenced by adherence to CM Team Scheduled appointments        Interventions: Inter-disciplinary care team collaboration (see longitudinal plan of care) Evaluation of current treatment plan related to  self management and patient's adherence to plan as established by provider Provided patient with contact information for [facility] [phone], advised patient to call and reschedule missed appointment-revisited  Thyroid Disorder  (Status:  Goal on track:  Yes.)  Long Term Goal Evaluation of current treatment plan related to  Thyroid Disorder ,  self-management and patient's adherence to plan as established by provider. Discussed plans with patient for ongoing care management follow up and provided patient with direct contact information for care management team Reviewed medications with patient and discussed dose adjustment of Amlodipine  Reviewed scheduled/upcoming provider appointments including see list above Assessed social determinant of health barriers Encouraged patient to think positive thoughts, eat a healthy diet, exercise and get outside everyday Reviewed recent labs and  urine analysis and discussed with patient Advised patient to think positive thoughts and discuss concerns with provider Provided therapeutic listening   Hypertension Interventions:  (Status:  Goal on track:  Yes.) Long Term Goal Last practice recorded BP readings:  BP Readings from Last 3 Encounters:  11/15/23 (!) 140/97  11/10/23 106/71  10/27/23 (!) 153/107   Most recent eGFR/CrCl:  Lab Results  Component Value Date   EGFR 91 08/31/2023    No components found for: "CRCL"  Evaluation of current treatment plan related to hypertension self management and patient's adherence to plan as established by provider Reviewed medications with patient and discussed importance of compliance Discussed plans with patient for ongoing care management follow up and provided patient with direct contact information  for care management team Reviewed scheduled/upcoming provider appointments including: see list above Advised patient to exercise 30 minutes daily-discussed the benefits of exercise Advised patient to focus on what he can do and not what he can't do Advised patient to keep a journal and discuss any concerns or questions with Provider Advised patient to check BP and record, take record to next PCP visit on 01/04/24 Provided education on diet and managing HTN Discussed recent change in BP medication   Patient Goals/Self-Care Activities: Take medications as prescribed   Attend all scheduled provider appointments Call provider office for new concerns or questions  Work with the social worker to address care coordination needs and will continue to work with the clinical team to address health care and disease management related needs       Follow Up:  Patient agrees to Care Plan and Follow-up.  Plan: The Managed Medicaid care management team will reach out to the patient again over the next 30 days.  Date/time of next scheduled RN care management/care coordination outreach:  12/27/23 at  2:30pm  [name] RN, BSN [place]  [facility] Health RN Care Manager [phone]

## 2023-11-27 NOTE — Patient Instructions (Signed)
 Visit Information  Kirk Oconnor was given information about Medicaid Managed Care team care coordination services as a part of their [facility] Community Plan Medicaid benefit. Kirk Oconnor verbally consented to engagement with the [facility] Managed Care team.   If you are experiencing a medical emergency, please call 911 or report to your local emergency department or urgent care.   If you have a non-emergency medical problem during routine business hours, please contact your provider's office and ask to speak with a nurse.   For questions related to your [facility], please call: [phone] or visit the homepage here: [email or web address]  If you would like to schedule transportation through your [facility], please call the following number at least 2 days in advance of your appointment: [phone]   Rides for urgent appointments can also be made after hours by calling Member Services.  Call the Behavioral Health Crisis Line at 636-464-8146, at any time, 24 hours a day, 7 days a week. If you are in danger or need immediate medical attention call 911.  If you would like help to quit smoking, call 1-800-QUIT-NOW ((782)123-7701) OR Espaol: 1-855-Djelo-Ya (7-425-956-3875) o para ms informacin haga clic aqu or Text READY to 643-329 to register via text  Kirk Oconnor,   Please see education materials related to HTN provided by MyChart link.  Patient verbalizes understanding of instructions and care plan provided today and agrees to view in MyChart. Active MyChart status and patient understanding of how to access instructions and care plan via MyChart confirmed with patient.     Telephone follow up appointment with Managed Medicaid care management team member scheduled for:12/27/23 at 2:30pm  [name] RN, BSN [place]  [facility] Health RN  Care Manager [phone]   Following is a copy of your plan of care:  Care Plan : RN Care Manager Plan of Care  Updates made by Heidi Dach, RN since 11/27/2023 12:00 AM     Problem: Health Management needs related to Chronic Pain      Long-Range Goal: Development of Plan of Care to address Health Management needs related to Thyroid Disorder   Start Date: 03/21/2023  Expected End Date: 10/27/2023  Note:   Current Barriers:  Chronic Disease Management support and education needs related to HTN and Thyroid Disorder     RNCM Clinical Goal(s):  Patient will verbalize understanding of plan for management of Chronic Pain as evidenced by patient reports take all medications exactly as prescribed and will call provider for medication related questions as evidenced by patient reports    attend all scheduled medical appointments:   Physical Therapy on Tuesday and Thursday, MRI on 12/08/23, Urology on 12/29/23 and PCP on 01/04/24 as evidenced by provider documentation in EMR        continue to work with RN Care Manager and/or Social Worker to address care management and care coordination needs related to chronic pain as evidenced by adherence to CM Team Scheduled appointments        Interventions: Inter-disciplinary care team collaboration (see longitudinal plan of care) Evaluation of current treatment plan related to  self management and patient's adherence to plan as established by provider Provided patient with contact information for [facility] [phone], advised patient to call and reschedule missed appointment-revisited  Thyroid Disorder  (Status:  Goal on track:  Yes.)  Long Term Goal Evaluation of current treatment plan related to  Thyroid Disorder ,  self-management and patient's  adherence to plan as established by provider. Discussed plans with patient for ongoing care management follow up and provided patient with direct contact information for care management team Reviewed medications  with patient and discussed dose adjustment of Amlodipine  Reviewed scheduled/upcoming provider appointments including see list above Assessed social determinant of health barriers Encouraged patient to think positive thoughts, eat a healthy diet, exercise and get outside everyday Reviewed recent labs and urine analysis and discussed with patient Advised patient to think positive thoughts and discuss concerns with provider Provided therapeutic listening   Hypertension Interventions:  (Status:  Goal on track:  Yes.) Long Term Goal Last practice recorded BP readings:  BP Readings from Last 3 Encounters:  11/15/23 (!) 140/97  11/10/23 106/71  10/27/23 (!) 153/107   Most recent eGFR/CrCl:  Lab Results  Component Value Date   EGFR 91 08/31/2023    No components found for: "CRCL"  Evaluation of current treatment plan related to hypertension self management and patient's adherence to plan as established by provider Reviewed medications with patient and discussed importance of compliance Discussed plans with patient for ongoing care management follow up and provided patient with direct contact information for care management team Reviewed scheduled/upcoming provider appointments including: see list above Advised patient to exercise 30 minutes daily-discussed the benefits of exercise Advised patient to focus on what he can do and not what he can't do Advised patient to keep a journal and discuss any concerns or questions with Provider Advised patient to check BP and record, take record to next PCP visit on 01/04/24 Provided education on diet and managing HTN Discussed recent change in BP medication   Patient Goals/Self-Care Activities: Take medications as prescribed   Attend all scheduled provider appointments Call provider office for new concerns or questions  Work with the social worker to address care coordination needs and will continue to work with the clinical team to address health  care and disease management related needs

## 2023-11-28 ENCOUNTER — Ambulatory Visit (HOSPITAL_COMMUNITY): Payer: 59 | Attending: Family Medicine

## 2023-11-28 ENCOUNTER — Encounter (HOSPITAL_COMMUNITY): Payer: Self-pay

## 2023-11-28 DIAGNOSIS — Z7409 Other reduced mobility: Secondary | ICD-10-CM | POA: Insufficient documentation

## 2023-11-28 DIAGNOSIS — M5442 Lumbago with sciatica, left side: Secondary | ICD-10-CM | POA: Insufficient documentation

## 2023-11-28 DIAGNOSIS — G8929 Other chronic pain: Secondary | ICD-10-CM | POA: Diagnosis not present

## 2023-11-28 DIAGNOSIS — M6281 Muscle weakness (generalized): Secondary | ICD-10-CM | POA: Insufficient documentation

## 2023-11-28 DIAGNOSIS — M5441 Lumbago with sciatica, right side: Secondary | ICD-10-CM | POA: Diagnosis not present

## 2023-11-28 NOTE — Therapy (Signed)
 OUTPATIENT PHYSICAL THERAPY THORACOLUMBAR TREATMENT   Patient Name: Kirk Oconnor MRN: [id number] DOB:Aug 29, 1956, 68 y.o., male Today's Date: 11/28/2023  END OF SESSION:  PT End of Session - 11/28/23 1300     Visit Number 4    Number of Visits 12    Date for PT Re-Evaluation 12/12/23    Authorization Type [employer] Dual complete    Authorization Time Period no auth; no limit    Progress Note Due on Visit 10    PT Start Time 1301    PT Stop Time 1347    PT Time Calculation (min) 46 min    Activity Tolerance Patient tolerated treatment well    Behavior During Therapy WFL for tasks assessed/performed              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   Arthritis    CAD (coronary artery disease)    Nonobstructive at cardiac catheterization July 2018 - [facility]   Carpal tunnel syndrome    Depression    Diabetes mellitus without complication (HCC)    Essential hypertension    GERD (gastroesophageal reflux disease)    Hepatitis C    Sleep apnea    Past Surgical History:  Procedure Laterality Date   AORTIC VALVE REPLACEMENT  03/2017   [facility]   BIOPSY  12/15/2022   Procedure: BIOPSY;  Surgeon: Lanelle Bal, DO;  [facility]: [facility];  Service: Endoscopy;;   COLONOSCOPY WITH PROPOFOL N/A 12/15/2022   Procedure: COLONOSCOPY WITH PROPOFOL;  Surgeon: Lanelle Bal, DO;  Location: [facility];  Service: Endoscopy;  Laterality: N/A;  10:00 am, asa 3   ESOPHAGOGASTRODUODENOSCOPY (EGD) WITH PROPOFOL N/A 12/15/2022   Procedure: ESOPHAGOGASTRODUODENOSCOPY (EGD) WITH PROPOFOL;  Surgeon: Lanelle Bal, DO;  Location: [facility];  Service: Endoscopy;  Laterality: N/A;   POLYPECTOMY  12/15/2022   Procedure:  POLYPECTOMY;  Surgeon: Lanelle Bal, DO;  Location: [facility];  Service: Endoscopy;;   Patient Active Problem List   Diagnosis Date Noted   Spinal stenosis in cervical region 10/19/2023   Abnormal urine odor 10/19/2023   Depression, major, single episode, moderate (HCC) 07/12/2023   Hypersomnolence 06/19/2023   Dizziness 06/06/2023   Left anterior fascicular block (LAFB) 06/06/2023   Lumbar pain 04/25/2023   Hypertension 04/25/2023   Current smoker 04/05/2023   Hyponatremia 03/27/2023   Hyperthyroidism 03/27/2023   Erectile dysfunction 02/28/2023   Type 2 diabetes mellitus (HCC) 01/20/2023   Nocturia 01/18/2023   Unexplained weight loss 01/18/2023   Hepatic cirrhosis (HCC) 07/20/2022   Hyperglycemia 07/11/2017   Thrombocytopenia (HCC) 07/11/2017   Chronic back pain 06/26/2017   COPD (chronic obstructive pulmonary disease) (HCC) 04/14/2017   CVA (cerebral vascular accident) (HCC) 04/14/2017   History of alcohol abuse 04/14/2017   History of cocaine abuse (HCC) 04/14/2017   OSA (obstructive sleep apnea) 04/14/2017   History of tobacco abuse 04/14/2017   BPH (benign prostatic hyperplasia) 10/22/2016   GERD (gastroesophageal reflux disease) 10/22/2016   Aortic root dilation (HCC) 10/21/2016   Acute diastolic CHF (congestive heart failure) (HCC) 10/21/2016   Essential hypertension, benign 03/27/2013   Hepatitis C 03/27/2013   Snoring 03/27/2013    PCP: Lonna Cobb Polanco,  iliana, FNP  REFERRING PROVIDER: [name], FNP  REFERRING DIAG: M54.42,M54.41,G89.29 (ICD-10-CM) - Chronic bilateral low back pain with bilateral sciatica  Rationale for Evaluation and Treatment: Rehabilitation  THERAPY DIAG:  Chronic bilateral low back pain with bilateral sciatica  Impaired functional mobility, balance, gait, and endurance  Muscle weakness (generalized)  ONSET DATE: 7 years  SUBJECTIVE:                                                                                                                                                                                            SUBJECTIVE STATEMENT: 11/28/23:  Reports he is really sore today, pain scale 9/10 LBP.  No reports of dizziness today, stated it has been 4-5 days since he has been dizzy.  BP meds taken 30 minutes ago.    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 Pt had 7 vertebrae broken and surgery. Pt takes gabapentin for LBP and finds relief with this medication. Pt followed by many disciplines: Neurology for spinal stenosis of cervical, and per patient: a 'brain tumor,' and endrinology for overactive thyroid. Fast, repetitive movements overtime will cause increase balance deficits per patient.   Chart reviewed for brain tumor, where no diagnoses has been made but pt continued to report he has brain tumor.   PERTINENT HISTORY:  CAD Essential HTN Hepatitis C Anxiety Depression Overactive Thryoid  PAIN:  Are you having pain?  Currently 0/10. At worse 10/10.Sharp pain that makes pt sit down. Medication, rest ease pain. Once medication runs out, pain increases or cold weather affects pain.    PRECAUTIONS: None  RED FLAGS: None   WEIGHT BEARING RESTRICTIONS: No  FALLS:  Has patient fallen in last 6 months? No   PATIENT GOALS: "balance is my biggest concern"  OBJECTIVE:  Note: Objective measures were completed at Evaluation unless otherwise noted.  DIAGNOSTIC FINDINGS:  01/13/23 MRI brain 1. Small focus of hyperintense signal on diffusion weighted imaging in the periatrial white matter on the left, which could represent an acute infarct. Note if there is clinical concern for intracranial metastatic disease, further evaluation with a contrast-enhanced MRI is recommended. 2. Innumerable small foci of microhemorrhage in the bilateral cerebral and cerebellar hemispheres, which are nonspecific but can be seen in the setting  of cerebral amyloid angiopathy. 3. Diffusely T1 hypointense appearance of the cervical spine vertebral bodies, nonspecific, but marrow infiltrative process is not excluded. There is likely also severe spinal canal stenosis at C3-C4. Recommend further evaluation with a cervical spine MRI.  PATIENT SURVEYS:  ABC scale  1230/1600= 76.9% Modified Oswestry 12/50= 40%   COGNITION: Overall cognitive status: Within functional limits for tasks assessed     SENSATION: WFL  POSTURE: increased lumbar lordosis  PALPATION: Tender to palpate along lumbar paraspinals  LUMBAR ROM:   AROM eval  Flexion 100  Extension 50% * and painful  Right lateral flexion 50%  Left lateral flexion 50%  Right rotation   Left rotation    (Blank rows = not tested)  LOWER EXTREMITY ROM:     Active  Right eval Left eval  Hip flexion    Hip extension    Hip abduction    Hip adduction    Hip internal rotation    Hip external rotation    Knee flexion    Knee extension    Ankle dorsiflexion    Ankle plantarflexion    Ankle inversion    Ankle eversion     (Blank rows = not tested)  LOWER EXTREMITY MMT:    MMT Right eval Left eval  Hip flexion    Hip extension    Hip abduction    Hip adduction    Hip internal rotation    Hip external rotation    Knee flexion    Knee extension    Ankle dorsiflexion    Ankle plantarflexion    Ankle inversion    Ankle eversion     (Blank rows = not tested)  FUNCTIONAL TESTS:  30 seconds chair stand test: 10x 2 minute walk test: 214ft Single Leg Balance: R 2 seconds  : unable to perform  GAIT: Distance walked: 22ft Assistive device utilized: None Level of assistance: Complete Independence Comments: bilateral trunk sway with bilateral circumduction of BLE during swing phase and foot flat contact throughout entire gait cycle. Waddling like appearance with shoulder adduction during swing phase.   TREATMENT DATE:  11/28/23:  BP at beginning of session  161/106 mmHg HR 73 3 minutes working on seated posture and deep breathing BP at 145/100 mmHg  STS with GTB around thigh no UE A 10x 2 sets cueing for eccentric control Rows with RTB 15x5"  1:25 reports of slight dizziness requested BP 1298/95 mmHg HR at 80 bpm  Supine: Bridge with theraband around thigh 10x 5" Hamstring stretch with rope 2x 30"  Sidelying: Clam with RTB 10x 5"  BP at EOS 144/104 mmHg, HR 84  11/13/23 BP at beginning of session 126/94 mmHg HR 78 bpm Seated: sit to stands no UE assist 10X  Scapular retractions 10X5"  Postural education ` Leg extended to floor stretch 3X20" each (too tight long sitting) Supine: Bridge 10X  SLR 10X each  SKTC 3X20" with UE assist  LTR 10X5" Nustep level 7 seat 10 UE/LE 5 minutes [place] beach  11/08/23: BP initially 162/104 mmHg HR at 81 bpm 3 minute deep breathing  BP 146/102 mmHg  Supine: Bridge 2x 10  Gait training to improve heel to toe 59ft  BP at 146/105 mmHg HR at 78  10/31/2023 PT Evaluation  PATIENT EDUCATION:  Education details: PT Evaluation, findings, prognosis, frequency, attendance policy, and HEP if given.  Person educated: Patient Education method: Medical illustrator Education comprehension: verbalized understanding  HOME EXERCISE PROGRAM: Access Code: [email or web address] URL: [email or web address] Date: 11/08/2023 Prepared by: Becky Sax  Exercises - Supine Bridge  - 1-2 x daily - 7 x weekly - 3 sets - 10 reps - 5" hold  ASSESSMENT:  CLINICAL IMPRESSION: BP taken at beginning of session which was high, monitored through session.  Pt with 1 occurrence of c/o dizziness through session.  Pt educated on importance of taking BP medication at same time daily and to monitor at home.  Session focus with proximal strengthening and stretches to improve LE mobility  to assist with seated posture.  Pt admits to non-compliance with HEP, stated all he is doing currently is walking.  Pt educated on specific exercises to target his weak musculature and educated on importance of completing regularly to assist with strength.  Pt ambulates with significant antalgic gait mechanics, educated on importance of heel to toe mechanics and to increase stride length to normalize gait and reduce risk of falls.  Added glut med strengthening exercises this session and additional to HEP with printout given.   Eval:  Patient is a 68 y.o. male who was seen today for physical therapy evaluation and treatment for M54.42,M54.41,G89.29 (ICD-10-CM) - Chronic bilateral low back pain with bilateral sciatica. Pt with multiple system involvement which overlaps with chronic low back pain. Pt has involvement of cervical spine stenosis, pt potentially has brain cancer, per patient, as well is followed by endocrinology and other providers, increasing pt's complexity evaluation due to multiple sources of body system overlapping pt's objective impairments. Pt demonstrating significant limitations in functional mobility, ADLs, transfers and safety due to muscle weakness, balance deficits, ROM restrictions and chronic pain. Pt will benefit from skilled Physical Therapy services to address deficits/limitations in order to improve functional and QOL.    OBJECTIVE IMPAIRMENTS: Abnormal gait, decreased activity tolerance, decreased balance, decreased mobility, decreased ROM, decreased strength, dizziness, postural dysfunction, and pain.   ACTIVITY LIMITATIONS: carrying, lifting, bending, standing, transfers, and locomotion level  PARTICIPATION LIMITATIONS: community activity and occupation  PERSONAL FACTORS: Age and 3+ comorbidities: see above for details  are also affecting patient's functional outcome.   REHAB POTENTIAL: Fair increased complexity and pending results  CLINICAL DECISION MAKING:  Unstable/unpredictable  EVALUATION COMPLEXITY: High   GOALS: Goals reviewed with patient? No  SHORT TERM GOALS: Target date: 11/21/2023  Pt will be independent with HEP in order to demonstrate participation in Physical Therapy POC.  Baseline: Goal status: INITIAL   LONG TERM GOALS: Target date: 12/12/2023  Pt will improve 30 Second Chair Stand Test by >4 in order to demonstrate improved functional strength to return to desired activities.  Baseline:  See objective.  Goal status: INITIAL  2.  Pt will improve 2 MWT by 178ft in order to demonstrate improved functional ambulatory capacity in community setting.  Baseline: See objective.  Goal status: INITIAL  3.  Pt will improve Modified Oswestry score by 5 points in order to demonstrate improved pain with functional goals and outcomes. Baseline: See objective.  Goal status: INITIAL  4.  Pt will improve ABC scale by 5 points in order to demonstrate improved functional balance reporting to enhance pt's safety.  Baseline: See objective.  Goal status: INITIAL  PLAN:  PT FREQUENCY: 2x/week  PT DURATION: 6 weeks  PLANNED INTERVENTIONS: 97164- PT Re-evaluation, 97110-Therapeutic exercises, 97530- Therapeutic  activity, [id number]- Neuromuscular re-education, [phone]- Self Care, 60454- Manual therapy, [id number]- Gait training, 97014- Electrical stimulation (unattended), [id number]- Electrical stimulation (manual), [id number]- Traction (mechanical), Patient/Family education, Balance training, Stair training, Cryotherapy, and Moist heat.  PLAN FOR NEXT SESSION: functional strengthening, narrow balance training, gait training, etc.   Stretches to increase spinal and LE mobility. Postural education.   Becky Sax, LPTA/CLT; CBIS [phone]   Juel Burrow, PTA 11/28/2023, 3:00 PM

## 2023-11-30 ENCOUNTER — Ambulatory Visit (HOSPITAL_COMMUNITY): Payer: 59

## 2023-11-30 DIAGNOSIS — M6281 Muscle weakness (generalized): Secondary | ICD-10-CM

## 2023-11-30 DIAGNOSIS — Z7409 Other reduced mobility: Secondary | ICD-10-CM

## 2023-11-30 DIAGNOSIS — G8929 Other chronic pain: Secondary | ICD-10-CM | POA: Diagnosis not present

## 2023-11-30 DIAGNOSIS — M5441 Lumbago with sciatica, right side: Secondary | ICD-10-CM | POA: Diagnosis not present

## 2023-11-30 DIAGNOSIS — M5442 Lumbago with sciatica, left side: Secondary | ICD-10-CM | POA: Diagnosis not present

## 2023-11-30 NOTE — Therapy (Signed)
 OUTPATIENT PHYSICAL THERAPY THORACOLUMBAR TREATMENT   Patient Name: Kirk Oconnor MRN: [id number] DOB:[birth year], 68 y.o., male Today's Date: 11/30/2023  END OF SESSION:  PT End of Session - 11/30/23 1452     Visit Number 5    Number of Visits 12    Date for PT Re-Evaluation 12/12/23    Authorization Type [employer] Dual complete    Authorization Time Period no auth; no limit    PT Start Time 1430    PT Stop Time 1510    PT Time Calculation (min) 40 min    Activity Tolerance Patient tolerated treatment well    Behavior During Therapy WFL for tasks assessed/performed               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   Arthritis    CAD (coronary artery disease)    Nonobstructive at cardiac catheterization July 2018 - [facility]   Carpal tunnel syndrome    Depression    Diabetes mellitus without complication (HCC)    Essential hypertension    GERD (gastroesophageal reflux disease)    Hepatitis C    Sleep apnea    Past Surgical History:  Procedure Laterality Date   AORTIC VALVE REPLACEMENT  03/2017   [facility]   BIOPSY  12/15/2022   Procedure: BIOPSY;  Surgeon: Lanelle Bal, DO;  Location: [facility];  Service: Endoscopy;;   COLONOSCOPY WITH PROPOFOL N/A 12/15/2022   Procedure: COLONOSCOPY WITH PROPOFOL;  Surgeon: Lanelle Bal, DO;  Location: [facility];  Service: Endoscopy;  Laterality: N/A;  10:00 am, asa 3   ESOPHAGOGASTRODUODENOSCOPY (EGD) WITH PROPOFOL N/A 12/15/2022   Procedure: ESOPHAGOGASTRODUODENOSCOPY (EGD) WITH PROPOFOL;  Surgeon: Lanelle Bal, DO;  Location: [facility];  Service: Endoscopy;  Laterality: N/A;   POLYPECTOMY  12/15/2022   Procedure: POLYPECTOMY;  Surgeon: Lanelle Bal, DO;  Location: [facility];  Service: Endoscopy;;   Patient Active Problem List   Diagnosis Date Noted   Spinal stenosis in cervical region 10/19/2023   Abnormal urine odor 10/19/2023   Depression, major, single episode, moderate (HCC) 07/12/2023   Hypersomnolence 06/19/2023   Dizziness 06/06/2023   Left anterior fascicular block (LAFB) 06/06/2023   Lumbar pain 04/25/2023   Hypertension 04/25/2023   Current smoker 04/05/2023   Hyponatremia 03/27/2023   Hyperthyroidism 03/27/2023   Erectile dysfunction 02/28/2023   Type 2 diabetes mellitus (HCC) 01/20/2023   Nocturia 01/18/2023   Unexplained weight loss 01/18/2023   Hepatic cirrhosis (HCC) 07/20/2022   Hyperglycemia 07/11/2017   Thrombocytopenia (HCC) 07/11/2017   Chronic back pain 06/26/2017   COPD (chronic obstructive pulmonary disease) (HCC) 04/14/2017   CVA (cerebral vascular accident) (HCC) 04/14/2017   History of alcohol abuse 04/14/2017   History of cocaine abuse (HCC) 04/14/2017   OSA (obstructive sleep apnea) 04/14/2017   History of tobacco abuse 04/14/2017   BPH (benign prostatic hyperplasia) 10/22/2016   GERD (gastroesophageal reflux disease) 10/22/2016   Aortic root dilation (HCC) 10/21/2016   Acute diastolic CHF (congestive heart failure) (HCC) 10/21/2016   Essential hypertension, benign 03/27/2013   Hepatitis C 03/27/2013   Snoring 03/27/2013    PCP: [name], FNP  REFERRING PROVIDER: Jennette Banker,  iliana, FNP  REFERRING DIAG: M54.42,M54.41,G89.29 (ICD-10-CM) - Chronic bilateral low back pain with bilateral sciatica  Rationale for Evaluation and Treatment: Rehabilitation  THERAPY DIAG:  Chronic bilateral low back pain with bilateral sciatica  Impaired functional mobility, balance, gait, and endurance  Muscle weakness (generalized)  ONSET DATE: 7 years  SUBJECTIVE:                                                                                                                                                                                            SUBJECTIVE STATEMENT: 11/30/23:  Patient states that his back is hurting today (9/10). Denies pain on the legs. 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 Pt had 7 vertebrae broken and surgery. Pt takes gabapentin for LBP and finds relief with this medication. Pt followed by many disciplines: Neurology for spinal stenosis of cervical, and per patient: a 'brain tumor,' and endrinology for overactive thyroid. Fast, repetitive movements overtime will cause increase balance deficits per patient.   Chart reviewed for brain tumor, where no diagnoses has been made but pt continued to report he has brain tumor.   PERTINENT HISTORY:  CAD Essential HTN Hepatitis C Anxiety Depression Overactive Thryoid  PAIN:  Are you having pain?  Currently 0/10. At worse 10/10.Sharp pain that makes pt sit down. Medication, rest ease pain. Once medication runs out, pain increases or cold weather affects pain.    PRECAUTIONS: None  RED FLAGS: None   WEIGHT BEARING RESTRICTIONS: No  FALLS:  Has patient fallen in last 6 months? No   PATIENT GOALS: "balance is my biggest concern"  OBJECTIVE:  Note: Objective measures were completed at Evaluation unless otherwise noted.  DIAGNOSTIC FINDINGS:  01/13/23 MRI brain 1. Small focus of hyperintense signal on diffusion weighted imaging in the periatrial white matter on the left, which could represent an acute infarct. Note if there is clinical concern for intracranial metastatic disease, further evaluation with a contrast-enhanced MRI is recommended. 2. Innumerable small foci of microhemorrhage in the bilateral cerebral and cerebellar hemispheres, which are nonspecific but can be seen in the setting of cerebral amyloid angiopathy. 3. Diffusely T1 hypointense appearance of the  cervical spine vertebral bodies, nonspecific, but marrow infiltrative process is not excluded. There is likely also severe spinal canal stenosis at C3-C4. Recommend further evaluation with a cervical spine MRI.  PATIENT SURVEYS:  ABC scale 1230/1600= 76.9% Modified Oswestry 12/50= 40%   COGNITION: Overall cognitive status: Within functional limits for tasks assessed  SENSATION: WFL  POSTURE: increased lumbar lordosis  PALPATION: Tender to palpate along lumbar paraspinals  LUMBAR ROM:   AROM eval  Flexion 100  Extension 50% * and painful  Right lateral flexion 50%  Left lateral flexion 50%  Right rotation   Left rotation    (Blank rows = not tested)  LOWER EXTREMITY ROM:     Active  Right eval Left eval  Hip flexion    Hip extension    Hip abduction    Hip adduction    Hip internal rotation    Hip external rotation    Knee flexion    Knee extension    Ankle dorsiflexion    Ankle plantarflexion    Ankle inversion    Ankle eversion     (Blank rows = not tested)  LOWER EXTREMITY MMT:    MMT Right eval Left eval  Hip flexion    Hip extension    Hip abduction    Hip adduction    Hip internal rotation    Hip external rotation    Knee flexion    Knee extension    Ankle dorsiflexion    Ankle plantarflexion    Ankle inversion    Ankle eversion     (Blank rows = not tested)  FUNCTIONAL TESTS:  30 seconds chair stand test: 10x 2 minute walk test: 248ft Single Leg Balance: R 2 seconds  : unable to perform  GAIT: Distance walked: 267ft Assistive device utilized: None Level of assistance: Complete Independence Comments: bilateral trunk sway with bilateral circumduction of BLE during swing phase and foot flat contact throughout entire gait cycle. Waddling like appearance with shoulder adduction during swing phase.   TREATMENT DATE:  11/30/23 Vital signs monitoring (initial) BP =  94/66, SpO2 = 96%, HR 76 with c/o lightheadedness (10/10 intensity);  positioned in supine and BP =  122/80, SpO2 = 93%, HR = 85 Recumbent bike, seat 13, level 1, 5' BP =  122/80, SpO2 = 93%, HR = 85 (after bike) Bridging with hip abd x RTB x 3" x 10 Seated abdominal press with physioball, neutral spine x 3" x 10 x 2 Seated marches x 10 x 2 BP = 139/86, HR = 78, SpO2 = 98% (rates lightheadedness 3/10)  11/28/23:  BP at beginning of session 161/106 mmHg HR 73 3 minutes working on seated posture and deep breathing BP at 145/100 mmHg  STS with GTB around thigh no UE A 10x 2 sets cueing for eccentric control Rows with RTB 15x5"  1:25 reports of slight dizziness requested BP 1298/95 mmHg HR at 80 bpm  Supine: Bridge with theraband around thigh 10x 5" Hamstring stretch with rope 2x 30"  Sidelying: Clam with RTB 10x 5"  BP at EOS 144/104 mmHg, HR 84  11/13/23 BP at beginning of session 126/94 mmHg HR 78 bpm Seated: sit to stands no UE assist 10X  Scapular retractions 10X5"  Postural education ` Leg extended to floor stretch 3X20" each (too tight long sitting) Supine: Bridge 10X  SLR 10X each  SKTC 3X20" with UE assist  LTR 10X5" Nustep level 7 seat 10 UE/LE 5 minutes ATL beach  11/08/23: BP initially 162/104 mmHg HR at 81 bpm 3 minute deep breathing  BP 146/102 mmHg  Supine: Bridge 2x 10  Gait training to improve heel to toe 33ft  BP at 146/105 mmHg HR at 78  10/31/2023 PT Evaluation  PATIENT EDUCATION:  Education details: PT Evaluation, findings, prognosis, frequency, attendance policy, and HEP if given.  Person educated: Patient Education method: Medical illustrator Education comprehension: verbalized understanding  HOME EXERCISE PROGRAM: Access Code: [email or web address] URL: [email or web address] Date: 11/08/2023 Prepared by: Becky Sax  Exercises - Supine Bridge  - 1-2 x daily - 7 x  weekly - 3 sets - 10 reps - 5" hold  ASSESSMENT:  CLINICAL IMPRESSION: Interventions today were geared towards core and LE strengthening. BP was low at the beginning of today's session (see above) with c/o lightheadedness. Positioned patient in supine which normalized B (see above). Lightheadedness was also better. Tolerated all activities without worsening of symptoms. Demonstrated appropriate levels of fatigue. Pacing of activity was slow and rest periods was provided. Provided mild to moderate amount of cueing to ensure correct execution of activity with fair carry-over due to weakness. To date, skilled PT is required to address the impairments and improve function.   Eval:  Patient is a 68 y.o. male who was seen today for physical therapy evaluation and treatment for M54.42,M54.41,G89.29 (ICD-10-CM) - Chronic bilateral low back pain with bilateral sciatica. Pt with multiple system involvement which overlaps with chronic low back pain. Pt has involvement of cervical spine stenosis, pt potentially has brain cancer, per patient, as well is followed by endocrinology and other providers, increasing pt's complexity evaluation due to multiple sources of body system overlapping pt's objective impairments. Pt demonstrating significant limitations in functional mobility, ADLs, transfers and safety due to muscle weakness, balance deficits, ROM restrictions and chronic pain. Pt will benefit from skilled Physical Therapy services to address deficits/limitations in order to improve functional and QOL.    OBJECTIVE IMPAIRMENTS: Abnormal gait, decreased activity tolerance, decreased balance, decreased mobility, decreased ROM, decreased strength, dizziness, postural dysfunction, and pain.   ACTIVITY LIMITATIONS: carrying, lifting, bending, standing, transfers, and locomotion level  PARTICIPATION LIMITATIONS: community activity and occupation  PERSONAL FACTORS: Age and 3+ comorbidities: see above for details  are  also affecting patient's functional outcome.   REHAB POTENTIAL: Fair increased complexity and pending results  CLINICAL DECISION MAKING: Unstable/unpredictable  EVALUATION COMPLEXITY: High   GOALS: Goals reviewed with patient? No  SHORT TERM GOALS: Target date: 11/21/2023  Pt will be independent with HEP in order to demonstrate participation in Physical Therapy POC.  Baseline: Goal status: INITIAL   LONG TERM GOALS: Target date: 12/12/2023  Pt will improve 30 Second Chair Stand Test by >4 in order to demonstrate improved functional strength to return to desired activities.  Baseline:  See objective.  Goal status: INITIAL  2.  Pt will improve 2 MWT by 18ft in order to demonstrate improved functional ambulatory capacity in community setting.  Baseline: See objective.  Goal status: INITIAL  3.  Pt will improve Modified Oswestry score by 5 points in order to demonstrate improved pain with functional goals and outcomes. Baseline: See objective.  Goal status: INITIAL  4.  Pt will improve ABC scale by 5 points in order to demonstrate improved functional balance reporting to enhance pt's safety.  Baseline: See objective.  Goal status: INITIAL  PLAN:  PT FREQUENCY: 2x/week  PT DURATION: 6 weeks  PLANNED INTERVENTIONS: 97164- PT Re-evaluation, 97110-Therapeutic exercises, 97530- Therapeutic activity, 97112- Neuromuscular re-education, 97535- Self Care, 62952- Manual therapy, [id number]- Gait training, 97014- Electrical stimulation (unattended), [id number]- Electrical stimulation (manual), [id number]- Traction (mechanical), Patient/Family education, Balance training, Stair training, Cryotherapy, and Moist heat.  PLAN FOR NEXT SESSION: functional strengthening, narrow balance training, gait training, etc.  Stretches to increase spinal and LE mobility. Postural education.   Tish Frederickson. Nasra Counce, PT, DPT, OCS Board-Certified Clinical Specialist in Orthopedic PT PT Compact Privilege # ([place]):  [id number] T 11/30/2023, 2:53 PM

## 2023-12-01 ENCOUNTER — Encounter: Payer: Self-pay | Admitting: Family Medicine

## 2023-12-01 ENCOUNTER — Ambulatory Visit: Admitting: Family Medicine

## 2023-12-01 VITALS — BP 112/80 | HR 83 | Ht 66.0 in | Wt 161.1 lb

## 2023-12-01 DIAGNOSIS — M5440 Lumbago with sciatica, unspecified side: Secondary | ICD-10-CM

## 2023-12-01 DIAGNOSIS — M5416 Radiculopathy, lumbar region: Secondary | ICD-10-CM | POA: Diagnosis not present

## 2023-12-01 DIAGNOSIS — I10 Essential (primary) hypertension: Secondary | ICD-10-CM | POA: Diagnosis not present

## 2023-12-01 DIAGNOSIS — G8929 Other chronic pain: Secondary | ICD-10-CM

## 2023-12-01 MED ORDER — PREGABALIN 50 MG PO CAPS: 50.0000 mg | ORAL_CAPSULE | Freq: Two times a day (BID) | ORAL | 2 refills | Status: DC

## 2023-12-01 NOTE — Progress Notes (Signed)
 Established Patient Office Visit   Subjective  Patient ID: Kirk Oconnor, male    DOB: 1956-08-27  Age: 68 y.o. MRN: [id number]  Chief Complaint  Patient presents with   Follow-up    4 wk f/u for HTN  Having pain from PT> pain meds not helping.   Pain and tingling in feet. Not able to feel feet at night when he wakes up for restroom break. Hands are numb all the time even w/ taking gabapentin     He  has a past medical history of Anxiety, Aortic regurgitation (03/2017), Aortic root aneurysm (03/2017), Arthritis, CAD (coronary artery disease), Carpal tunnel syndrome, Depression, Diabetes mellitus without complication (HCC), Essential hypertension, GERD (gastroesophageal reflux disease), Hepatitis C, and Sleep apnea.  Patient reports chronic painwith no specific injury identified as the cause. The pain originates in the lower back and radiates to the legs. It is described as aching, sharp, and throbbing, with an intensity of 10/10, occurring constantly throughout the day. The symptoms worsen with sitting, standing, and walking, and are present all day. Physical therapy has provided moderate relief, but the patient has not found relief from tramadol or gabapentin.    Review of Systems  Constitutional:  Negative for chills and fever.  Respiratory:  Negative for shortness of breath.   Cardiovascular:  Negative for chest pain.  Musculoskeletal:  Positive for back pain, joint pain and myalgias.  Neurological:  Negative for dizziness and headaches.      Objective:     BP 112/80   Pulse 83   Ht 5\' 6"  (1.676 m)   Wt 161 lb 1.4 oz (73.1 kg)   SpO2 96%   BMI 26.00 kg/m  BP Readings from Last 3 Encounters:  12/01/23 112/80  11/15/23 (!) 140/97  11/10/23 106/71      Physical Exam Vitals reviewed.  Constitutional:      General: He is not in acute distress.    Appearance: Normal appearance. He is not ill-appearing, toxic-appearing or diaphoretic.  HENT:     Head: Normocephalic.   Eyes:     General:        Right eye: No discharge.        Left eye: No discharge.     Conjunctiva/sclera: Conjunctivae normal.  Cardiovascular:     Rate and Rhythm: Normal rate.     Pulses: Normal pulses.     Heart sounds: Normal heart sounds.  Pulmonary:     Effort: Pulmonary effort is normal. No respiratory distress.     Breath sounds: Normal breath sounds.  Musculoskeletal:     Lumbar back: Tenderness present. Decreased range of motion. Positive right straight leg raise test and positive left straight leg raise test.  Skin:    General: Skin is warm and dry.     Capillary Refill: Capillary refill takes less than 2 seconds.  Neurological:     Mental Status: He is alert.     Motor: Weakness present.  Psychiatric:        Mood and Affect: Mood normal.        Behavior: Behavior normal.      No results found for any visits on 12/01/23.  The ASCVD Risk score (Arnett DK, et al., 2019) failed to calculate for the following reasons:   Risk score cannot be calculated because patient has a medical history suggesting prior/existing ASCVD    Assessment & Plan:  Lumbar back pain with radiculopathy affecting lower extremity -     Pregabalin; Take  1 capsule (50 mg total) by mouth 2 (two) times daily.  Dispense: 60 capsule; Refill: 2  Chronic midline low back pain with sciatica, sciatica laterality unspecified Assessment & Plan: Mri results showed: Moderate posterior canal and severe right greater than left foraminal narrowing with possible encroachment on exiting nerve root on the right. L2-3, L3-4 and L5-S1 also show mild posterior canal and severe bilateral right greater than left foraminal narrowing.  Patient currently has physical therapy sessions Being followed by Neurosurgery, patient refusing surgery Trial on Lyrica 50 mg twice daily for pain management      Return if symptoms worsen or fail to improve.   Cruzita Lederer Newman Nip, FNP

## 2023-12-01 NOTE — Assessment & Plan Note (Signed)
 Vitals:   12/01/23 1602  BP: 112/80  Controlled, continue Amlodipine 10 mg daily, continue Losartan 25 mg once daily. Continued discussion on DASH diet, low sodium diet and maintain a exercise routine for 150 minutes per week.

## 2023-12-01 NOTE — Assessment & Plan Note (Addendum)
 Mri results showed: Moderate posterior canal and severe right greater than left foraminal narrowing with possible encroachment on exiting nerve root on the right. L2-3, L3-4 and L5-S1 also show mild posterior canal and severe bilateral right greater than left foraminal narrowing.  Patient currently has physical therapy sessions Being followed by Neurosurgery, patient refusing surgery Trial on Lyrica 50 mg twice daily for pain management

## 2023-12-01 NOTE — Patient Instructions (Signed)
        Great to see you today.  I have refilled the medication(s) we provide.   Walk in for Lab work:  [facility] [address] [place], [place] [address]  [phone] Monday- Friday: 8?AM-12:30?PM, 1:30-5?PM     If labs were collected, we will inform you of lab results once received either by echart message or telephone call.   - echart message- for normal results that have been seen by the patient already.   - telephone call: abnormal results or if patient has not viewed results in their echart.   - Please take medications as prescribed. - Follow up with your primary health provider if any health concerns arises. - If symptoms worsen please contact your primary care provider and/or visit the emergency department.

## 2023-12-05 ENCOUNTER — Encounter (HOSPITAL_COMMUNITY): Payer: 59

## 2023-12-05 ENCOUNTER — Telehealth: Payer: Self-pay | Admitting: Family Medicine

## 2023-12-05 ENCOUNTER — Telehealth (HOSPITAL_COMMUNITY): Payer: Self-pay

## 2023-12-05 NOTE — Telephone Encounter (Signed)
 Copied from CRM [id number]. Topic: General - Other >> Dec 05, 2023 10:04 AM Eunice Blase wrote: Reason for CRM:Pt called stated pregabalin (LYRICA) 50 MG capsule is not effective. Please call pt [phone] to discuss.

## 2023-12-05 NOTE — Telephone Encounter (Signed)
 No show #1, called and spoke to pt who stated he called earlier today and informed front desk that he did not have transportation to make it. Therapist tried to give him correct number but he didn't have anything to write with. Reminded next apt date and time or next apt and encouraged to call and cancel/reschedule if unable to make it to apts in the future.  Becky Sax, LPTA/CLT; [place] [phone]

## 2023-12-05 NOTE — Telephone Encounter (Addendum)
 Patient will need to call Ortho office- we do not have access to schedule or reschedule appts outside of our office.

## 2023-12-05 NOTE — Telephone Encounter (Signed)
 Copied from CRM [id number]. Topic: General - Other >> Dec 05, 2023 10:09 AM Eunice Blase wrote: Reason for CRM: Received call from pt needs to cancel appt for TREATMENT-ORTHO [161] [facility].

## 2023-12-06 NOTE — Telephone Encounter (Signed)
 Please let patient know: patient should take Lyrica 50 mg twice daily until I increase the dosage to 100 mg in three weeks for optimal effectiveness.

## 2023-12-07 ENCOUNTER — Encounter (HOSPITAL_COMMUNITY): Payer: 59

## 2023-12-07 ENCOUNTER — Encounter (HOSPITAL_COMMUNITY): Payer: Self-pay

## 2023-12-07 NOTE — Therapy (Signed)
 [facility] Outpatient Rehabilitation at [facility] [address] [place], [place], [address] Phone: [phone]   Fax:  [phone]  Patient Details  Name: Kirk Oconnor MRN: [id number] Date of Birth: 05-08-1956 Referring Provider:  No ref. provider found  Encounter Date: 12/07/2023   Pt called regarding no show #2. Pt reporting he tried calling to cancel and did no have a ride. Pt informed/reminded of no show policy.    Nelida Meuse, PT 12/07/2023, 4:06 PM  [facility] Outpatient Rehabilitation at [facility] [address] [place], [place], [address] Phone: [phone]   Fax:  [phone]

## 2023-12-08 ENCOUNTER — Ambulatory Visit
Admission: RE | Admit: 2023-12-08 | Discharge: 2023-12-08 | Disposition: A | Discharge status: Home / Self Care | Payer: 59 | Source: Ambulatory Visit | Attending: Diagnostic Neuroimaging | Admitting: Diagnostic Neuroimaging

## 2023-12-08 DIAGNOSIS — R9089 Other abnormal findings on diagnostic imaging of central nervous system: Secondary | ICD-10-CM | POA: Diagnosis not present

## 2023-12-08 MED ORDER — GADOPICLENOL 0.5 MMOL/ML IV SOLN
7.5000 mL | Freq: Once | INTRAVENOUS | Status: AC | PRN
  Administered 2023-12-08: 7.5 mL via INTRAVENOUS

## 2023-12-11 NOTE — Telephone Encounter (Signed)
Attempted to contact pt, vm box is full

## 2023-12-11 NOTE — Telephone Encounter (Signed)
 Pt stated that the Pregabalin is causing his muscles to jump and it is causing severe dizziness. He has stopped taking it all together. He would like to continue taking the gabapentin. Please advise if this is safe.

## 2023-12-12 ENCOUNTER — Encounter (HOSPITAL_COMMUNITY): Payer: 59

## 2023-12-12 NOTE — Telephone Encounter (Signed)
Gabapentin is fine

## 2023-12-13 ENCOUNTER — Telehealth: Payer: Self-pay | Admitting: Neurology

## 2023-12-13 DIAGNOSIS — R9089 Other abnormal findings on diagnostic imaging of central nervous system: Secondary | ICD-10-CM

## 2023-12-13 DIAGNOSIS — M4802 Spinal stenosis, cervical region: Secondary | ICD-10-CM

## 2023-12-13 DIAGNOSIS — R42 Dizziness and giddiness: Secondary | ICD-10-CM

## 2023-12-13 DIAGNOSIS — M48062 Spinal stenosis, lumbar region with neurogenic claudication: Secondary | ICD-10-CM

## 2023-12-13 DIAGNOSIS — R269 Unspecified abnormalities of gait and mobility: Secondary | ICD-10-CM

## 2023-12-13 NOTE — Progress Notes (Signed)
 No evidence of cancer. Ongoing small vessel disease noted. Continue medical management. -VRP

## 2023-12-13 NOTE — Telephone Encounter (Signed)
 Pt called in asking for results from the MRI brain. I was able to see where Dr Marjory Lies reviewed the results from the MRI brain. "No evidence of cancer. Ongoing small vessel disease noted. Continue medical management. -VRP "  Advised the pt of the results. He states he is still having concerns with dizziness. Pt is working with PT but they aren't working with him on vestibular rehab exercises. He asked if another referral could be entered in to include that. He also asked about the endocrinologist in [place] and wandering if they could get him in sooner. I advised that he would need to reach out to their office and ask what their turn around time frame is. Pt verbalized understanding.

## 2023-12-14 ENCOUNTER — Encounter (HOSPITAL_COMMUNITY): Payer: 59

## 2023-12-15 NOTE — Telephone Encounter (Signed)
 Pt. Informed. He will restart the gabapentin

## 2023-12-18 ENCOUNTER — Ambulatory Visit (HOSPITAL_COMMUNITY)

## 2023-12-18 DIAGNOSIS — M5441 Lumbago with sciatica, right side: Secondary | ICD-10-CM | POA: Diagnosis not present

## 2023-12-18 DIAGNOSIS — G8929 Other chronic pain: Secondary | ICD-10-CM

## 2023-12-18 DIAGNOSIS — M5442 Lumbago with sciatica, left side: Secondary | ICD-10-CM | POA: Diagnosis not present

## 2023-12-18 DIAGNOSIS — M6281 Muscle weakness (generalized): Secondary | ICD-10-CM | POA: Diagnosis not present

## 2023-12-18 DIAGNOSIS — Z7409 Other reduced mobility: Secondary | ICD-10-CM | POA: Diagnosis not present

## 2023-12-18 NOTE — Therapy (Signed)
 OUTPATIENT PHYSICAL THERAPY THORACOLUMBAR TREATMENT/PROGRESS NOTE/DISCHARGE  Progress Note Reporting Period 10/31/23 to 12/18/23  See note below for Objective Data and Assessment of Progress/Goals.   PHYSICAL THERAPY DISCHARGE SUMMARY  Visits from Start of Care: 6  Current functional level related to goals / functional outcomes: See below   Remaining deficits: See below   Education / Equipment: HEP   Patient agrees to discharge. Patient goals were partially met. Patient is being discharged due to the patient's request. To deal with other medical issues.         Patient Name: Kirk Oconnor MRN: [id number] DOB:1956/04/19, 68 y.o., male Today's Date: 12/18/2023  END OF SESSION:  PT End of Session - 12/18/23 1147     Visit Number 6    Number of Visits 12    Date for PT Re-Evaluation 12/12/23    Authorization Type [employer] Dual complete    Authorization Time Period no auth; no limit    PT Start Time 1145    PT Stop Time 1225    PT Time Calculation (min) 40 min    Activity Tolerance Patient tolerated treatment well    Behavior During Therapy WFL for tasks assessed/performed               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   Arthritis    CAD (coronary artery disease)    Nonobstructive at cardiac catheterization July 2018 - [facility]   Carpal tunnel syndrome    Depression    Diabetes mellitus without complication (HCC)    Essential hypertension    GERD (gastroesophageal reflux disease)    Hepatitis C    Sleep apnea    Past Surgical History:  Procedure Laterality Date   AORTIC VALVE REPLACEMENT  03/2017   [facility]   BIOPSY  12/15/2022   Procedure: BIOPSY;  Surgeon: Lanelle Bal, DO;  Location: [facility]  ENDO SUITE;  Service: Endoscopy;;   COLONOSCOPY WITH PROPOFOL N/A 12/15/2022   Procedure: COLONOSCOPY WITH PROPOFOL;  Surgeon: Lanelle Bal, DO;  Location: [facility] ENDO [facility];  Service: Endoscopy;  Laterality: N/A;  10:00 am, asa 3   ESOPHAGOGASTRODUODENOSCOPY (EGD) WITH PROPOFOL N/A 12/15/2022   Procedure: ESOPHAGOGASTRODUODENOSCOPY (EGD) WITH PROPOFOL;  Surgeon: Lanelle Bal, DO;  Location: [facility] ENDO [facility];  Service: Endoscopy;  Laterality: N/A;   POLYPECTOMY  12/15/2022   Procedure: POLYPECTOMY;  Surgeon: Lanelle Bal, DO;  Location: [facility] END[facility];  Service: Endoscopy;;   Patient Active Problem List   Diagnosis Date Noted   Spinal stenosis in cervical region 10/19/2023   Abnormal urine odor 10/19/2023   Depression, major, single episode, moderate (HCC) 07/12/2023   Hypersomnolence 06/19/2023   Dizziness 06/06/2023   Left anterior fascicular block (LAFB) 06/06/2023   Lumbar pain 04/25/2023   Hypertension 04/25/2023   Current smoker 04/05/2023   Hyponatremia 03/27/2023   Hyperthyroidism 03/27/2023   Erectile dysfunction 02/28/2023   Type 2 diabetes mellitus (HCC) 01/20/2023   Nocturia 01/18/2023   Unexplained weight loss 01/18/2023   Hepatic cirrhosis (HCC) 07/20/2022   Hyperglycemia 07/11/2017   Thrombocytopenia (HCC) 07/11/2017   Chronic back pain 06/26/2017   COPD (chronic obstructive pulmonary disease) (HCC) 04/14/2017   CVA (cerebral vascular accident) (HCC) 04/14/2017  History of alcohol abuse 04/14/2017   History of cocaine abuse (HCC) 04/14/2017   OSA (obstructive sleep apnea) 04/14/2017   History of tobacco abuse 04/14/2017   BPH (benign prostatic hyperplasia) 10/22/2016   GERD (gastroesophageal reflux disease) 10/22/2016   Aortic root dilation (HCC) 10/21/2016   Acute diastolic CHF (congestive heart failure) (HCC) 10/21/2016   Essential hypertension, benign 03/27/2013   Hepatitis C 03/27/2013   Snoring 03/27/2013    PCP: [name],  FNP  REFERRING PROVIDER: [name], FNP  REFERRING DIAG: M54.42,M54.41,G89.29 (ICD-10-CM) - Chronic bilateral low back pain with bilateral sciatica  Rationale for Evaluation and Treatment: Rehabilitation  THERAPY DIAG:  Chronic bilateral low back pain with bilateral sciatica  ONSET DATE: 7 years  SUBJECTIVE:                                                                                                                                                                                           SUBJECTIVE STATEMENT: 12/18/23 9.5/10 back pain; reports he is maybe 10% better.  He is hurting bad today; he did move some furniture around to mop this weekend and that may have aggravated it.    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 Pt had 7 vertebrae broken and surgery. Pt takes gabapentin for LBP and finds relief with this medication. Pt followed by many disciplines: Neurology for spinal stenosis of cervical, and per patient: a 'brain tumor,' and endrinology for overactive thyroid. Fast, repetitive movements overtime will cause increase balance deficits per patient.   Chart reviewed for brain tumor, where no diagnoses has been made but pt continued to report he has brain tumor.   PERTINENT HISTORY:  CAD Essential HTN Hepatitis C Anxiety Depression Overactive Thryoid  PAIN:  Are you having pain?  Currently 0/10. At worse 10/10.Sharp pain that makes pt sit down. Medication, rest ease pain. Once medication runs out, pain increases or cold weather affects pain.    PRECAUTIONS: None  RED FLAGS: None   WEIGHT BEARING RESTRICTIONS: No  FALLS:  Has patient fallen in last 6 months? No   PATIENT GOALS: "balance is my biggest concern"  OBJECTIVE:  Note: Objective measures were completed at Evaluation unless otherwise noted.  DIAGNOSTIC FINDINGS:  01/13/23 MRI brain 1. Small focus of hyperintense signal on  diffusion weighted imaging in the periatrial white matter on the left, which could represent an acute infarct. Note if there is clinical concern for intracranial metastatic disease, further evaluation with a contrast-enhanced MRI is recommended. 2. Innumerable small foci of microhemorrhage  in the bilateral cerebral and cerebellar hemispheres, which are nonspecific but can be seen in the setting of cerebral amyloid angiopathy. 3. Diffusely T1 hypointense appearance of the cervical spine vertebral bodies, nonspecific, but marrow infiltrative process is not excluded. There is likely also severe spinal canal stenosis at C3-C4. Recommend further evaluation with a cervical spine MRI.  PATIENT SURVEYS:  ABC scale 1230/1600= 76.9% Modified Oswestry 12/50= 40%   COGNITION: Overall cognitive status: Within functional limits for tasks assessed     SENSATION: WFL  POSTURE: increased lumbar lordosis  PALPATION: Tender to palpate along lumbar paraspinals  LUMBAR ROM:   AROM eval  Flexion 100  Extension 50% * and painful  Right lateral flexion 50%  Left lateral flexion 50%  Right rotation   Left rotation    (Blank rows = not tested)  LOWER EXTREMITY ROM:     Active  Right eval Left eval  Hip flexion    Hip extension    Hip abduction    Hip adduction    Hip internal rotation    Hip external rotation    Knee flexion    Knee extension    Ankle dorsiflexion    Ankle plantarflexion    Ankle inversion    Ankle eversion     (Blank rows = not tested)  LOWER EXTREMITY MMT:    MMT Right eval Left eval  Hip flexion    Hip extension    Hip abduction    Hip adduction    Hip internal rotation    Hip external rotation    Knee flexion    Knee extension    Ankle dorsiflexion    Ankle plantarflexion    Ankle inversion    Ankle eversion     (Blank rows = not tested)  FUNCTIONAL TESTS:  30 seconds chair stand test: 10x 2 minute walk test: 252ft Single Leg Balance: R 2  seconds  : unable to perform  GAIT: Distance walked: 27ft Assistive device utilized: None Level of assistance: Complete Independence Comments: bilateral trunk sway with bilateral circumduction of BLE during swing phase and foot flat contact throughout entire gait cycle. Waddling like appearance with shoulder adduction during swing phase.   TREATMENT DATE:  12/18/23 Left arm BP 136/92 Eight 163# Progress note 30 sec sit to stand test 7 x (decline) 2 MWT 327 ft (improvement) SLS Right 10 sec; Left 2 sec (improvement) ABC scale 970/1600 60.6% (slight decline) Modified Oswestry 13/50 26% (improved)  11/30/23 Vital signs monitoring (initial) BP =  94/66, SpO2 = 96%, HR 76 with c/o lightheadedness (10/10 intensity); positioned in supine and BP =  122/80, SpO2 = 93%, HR = 85 Recumbent bike, seat 13, level 1, 5' BP =  122/80, SpO2 = 93%, HR = 85 (after bike) Bridging with hip abd x RTB x 3" x 10 Seated abdominal press with physioball, neutral spine x 3" x 10 x 2 Seated marches x 10 x 2 BP = 139/86, HR = 78, SpO2 = 98% (rates lightheadedness 3/10)  11/28/23:  BP at beginning of session 161/106 mmHg HR 73 3 minutes working on seated posture and deep breathing BP at 145/100 mmHg  STS with GTB around thigh no UE A 10x 2 sets cueing for eccentric control Rows with RTB 15x5"  1:25 reports of slight dizziness requested BP 1298/95 mmHg HR at 80 bpm  Supine: Bridge with theraband around thigh 10x 5" Hamstring stretch with rope 2x 30"  Sidelying: Clam with RTB 10x 5"  BP  at EOS 144/104 mmHg, HR 84  11/13/23 BP at beginning of session 126/94 mmHg HR 78 bpm Seated: sit to stands no UE assist 10X  Scapular retractions 10X5"  Postural education ` Leg extended to floor stretch 3X20" each (too tight long sitting) Supine: Bridge 10X  SLR 10X each  SKTC 3X20" with UE assist  LTR 10X5" Nustep level 7 seat 10 UE/LE 5 minutes ATL beach  11/08/23: BP initially 162/104 mmHg HR at 81 bpm 3  minute deep breathing  BP 146/102 mmHg  Supine: Bridge 2x 10  Gait training to improve heel to toe 45ft  BP at 146/105 mmHg HR at 78  10/31/2023 PT Evaluation                                                                                                                                 PATIENT EDUCATION:  Education details: PT Evaluation, findings, prognosis, frequency, attendance policy, and HEP if given.  Person educated: Patient Education method: Medical illustrator Education comprehension: verbalized understanding  HOME EXERCISE PROGRAM: Access Code: [email or web address] URL: [email or web address] Date: 11/08/2023 Prepared by: Becky Sax  Exercises - Supine Bridge  - 1-2 x daily - 7 x weekly - 3 sets - 10 reps - 5" hold  ASSESSMENT:  CLINICAL IMPRESSION: Progress note today.  He has made progress with some functional tests and surveys but decline with others.  He would like to hold off of further therapy at this time so he can focus on his other health issues.     Eval:  Patient is a 68 y.o. male who was seen today for physical therapy evaluation and treatment for M54.42,M54.41,G89.29 (ICD-10-CM) - Chronic bilateral low back pain with bilateral sciatica. Pt with multiple system involvement which overlaps with chronic low back pain. Pt has involvement of cervical spine stenosis, pt potentially has brain cancer, per patient, as well is followed by endocrinology and other providers, increasing pt's complexity evaluation due to multiple sources of body system overlapping pt's objective impairments. Pt demonstrating significant limitations in functional mobility, ADLs, transfers and safety due to muscle weakness, balance deficits, ROM restrictions and chronic pain. Pt will benefit from skilled Physical Therapy services to address deficits/limitations in order to improve functional and QOL.    OBJECTIVE IMPAIRMENTS: Abnormal gait, decreased activity tolerance,  decreased balance, decreased mobility, decreased ROM, decreased strength, dizziness, postural dysfunction, and pain.   ACTIVITY LIMITATIONS: carrying, lifting, bending, standing, transfers, and locomotion level  PARTICIPATION LIMITATIONS: community activity and occupation  PERSONAL FACTORS: Age and 3+ comorbidities: see above for details  are also affecting patient's functional outcome.   REHAB POTENTIAL: Fair increased complexity and pending results  CLINICAL DECISION MAKING: Unstable/unpredictable  EVALUATION COMPLEXITY: High   GOALS: Goals reviewed with patient? No  SHORT TERM GOALS: Target date: 11/21/2023  Pt will be independent with HEP in order to demonstrate participation in Physical Therapy POC.  Baseline: Goal status: MET   LONG TERM  GOALS: Target date: 12/12/2023  Pt will improve 30 Second Chair Stand Test by >4 in order to demonstrate improved functional strength to return to desired activities.  Baseline:  See objective. 12/18/23 7x  Goal status: IN PROGRESS  2.  Pt will improve 2 MWT by 149ft in order to demonstrate improved functional ambulatory capacity in community setting.  Baseline: See objective. 327 ft  12/18/23 Goal status: IN PROGRESS  3.  Pt will improve Modified Oswestry score by 5 points in order to demonstrate improved pain with functional goals and outcomes. Baseline: See objective.  Goal status: IN PROGRESS  4.  Pt will improve ABC scale by 5 points in order to demonstrate improved functional balance reporting to enhance pt's safety.  Baseline: See objective.  Goal status: IN PROGRESS  PLAN:  PT FREQUENCY: 2x/week  PT DURATION: 6 weeks  PLANNED INTERVENTIONS: 97164- PT Re-evaluation, 97110-Therapeutic exercises, 97530- Therapeutic activity, 97112- Neuromuscular re-education, 97535- Self Care, 04540- Manual therapy, [id number]- Gait training, 97014- Electrical stimulation (unattended), [id number]- Electrical stimulation (manual), [id number]- Traction  (mechanical), Patient/Family education, Balance training, Stair training, Cryotherapy, and Moist heat.  PLAN FOR NEXT SESSION: discharge   12:26 PM, 12/18/23 Larysa Pall Small Hermine Feria MPT [place] [facility] [place] [phone]

## 2023-12-27 ENCOUNTER — Other Ambulatory Visit: Payer: Self-pay | Admitting: *Deleted

## 2023-12-27 NOTE — Patient Outreach (Signed)
 Medicaid Managed Care   Nurse Care Manager Note  12/27/2023 Name:  Kirk Oconnor MRN:  [id number] DOB:  [birth year]  Kirk Oconnor is an 68 y.o. year old male who is a primary patient of Del Nigel Berthold, FNP.  The [facility] Managed Care Coordination team was consulted for assistance with:    HTN Thyroid  Kirk Oconnor was given information about Medicaid Managed Care Coordination team services today. Kirk Oconnor Patient agreed to services and verbal consent obtained.  Engaged with patient by telephone for follow up visit in response to provider referral for case management and/or care coordination services.   Patient is participating in a Managed Medicaid Plan:  Yes  Assessments/Interventions:  Review of past medical history, allergies, medications, health status, including review of consultants reports, laboratory and other test data, was performed as part of comprehensive evaluation and provision of chronic care management services.  SDOH (Social Drivers of Health) assessments and interventions performed: SDOH Interventions    Flowsheet Row Patient Outreach Telephone from 11/27/2023 in [place] [facility]PART[facility] Patient Outreach Telephone from 08/18/2023 in [place] [facility] Office Visit from 07/12/2023 in [facility] Patient Outreach Telephone from 03/21/2023 in [place] [facility]PART[facility] Office Visit from 03/01/2023 in [facility] [place] Office Visit from 01/20/2023 in [facility]  SDOH Interventions        Food Insecurity Interventions Intervention Not Indicated Intervention Not Indicated -- Intervention Not Indicated -- --  Housing Interventions Intervention Not Indicated Other (Comment)  [BSW referral] -- Other (Comment)  [Patient is scheduled with BSW on 03/23/23] AMB Referral --  Transportation Interventions Intervention Not Indicated Intervention Not Indicated --  Intervention Not Indicated -- --  Utilities Interventions Intervention Not Indicated Intervention Not Indicated -- Intervention Not Indicated -- --  Depression Interventions/Treatment  -- -- Medication, Counseling -- -- Counseling       Care Plan  No Known Allergies  Medications Reviewed Today   Medications were not reviewed in this encounter     Patient Active Problem List   Diagnosis Date Noted   Spinal stenosis in cervical region 10/19/2023   Abnormal urine odor 10/19/2023   Depression, major, single episode, moderate (HCC) 07/12/2023   Hypersomnolence 06/19/2023   Dizziness 06/06/2023   Left anterior fascicular block (LAFB) 06/06/2023   Lumbar pain 04/25/2023   Hypertension 04/25/2023   Current smoker 04/05/2023   Hyponatremia 03/27/2023   Hyperthyroidism 03/27/2023   Erectile dysfunction 02/28/2023   Type 2 diabetes mellitus (HCC) 01/20/2023   Nocturia 01/18/2023   Unexplained weight loss 01/18/2023   Hepatic cirrhosis (HCC) 07/20/2022   Hyperglycemia 07/11/2017   Thrombocytopenia (HCC) 07/11/2017   Chronic back pain 06/26/2017   COPD (chronic obstructive pulmonary disease) (HCC) 04/14/2017   CVA (cerebral vascular accident) (HCC) 04/14/2017   History of alcohol abuse 04/14/2017   History of cocaine abuse (HCC) 04/14/2017   OSA (obstructive sleep apnea) 04/14/2017   History of tobacco abuse 04/14/2017   BPH (benign prostatic hyperplasia) 10/22/2016   GERD (gastroesophageal reflux disease) 10/22/2016   Aortic root dilation (HCC) 10/21/2016   Acute diastolic CHF (congestive heart failure) (HCC) 10/21/2016   Essential hypertension, benign 03/27/2013   Hepatitis C 03/27/2013   Snoring 03/27/2013    Conditions to be addressed/monitored per PCP order:  HTN and Thyroid  Care Plan : RN Care Manager Plan of Care  Updates made by Heidi Dach, RN since 12/27/2023 12:00 AM  Problem: Health Management needs related to Chronic Pain      Long-Range Goal:  Development of Plan of Care to address Health Management needs related to Thyroid Disorder   Start Date: 03/21/2023  Expected End Date: 01/23/2024  Note:   Current Barriers:  Chronic Disease Management support and education needs related to HTN and Thyroid Disorder and HTN     RNCM Clinical Goal(s):  Patient will verbalize understanding of plan for management of Chronic Pain as evidenced by patient reports take all medications exactly as prescribed and will call provider for medication related questions as evidenced by patient reports    attend all scheduled medical appointments:  Urology on 12/29/23, PCP on 01/17/24 and Dr. Fransico Him on 02/05/24 as evidenced by provider documentation in EMR        continue to work with RN Care Manager and/or Social Worker to address care management and care coordination needs related to chronic pain as evidenced by adherence to CM Team Scheduled appointments        Interventions: Inter-disciplinary care team collaboration (see longitudinal plan of care) Evaluation of current treatment plan related to  self management and patient's adherence to plan as established by provider Provided patient with contact information for [facility] [phone], advised patient to call and reschedule missed appointment-revisited Discussed warm transfer to new RNCM, scheduled on 01/23/24  Thyroid Disorder  (Status:  Goal on track:  Yes.)  Long Term Goal Evaluation of current treatment plan related to  Thyroid Disorder ,  self-management and patient's adherence to plan as established by provider. Discussed plans with patient for ongoing care management follow up and provided patient with direct contact information for care management team Reviewed medications with patient and discussed -patient declined Medication review today Reviewed scheduled/upcoming provider appointments including see list above Assessed social determinant of health barriers Encouraged patient to think positive  thoughts, eat a healthy diet, exercise and get outside everyday Advised patient to discuss concerns with provider Provided therapeutic listening   Hypertension Interventions:  (Status:  Goal on track:  Yes.) Long Term Goal Last practice recorded BP readings:  BP Readings from Last 3 Encounters:  12/01/23 112/80  11/15/23 (!) 140/97  11/10/23 106/71   Most recent eGFR/CrCl:  Lab Results  Component Value Date   EGFR 91 08/31/2023    No components found for: "CRCL"  Evaluation of current treatment plan related to hypertension self management and patient's adherence to plan as established by provider Discussed plans with patient for ongoing care management follow up and provided patient with direct contact information for care management team Reviewed scheduled/upcoming provider appointments including: see list above Advised patient to exercise 30 minutes daily-patient has joined [employer] and exercising 3-4 days a week Advised patient to focus on what he can do and not what he can't do Advised patient to keep a journal and discuss any concerns or questions with Provider Advised patient to check BP and record, take record to next PCP visit on 01/17/24 Provided education on diet and managing HTN Patient declined medication review today, advised patient to take all medications to upcoming appointment with PCP   Patient Goals/Self-Care Activities: Take medications as prescribed   Attend all scheduled provider appointments Call provider office for new concerns or questions  Work with the social worker to address care coordination needs and will continue to work with the clinical team to address health care and disease management related needs       Follow Up:  Patient agrees to  Care Plan and Follow-up.  Plan: The Managed Medicaid care management team will reach out to the patient again over the next 30 days.  Date/time of next scheduled RN care management/care coordination  outreach:  01/23/24 at 2pm  [name] RN, BSN [place]  [facility] RN Care Manager [phone]

## 2023-12-27 NOTE — Patient Instructions (Signed)
 Visit Information  Kirk Oconnor was given information about Medicaid Managed Care team care coordination services as a part of their [facility] Community Plan Medicaid benefit. Kirk Oconnor verbally consented to engagement with the [facility] Managed Care team.   If you are experiencing a medical emergency, please call 911 or report to your local emergency department or urgent care.   If you have a non-emergency medical problem during routine business hours, please contact your provider's office and ask to speak with a nurse.   For questions related to your [facility], please call: [phone] or visit the homepage here: [email or web address]  If you would like to schedule transportation through your [facility], please call the following number at least 2 days in advance of your appointment: [phone]   Rides for urgent appointments can also be made after hours by calling Member Services.  Call the Behavioral Health Crisis Line at [phone] [phone], at any time, 24 hours a day, 7 days a week. If you are in danger or need immediate medical attention call 911.  If you would like help to quit smoking, call 1-800-QUIT-NOW (9162790819) OR Espaol: 1-855-Djelo-Ya (1-324-401-0272) o para ms informacin haga clic aqu or Text READY to 536-644 to register via text  Mr. Kirk Oconnor,   Please see education materials related to HTN provided by MyChart link.  Patient verbalizes understanding of instructions and care plan provided today and agrees to view in MyChart. Active MyChart status and patient understanding of how to access instructions and care plan via MyChart confirmed with patient.     Telephone follow up appointment with Managed Medicaid care management team member scheduled for:01/23/24 at 2pm  Kirk Emms RN, BSN [place]  [facility] RN Care  Manager [phone]   Following is a copy of your plan of care:  Care Plan : RN Care Manager Plan of Care  Updates made by Heidi Dach, RN since 12/27/2023 12:00 AM     Problem: Health Management needs related to Chronic Pain      Long-Range Goal: Development of Plan of Care to address Health Management needs related to Thyroid Disorder   Start Date: 03/21/2023  Expected End Date: 01/23/2024  Note:   Current Barriers:  Chronic Disease Management support and education needs related to HTN and Thyroid Disorder and HTN     RNCM Clinical Goal(s):  Patient will verbalize understanding of plan for management of Chronic Pain as evidenced by patient reports take all medications exactly as prescribed and will call provider for medication related questions as evidenced by patient reports    attend all scheduled medical appointments:  Urology on 12/29/23, PCP on 01/17/24 and Dr. Fransico Him on 02/05/24 as evidenced by provider documentation in EMR        continue to work with RN Care Manager and/or Social Worker to address care management and care coordination needs related to chronic pain as evidenced by adherence to CM Team Scheduled appointments        Interventions: Inter-disciplinary care team collaboration (see longitudinal plan of care) Evaluation of current treatment plan related to  self management and patient's adherence to plan as established by provider Provided patient with contact information for [facility] [phone], advised patient to call and reschedule missed appointment-revisited Discussed warm transfer to new [facility], scheduled on 01/23/24  Thyroid Disorder  (Status:  Goal on track:  Yes.)  Long Term Goal Evaluation of current treatment plan related to  Thyroid Disorder ,  self-management and patient's adherence to plan as established by provider. Discussed plans with patient for ongoing care management follow up and provided patient with direct contact information for care management  team Reviewed medications with patient and discussed -patient declined Medication review today Reviewed scheduled/upcoming provider appointments including see list above Assessed social determinant of health barriers Encouraged patient to think positive thoughts, eat a healthy diet, exercise and get outside everyday Advised patient to discuss concerns with provider Provided therapeutic listening   Hypertension Interventions:  (Status:  Goal on track:  Yes.) Long Term Goal Last practice recorded BP readings:  BP Readings from Last 3 Encounters:  12/01/23 112/80  11/15/23 (!) 140/97  11/10/23 106/71   Most recent eGFR/CrCl:  Lab Results  Component Value Date   EGFR 91 08/31/2023    No components found for: "CRCL"  Evaluation of current treatment plan related to hypertension self management and patient's adherence to plan as established by provider Discussed plans with patient for ongoing care management follow up and provided patient with direct contact information for care management team Reviewed scheduled/upcoming provider appointments including: see list above Advised patient to exercise 30 minutes daily-patient has joined [employer] and exercising 3-4 days a week Advised patient to focus on what he can do and not what he can't do Advised patient to keep a journal and discuss any concerns or questions with Provider Advised patient to check BP and record, take record to next PCP visit on 01/17/24 Provided education on diet and managing HTN Patient declined medication review today, advised patient to take all medications to upcoming appointment with PCP   Patient Goals/Self-Care Activities: Take medications as prescribed   Attend all scheduled provider appointments Call provider office for new concerns or questions  Work with the social worker to address care coordination needs and will continue to work with the clinical team to address health care and disease management  related needs

## 2023-12-28 ENCOUNTER — Other Ambulatory Visit: Payer: Self-pay | Admitting: Family Medicine

## 2023-12-29 ENCOUNTER — Ambulatory Visit: Payer: 59 | Admitting: Urology

## 2023-12-29 ENCOUNTER — Encounter: Payer: Self-pay | Admitting: Urology

## 2023-12-29 VITALS — BP 147/96 | HR 86 | Wt 163.0 lb

## 2023-12-29 DIAGNOSIS — N529 Male erectile dysfunction, unspecified: Secondary | ICD-10-CM

## 2023-12-29 MED ORDER — SILDENAFIL CITRATE 100 MG PO TABS: 100.0000 mg | ORAL_TABLET | ORAL | 5 refills | Status: DC | PRN

## 2023-12-29 NOTE — Progress Notes (Signed)
 12/29/2023 11:47 AM   Kirk Oconnor 1956/06/17 [id number]  Referring provider: Rosanna Comment, FNP [phone] [address] [place],  [place] [address]  Followup erectile dysfunction   HPI: Kirk Oconnor is a 67yo here for followup for erectile dysfunction. He tried sildenafil  20mg  daily and 50-100mg  prn with poor results. Good libido. 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   Arthritis    CAD (coronary artery disease)    Nonobstructive at cardiac catheterization July 2018 - [facility]   Carpal tunnel syndrome    Depression    Diabetes mellitus without complication (HCC)    Essential hypertension    GERD (gastroesophageal reflux disease)    Hepatitis C    Sleep apnea     Surgical History: Past Surgical History:  Procedure Laterality Date   AORTIC VALVE REPLACEMENT  03/2017   [facility]   BIOPSY  12/15/2022   Procedure: BIOPSY;  Surgeon: Vinetta Greening, DO;  [facility];  Service: Endoscopy;;   COLONOSCOPY WITH PROPOFOL  N/A 12/15/2022   Procedure: COLONOSCOPY WITH PROPOFOL ;  Surgeon: Vinetta Greening, DO;  [facility];  Service: Endoscopy;  Laterality: N/A;  10:00 am, asa 3   ESOPHAGOGASTRODUODENOSCOPY (EGD) WITH PROPOFOL  N/A 12/15/2022   Procedure: ESOPHAGOGASTRODUODENOSCOPY (EGD) WITH PROPOFOL ;  Surgeon: Vinetta Greening, DO;  Location[facility];  Service: Endoscopy;  Laterality: N/A;   POLYPECTOMY  12/15/2022   Procedure: POLYPECTOMY;  Surgeon: Vinetta Greening, DO;  Location: [facility];  Service: Endoscopy;;    Home Medications:  Allergies as of 12/29/2023   No Known Allergies      Medication List        Accurate as of December 29, 2023 11:47  AM. If you have any questions, ask your nurse or doctor.          acetaminophen  500 MG tablet Commonly known as: TYLENOL  Take 500 mg by mouth every 6 (six) hours as needed.   AMBULATORY NON FORMULARY MEDICATION Vacuum erection device. Dispense 1.   amLODipine  10 MG tablet Commonly known as: NORVASC  Take 1 tablet (10 mg total) by mouth daily.   aspirin  EC 81 MG tablet Take 81 mg by mouth daily.   Blood Pressure Kit Devi Take blood pressure reading once daily   Blood Pressure Monitor Devi 1 each by Does not apply route daily.   fluconazole  150 MG tablet Commonly known as: DIFLUCAN  Take two tablets by mouth on day 1. Repeat dose with 2 tablets by mouth on day 8 (1 week after 1st dose).   gabapentin  300 MG capsule Commonly known as: NEURONTIN  Take 1 capsule (300 mg total) by mouth every 8 (eight) hours as needed.   ketoconazole 2 % cream Commonly known as: NIZORAL Apply 1 Application topically 2 (two) times daily.   ketoconazole 2 % shampoo Commonly known as: NIZORAL Apply 1 Application topically once.   Levothyroxine Sodium 125 MCG Caps Use as directed 125 mcg in the mouth or throat daily.   losartan  25 MG tablet Commonly known as: COZAAR  Take 1 tablet (25 mg total) by mouth daily.   meclizine  25 MG tablet Commonly known as: ANTIVERT  Take 1  tablet (25 mg total) by mouth 2 (two) times daily as needed for dizziness.   metFORMIN  500 MG tablet Commonly known as: GLUCOPHAGE  Take 1 tablet by mouth once daily with breakfast   methimazole  5 MG tablet Commonly known as: TAPAZOLE  Take 1 tablet by mouth once daily with breakfast   omeprazole  40 MG capsule Commonly known as: PRILOSEC Take 1 capsule (40 mg total) by mouth 2 (two) times daily.   pregabalin  50 MG capsule Commonly known as: Lyrica  Take 1 capsule (50 mg total) by mouth 2 (two) times daily.   rosuvastatin  5 MG tablet Commonly known as: CRESTOR  Take 5 mg by mouth daily.   sildenafil  100 MG  tablet Commonly known as: VIAGRA  Take 1 tablet (100 mg total) by mouth as needed for erectile dysfunction.   sildenafil  20 MG tablet Commonly known as: REVATIO  Take 1 tablet (20 mg total) by mouth daily.   tiZANidine  4 MG tablet Commonly known as: ZANAFLEX  TAKE 1 TABLET BY MOUTH EVERY 12 HOURS AS NEEDED FOR MUSCLE SPASM   traMADol  50 MG tablet Commonly known as: ULTRAM  Take 1 tablet (50 mg total) by mouth daily as needed.        Allergies: No Known Allergies  Family History: Family History  Problem Relation Age of Onset   Diabetes Mother    Heart attack Mother    Heart attack Father     Social History:  reports that he has been smoking cigarettes. He has never used smokeless tobacco. He reports current alcohol use. He reports that he does not use drugs.  ROS: All other review of systems were reviewed and are negative except what is noted above in HPI  Physical Exam: BP (!) 147/96   Pulse 86   Constitutional:  Alert and oriented, No acute distress. HEENT: [place] AT, moist mucus membranes.  Trachea midline, no masses. Cardiovascular: No clubbing, cyanosis, or edema. Respiratory: Normal respiratory effort, no increased work of breathing. GI: Abdomen is soft, nontender, nondistended, no abdominal masses GU: No CVA tenderness.  Lymph: No cervical or inguinal lymphadenopathy. Skin: No rashes, bruises or suspicious lesions. Neurologic: Grossly intact, no focal deficits, moving all 4 extremities. Psychiatric: Normal mood and affect.  Laboratory Data: Lab Results  Component Value Date   WBC 7.0 06/19/2023   HGB 14.7 06/19/2023   HCT 44.5 06/19/2023   MCV 89 06/19/2023   PLT 196 06/19/2023    Lab Results  Component Value Date   CREATININE 0.80 09/29/2023    Lab Results  Component Value Date   PSA 1.68 03/11/2013    Lab Results  Component Value Date   TESTOSTERONE  706 06/19/2023    Lab Results  Component Value Date   HGBA1C 6.6 (H) 10/19/2023     Urinalysis    Component Value Date/Time   COLORURINE YELLOW 01/13/2023 1244   APPEARANCEUR Clear 10/19/2023 1025   LABSPEC 1.021 01/13/2023 1244   PHURINE 6.0 01/13/2023 1244   GLUCOSEU Negative 10/19/2023 1025   HGBUR NEGATIVE 01/13/2023 1244   BILIRUBINUR Negative 10/19/2023 1025   KETONESUR NEGATIVE 01/13/2023 1244   PROTEINUR Negative 10/19/2023 1025   PROTEINUR NEGATIVE 01/13/2023 1244   UROBILINOGEN 1.0 11/30/2011 1910   NITRITE Negative 10/19/2023 1025   NITRITE NEGATIVE 01/13/2023 1244   LEUKOCYTESUR Negative 10/19/2023 1025   LEUKOCYTESUR NEGATIVE 01/13/2023 1244    Lab Results  Component Value Date   LABMICR Comment 08/31/2023    Pertinent Imaging:  No results found for this or any previous visit.  No  results found for this or any previous visit.  No results found for this or any previous visit.  No results found for this or any previous visit.  No results found for this or any previous visit.  No results found for this or any previous visit.  No results found for this or any previous visit.  No results found for this or any previous visit.   Assessment & Plan:    1. Erectile dysfunction, unspecified erectile dysfunction type (Primary) -continue sildenafil  100mg  prn -referral to Dr. Jarvis Mesa for consideration of IPP   No follow-ups on file.  Johnie Nailer, MD  [facility] [place]

## 2024-01-01 ENCOUNTER — Ambulatory Visit: Payer: Self-pay

## 2024-01-01 NOTE — Telephone Encounter (Signed)
  Chief Complaint: dizziness Symptoms: lightheaded/off balance, fatigue/drowsy, sweating, numbness in bilateral hands and feet Frequency: x 4-5 days Pertinent Negatives: Patient denies chest pain, SOB, nausea, vomiting, missed doses of BP  meds, changes in speech or vision, unilateral numbness or weakness. Disposition: [] ED /[] Urgent Care (no appt availability in office) / [x] Appointment(In office/virtual)/ []  [facility]/ [] Home Care/ [] Refused Recommended Disposition /[] [facility]/ []  Follow-up with PCP Additional Notes: Patient reports he has lost 40 pounds unintentionally over the last 6 months. He states he doesn't remember what his BP was but it was around 100-something over 90-something. Patient states he would like to get his thyroid checked. Patient agreeable to acute visit tomorrow with available provider. Patient states he has to set up transportation. Advised he call back if unable to establish transportation. Patient educated on stroke symptoms.  Copied from CRM [id number]. Topic: Clinical - Red Word Triage >> Jan 01, 2024  1:41 PM Carla L wrote: [facility] that prompted transfer to Nurse Triage: Losing weight, balance is off, blood pressure rising unknown number Reason for Disposition  [1] MODERATE dizziness (e.g., interferes with normal activities) AND [2] has NOT been evaluated by doctor (or NP/PA) for this  (Exception: Dizziness caused by heat exposure, sudden standing, or poor fluid intake.)  Answer Assessment - Initial Assessment Questions 1. DESCRIPTION: "Describe your dizziness."     Unsteady/ off balance.  2. LIGHTHEADED: "Do you feel lightheaded?" (e.g., somewhat faint, woozy, weak upon standing)     Yes.  3. VERTIGO: "Do you feel like either you or the room is spinning or tilting?" (i.e. vertigo)     Denies.  4. SEVERITY: "How bad is it?"  "Do you feel like you are going to faint?" "Can you stand and walk?"   - MILD: Feels slightly dizzy, but walking  normally.   - MODERATE: Feels unsteady when walking, but not falling; interferes with normal activities (e.g., school, work).   - SEVERE: Unable to walk without falling, or requires assistance to walk without falling; feels like passing out now.      He states at times it can be moderate and having to hold onto things for balance.  5. ONSET:  "When did the dizziness begin?"     X 4-5 days.  6. AGGRAVATING FACTORS: "Does anything make it worse?" (e.g., standing, change in head position)     Denies.  7. HEART RATE: "Can you tell me your heart rate?" "How many beats in 15 seconds?"  (Note: not all patients can do this)       Denies.  8. CAUSE: "What do you think is causing the dizziness?"     Unsure.  9. RECURRENT SYMPTOM: "Have you had dizziness before?" If Yes, ask: "When was the last time?" "What happened that time?"     He states he has had vertigo in the past but it felt different than this.  10. OTHER SYMPTOMS: "Do you have any other symptoms?" (e.g., fever, chest pain, vomiting, diarrhea, bleeding)       Sweating, drowsy/fatigue.  11. PREGNANCY: "Is there any chance you are pregnant?" "When was your last menstrual period?"       N/A.  Protocols used: Dizziness - Lightheadedness-A-AH

## 2024-01-02 ENCOUNTER — Ambulatory Visit: Admitting: Internal Medicine

## 2024-01-02 ENCOUNTER — Encounter: Payer: Self-pay | Admitting: Internal Medicine

## 2024-01-02 VITALS — BP 134/90 | HR 81 | Ht 66.0 in | Wt 163.0 lb

## 2024-01-02 DIAGNOSIS — E1159 Type 2 diabetes mellitus with other circulatory complications: Secondary | ICD-10-CM | POA: Diagnosis not present

## 2024-01-02 DIAGNOSIS — E119 Type 2 diabetes mellitus without complications: Secondary | ICD-10-CM

## 2024-01-02 DIAGNOSIS — Z7984 Long term (current) use of oral hypoglycemic drugs: Secondary | ICD-10-CM

## 2024-01-02 DIAGNOSIS — I10 Essential (primary) hypertension: Secondary | ICD-10-CM

## 2024-01-02 DIAGNOSIS — E0590 Thyrotoxicosis, unspecified without thyrotoxic crisis or storm: Secondary | ICD-10-CM

## 2024-01-02 DIAGNOSIS — R630 Anorexia: Secondary | ICD-10-CM | POA: Diagnosis not present

## 2024-01-02 LAB — GLUCOSE, POCT (MANUAL RESULT ENTRY): POC Glucose: 115 mg/dL — AB (ref 70–99)

## 2024-01-02 MED ORDER — LOSARTAN POTASSIUM 50 MG PO TABS: 50.0000 mg | ORAL_TABLET | Freq: Every day | ORAL | 1 refills | Status: DC

## 2024-01-02 MED ORDER — MIRTAZAPINE 7.5 MG PO TABS: 7.5000 mg | ORAL_TABLET | Freq: Every day | ORAL | 0 refills | Status: AC

## 2024-01-02 MED ORDER — LANCETS MISC. MISC
1.0000 | Freq: Three times a day (TID) | 1 refills | Status: DC
Start: 2024-01-02 — End: 2024-03-22

## 2024-01-02 MED ORDER — LANCET DEVICE MISC: 1.0000 | Freq: Three times a day (TID) | 0 refills | Status: AC

## 2024-01-02 MED ORDER — BLOOD GLUCOSE MONITORING SUPPL DEVI: 1.0000 | Freq: Three times a day (TID) | 0 refills | Status: AC

## 2024-01-02 MED ORDER — BLOOD GLUCOSE TEST VI STRP: 1.0000 | ORAL_STRIP | Freq: Three times a day (TID) | 1 refills | Status: DC

## 2024-01-02 NOTE — Assessment & Plan Note (Addendum)
 BP Readings from Last 1 Encounters:  01/02/24 130/86   Uncontrolled with amlodipine 10 mg QD and losartan 25 mg QD Increased dose of losartan to 50 mg QD Counseled for compliance with the medications Advised DASH diet and moderate exercise/walking, at least 150 mins/week

## 2024-01-02 NOTE — Progress Notes (Signed)
 Acute Office Visit  Subjective:    Patient ID: Kirk Oconnor, male    DOB: Dec 12, 1955, 68 y.o.   MRN: [id number]  Chief Complaint  Patient presents with   Hypertension    Pt reports elevated bp reading and thyroid levels have been off.     HPI Patient is in today for concern of persistently elevated BP.  He is currently taking amlodipine 10 mg QD and losartan 25 mg QD.  He has generalized headache, but denies any chest pain, dyspnea or palpitations.  He has been recently diagnosed with hyperthyroidism and has started treatment with methimazole, followed by Dr. Fransico Him. He is worried about his weight loss. He reports loss of appetite and decrease in his endurance.  He request some medicine to improve his appetite.  Type II DM: He is concerned about his type II DM.  His HbA1c was 6.6 in 01/25.  He has chronic fatigue, but denies any polyuria or polyphagia.  He takes metformin 500 mg QD currently.  He requests glucometer and supplies prescription.    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   Arthritis    CAD (coronary artery disease)    Nonobstructive at cardiac catheterization July 2018 - [facility]   Carpal tunnel syndrome    Depression    Diabetes mellitus without complication (HCC)    Essential hypertension    GERD (gastroesophageal reflux disease)    Hepatitis C    Sleep apnea     Past Surgical History:  Procedure Laterality Date   AORTIC VALVE REPLACEMENT  03/2017   [facility]   BIOPSY  12/15/2022   Procedure: BIOPSY;  Surgeon: Lanelle Bal, DO;  [facility]: [facility];  Service: Endoscopy;;   COLONOSCOPY WITH PROPOFOL N/A 12/15/2022   Procedure: COLONOSCOPY WITH PROPOFOL;  Surgeon: Lanelle Bal, DO;  Location:  [facility];  Service: Endoscopy;  Laterality: N/A;  10:00 am, asa 3   ESOPHAGOGASTRODUODENOSCOPY (EGD) WITH PROPOFOL N/A 12/15/2022   Procedure: ESOPHAGOGASTRODUODENOSCOPY (EGD) WITH PROPOFOL;  Surgeon: Lanelle Bal, DO;  Location: [facility];  Service: Endoscopy;  Laterality: N/A;   POLYPECTOMY  12/15/2022   Procedure: POLYPECTOMY;  Surgeon: Lanelle Bal, DO;  Location: [facility];  Service: Endoscopy;;    Family History  Problem Relation Age of Onset   Diabetes Mother    Heart attack Mother    Heart attack Father     Social History   Socioeconomic History   Marital status: Divorced    Spouse name: Not on file   Number of children: Not on file   Years of education: Not on file   Highest education level: Not on file  Occupational History   Not on file  Tobacco Use   Smoking status: Some Days    Types: Cigarettes   Smokeless tobacco: Never  Vaping Use   Vaping status: Never Used  Substance and Sexual Activity   Alcohol use: Yes    Comment: socially   Drug use: No   Sexual activity: Yes  Other Topics Concern   Not on file  Social History Narrative   Not on file   Social Drivers of Health   Financial Resource Strain: Low Risk  (  08/22/2023)   Overall Financial Resource Strain (CARDIA)    Difficulty of Paying Living Expenses: Not very hard  Food Insecurity: No Food Insecurity (11/27/2023)   Hunger Vital Sign    Worried About Running Out of Food in the Last Year: Never true    Ran Out of Food in the Last Year: Never true  Transportation Needs: No Transportation Needs (11/27/2023)   PRAPARE - Administrator, Civil Service (Medical): No    Lack of Transportation (Non-Medical): No  Physical Activity: Not on file  Stress: Not on file  Social Connections: Not on file  Intimate Partner Violence: Not At Risk (11/27/2023)   Humiliation, Afraid, Rape, and Kick questionnaire    Fear of Current or Ex-Partner: No    Emotionally Abused: No    Physically  Abused: No    Sexually Abused: No    Outpatient Medications Prior to Visit  Medication Sig Dispense Refill   acetaminophen (TYLENOL) 500 MG tablet Take 500 mg by mouth every 6 (six) hours as needed.     AMBULATORY NON FORMULARY MEDICATION Vacuum erection device. Dispense 1. 1 each 0   amLODipine (NORVASC) 10 MG tablet Take 1 tablet (10 mg total) by mouth daily. 30 tablet 3   aspirin EC 81 MG tablet Take 81 mg by mouth daily.     Blood Pressure Monitor DEVI 1 each by Does not apply route daily. 1 each 0   Blood Pressure Monitoring (BLOOD PRESSURE KIT) DEVI Take blood pressure reading once daily 1 each 0   gabapentin (NEURONTIN) 300 MG capsule Take 1 capsule (300 mg total) by mouth every 8 (eight) hours as needed. 90 capsule 3   ketoconazole (NIZORAL) 2 % cream Apply 1 Application topically 2 (two) times daily.     meclizine (ANTIVERT) 25 MG tablet Take 1 tablet (25 mg total) by mouth 2 (two) times daily as needed for dizziness. 30 tablet 0   metFORMIN (GLUCOPHAGE) 500 MG tablet Take 1 tablet by mouth once daily with breakfast 90 tablet 0   methimazole (TAPAZOLE) 5 MG tablet Take 1 tablet by mouth once daily with breakfast 90 tablet 0   pregabalin (LYRICA) 50 MG capsule Take 1 capsule (50 mg total) by mouth 2 (two) times daily. 60 capsule 2   rosuvastatin (CRESTOR) 5 MG tablet Take 5 mg by mouth daily.     sildenafil (REVATIO) 20 MG tablet Take 1 tablet (20 mg total) by mouth daily. 30 tablet 5   sildenafil (VIAGRA) 100 MG tablet Take 1 tablet (100 mg total) by mouth as needed for erectile dysfunction. 30 tablet 5   tiZANidine (ZANAFLEX) 4 MG tablet TAKE 1 TABLET BY MOUTH EVERY 12 HOURS AS NEEDED FOR MUSCLE SPASM 60 tablet 2   traMADol (ULTRAM) 50 MG tablet Take 1 tablet (50 mg total) by mouth daily as needed. 30 tablet 0   fluconazole (DIFLUCAN) 150 MG tablet Take two tablets by mouth on day 1. Repeat dose with 2 tablets by mouth on day 8 (1 week after 1st dose). 4 tablet 0   ketoconazole  (NIZORAL) 2 % shampoo Apply 1 Application topically once.     Levothyroxine Sodium 125 MCG CAPS Use as directed 125 mcg in the mouth or throat daily.     losartan (COZAAR) 25 MG tablet Take 1 tablet (25 mg total) by mouth daily. 90 tablet 1   omeprazole (PRILOSEC) 40 MG capsule Take 1 capsule (40 mg total) by mouth 2 (two) times daily. 60 capsule  11   methylPREDNISolone acetate (DEPO-MEDROL) injection 40 mg      No facility-administered medications prior to visit.    No Known Allergies  Review of Systems  Constitutional:  Positive for appetite change, fatigue and unexpected weight change. Negative for chills and fever.  HENT:  Negative for congestion and sore throat.   Eyes:  Negative for pain and discharge.  Respiratory:  Negative for cough and shortness of breath.   Cardiovascular:  Negative for chest pain and palpitations.  Gastrointestinal:  Negative for diarrhea, nausea and vomiting.  Endocrine: Negative for polydipsia and polyuria.  Genitourinary:  Negative for dysuria and hematuria.  Musculoskeletal:  Negative for neck pain and neck stiffness.  Skin:  Negative for rash.  Neurological:  Positive for headaches. Negative for weakness and numbness.  Psychiatric/Behavioral:  Negative for agitation and behavioral problems.        Objective:    Physical Exam Vitals reviewed.  Constitutional:      General: He is not in acute distress.    Appearance: He is not diaphoretic.  HENT:     Head: Normocephalic and atraumatic.  Eyes:     General: No scleral icterus.    Extraocular Movements: Extraocular movements intact.  Cardiovascular:     Rate and Rhythm: Normal rate and regular rhythm.     Heart sounds: Normal heart sounds. No murmur heard. Pulmonary:     Breath sounds: Normal breath sounds. No wheezing or rales.  Musculoskeletal:     Cervical back: Neck supple. No tenderness.     Right lower leg: No edema.     Left lower leg: No edema.  Skin:    General: Skin is warm.      Findings: No rash.  Neurological:     General: No focal deficit present.     Mental Status: He is oriented to person, place, and time.  Psychiatric:        Mood and Affect: Mood normal.        Behavior: Behavior normal. Behavior is cooperative.     BP (!) 134/90 (BP Location: Left Arm)   Pulse 81   Ht 5\' 6"  (1.676 m)   Wt 163 lb (73.9 kg)   SpO2 97%   BMI 26.31 kg/m  Wt Readings from Last 3 Encounters:  01/02/24 163 lb (73.9 kg)  12/29/23 163 lb (73.9 kg)  12/01/23 161 lb 1.4 oz (73.1 kg)        Assessment & Plan:   Problem List Items Addressed This Visit       Cardiovascular and Mediastinum   Essential hypertension, benign - Primary   BP Readings from Last 1 Encounters:  01/02/24 130/86   Uncontrolled with amlodipine 10 mg QD and losartan 25 mg QD Increased dose of losartan to 50 mg QD Counseled for compliance with the medications Advised DASH diet and moderate exercise/walking, at least 150 mins/week       Relevant Medications   losartan (COZAAR) 50 MG tablet     Endocrine   Type 2 diabetes mellitus (HCC)   Lab Results  Component Value Date   HGBA1C 6.6 (H) 10/19/2023   Overall well-controlled On metformin 500 mg QD Advised to follow diabetic diet On ARB and statin F/u CMP and lipid panel Glucometer and supplies prescription sent      Relevant Medications   losartan (COZAAR) 50 MG tablet   Blood Glucose Monitoring Suppl DEVI   Glucose Blood (BLOOD GLUCOSE TEST STRIPS) STRP   Lancet Device MISC  Lancets Misc. MISC   Other Relevant Orders   POCT Glucose (CBG) (Completed)   Hyperthyroidism   Lab Results  Component Value Date   TSH 0.022 (L) 09/29/2023    Has been taking methimazole 5 mg once daily F/u with Endocrinology        Other   Loss of appetite   Unclear etiology He has hyperthyroidism, which showed usually cause increase in appetite and loss of weight Although he is concerned about weight loss, his weight has been overall  stable Started mirtazapine for improving appetite - will monitor weight closely      Relevant Medications   mirtazapine (REMERON) 7.5 MG tablet      Meds ordered this encounter  Medications   losartan (COZAAR) 50 MG tablet    Sig: Take 1 tablet (50 mg total) by mouth daily.    Dispense:  90 tablet    Refill:  1    Dose change - 01/02/24   mirtazapine (REMERON) 7.5 MG tablet    Sig: Take 1 tablet (7.5 mg total) by mouth at bedtime.    Dispense:  30 tablet    Refill:  0   Blood Glucose Monitoring Suppl DEVI    Sig: 1 each by Does not apply route in the morning, at noon, and at bedtime. May substitute to any manufacturer covered by patient's insurance.    Dispense:  1 each    Refill:  0   Glucose Blood (BLOOD GLUCOSE TEST STRIPS) STRP    Sig: 1 each by In Vitro route in the morning, at noon, and at bedtime. May substitute to any manufacturer covered by patient's insurance.    Dispense:  100 strip    Refill:  1   Lancet Device MISC    Sig: 1 each by Does not apply route in the morning, at noon, and at bedtime. May substitute to any manufacturer covered by patient's insurance.    Dispense:  1 each    Refill:  0   Lancets Misc. MISC    Sig: 1 each by Does not apply route in the morning, at noon, and at bedtime. May substitute to any manufacturer covered by patient's insurance.    Dispense:  100 each    Refill:  1     Kalayla Shadden Concha Se, MD

## 2024-01-02 NOTE — Assessment & Plan Note (Signed)
 Lab Results  Component Value Date   TSH 0.022 (L) 09/29/2023    Has been taking methimazole 5 mg once daily F/u with Endocrinology

## 2024-01-02 NOTE — Patient Instructions (Addendum)
 Please start taking Losartan 50 mg once daily instead of 25 mg.  Please start taking Mirtazepine as prescribed to improve appetite.

## 2024-01-02 NOTE — Assessment & Plan Note (Addendum)
 Lab Results  Component Value Date   HGBA1C 6.6 (H) 10/19/2023   Overall well-controlled On metformin 500 mg QD Advised to follow diabetic diet On ARB and statin F/u CMP and lipid panel Glucometer and supplies prescription sent

## 2024-01-02 NOTE — Assessment & Plan Note (Signed)
 Unclear etiology He has hyperthyroidism, which showed usually cause increase in appetite and loss of weight Although he is concerned about weight loss, his weight has been overall stable Started mirtazapine for improving appetite - will monitor weight closely

## 2024-01-04 ENCOUNTER — Ambulatory Visit: Payer: 59 | Admitting: Family Medicine

## 2024-01-11 ENCOUNTER — Other Ambulatory Visit: Payer: Self-pay | Admitting: Family Medicine

## 2024-01-11 ENCOUNTER — Telehealth: Payer: Self-pay

## 2024-01-11 ENCOUNTER — Telehealth: Payer: Self-pay | Admitting: Urology

## 2024-01-11 NOTE — Telephone Encounter (Signed)
 Patient called with concerns with his kidney function and wanted to discuss having labs drawn.  I informed patient that urology does not treat the function of his kidney and advised him to reach out to PCP with concerns and request a referral to nephrology.  Patient voiced understanding.  I did tell him to call back with any urologic needs in the meantime.

## 2024-01-11 NOTE — Telephone Encounter (Signed)
 Wants results from April urinalysis

## 2024-01-11 NOTE — Telephone Encounter (Signed)
 See telephone encounter.  Patient aware no urinalysis on file from last visit.

## 2024-01-14 ENCOUNTER — Encounter: Payer: Self-pay | Admitting: Urology

## 2024-01-14 NOTE — Patient Instructions (Signed)

## 2024-01-16 ENCOUNTER — Other Ambulatory Visit: Payer: Self-pay | Admitting: "Endocrinology

## 2024-01-17 ENCOUNTER — Other Ambulatory Visit: Payer: Self-pay | Admitting: Family Medicine

## 2024-01-17 ENCOUNTER — Ambulatory Visit: Payer: 59 | Admitting: Family Medicine

## 2024-01-17 ENCOUNTER — Encounter: Payer: Self-pay | Admitting: Family Medicine

## 2024-01-17 VITALS — BP 106/73 | HR 73 | Ht 66.0 in | Wt 161.1 lb

## 2024-01-17 DIAGNOSIS — R399 Unspecified symptoms and signs involving the genitourinary system: Secondary | ICD-10-CM

## 2024-01-17 DIAGNOSIS — I10 Essential (primary) hypertension: Secondary | ICD-10-CM

## 2024-01-17 DIAGNOSIS — E559 Vitamin D deficiency, unspecified: Secondary | ICD-10-CM

## 2024-01-17 DIAGNOSIS — E1169 Type 2 diabetes mellitus with other specified complication: Secondary | ICD-10-CM

## 2024-01-17 DIAGNOSIS — R8290 Unspecified abnormal findings in urine: Secondary | ICD-10-CM | POA: Diagnosis not present

## 2024-01-17 DIAGNOSIS — B351 Tinea unguium: Secondary | ICD-10-CM

## 2024-01-17 MED ORDER — CICLOPIROX 8 % EX SOLN: Freq: Every day | CUTANEOUS | 3 refills | Status: AC

## 2024-01-17 NOTE — Assessment & Plan Note (Signed)
 Trial Penlac  Ciclopirox  8% Nail Lacquer daily Discussed  keep your nails trimmed and thin to allow the medication to penetrate better. Apply Penlac  (ciclopirox ) nail lacquer daily, and remove old layers once a week with alcohol. Wear breathable shoes, keep your feet dry, and avoid sharing nail tools to prevent reinfection.

## 2024-01-17 NOTE — Assessment & Plan Note (Signed)
 Controlled, Controlled on Amlodipine  10 mg and Losartan  50 mg  Labs ordered. Discussed with  patient to monitor their blood pressure regularly and maintain a heart-healthy diet rich in fruits, vegetables, whole grains, and low-fat dairy, while reducing sodium intake to less than 2,300 mg per day. Regular physical activity, such as 30 minutes of moderate exercise most days of the week, will help lower blood pressure and improve overall cardiovascular health. Avoiding smoking, limiting alcohol consumption, and managing stress. Take  prescribed medication, & take it as directed and avoid skipping doses. Seek emergency care if your blood pressure is (over 180/100) or you experience chest pain, shortness of breath, or sudden vision changes.Patient verbalizes understanding regarding plan of care and all questions answered.

## 2024-01-17 NOTE — Patient Instructions (Addendum)
        Great to see you today.  I have refilled the medication(s) we provide.    Apply SPF 50 sunscreen daily before going outside.   If labs were collected, we will inform you of lab results once received either by echart message or telephone call.   - echart message- for normal results that have been seen by the patient already.   - telephone call: abnormal results or if patient has not viewed results in their echart.   - Please take medications as prescribed. - Follow up with your primary health provider if any health concerns arises. - If symptoms worsen please contact your primary care provider and/or visit the emergency department.

## 2024-01-17 NOTE — Assessment & Plan Note (Addendum)
 He is not experiencing any pain or fever, and there are no signs of hematuria, discharge, hesitancy, or urgency. Notable symptoms include foul-smelling and discolored urine. Increased fluid intake was attempted, but it provided no relief. The patient has no history of kidney stones or recurrent UTIs.    Urinalysis, and urine culture ordered- Awaiting results will follow up. Advise on Avoid irritants like caffeine, alcohol, spicy foods, and artificial sweeteners, as they can aggravate your bladder. Wearing loose, breathable clothing, especially cotton underwear, can help keep the area dry and reduce bacterial growth. If symptoms persist or worsen follow up.  Follow up with Urology

## 2024-01-17 NOTE — Progress Notes (Signed)
 Established Patient Office Visit   Subjective  Patient ID: Kirk Oconnor, male    DOB: 12-Mar-1956  Age: 68 y.o. MRN: [id number]  Chief Complaint  Patient presents with   Medical Management of Chronic Issues    3 month chronic f/u  Seeing discoloration on face.  Has  been experiencing dark urine w/ strong smell     He  has a past medical history of Anxiety, Aortic regurgitation (03/2017), Aortic root aneurysm (03/2017), Arthritis, CAD (coronary artery disease), Carpal tunnel syndrome, Depression, Diabetes mellitus without complication (HCC), Essential hypertension, GERD (gastroesophageal reflux disease), Hepatitis C, and Sleep apnea.  HPI Patient presents to the clinic for  chronic follow up. For the details of today's visit, please refer to assessment and plan.   Review of Systems  Constitutional:  Negative for chills and fever.  Respiratory:  Negative for shortness of breath.   Cardiovascular:  Negative for chest pain.  Genitourinary:  Negative for dysuria, flank pain, frequency, hematuria and urgency.  Musculoskeletal:  Positive for back pain and myalgias.  Neurological:  Negative for dizziness and headaches.      Objective:     BP 106/73   Pulse 73   Ht 5\' 6"  (1.676 m)   Wt 161 lb 1.3 oz (73.1 kg)   SpO2 95%   BMI 26.00 kg/m  BP Readings from Last 3 Encounters:  01/17/24 106/73  01/02/24 (!) 134/90  12/29/23 (!) 147/96      Physical Exam Vitals reviewed.  Constitutional:      General: He is not in acute distress.    Appearance: Normal appearance. He is not ill-appearing, toxic-appearing or diaphoretic.  HENT:     Head: Normocephalic.  Eyes:     General:        Right eye: No discharge.        Left eye: No discharge.     Conjunctiva/sclera: Conjunctivae normal.  Cardiovascular:     Rate and Rhythm: Normal rate.     Pulses: Normal pulses.     Heart sounds: Normal heart sounds.  Pulmonary:     Effort: Pulmonary effort is normal. No respiratory distress.      Breath sounds: Normal breath sounds.  Abdominal:     General: Bowel sounds are normal.     Palpations: Abdomen is soft.     Tenderness: There is no abdominal tenderness. There is no guarding.  Musculoskeletal:     Thoracic back: Decreased range of motion.     Lumbar back: Decreased range of motion. Positive right straight leg raise test and positive left straight leg raise test.  Skin:    General: Skin is warm and dry.     Capillary Refill: Capillary refill takes less than 2 seconds.  Neurological:     Mental Status: He is alert.     Coordination: Coordination normal.     Gait: Gait abnormal.  Psychiatric:        Mood and Affect: Mood normal.        Behavior: Behavior normal.      No results found for any visits on 01/17/24.  The ASCVD Risk score (Arnett DK, et al., 2019) failed to calculate for the following reasons:   Risk score cannot be calculated because patient has a medical history suggesting prior/existing ASCVD    Assessment & Plan:  Urinary symptom or sign -     UA/M w/rflx Culture, Comp  Type 2 diabetes mellitus with other specified complication, without long-term current use of  insulin (HCC) -     Ambulatory referral to Ophthalmology  Primary hypertension Assessment & Plan: Controlled, Controlled on Amlodipine  10 mg and Losartan  50 mg  Labs ordered. Discussed with  patient to monitor their blood pressure regularly and maintain a heart-healthy diet rich in fruits, vegetables, whole grains, and low-fat dairy, while reducing sodium intake to less than 2,300 mg per day. Regular physical activity, such as 30 minutes of moderate exercise most days of the week, will help lower blood pressure and improve overall cardiovascular health. Avoiding smoking, limiting alcohol consumption, and managing stress. Take  prescribed medication, & take it as directed and avoid skipping doses. Seek emergency care if your blood pressure is (over 180/100) or you experience chest pain,  shortness of breath, or sudden vision changes.Patient verbalizes understanding regarding plan of care and all questions answered.   Orders: -     CMP14+EGFR -     Lipid panel -     CBC with Differential/Platelet  Vitamin D  deficiency -     VITAMIN D  25 Hydroxy (Vit-D Deficiency, Fractures)  Onychomycosis Assessment & Plan: Trial Penlac  Ciclopirox  8% Nail Lacquer daily Discussed  keep your nails trimmed and thin to allow the medication to penetrate better. Apply Penlac  (ciclopirox ) nail lacquer daily, and remove old layers once a week with alcohol. Wear breathable shoes, keep your feet dry, and avoid sharing nail tools to prevent reinfection.  Orders: -     Ambulatory referral to Podiatry -     Ciclopirox ; Apply topically at bedtime. Apply over nail and surrounding skin. Apply daily over previous coat. After seven (7) days, may remove with alcohol and continue cycle.  Dispense: 6.6 mL; Refill: 3  Abnormal urine odor Assessment & Plan: He is not experiencing any pain or fever, and there are no signs of hematuria, discharge, hesitancy, or urgency. Notable symptoms include foul-smelling and discolored urine. Increased fluid intake was attempted, but it provided no relief. The patient has no history of kidney stones or recurrent UTIs.    Urinalysis, and urine culture ordered- Awaiting results will follow up. Advise on Avoid irritants like caffeine, alcohol, spicy foods, and artificial sweeteners, as they can aggravate your bladder. Wearing loose, breathable clothing, especially cotton underwear, can help keep the area dry and reduce bacterial growth. If symptoms persist or worsen follow up.  Follow up with Urology      Return in about 4 months (around 05/18/2024), or if symptoms worsen or fail to improve, for type 2 diabetes.   Avelino Lek Amber Bail, FNP

## 2024-01-18 ENCOUNTER — Ambulatory Visit: Payer: Self-pay

## 2024-01-18 LAB — CBC WITH DIFFERENTIAL/PLATELET
Basophils Absolute: 0 x10E3/uL (ref 0.0–0.2)
Basos: 0 %
EOS (ABSOLUTE): 0.2 x10E3/uL (ref 0.0–0.4)
Eos: 2 %
Hematocrit: 40.7 % (ref 37.5–51.0)
Hemoglobin: 14 g/dL (ref 13.0–17.7)
Immature Grans (Abs): 0 x10E3/uL (ref 0.0–0.1)
Immature Granulocytes: 0 %
Lymphocytes Absolute: 2.4 x10E3/uL (ref 0.7–3.1)
Lymphs: 34 %
MCH: 30.4 pg (ref 26.6–33.0)
MCHC: 34.4 g/dL (ref 31.5–35.7)
MCV: 88 fL (ref 79–97)
Monocytes Absolute: 0.9 x10E3/uL (ref 0.1–0.9)
Monocytes: 12 %
Neutrophils Absolute: 3.5 x10E3/uL (ref 1.4–7.0)
Neutrophils: 52 %
Platelets: 165 x10E3/uL (ref 150–450)
RBC: 4.61 x10E6/uL (ref 4.14–5.80)
RDW: 13.5 % (ref 11.6–15.4)
WBC: 7 x10E3/uL (ref 3.4–10.8)

## 2024-01-18 LAB — UA/M W/RFLX CULTURE, COMP
Bilirubin, UA: NEGATIVE
Glucose, UA: NEGATIVE
Leukocytes,UA: NEGATIVE
Nitrite, UA: NEGATIVE
RBC, UA: NEGATIVE
Specific Gravity, UA: 1.02 (ref 1.005–1.030)
Urobilinogen, Ur: 1 mg/dL (ref 0.2–1.0)
pH, UA: 5.5 (ref 5.0–7.5)

## 2024-01-18 LAB — VITAMIN D 25 HYDROXY (VIT D DEFICIENCY, FRACTURES): Vit D, 25-Hydroxy: 28.3 ng/mL — ABNORMAL LOW (ref 30.0–100.0)

## 2024-01-18 LAB — LIPID PANEL
Chol/HDL Ratio: 3.9 ratio (ref 0.0–5.0)
Cholesterol, Total: 130 mg/dL (ref 100–199)
HDL: 33 mg/dL — ABNORMAL LOW (ref >39)
LDL Chol Calc (NIH): 82 mg/dL (ref 0–99)
Triglycerides: 75 mg/dL (ref 0–149)
VLDL Cholesterol Cal: 15 mg/dL (ref 5–40)

## 2024-01-18 LAB — CMP14+EGFR
ALT: 32 IU/L (ref 0–44)
AST: 23 IU/L (ref 0–40)
Albumin: 4.2 g/dL (ref 3.9–4.9)
Alkaline Phosphatase: 89 IU/L (ref 44–121)
BUN/Creatinine Ratio: 15 (ref 10–24)
BUN: 17 mg/dL (ref 8–27)
Bilirubin Total: 0.8 mg/dL (ref 0.0–1.2)
CO2: 22 mmol/L (ref 20–29)
Calcium: 10 mg/dL (ref 8.6–10.2)
Chloride: 101 mmol/L (ref 96–106)
Creatinine, Ser: 1.14 mg/dL (ref 0.76–1.27)
Globulin, Total: 2.7 g/dL (ref 1.5–4.5)
Glucose: 92 mg/dL (ref 70–99)
Potassium: 4.5 mmol/L (ref 3.5–5.2)
Sodium: 138 mmol/L (ref 134–144)
Total Protein: 6.9 g/dL (ref 6.0–8.5)
eGFR: 71 mL/min/{1.73_m2} (ref >59)

## 2024-01-18 LAB — MICROSCOPIC EXAMINATION: Bacteria, UA: NONE SEEN

## 2024-01-18 NOTE — Telephone Encounter (Signed)
 Copied from CRM [id number]. Topic: Clinical - Red Word Triage >> Jan 18, 2024  8:58 AM Turkey B wrote: [facility] that prompted transfer to Nurse Triage: left toe is swollen  Chief Complaint: left toe swelling; pain Symptoms: states was cutting his toenails; hx DM Frequency: constant Pertinent Negatives: Patient denies fever, pus Disposition: [] ED /[] Urgent Care (no appt availability in office) / [x] Appointment(In office/virtual)/ []  [facility]/ [] Home Care/ [] Refused Recommended Disposition /[] [facility]/ []  Follow-up with PCP Additional Notes: per protocol apt made for Monday; care advice given, denies questions; instructed to go to ER if becomes worse.   Reason for Disposition  [1] MODERATE-SEVERE pain AND [2] blood present under the toenail  Answer Assessment - Initial Assessment Questions 1. MECHANISM: "How did the injury happen?"      Left toe swollen, was cutting his toenails.  2. ONSET: "When did the injury happen?" (Minutes or hours ago)      About a month now 3. LOCATION: "What part of the toe is injured?" "Is the nail damaged?"      Left toe 4. APPEARANCE of TOE INJURY: "What does the injury look like?"      Swollen, toenail black 5. SEVERITY: "Can you use the foot normally?" "Can you walk?"      yes 6. SIZE: For cuts, bruises, or swelling, ask: "How large is it?" (e.g., inches or centimeters;  entire toe)      na 7. PAIN: "Is there pain?" If Yes, ask: "How bad is the pain?"   (e.g., Scale 1-10; or mild, moderate, severe)     6/10 8. TETANUS: For any breaks in the skin, ask: "When was the last tetanus booster?"     na 9. DIABETES: "Do you have a history of diabetes or poor circulation in the feet?"     yes 10. OTHER SYMPTOMS: "Do you have any other symptoms?"        na 11. PREGNANCY: "Is there any chance you are pregnant?" "When was your last menstrual period?"       na  Protocols used: Toe Injury-A-AH

## 2024-01-18 NOTE — Telephone Encounter (Signed)
Noted appointment made

## 2024-01-22 ENCOUNTER — Ambulatory Visit (INDEPENDENT_AMBULATORY_CARE_PROVIDER_SITE_OTHER): Payer: Self-pay

## 2024-01-22 ENCOUNTER — Ambulatory Visit: Admitting: Urology

## 2024-01-22 VITALS — BP 147/96 | HR 81 | Ht 66.0 in | Wt 162.1 lb

## 2024-01-22 DIAGNOSIS — B351 Tinea unguium: Secondary | ICD-10-CM | POA: Diagnosis not present

## 2024-01-22 MED ORDER — CEPHALEXIN 500 MG PO CAPS: 500.0000 mg | ORAL_CAPSULE | Freq: Three times a day (TID) | ORAL | 0 refills | Status: AC

## 2024-01-22 NOTE — Progress Notes (Unsigned)
   Established Patient Office Visit  Subjective   Patient ID: Kirk Oconnor, male    DOB: [birth year]  Age: 68 y.o. MRN: [id number]  Chief Complaint  Patient presents with   Medical Management of Chronic Issues    PT states he's here for his toe nails to be clipped     HPI The patient was seen last week for his chronic conditions along with symptoms of UTI and issues with his toenails.  He was referred to the podiatrist at that time, but he said no one reached out to him.  There was a triage call the day after his appointment stating that he had a toe injury with toe pain so he was placed on my schedule for today patient is upset because he thought he was going to have his toenails clipped today     Review of Systems  Constitutional: Negative.       Objective:     BP (!) 147/96   Pulse 81   Ht 5\' 6"  (1.676 m)   Wt 162 lb 1.9 oz (73.5 kg)   SpO2 97%   BMI 26.17 kg/m    Physical Exam Vitals and nursing note reviewed.  Constitutional:      Appearance: Normal appearance.  Feet:     Right foot:     Skin integrity: Skin integrity normal.     Toenail Condition: Right toenails are abnormally thick, long and ingrown. Fungal disease present.    Left foot:     Skin integrity: Erythema (second toe, around nail bed) and warmth (second toe) present. No ulcer.     Toenail Condition: Left toenails are abnormally thick, long and ingrown. Fungal disease present. Neurological:     Mental Status: He is alert and oriented to person, place, and time.  Psychiatric:        Mood and Affect: Mood normal.        Thought Content: Thought content normal.      No results found for any visits on 01/22/24.    The ASCVD Risk score (Arnett DK, et al., 2019) failed to calculate for the following reasons:   Risk score cannot be calculated because patient has a medical history suggesting prior/existing ASCVD    Assessment & Plan:   Problem List Items Addressed This Visit        Musculoskeletal and Integument   Onychomycosis - Primary   We were able to get an appointment for him with podiatry in [facility], Tuesday, 4/29 at 9:40 [phone]  Antibiotic sent to [facility] pharmacy      Relevant Medications   cephALEXin  (KEFLEX ) 500 MG capsule   Other Relevant Orders   Ambulatory referral to Podiatry    No follow-ups on file.    Alison Irvine, FNP

## 2024-01-22 NOTE — Patient Instructions (Addendum)
 Appointment made for Tuesday, 4/29 at 9:40 am with podiatrist in [place]. [address]  [phone]  Antibiotic sent to [facility] pharmacy

## 2024-01-23 ENCOUNTER — Other Ambulatory Visit: Payer: Self-pay | Admitting: *Deleted

## 2024-01-23 ENCOUNTER — Other Ambulatory Visit: Payer: Self-pay

## 2024-01-23 DIAGNOSIS — B351 Tinea unguium: Secondary | ICD-10-CM | POA: Diagnosis not present

## 2024-01-23 DIAGNOSIS — L03031 Cellulitis of right toe: Secondary | ICD-10-CM | POA: Diagnosis not present

## 2024-01-23 DIAGNOSIS — L03032 Cellulitis of left toe: Secondary | ICD-10-CM | POA: Diagnosis not present

## 2024-01-23 DIAGNOSIS — M79674 Pain in right toe(s): Secondary | ICD-10-CM | POA: Diagnosis not present

## 2024-01-23 DIAGNOSIS — M79675 Pain in left toe(s): Secondary | ICD-10-CM | POA: Diagnosis not present

## 2024-01-23 NOTE — Assessment & Plan Note (Signed)
 We were able to get an appointment for him with podiatry in [facility], Tuesday, 4/29 at 9:40 [phone]  Antibiotic sent to [facility] pharmacy

## 2024-01-23 NOTE — Telephone Encounter (Signed)
 Patient was scheduled for a ua/uc on 04/30 from his previous telephone encounter.  After scheduling this appt and discussing the details with the patient further his concern was checking his kidney function.  I informed him that he did not need to keep this lab appt on 04/30, and upon reviewing his chart his PCP has already done a ua and lab work on 04/23.  I advised the patient to contact PCP for results and that we would cancel this appt for a ua/uc unless he was symptomatic.  Patient denies any symptoms of a UTI or fever at this time and states he will contact his PCP for results.

## 2024-01-23 NOTE — Patient Outreach (Signed)
 Complex Care Management   Visit Note  01/23/2024  Name:  Kirk Oconnor MRN: [id number] DOB: 12-Nov-1955  Situation: Referral received for Complex Care Management related to Diabetes with Complications I obtained verbal consent from Patient.  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   Arthritis    CAD (coronary artery disease)    Nonobstructive at cardiac catheterization July 2018 - [facility]   Carpal tunnel syndrome    Depression    Diabetes mellitus without complication (HCC)    Essential hypertension    GERD (gastroesophageal reflux disease)    Hepatitis C    Sleep apnea     Assessment: Patient Reported Symptoms:  Cognitive Cognitive Status: Alert and oriented to person, place, and time Cognitive/Intellectual Conditions Management [RPT]: None reported or documented in medical history or problem list   Health Maintenance Behaviors: Annual physical exam Health Facilitated by: Rest  Neurological Neurological Review of Symptoms: No symptoms reported Neurological Self-Management Outcome: 4 (good)  HEENT HEENT Symptoms Reported: No symptoms reported HEENT Management Strategies: Routine screening HEENT Self-Management Outcome: 4 (good)    Cardiovascular Cardiovascular Symptoms Reported: No symptoms reported Does patient have uncontrolled Hypertension?: No Cardiovascular Conditions: Hypertension Cardiovascular Management Strategies: Medication therapy Cardiovascular Self-Management Outcome: 4 (good)  Respiratory Respiratory Symptoms Reported: No symptoms reported Respiratory Self-Management Outcome: 4 (good)  Endocrine Patient reports the following symptoms related to  hypoglycemia or hyperglycemia : No symptoms reported Is patient diabetic?: Yes Is patient checking blood sugars at home?: No Endocrine Conditions: Diabetes, Thyroid  disorder Endocrine Self-Management Outcome: 4 (good)  Gastrointestinal Gastrointestinal Symptoms Reported: No symptoms reported Gastrointestinal Self-Management Outcome: 4 (good) Nutrition Risk Screen (CP): No indicators present  Genitourinary   Genitourinary Conditions: Frequency Genitourinary Self-Management Outcome: 3 (uncertain)  Integumentary Integumentary Symptoms Reported: Skin changes Additional Integumentary Details: Left second toe swelling Skin Management Strategies: Medication therapy, Routine screening Skin Self-Management Outcome: 3 (uncertain)  Musculoskeletal Musculoskelatal Symptoms Reviewed: Not assessed Musculoskeletal Self-Management Outcome: 4 (good) Falls in the past year?: No Number of falls in past year: 1 or less Was there an injury with Fall?: No Fall Risk Category Calculator: 0 Patient Fall Risk Level: Low Fall Risk Patient at Risk for Falls Due to: History of fall(s) Fall risk Follow up: Falls evaluation completed  Psychosocial Psychosocial Symptoms Reported: No symptoms reported Behavioral Health Self-Management Outcome: 4 (good) Major Change/Loss/Stressor/Fears (CP): Denies Techniques to Cope with Loss/Stress/Change: Not applicable Quality of Family Relationships: helpful, involved, supportive Do you feel physically threatened by others?: No      01/23/2024    2:58 PM  Depression screen PHQ 2/9  Decreased Interest 0  Down, Depressed, Hopeless 0  PHQ - 2 Score 0    There were no vitals filed for this visit.  Medications Reviewed Today     Reviewed by Remona Carmel, RN (Registered Nurse) on 01/23/24 at 1451  Med List Status: <None>   Medication Order Taking? Sig Documenting Provider Last Dose Status Informant  acetaminophen  (TYLENOL ) 500 MG tablet [id number] Yes Take 500 mg by  mouth every 6 (six) hours as needed. [provider] Taking Active   AMBULATORY NON FORMULARY MEDICATION [id number]  Vacuum erection device. Dispense  1. Larocco, Sarah C, FNP  Active   amLODipine  (NORVASC ) 10 MG tablet [id number] Yes Take 1 tablet (10 mg total) by mouth daily. Del Amber Bail, Rogerio Clay, FNP Taking Active   aspirin  EC 81 MG tablet [id number] Yes Take 81 mg by mouth daily. [provider] Taking Active Family Member  Blood Glucose Monitoring Suppl DEVI [id number] Yes 1 each by Does not apply route in the morning, at noon, and at bedtime. May substitute to any manufacturer covered by patient's insurance. Meldon Sport, MD Taking Active   Blood Pressure Monitor DEVI [id number] Yes 1 each by Does not apply route daily. Del Abron Abt, FNP Taking Active   Blood Pressure Monitoring (BLOOD PRESSURE KIT) DEVI [id number] Yes Take blood pressure reading once daily Del Orbe Polanco, Iliana, FNP Taking Active   cephALEXin  (KEFLEX ) 500 MG capsule [id number] Yes Take 1 capsule (500 mg total) by mouth 3 (three) times daily for 7 days. Alison Irvine, FNP Taking Active   ciclopirox  (PENLAC ) 8 % solution [id number] Yes Apply topically at bedtime. Apply over nail and surrounding skin. Apply daily over previous coat. After seven (7) days, may remove with alcohol and continue cycle. Del Amber Bail, Rogerio Clay, FNP Taking Active   gabapentin  (NEURONTIN ) 300 MG capsule [id number] Yes TAKE 1 CAPSULE BY MOUTH EVERY 8 HOURS AS NEEDED Del Orbe Polanco, [place], FNP Taking Active   Glucose Blood (BLOOD GLUCOSE TEST STRIPS) STRP [id number] Yes 1 each by In Vitro route in the morning, at noon, and at bedtime. May substitute to any manufacturer covered by patient's insurance. Meldon Sport, MD Taking Active   ketoconazole (NIZORAL) 2 % cream [id number] Yes Apply 1 Application topically 2 (two) times daily. [provider] Taking Active   Lancet Device MISC [id number] Yes 1 each by Does not  apply route in the morning, at noon, and at bedtime. May substitute to any manufacturer covered by patient's insurance. Meldon Sport, MD Taking Active   Lancets Misc. MISC [id number] Yes 1 each by Does not apply route in the morning, at noon, and at bedtime. May substitute to any manufacturer covered by patient's insurance. Meldon Sport, MD Taking Active   losartan  (COZAAR ) 50 MG tablet [id number] Yes Take 1 tablet (50 mg total) by mouth daily. Meldon Sport, MD Taking Active   meclizine  (ANTIVERT ) 25 MG tablet [id number] Yes Take 1 tablet (25 mg total) by mouth 2 (two) times daily as needed for dizziness. Del Amber Bail, Rogerio Clay, FNP Taking Active            Med Note (ROBB, MELANIE A   Mon Oct 23, 2023  4:33 PM) Did not like the way it made him feel  metFORMIN  (GLUCOPHAGE ) 500 MG tablet [id number] Yes Take 1 tablet by mouth once daily with breakfast Del Amber Bail, [place], FNP Taking Active   methimazole  (TAPAZOLE ) 5 MG tablet [id number] Yes Take 1 tablet by mouth once daily with breakfast Nida, Gebreselassie W, MD Taking Active   mirtazapine  (REMERON ) 7.5 MG tablet [id number] No Take 1 tablet (7.5 mg total) by mouth at bedtime.  Patient not taking: Reported on 01/23/2024   Meldon Sport, MD Not Taking Consider Medication Status and Discontinue   omeprazole  (PRILOSEC) 40 MG capsule [id number]  Take 1 capsule (40 mg total) by mouth 2 (two) times daily. Vinetta Greening, DO  Expired 12/15/23 2359            Med Note Bennett Brass, TAMMY   Mon Mar 27, 2023  3:32 PM)    pregabalin  (LYRICA ) 50 MG capsule [id number] Yes Take 1 capsule (50 mg total) by mouth 2 (two) times daily. Del Amber Bail, Rogerio Clay, FNP Taking Active   rosuvastatin  (CRESTOR ) 5 MG tablet [id number] Yes Take 5 mg by mouth daily. [provider] Taking Active   sildenafil  (REVATIO ) 20 MG tablet [id number] Yes Take 1 tablet (20 mg total) by mouth daily. McKenzie, Arden Beck, MD Taking Active   sildenafil  (VIAGRA ) 100 MG tablet  [id number] Yes Take 1 tablet (100 mg total) by mouth as needed for erectile dysfunction. McKenzie, Arden Beck, MD Taking Active   tiZANidine  (ZANAFLEX ) 4 MG tablet [id number] Yes TAKE 1 TABLET BY MOUTH EVERY 12 HOURS AS NEEDED FOR MUSCLE SPASM Del Orbe Polanco, Rogerio Clay, FNP Taking Active   traMADol  (ULTRAM ) 50 MG tablet [id number] Yes Take 1 tablet (50 mg total) by mouth daily as needed. Del Abron Abt, FNP Taking Active            Med Note (ROBB, MELANIE A   Mon Oct 23, 2023  4:32 PM) Did not like the way it made him feel            Recommendation:   PCP Follow-up  Follow Up Plan:   Telephone follow up appointment date/time:  02-22-2024 at 9:00 am  Grandville Lax, BSN RN [facility], [facility] RN Care Manager Direct Dial: [phone]  Fax: [phone]

## 2024-01-23 NOTE — Patient Instructions (Signed)
 Visit Information  Thank you for taking time to visit with me today. Please don't hesitate to contact me if I can be of assistance to you before our next scheduled appointment.  Your next care management appointment is by telephone on 02-22-2024 at 9:00 am  Telephone follow-up in 1 month  Please call the care guide team at [phone] if you need to cancel, schedule, or reschedule an appointment.   Please call the Suicide and Crisis Lifeline: 988 call the [place]  National Suicide Prevention Lifeline: 605-805-2372 or TTY: 734-528-9772 TTY (712)507-2302) to talk to a trained counselor call 1-800-273-TALK (toll free, 24 hour hotline) call the [facility]: [phone] call 911 if you are experiencing a Mental Health or Behavioral Health Crisis or need someone to talk to.  Grandville Lax, BSN RN [facility], [facility] RN Care Manager Direct Dial: [phone]  Fax: [phone]   How to Take Your Blood Pressure Blood pressure measures how strongly your blood is pressing against the walls of your arteries. Arteries are blood vessels that carry blood from your heart throughout your body. You can take your blood pressure at home with a machine. You may need to check your blood pressure at home: To check if you have high blood pressure (hypertension). To check your blood pressure over time. To make sure your blood pressure medicine is working. Supplies needed: Blood pressure machine, or monitor. A chair to sit in. This should be a chair where you can sit upright with your back supported. Do not sit on a soft couch or an armchair. Table or desk. Small notebook. Pencil or pen. How to prepare Avoid these things for 30 minutes before checking your blood pressure: Having drinks with caffeine in them, such as coffee or tea. Drinking alcohol. Eating. Smoking. Exercising. Do these things five minutes before checking your blood  pressure: Go to the bathroom and pee (urinate). Sit in a chair. Be quiet. Do not talk. How to take your blood pressure Follow the instructions that came with your machine. If you have a digital blood pressure monitor, these may be the instructions: Sit up straight. Place your feet on the floor. Do not cross your ankles or legs. Rest your left arm at the level of your heart. You may rest it on a table, desk, or chair. Pull up your shirt sleeve. Wrap the blood pressure cuff around the upper part of your left arm. The cuff should be 1 inch (2.5 cm) above your elbow. It is best to wrap the cuff around bare skin. Fit the cuff snugly around your arm, but not too tightly. You should be able to place only one finger between the cuff and your arm. Place the cord so that it rests in the bend of your elbow. Press the power button. Sit quietly while the cuff fills with air and loses air. Write down the numbers on the screen. Wait 2-3 minutes and then repeat steps 1-10. What do the numbers mean? Two numbers make up your blood pressure. The first number is called systolic pressure. The second is called diastolic pressure. An example of a blood pressure reading is "120 over 80" (or 120/80). If you are an adult and do not have a medical condition, use this guide to find out if your blood pressure is normal: Normal First number: below 120. Second number: below 80. Elevated First number: 120-129. Second number: below 80. Hypertension stage 1 First number: 130-139. Second number: 80-89. Hypertension stage 2  First number: 140 or above. Second number: 90 or above. Your blood pressure is above normal even if only the first or only the second number is above normal. Follow these instructions at home: Medicines Take over-the-counter and prescription medicines only as told by your doctor. Tell your doctor if your medicine is causing side effects. General instructions Check your blood pressure as often  as your doctor tells you to. Check your blood pressure at the same time every day. Take your monitor to your next doctor's appointment. Your doctor will: Make sure you are using it correctly. Make sure it is working right. Understand what your blood pressure numbers should be. Keep all follow-up visits. General tips You will need a blood pressure machine or monitor. Your doctor can suggest a monitor. You can buy one at a drugstore or online. When choosing one: Choose one with an arm cuff. Choose one that wraps around your upper arm. Only one finger should fit between your arm and the cuff. Do not choose one that measures your blood pressure from your wrist or finger. Where to find more information American Heart Association: www.heart.org Contact a doctor if: Your blood pressure keeps being high. Your blood pressure is suddenly low. Get help right away if: Your first blood pressure number is higher than 180. Your second blood pressure number is higher than 120. These symptoms may be an emergency. Do not wait to see if the symptoms will go away. Get help right away. Call 911. Summary Check your blood pressure at the same time every day. Avoid caffeine, alcohol, smoking, and exercise for 30 minutes before checking your blood pressure. Make sure you understand what your blood pressure numbers should be. This information is not intended to replace advice given to you by your health care provider. Make sure you discuss any questions you have with your health care provider. Document Revised: 05/27/2021 Document Reviewed: 05/27/2021 Elsevier Patient Education  2024 Elsevier Inc.   How to Use a Glucose Meter In this video, you will learn how to test your blood sugar at home using a device called a glucose meter or glucometer. To view the content, go to this web address: https://pe.elsevier.com/[email or web address]  This video will expire on: 09/06/2025. If you need access to this video following this  date, please reach out to the healthcare provider who assigned it to you. This information is not intended to replace advice given to you by your health care provider. Make sure you discuss any questions you have with your health care provider. Elsevier Patient Education  2024 Elsevier Inc.  DASH Eating Plan DASH stands for Dietary Approaches to Stop Hypertension. The DASH eating plan is a healthy eating plan that has been shown to: Lower high blood pressure (hypertension). Reduce your risk for type 2 diabetes, heart disease, and stroke. Help with weight loss. What are tips for following this plan? Reading food labels Check food labels for the amount of salt (sodium) per serving. Choose foods with less than 5 percent of the Daily Value (DV) of sodium. In general, foods with less than 300 milligrams (mg) of sodium per serving fit into this eating plan. To find whole grains, look for the word "whole" as the first word in the ingredient list. Shopping Buy products labeled as "low-sodium" or "no salt added." Buy fresh foods. Avoid canned foods and pre-made or frozen meals. Cooking Try not to add salt when you cook. Use salt-free seasonings or herbs instead of table salt or sea salt. Check  with your health care provider or pharmacist before using salt substitutes. Do not fry foods. Cook foods in healthy ways, such as baking, boiling, grilling, roasting, or broiling. Cook using oils that are good for your heart. These include olive, canola, avocado, soybean, and sunflower oil. Meal planning  Eat a balanced diet. This should include: 4 or more servings of fruits and 4 or more servings of vegetables each day. Try to fill half of your plate with fruits and vegetables. 6-8 servings of whole grains each day. 6 or less servings of lean meat, poultry, or fish each day. 1 oz is 1 serving. A 3 oz (85 g) serving of meat is about the same size as the palm of your hand. One egg is 1 oz (28 g). 2-3 servings  of low-fat dairy each day. One serving is 1 cup (237 mL). 1 serving of nuts, seeds, or beans 5 times each week. 2-3 servings of heart-healthy fats. Healthy fats called omega-3 fatty acids are found in foods such as walnuts, flaxseeds, fortified milks, and eggs. These fats are also found in cold-water  fish, such as sardines, salmon, and mackerel. Limit how much you eat of: Canned or prepackaged foods. Food that is high in trans fat, such as fried foods. Food that is high in saturated fat, such as fatty meat. Desserts and other sweets, sugary drinks, and other foods with added sugar. Full-fat dairy products. Do not salt foods before eating. Do not eat more than 4 egg yolks a week. Try to eat at least 2 vegetarian meals a week. Eat more home-cooked food and less restaurant, buffet, and fast food. Lifestyle When eating at a restaurant, ask if your food can be made with less salt or no salt. If you drink alcohol: Limit how much you have to: 0-1 drink a day if you are male. 0-2 drinks a day if you are male. Know how much alcohol is in your drink. In the [place] one drink is one 12 oz bottle of beer (355 mL), one 5 oz glass of wine (148 mL), or one 1 oz glass of hard liquor (44 mL). General information Avoid eating more than 2,300 mg of salt a day. If you have hypertension, you may need to reduce your sodium intake to 1,500 mg a day. Work with your provider to stay at a healthy body weight or lose weight. Ask what the best weight range is for you. On most days of the week, get at least 30 minutes of exercise that causes your heart to beat faster. This may include walking, swimming, or biking. Work with your provider or dietitian to adjust your eating plan to meet your specific calorie needs. What foods should I eat? Fruits All fresh, dried, or frozen fruit. Canned fruits that are in their natural juice and do not have sugar added to them. Vegetables Fresh or frozen vegetables that are raw,  steamed, roasted, or grilled. Low-sodium or reduced-sodium tomato and vegetable juice. Low-sodium or reduced-sodium tomato sauce and tomato paste. Low-sodium or reduced-sodium canned vegetables. Grains Whole-grain or whole-wheat bread. Whole-grain or whole-wheat pasta. Brown rice. Dwyane Glad. Bulgur. Whole-grain and low-sodium cereals. Pita bread. Low-fat, low-sodium crackers. Whole-wheat flour tortillas. Meats and other proteins Skinless chicken or [place]. Ground chicken or [place]. Pork with fat trimmed off. Fish and seafood. Egg whites. Dried beans, peas, or lentils. Unsalted nuts, nut butters, and seeds. Unsalted canned beans. Lean cuts of beef with fat trimmed off. Low-sodium, lean precooked or cured meat, such as sausages  or meat loaves. Dairy Low-fat (1%) or fat-free (skim) milk. Reduced-fat, low-fat, or fat-free cheeses. Nonfat, low-sodium ricotta or cottage cheese. Low-fat or nonfat yogurt. Low-fat, low-sodium cheese. Fats and oils Soft margarine without trans fats. Vegetable oil. Reduced-fat, low-fat, or light mayonnaise and salad dressings (reduced-sodium). Canola, safflower, olive, avocado, soybean, and sunflower oils. Avocado. Seasonings and condiments Herbs. Spices. Seasoning mixes without salt. Other foods Unsalted popcorn and pretzels. Fat-free sweets. The items listed above may not be all the foods and drinks you can have. Talk to a dietitian to learn more. What foods should I avoid? Fruits Canned fruit in a light or heavy syrup. Fried fruit. Fruit in cream or butter sauce. Vegetables Creamed or fried vegetables. Vegetables in a cheese sauce. Regular canned vegetables that are not marked as low-sodium or reduced-sodium. Regular canned tomato sauce and paste that are not marked as low-sodium or reduced-sodium. Regular tomato and vegetable juices that are not marked as low-sodium or reduced-sodium. Vanessa General. Olives. Grains Baked goods made with fat, such as croissants, muffins,  or some breads. Dry pasta or rice meal packs. Meats and other proteins Fatty cuts of meat. Ribs. Fried meat. Helene Loader. Bologna, salami, and other precooked or cured meats, such as sausages or meat loaves, that are not lean and low in sodium. Fat from the back of a pig (fatback). Bratwurst. Salted nuts and seeds. Canned beans with added salt. Canned or smoked fish. Whole eggs or egg yolks. Chicken or [place] with skin. Dairy Whole or 2% milk, cream, and half-and-half. Whole or full-fat cream cheese. Whole-fat or sweetened yogurt. Full-fat cheese. Nondairy creamers. Whipped toppings. Processed cheese and cheese spreads. Fats and oils Butter. Stick margarine. Lard. Shortening. Ghee. Bacon fat. Tropical oils, such as coconut, palm kernel, or palm oil. Seasonings and condiments Onion salt, garlic salt, seasoned salt, table salt, and sea salt. Worcestershire sauce. Tartar sauce. Barbecue sauce. Teriyaki sauce. Soy sauce, including reduced-sodium soy sauce. Steak sauce. Canned and packaged gravies. Fish sauce. Oyster sauce. Cocktail sauce. Store-bought horseradish. Ketchup. Mustard. Meat flavorings and tenderizers. Bouillon cubes. Hot sauces. Pre-made or packaged marinades. Pre-made or packaged taco seasonings. Relishes. Regular salad dressings. Other foods Salted popcorn and pretzels. The items listed above may not be all the foods and drinks you should avoid. Talk to a dietitian to learn more. Where to find more information National Heart, Lung, and Blood Institute (NHLBI): BuffaloDryCleaner.gl American Heart Association (AHA): heart.org Academy of Nutrition and Dietetics: eatright.org National Kidney Foundation (NKF): kidney.org This information is not intended to replace advice given to you by your health care provider. Make sure you discuss any questions you have with your health care provider. Document Revised: 09/29/2022 Document Reviewed: 09/29/2022 Elsevier Patient Education  2024 [employer].

## 2024-01-24 ENCOUNTER — Telehealth (HOSPITAL_COMMUNITY): Payer: Self-pay

## 2024-01-24 ENCOUNTER — Other Ambulatory Visit

## 2024-01-24 ENCOUNTER — Telehealth: Payer: Self-pay

## 2024-01-24 ENCOUNTER — Ambulatory Visit (HOSPITAL_COMMUNITY): Attending: Family Medicine

## 2024-01-24 NOTE — Telephone Encounter (Signed)
 Copied from CRM [id number]. Topic: Clinical - Lab/Test Results >> Jan 23, 2024  4:13 PM Turkey B wrote: Reason for CRM: pt called about lab results, not notes yet. Please cb when available >> Jan 24, 2024  9:37 AM Crispin Dolphin wrote: Patient called back to check status of results. Let him know message was sent to provider to review and give him a call. Thank You

## 2024-01-24 NOTE — Telephone Encounter (Signed)
 Spoke with patient on the phone. He is exercising at [employer] and does not want to proceed with formal physical therapy at this time.   1:54 PM, 01/24/24 Kirk Oconnor Kirk Oconnor MPT [facility] [place] [phone]

## 2024-01-24 NOTE — Telephone Encounter (Signed)
 Copied from CRM [id number]. Topic: Clinical - Lab/Test Results >> Jan 23, 2024  5:44 PM Alyse July wrote: Reason for CRM: Please contact patient to discuss lab results.

## 2024-01-25 ENCOUNTER — Telehealth: Payer: Self-pay

## 2024-01-25 NOTE — Telephone Encounter (Signed)
 Copied from CRM [id number]. Topic: Clinical - Lab/Test Results >> Jan 25, 2024 11:45 AM Star East wrote: Reason for CRM: Patient still waiting for lab results- [phone]

## 2024-01-26 NOTE — Telephone Encounter (Signed)
Provider has not reviewed labs

## 2024-01-26 NOTE — Telephone Encounter (Signed)
Labs have not been reviewed by provider. 

## 2024-01-29 ENCOUNTER — Telehealth: Payer: Self-pay | Admitting: Urology

## 2024-01-29 NOTE — Telephone Encounter (Signed)
 Patient left message wanting blood test results. He also states his urine is dark in color and has an odor. He is also feeling dizzy.

## 2024-01-29 NOTE — Telephone Encounter (Signed)
 Please have patient contact PCP for lab work.  The last time I spoke with him I informed him that they ordered the labs so he would need to follow up with them regarding results.  They also did his [place] [facility] they can give him those results as well.  Thank you.

## 2024-02-05 ENCOUNTER — Ambulatory Visit: Payer: 59 | Admitting: "Endocrinology

## 2024-02-06 ENCOUNTER — Institutional Professional Consult (permissible substitution): Payer: 59 | Admitting: Diagnostic Neuroimaging

## 2024-02-06 DIAGNOSIS — M79674 Pain in right toe(s): Secondary | ICD-10-CM | POA: Diagnosis not present

## 2024-02-06 DIAGNOSIS — M79675 Pain in left toe(s): Secondary | ICD-10-CM | POA: Diagnosis not present

## 2024-02-06 DIAGNOSIS — L03031 Cellulitis of right toe: Secondary | ICD-10-CM | POA: Diagnosis not present

## 2024-02-07 ENCOUNTER — Ambulatory Visit

## 2024-02-07 VITALS — BP 117/84 | HR 89 | Ht 66.0 in | Wt 162.1 lb

## 2024-02-07 DIAGNOSIS — E0590 Thyrotoxicosis, unspecified without thyrotoxic crisis or storm: Secondary | ICD-10-CM | POA: Diagnosis not present

## 2024-02-07 DIAGNOSIS — E1169 Type 2 diabetes mellitus with other specified complication: Secondary | ICD-10-CM | POA: Diagnosis not present

## 2024-02-07 NOTE — Progress Notes (Signed)
   Acute Office Visit  Subjective:     Patient ID: Kirk Oconnor, male    DOB: Jan 25, 1956, 68 y.o.   MRN: [id number]  Chief Complaint  Patient presents with   Medical Management of Chronic Issues    Pt states "He is having a possible medication reaction"    HPI Patient is in today for weakness and lightheadedness  Review of Systems  Constitutional:  Positive for diaphoresis and malaise/fatigue.  HENT: Negative.    Eyes: Negative.   Respiratory: Negative.    Cardiovascular:  Positive for palpitations (heart racing). Negative for chest pain.  Gastrointestinal: Negative.   Genitourinary: Negative.   Musculoskeletal: Negative.   Skin: Negative.   Neurological:  Positive for dizziness and headaches.  Psychiatric/Behavioral:  The patient is nervous/anxious.         Objective:    BP 117/84   Pulse 89   Ht 5\' 6"  (1.676 m)   Wt 162 lb 1.9 oz (73.5 kg)   SpO2 95%   BMI 26.17 kg/m  BP Readings from Last 3 Encounters:  02/07/24 117/84  01/22/24 (!) 147/96  01/17/24 106/73   Wt Readings from Last 3 Encounters:  02/07/24 162 lb 1.9 oz (73.5 kg)  01/22/24 162 lb 1.9 oz (73.5 kg)  01/17/24 161 lb 1.3 oz (73.1 kg)     Physical Exam Vitals and nursing note reviewed.  Constitutional:      Appearance: Normal appearance.  Eyes:     Extraocular Movements: Extraocular movements intact.     Pupils: Pupils are equal, round, and reactive to light.  Cardiovascular:     Rate and Rhythm: Normal rate and regular rhythm.  Pulmonary:     Effort: Pulmonary effort is normal.     Breath sounds: Normal breath sounds.  Musculoskeletal:     Cervical back: Normal range of motion and neck supple.  Neurological:     Mental Status: He is alert and oriented to person, place, and time.  Psychiatric:        Mood and Affect: Mood normal.        Thought Content: Thought content normal.     No results found for any visits on 02/07/24.      Assessment & Plan:   Problem List Items  Addressed This Visit       Endocrine   Type 2 diabetes mellitus (HCC) - Primary   BS in office today was normal, 108. suspect that his symptoms are r/t his thyroid  condition. recommend rechecking A1C in July       Hyperthyroidism   Suspect this is the source of his symptoms. he has a f/u appt with his endocrinologist next month. advised to keep this appointment.        No orders of the defined types were placed in this encounter.   No follow-ups on file.  Alison Irvine, FNP

## 2024-02-12 NOTE — Assessment & Plan Note (Signed)
 Suspect this is the source of his symptoms. he has a f/u appt with his endocrinologist next month. advised to keep this appointment.

## 2024-02-12 NOTE — Assessment & Plan Note (Signed)
 BS in office today was normal, 108. suspect that his symptoms are r/t his thyroid  condition. recommend rechecking A1C in July

## 2024-02-15 ENCOUNTER — Other Ambulatory Visit (HOSPITAL_COMMUNITY)
Admission: RE | Admit: 2024-02-15 | Discharge: 2024-02-15 | Disposition: A | Discharge status: Home / Self Care | Source: Ambulatory Visit | Attending: "Endocrinology | Admitting: "Endocrinology

## 2024-02-15 DIAGNOSIS — E0590 Thyrotoxicosis, unspecified without thyrotoxic crisis or storm: Secondary | ICD-10-CM | POA: Insufficient documentation

## 2024-02-15 LAB — LIPID PANEL
Cholesterol: 120 mg/dL (ref 0–200)
HDL: 33 mg/dL — ABNORMAL LOW (ref >40)
LDL Cholesterol: 74 mg/dL (ref 0–99)
Total CHOL/HDL Ratio: 3.6 ratio
Triglycerides: 65 mg/dL (ref <150)
VLDL: 13 mg/dL (ref 0–40)

## 2024-02-15 LAB — T4, FREE: Free T4: 1.51 ng/dL — ABNORMAL HIGH (ref 0.61–1.12)

## 2024-02-15 LAB — TSH: TSH: <0.01 u[IU]/mL — ABNORMAL LOW (ref 0.350–4.500)

## 2024-02-16 LAB — T3, FREE: T3, Free: 4.7 pg/mL — ABNORMAL HIGH (ref 2.0–4.4)

## 2024-02-22 ENCOUNTER — Other Ambulatory Visit: Payer: Self-pay | Admitting: Family Medicine

## 2024-02-22 ENCOUNTER — Other Ambulatory Visit: Payer: Self-pay | Admitting: *Deleted

## 2024-02-22 DIAGNOSIS — F321 Major depressive disorder, single episode, moderate: Secondary | ICD-10-CM

## 2024-02-22 NOTE — Patient Outreach (Signed)
 Complex Care Management   Visit Note  02/22/2024  Name:  Kirk Oconnor MRN: [id number] DOB: [birth year]  Situation: Referral received for Complex Care Management related to Diabetes with Complications I obtained verbal consent from Patient.  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   Arthritis    CAD (coronary artery disease)    Nonobstructive at cardiac catheterization July 2018 - [facility]   Carpal tunnel syndrome    Depression    Diabetes mellitus without complication (HCC)    Essential hypertension    GERD (gastroesophageal reflux disease)    Hepatitis C    Sleep apnea     Assessment: Patient Reported Symptoms:  Cognitive Cognitive Status: Alert and oriented to person, place, and time Cognitive/Intellectual Conditions Management [RPT]: None reported or documented in medical history or problem list   Health Maintenance Behaviors: Annual physical exam  Neurological Neurological Review of Symptoms: No symptoms reported    HEENT HEENT Symptoms Reported: Runny nose      Cardiovascular Cardiovascular Symptoms Reported: No symptoms reported Does patient have uncontrolled Hypertension?: No Cardiovascular Conditions: Hypertension Cardiovascular Management Strategies: Medication therapy  Respiratory Respiratory Symptoms Reported: No symptoms reported    Endocrine Patient reports the following symptoms related to hypoglycemia or hyperglycemia : No symptoms reported Is patient diabetic?: Yes Is patient checking blood sugars at home?: Yes Endocrine Conditions: Thyroid  disorder, Diabetes Endocrine Management Strategies: Medication therapy, Routine screening   Gastrointestinal Gastrointestinal Symptoms Reported: No symptoms reported   Nutrition Risk Screen (CP): No indicators present  Genitourinary Genitourinary Symptoms Reported: No symptoms reported Genitourinary Conditions: Frequency  Integumentary Integumentary Symptoms Reported: No symptoms reported    Musculoskeletal Musculoskelatal Symptoms Reviewed: Unsteady gait Musculoskeletal Conditions: Unsteady gait Musculoskeletal Management Strategies: Medical device Falls in the past year?: No Number of falls in past year: 1 or less Was there an injury with Fall?: No Fall Risk Category Calculator: 0 Patient Fall Risk Level: Low Fall Risk Patient at Risk for Falls Due to: No Fall Risks Fall risk Follow up: Falls evaluation completed  Psychosocial Psychosocial Symptoms Reported: Depression - if selected complete PHQ 2-9   Major Change/Loss/Stressor/Fears (CP): Medical condition, self Techniques to Cope with Loss/Stress/Change: Spiritual practice(s) Quality of Family Relationships: helpful, involved, supportive Do you feel physically threatened by others?: No      02/22/2024    9:40 AM  Depression screen PHQ 2/9  Decreased Interest 0  Down, Depressed, Hopeless 0  PHQ - 2 Score 0  Altered sleeping 0  Tired, decreased energy 1  Change in appetite 0  Feeling bad or failure about yourself  1  Trouble concentrating 0  Moving slowly or fidgety/restless 0  Suicidal thoughts 0  PHQ-9 Score 2  Difficult doing work/chores Not difficult at all    There were no vitals filed for this visit.  Medications Reviewed Today     Reviewed by Remona Carmel, RN (Registered Nurse) on 02/22/24 at 0932  Med List Status: <None>   Medication Order Taking? Sig Documenting Provider Last Dose Status Informant  acetaminophen  (TYLENOL ) 500 MG tablet [id number] Yes Take 500 mg by mouth every 6 (six) hours as needed. [provider] Taking Active  AMBULATORY NON FORMULARY MEDICATION [id number]   Vacuum erection device. Dispense 1. Lauretta Ponto, FNP  Active   amLODipine  (NORVASC ) 10 MG tablet [id number] Yes Take 1 tablet (10 mg total) by mouth daily. Del Amber Bail, Rogerio Clay, FNP Taking Active   aspirin  EC 81 MG tablet [id number] Yes Take 81 mg by mouth daily. [provider] Taking Active Family Member  Blood Glucose Monitoring Suppl DEVI [id number]  1 each by Does not apply route in the morning, at noon, and at bedtime. May substitute to any manufacturer covered by patient's insurance. Meldon Sport, MD  Active   Blood Pressure Monitor DEVI [id number]  1 each by Does not apply route daily. Del Abron Abt, FNP  Active   Blood Pressure Monitoring (BLOOD PRESSURE KIT) DEVI [id number]  Take blood pressure reading once daily Del Orbe Polanco, Iliana, FNP  Active   ciclopirox  (PENLAC ) 8 % solution [id number] Yes Apply topically at bedtime. Apply over nail and surrounding skin. Apply daily over previous coat. After seven (7) days, may remove with alcohol and continue cycle. Del Amber Bail, Rogerio Clay, FNP Taking Active   gabapentin  (NEURONTIN ) 300 MG capsule [id number] Yes TAKE 1 CAPSULE BY MOUTH EVERY 8 HOURS AS NEEDED Del Orbe Polanco, [place], FNP Taking Active   Glucose Blood (BLOOD GLUCOSE TEST STRIPS) STRP [id number]  1 each by In Vitro route in the morning, at noon, and at bedtime. May substitute to any manufacturer covered by patient's insurance. Patel, Rutwik K, MD  Active   ketoconazole (NIZORAL) 2 % cream [id number]  Apply 1 Application topically 2 (two) times daily. [provider]  Active   Lancet Device MISC [id number]  1 each by Does not apply route in the morning, at noon, and at bedtime. May substitute to any manufacturer covered by patient's insurance. Meldon Sport, MD  Active   Lancets Misc. MISC [id number]  1 each by Does not apply route in the morning, at noon, and at bedtime. May substitute to any manufacturer covered by patient's insurance. Meldon Sport,  MD  Active   losartan  (COZAAR ) 50 MG tablet [id number] Yes Take 1 tablet (50 mg total) by mouth daily. Meldon Sport, MD Taking Active   meclizine  (ANTIVERT ) 25 MG tablet [id number] No Take 1 tablet (25 mg total) by mouth 2 (two) times daily as needed for dizziness.  Patient not taking: Reported on 02/22/2024   Rosanna Comment, FNP Not Taking Consider Medication Status and Discontinue            Med Note (ROBB, MELANIE A   Mon Oct 23, 2023  4:33 PM) Did not like the way it made him feel  metFORMIN  (GLUCOPHAGE ) 500 MG tablet [id number] Yes Take 1 tablet by mouth once daily with breakfast Del Amber Bail, Rogerio Clay, FNP Taking Active   methimazole  (TAPAZOLE ) 5 MG tablet [id number] Yes Take 1 tablet by mouth once daily with breakfast Nida, Gebreselassie W, MD Taking Active   mirtazapine  (REMERON ) 7.5 MG tablet [id number] No Take 1 tablet (7.5 mg total) by mouth at bedtime.  Patient not taking: Reported on 02/22/2024   Meldon Sport, MD Not Taking Active   omeprazole  (PRILOSEC) 40 MG capsule [id number]  Take 1 capsule (40 mg total) by mouth 2 (two) times daily. Goble Last K, DO  Expired 12/15/23 2359            Med Note Bennett Brass, TAMMY   Mon Mar 27, 2023  3:32 PM)    pregabalin  (LYRICA )  50 MG capsule [id number] Yes Take 1 capsule (50 mg total) by mouth 2 (two) times daily. Del Amber Bail, Rogerio Clay, FNP Taking Active   rosuvastatin  (CRESTOR ) 5 MG tablet [id number] Yes Take 5 mg by mouth daily. [provider] Taking Active   sildenafil  (REVATIO ) 20 MG tablet [id number] Yes Take 1 tablet (20 mg total) by mouth daily. McKenzie, Arden Beck, MD Taking Active   sildenafil  (VIAGRA ) 100 MG tablet [id number] Yes Take 1 tablet (100 mg total) by mouth as needed for erectile dysfunction. McKenzie, Arden Beck, MD Taking Active   tiZANidine  (ZANAFLEX ) 4 MG tablet [id number] Yes TAKE 1 TABLET BY MOUTH EVERY 12 HOURS AS NEEDED FOR MUSCLE SPASM Del Orbe Polanco, Iliana, FNP Taking Active   traMADol  (ULTRAM )  50 MG tablet [id number] Yes Take 1 tablet (50 mg total) by mouth daily as needed. Del Abron Abt, FNP Taking Active            Med Note (ROBB, MELANIE A   Mon Oct 23, 2023  4:32 PM) Did not like the way it made him feel            Recommendation:   PCP Follow-up  Follow Up Plan:   Referral to Licensed Clinical Social Worker  Grandville Lax, BSN RN [facility]  [facility], [facility] RN Care Manager Direct Dial: [phone]  Fax: [phone]

## 2024-02-22 NOTE — Patient Instructions (Addendum)
 Visit Information  Thank you for taking time to visit with me today. Please don't hesitate to contact me if I can be of assistance to you before our next scheduled appointment.  Your next care management appointment is by telephone on 03-21-2024 at 9:00 am  Telephone follow-up in 1 month  Please call the care guide team at [phone] if you need to cancel, schedule, or reschedule an appointment.   Please call the Suicide and Crisis Lifeline: 988 call the [place]  National Suicide Prevention Lifeline: (914)247-5061 or TTY: 4586679442 TTY 623-115-0461) to talk to a trained counselor call 1-800-273-TALK (toll free, 24 hour hotline) call the [facility]: [phone] call 911 if you are experiencing a Mental Health or Behavioral Health Crisis or need someone to talk to.  Grandville Lax, BSN RN [facility], [facility] RN Care Manager Direct Dial: [phone]  Fax: [phone]  Hyperthyroidism Hyperthyroidism refers to a thyroid  gland that is too active or overactive. The thyroid  gland is a small gland located in the lower front part of the neck, just in front of the windpipe (trachea). This gland makes hormones that: Help control how the body uses food for energy (metabolism). Help the heart and brain work well. Keep your bones strong. When the thyroid  is overactive, it produces too much of a hormone called thyroxine. What are the causes? This condition may be caused by: Graves' disease. This is a disorder in which the body's disease-fighting system (immune system) attacks the thyroid  gland. This is the most common cause. Inflammation of the thyroid  gland. A tumor in the thyroid  gland. Use of certain medicines, including: Prescription thyroid  hormone replacement. Herbal supplements that mimic thyroid  hormones. Amiodarone therapy. Solid or fluid-filled lumps within your thyroid  gland (thyroid  nodules). Taking in a large amount  of iodine from foods or medicines. What increases the risk? You are more likely to develop this condition if: You are male. You have a family history of thyroid  conditions. You smoke tobacco. You use a medicine called lithium. You take medicines that affect the immune system (immunosuppressants). What are the signs or symptoms? Symptoms of this condition include: Nervousness. Inability to tolerate heat. Diarrhea. Rapid heart rate. Shaky hands. Restlessness. Sleep problems. Other symptoms may include: Heart skipping beats or making extra beats. Unexplained weight loss. Change in the texture of hair or skin. Loss of menstruation. Fatigue. Enlarged thyroid  gland or a lump in the thyroid  (nodule). You may also have symptoms of Graves' disease, which may include: Protruding eyes. Dry eyes. Red or swollen eyes. Problems with vision. How is this diagnosed? This condition may be diagnosed based on: Your symptoms and medical history. A physical exam. Blood tests. Thyroid  ultrasound. This test involves using sound waves to produce images of the thyroid  gland. A thyroid  scan. A radioactive substance is injected into a vein, and images show how much iodine is present in the thyroid . Radioactive iodine uptake test (RAIU). A small amount of radioactive iodine is given by mouth to see how much iodine the thyroid  absorbs after a certain amount of time. How is this treated? Treatment depends on the cause and severity of the condition. Treatment may include: Medicines to reduce the amount of thyroid  hormone your body makes. Medicines to help manage your symptoms. Radioactive iodine treatment (radioiodine therapy). This involves swallowing a small dose of radioactive iodine, in capsule or liquid form, to kill thyroid  cells. Surgery to remove part or all of your thyroid  gland. You may need to take  thyroid  hormone replacement medicine for the rest of your life after thyroid  surgery. Follow  these instructions at home:  Take over-the-counter and prescription medicines only as told by your health care provider. Do not use any products that contain nicotine or tobacco. These products include cigarettes, chewing tobacco, and vaping devices, such as e-cigarettes. If you need help quitting, ask your health care provider. Follow any instructions from your health care provider about diet. You may be instructed to limit foods that contain iodine. Keep all follow-up visits. You will need to have blood tests regularly so that your health care provider can monitor your condition. Where to find more information [employer] of Diabetes and Digestive and Kidney Diseases: StageSync.si Contact a health care provider if: Your symptoms do not get better with treatment. You have a fever. You have abdominal pain. You feel dizzy. You are taking thyroid  hormone replacement medicine and: You have symptoms of depression. You feel like you are tired all the time. You gain weight. Get help right away if: You have sudden, unexplained confusion or other mental changes. You have chest pain. You have fast or irregular heartbeats (palpitations). You have difficulty breathing. These symptoms may be an emergency. Get help right away. Call 911. Do not wait to see if the symptoms will go away. Do not drive yourself to the hospital. Summary The thyroid  gland is a small gland located in the lower front part of the neck, just in front of the windpipe. Hyperthyroidism is when the thyroid  gland is too active and produces too much of a hormone called thyroxine. The most common cause is Graves' disease, a disorder in which your immune system attacks the thyroid  gland. Hyperthyroidism can cause various symptoms, such as unexplained weight loss, nervousness, inability to tolerate heat, or changes in your heartbeat. Treatment may include medicine to reduce the amount of thyroid  hormone your body makes,  radioiodine therapy, surgery, or medicines to manage symptoms. This information is not intended to replace advice given to you by your health care provider. Make sure you discuss any questions you have with your health care provider. Document Revised: 11/05/2021 Document Reviewed: 11/05/2021 Elsevier Patient Education  2024 Elsevier Inc.  Carbohydrate Counting for Diabetes Mellitus, Adult Carbohydrate counting is a method of keeping track of how many carbohydrates you eat. Eating carbohydrates increases the amount of sugar (glucose) in the blood. Counting how many carbohydrates you eat improves how well you manage your blood glucose. This, in turn, helps you manage your diabetes. Carbohydrates are measured in grams (g) per serving. It is important to know how many carbohydrates (in grams or by serving size) you can have in each meal. This is different for every person. A dietitian can help you make a meal plan and calculate how many carbohydrates you should have at each meal and snack. What foods contain carbohydrates? Carbohydrates are found in the following foods: Grains, such as breads and cereals. Dried beans and soy products. Starchy vegetables, such as potatoes, peas, and corn. Fruit and fruit juices. Milk and yogurt. Sweets and snack foods, such as cake, cookies, candy, chips, and soft drinks. How do I count carbohydrates in foods? There are two ways to count carbohydrates in food. You can read food labels or learn standard serving sizes of foods. You can use either of these methods or a combination of both. Using the Nutrition Facts label The Nutrition Facts list is included on the labels of almost all packaged foods and beverages in the [place] .  It includes: The serving size. Information about nutrients in each serving, including the grams of carbohydrate per serving. To use the Nutrition Facts, decide how many servings you will have. Then, multiply the number of servings by the  number of carbohydrates per serving. The resulting number is the total grams of carbohydrates that you will be having. Learning the standard serving sizes of foods When you eat carbohydrate foods that are not packaged or do not include Nutrition Facts on the label, you need to measure the servings in order to count the grams of carbohydrates. Measure the foods that you will eat with a food scale or measuring cup, if needed. Decide how many standard-size servings you will eat. Multiply the number of servings by 15. For foods that contain carbohydrates, one serving equals 15 g of carbohydrates. For example, if you eat 2 cups or 10 oz (300 g) of strawberries, you will have eaten 2 servings and 30 g of carbohydrates (2 servings x 15 g = 30 g). For foods that have more than one food mixed, such as soups and casseroles, you must count the carbohydrates in each food that is included. The following list contains standard serving sizes of common carbohydrate-rich foods. Each of these servings has about 15 g of carbohydrates: 1 slice of bread. 1 six-inch (15 cm) tortilla. ? cup or 2 oz (53 g) cooked rice or pasta.  cup or 3 oz (85 g) cooked or canned, drained and rinsed beans or lentils.  cup or 3 oz (85 g) starchy vegetable, such as peas, corn, or squash.  cup or 4 oz (120 g) hot cereal.  cup or 3 oz (85 g) boiled or mashed potatoes, or  or 3 oz (85 g) of a large baked potato.  cup or 4 fl oz (118 mL) fruit juice. 1 cup or 8 fl oz (237 mL) milk. 1 small or 4 oz (106 g) apple.  or 2 oz (63 g) of a medium banana. 1 cup or 5 oz (150 g) strawberries. 3 cups or 1 oz (28.3 g) popped popcorn. What is an example of carbohydrate counting? To calculate the grams of carbohydrates in this sample meal, follow the steps shown below. Sample meal 3 oz (85 g) chicken breast. ? cup or 4 oz (106 g) brown rice.  cup or 3 oz (85 g) corn. 1 cup or 8 fl oz (237 mL) milk. 1 cup or 5 oz (150 g) strawberries with  sugar-free whipped topping. Carbohydrate calculation Identify the foods that contain carbohydrates: Rice. Corn. Milk. Strawberries. Calculate how many servings you have of each food: 2 servings rice. 1 serving corn. 1 serving milk. 1 serving strawberries. Multiply each number of servings by 15 g: 2 servings rice x 15 g = 30 g. 1 serving corn x 15 g = 15 g. 1 serving milk x 15 g = 15 g. 1 serving strawberries x 15 g = 15 g. Add together all of the amounts to find the total grams of carbohydrates eaten: 30 g + 15 g + 15 g + 15 g = 75 g of carbohydrates total. What are tips for following this plan? Shopping Develop a meal plan and then make a shopping list. Buy fresh and frozen vegetables, fresh and frozen fruit, dairy, eggs, beans, lentils, and whole grains. Look at food labels. Choose foods that have more fiber and less sugar. Avoid processed foods and foods with added sugars. Meal planning Aim to have the same number of grams of carbohydrates  at each meal and for each snack time. Plan to have regular, balanced meals and snacks. Where to find more information American Diabetes Association: diabetes.org Centers for Disease Control and Prevention: TonerPromos.no Academy of Nutrition and Dietetics: eatright.org Association of Diabetes Care & Education Specialists: diabeteseducator.org Summary Carbohydrate counting is a method of keeping track of how many carbohydrates you eat. Eating carbohydrates increases the amount of sugar (glucose) in your blood. Counting how many carbohydrates you eat improves how well you manage your blood glucose. This helps you manage your diabetes. A dietitian can help you make a meal plan and calculate how many carbohydrates you should have at each meal and snack. This information is not intended to replace advice given to you by your health care provider. Make sure you discuss any questions you have with your health care provider. Document Revised: 04/14/2020  Document Reviewed: 04/15/2020 Elsevier Patient Education  2024 [employer].

## 2024-02-27 DIAGNOSIS — M79675 Pain in left toe(s): Secondary | ICD-10-CM | POA: Diagnosis not present

## 2024-02-27 DIAGNOSIS — L03032 Cellulitis of left toe: Secondary | ICD-10-CM | POA: Diagnosis not present

## 2024-02-28 ENCOUNTER — Ambulatory Visit (INDEPENDENT_AMBULATORY_CARE_PROVIDER_SITE_OTHER): Admitting: "Endocrinology

## 2024-02-28 ENCOUNTER — Other Ambulatory Visit: Payer: Self-pay | Admitting: Family Medicine

## 2024-02-28 ENCOUNTER — Other Ambulatory Visit: Payer: Self-pay | Admitting: Licensed Clinical Social Worker

## 2024-02-28 ENCOUNTER — Encounter: Payer: Self-pay | Admitting: "Endocrinology

## 2024-02-28 VITALS — BP 100/76 | HR 76 | Ht 66.0 in | Wt 161.2 lb

## 2024-02-28 DIAGNOSIS — E1165 Type 2 diabetes mellitus with hyperglycemia: Secondary | ICD-10-CM

## 2024-02-28 DIAGNOSIS — E0590 Thyrotoxicosis, unspecified without thyrotoxic crisis or storm: Secondary | ICD-10-CM

## 2024-02-28 DIAGNOSIS — Z794 Long term (current) use of insulin: Secondary | ICD-10-CM

## 2024-02-28 LAB — POCT GLYCOSYLATED HEMOGLOBIN (HGB A1C): HbA1c, POC (controlled diabetic range): 6.5 % (ref 0.0–7.0)

## 2024-02-28 MED ORDER — METHIMAZOLE 5 MG PO TABS: 5.0000 mg | ORAL_TABLET | Freq: Two times a day (BID) | ORAL | 0 refills | Status: DC

## 2024-02-28 MED ORDER — METHIMAZOLE 5 MG PO TABS: 5.0000 mg | ORAL_TABLET | Freq: Two times a day (BID) | ORAL | 1 refills | Status: DC

## 2024-02-28 NOTE — Patient Instructions (Signed)
 Visit Information  Thank you for taking time to visit with me today. Please don't hesitate to contact me if I can be of assistance to you before our next scheduled appointment.  Your next care management appointment is by telephone on 03/13/2024 at 230pm  Telephone follow up appointment date/time:  03/13/2024 230pm  Please call the care guide team at [phone] if you need to cancel, schedule, or reschedule an appointment.   Please call 911 if you are experiencing a Mental Health or Behavioral Health Crisis or need someone to talk to.  Fletcher Humble MSW, LCSW Licensed Clinical Social Worker  [facility], Population Health Direct Dial: [phone]  Fax: [phone]

## 2024-02-28 NOTE — Patient Outreach (Signed)
 Complex Care Management   Visit Note  02/28/2024  Name:  Kirk Oconnor MRN: [id number] DOB: Dec 17, 1955  Situation: Referral received for Complex Care Management related to Mental/Behavioral Health diagnosis anxiety I obtained verbal consent from Patient.  Visit completed with Kirk Oconnor  on the phone. Kirk Oconnor reports dissatisfaction with recent endocrinologist visit. Kirk Oconnor reports worry regarding his recent diagnosis.    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   Arthritis    CAD (coronary artery disease)    Nonobstructive at cardiac catheterization July 2018 - [facility]   Carpal tunnel syndrome    Depression    Diabetes mellitus without complication (HCC)    Essential hypertension    GERD (gastroesophageal reflux disease)    Hepatitis C    Sleep apnea     Assessment: Patient Reported Symptoms:  Cognitive Cognitive Status: Able to follow simple commands, Alert and oriented to person, place, and time Cognitive/Intellectual Conditions Management [RPT]: None reported or documented in medical history or problem list      Neurological Neurological Review of Symptoms: Not assessed    HEENT HEENT Symptoms Reported: No symptoms reported      Cardiovascular Cardiovascular Symptoms Reported: No symptoms reported Does patient have uncontrolled Hypertension?: No Cardiovascular Conditions: Hypertension  Respiratory Respiratory Symptoms Reported: No symptoms reported    Endocrine Patient reports the following symptoms related to hypoglycemia or hyperglycemia : No symptoms reported Is patient diabetic?: Yes Is patient checking blood sugars at home?: Yes Endocrine Conditions: Diabetes, Thyroid  disorder Endocrine  Management Strategies: Medication therapy, Routine screening  Gastrointestinal Gastrointestinal Symptoms Reported: No symptoms reported      Genitourinary Genitourinary Symptoms Reported: No symptoms reported    Integumentary Integumentary Symptoms Reported: No symptoms reported    Musculoskeletal Musculoskelatal Symptoms Reviewed: Unsteady gait Musculoskeletal Conditions: Unsteady gait Falls in the past year?: No Number of falls in past year: 1 or less Was there an injury with Fall?: No Fall Risk Category Calculator: 0 Patient Fall Risk Level: Low Fall Risk    Psychosocial       Quality of Family Relationships: helpful, supportive, involved Do you feel physically threatened by others?: No      02/28/2024    2:54 PM  Depression screen PHQ 2/9  Decreased Interest 3  Down, Depressed, Hopeless 3  PHQ - 2 Score 6  Altered sleeping 0  Tired, decreased energy 1  Change in appetite 0  Feeling bad or failure about yourself  3  Trouble concentrating 0  Moving slowly or fidgety/restless 0  Suicidal thoughts 0  PHQ-9 Score 10    There were no vitals filed for this visit.  Medications Reviewed Today     Reviewed by Fletcher Humble, LCSW (Social Worker) on 02/28/24 at 1447  Med List Status: <None>   Medication Order Taking? Sig Documenting Provider Last Dose Status Informant  acetaminophen  (TYLENOL ) 500 MG tablet [id number] No Take 500 mg by mouth every 6 (six) hours as needed. [provider] Taking Active   AMBULATORY NON FORMULARY MEDICATION [id number] No Vacuum erection device. Dispense 1. Lauretta Ponto, FNP Taking Active   amLODipine  (NORVASC ) 10 MG tablet [id number]  Take 1 tablet by mouth once daily Del Orbe Polanco,  Rogerio Clay, FNP  Active   aspirin  EC 81 MG tablet [id number] No Take 81 mg by mouth daily. [provider] Taking Active Family Member  Blood Glucose Monitoring Suppl DEVI [id number] No 1 each by Does not apply route in the morning, at noon, and at  bedtime. May substitute to any manufacturer covered by patient's insurance. Meldon Sport, MD Taking Active   Blood Pressure Monitor DEVI [id number] No 1 each by Does not apply route daily. Del Abron Abt, FNP Taking Active   Blood Pressure Monitoring (BLOOD PRESSURE KIT) DEVI [id number] No Take blood pressure reading once daily Del Amber Bail, Colby, FNP Taking Active   ciclopirox  (PENLAC ) 8 % solution [id number] No Apply topically at bedtime. Apply over nail and surrounding skin. Apply daily over previous coat. After seven (7) days, may remove with alcohol and continue cycle. Del Amber Bail, Rogerio Clay, FNP Taking Active   gabapentin  (NEURONTIN ) 300 MG capsule [id number]  TAKE 1 CAPSULE BY MOUTH EVERY 8 HOURS AS NEEDED Del Orbe Polanco, Iliana, FNP  Active   Glucose Blood (BLOOD GLUCOSE TEST STRIPS) STRP [id number] No 1 each by In Vitro route in the morning, at noon, and at bedtime. May substitute to any manufacturer covered by patient's insurance. Meldon Sport, MD Taking Active   ketoconazole (NIZORAL) 2 % cream [id number] No Apply 1 Application topically 2 (two) times daily. [provider] Taking Active   Lancet Device MISC [id number] No 1 each by Does not apply route in the morning, at noon, and at bedtime. May substitute to any manufacturer covered by patient's insurance. Meldon Sport, MD Taking Active   Lancets Misc. MISC [id number] No 1 each by Does not apply route in the morning, at noon, and at bedtime. May substitute to any manufacturer covered by patient's insurance. Meldon Sport, MD Taking Active   losartan  (COZAAR ) 50 MG tablet [id number] No Take 1 tablet (50 mg total) by mouth daily. Meldon Sport, MD Taking Active   meclizine  (ANTIVERT ) 25 MG tablet [id number] No Take 1 tablet (25 mg total) by mouth 2 (two) times daily as needed for dizziness.  Patient not taking: Reported on 02/22/2024   Rosanna Comment, FNP Not Taking Active            Med Note (ROBB,  MELANIE A   Mon Oct 23, 2023  4:33 PM) Did not like the way it made him feel  metFORMIN  (GLUCOPHAGE ) 500 MG tablet [id number] No Take 1 tablet by mouth once daily with breakfast Del Amber Bail, Gardiner, FNP Taking Active   methimazole  (TAPAZOLE ) 5 MG tablet [id number]  Take 1 tablet (5 mg total) by mouth 2 (two) times daily with a meal. With breakfast and supper Nida, Gebreselassie W, MD  Active   mirtazapine  (REMERON ) 7.5 MG tablet [id number] No Take 1 tablet (7.5 mg total) by mouth at bedtime.  Patient not taking: Reported on 02/22/2024   Meldon Sport, MD Not Taking Active   omeprazole  [facility]) 40 MG capsule [id number] No Take 1 capsule (40 mg total) by mouth 2 (two) times daily. Vinetta Greening, DO Taking Expired 12/15/23 2359            Med Note Bennett Brass, TAMMY   Mon Mar 27, 2023  3:32 PM)    pregabalin  (LYRICA ) 50 MG capsule [id number] No Take 1 capsule (50 mg total) by mouth 2 (two) times daily. Del Amber Bail, Rogerio Clay, FNP Taking Active   rosuvastatin  (CRESTOR ) 5 MG tablet [id number]  No Take 5 mg by mouth daily. [provider] Taking Active   sildenafil  (REVATIO ) 20 MG tablet [id number] No Take 1 tablet (20 mg total) by mouth daily. McKenzie, Arden Beck, MD Taking Active   sildenafil  (VIAGRA ) 100 MG tablet [id number] No Take 1 tablet (100 mg total) by mouth as needed for erectile dysfunction. McKenzie, Arden Beck, MD Taking Active   tiZANidine  (ZANAFLEX ) 4 MG tablet [id number] No TAKE 1 TABLET BY MOUTH EVERY 12 HOURS AS NEEDED FOR MUSCLE SPASM Del Orbe Polanco, Rogerio Clay, FNP Taking Active   traMADol  (ULTRAM ) 50 MG tablet [id number] No Take 1 tablet (50 mg total) by mouth daily as needed. Del Abron Abt, FNP Taking Active            Med Note (ROBB, MELANIE A   Mon Oct 23, 2023  4:32 PM) Did not like the way it made him feel            Recommendation:   Kirk. Dufresne request information for a different endocrinologist. Kirk Shuck was provided contact information for [facility]  endocrinology in [place]. Kirk. Brannen is encouraged to consider therapy however he declined. Ms. Ferris is encouraged to engaged in self care, continue with current endocrinologist until he can secure an appt with another provider and prioritize rest.   Follow Up Plan:   Telephone follow up appointment date/time:  03/13/2024  Fletcher Humble MSW, LCSW Licensed Clinical Social Worker  [facility] Population Health Direct Dial: [phone]  Fax: [phone]

## 2024-02-28 NOTE — Progress Notes (Signed)
 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   Arthritis    CAD (coronary artery disease)    Nonobstructive at cardiac catheterization July 2018 - [facility]   Carpal tunnel syndrome    Depression    Diabetes mellitus without complication (HCC)    Essential hypertension    GERD (gastroesophageal reflux disease)    Hepatitis C    Sleep apnea    Past Surgical History:  Procedure Laterality Date   AORTIC VALVE REPLACEMENT  03/2017   [facility]   BIOPSY  12/15/2022   Procedure: BIOPSY;  Surgeon: Vinetta Greening, DO;  [facility];  Service: Endoscopy;;   COLONOSCOPY WITH PROPOFOL  N/A 12/15/2022   Procedure: COLONOSCOPY WITH PROPOFOL ;  Surgeon: Vinetta Greening, DO;  Location[facility];  Service: Endoscopy;  Laterality: N/A;  10:00 am, asa 3   ESOPHAGOGASTRODUODENOSCOPY (EGD) WITH PROPOFOL  N/A 12/15/2022   Procedure: ESOPHAGOGASTRODUODENOSCOPY (EGD) WITH PROPOFOL ;  Surgeon: Vinetta Greening, DO;  [facility];  Service: Endoscopy;  Laterality: N/A;   POLYPECTOMY  12/15/2022   Procedure: POLYPECTOMY;  Surgeon: Vinetta Greening, DO;  Location: [facility];  Service: Endoscopy;;   Social History   Socioeconomic History   Marital status: Divorced    Spouse name: Not on file   Number of children: Not on file   Years of education: Not on file   Highest education level: Not on file  Occupational History   Not on file  Tobacco Use   Smoking status: Former    Types: Cigarettes   Smokeless tobacco:  Never  Vaping Use   Vaping status: Never Used  Substance and Sexual Activity   Alcohol use: Yes    Comment: socially   Drug use: No   Sexual activity: Yes  Other Topics Concern   Not on file  Social History Narrative   Not on file   Social Drivers of Health   Financial Resource Strain: Low Risk  (08/22/2023)   Overall Financial Resource Strain (CARDIA)    Difficulty of Paying Living Expenses: Not very hard  Food Insecurity: No Food Insecurity (01/23/2024)   Hunger Vital Sign    Worried About Running Out of Food in the Last Year: Never true    Ran Out of Food in the Last Year: Never true  Transportation Needs: No Transportation Needs (01/23/2024)   PRAPARE - Administrator, Civil Service (Medical): No    Lack of Transportation (Non-Medical): No  Physical Activity: Not on file  Stress: Not on file  Social Connections: Not on file   Family History  Problem Relation Age of Onset   Diabetes Mother    Heart attack Mother    Heart attack Father    Outpatient  Encounter Medications as of 02/28/2024  Medication Sig   acetaminophen  (TYLENOL ) 500 MG tablet Take 500 mg by mouth every 6 (six) hours as needed.   AMBULATORY NON FORMULARY MEDICATION Vacuum erection device. Dispense 1.   amLODipine  (NORVASC ) 10 MG tablet Take 1 tablet by mouth once daily   aspirin  EC 81 MG tablet Take 81 mg by mouth daily.   Blood Glucose Monitoring Suppl DEVI 1 each by Does not apply route in the morning, at noon, and at bedtime. May substitute to any manufacturer covered by patient's insurance.   Blood Pressure Monitor DEVI 1 each by Does not apply route daily.   Blood Pressure Monitoring (BLOOD PRESSURE KIT) DEVI Take blood pressure reading once daily   ciclopirox  (PENLAC ) 8 % solution Apply topically at bedtime. Apply over nail and surrounding skin. Apply daily over previous coat. After seven (7) days, may remove with alcohol and continue cycle.   gabapentin  (NEURONTIN ) 300 MG capsule TAKE 1  CAPSULE BY MOUTH EVERY 8 HOURS AS NEEDED   Glucose Blood (BLOOD GLUCOSE TEST STRIPS) STRP 1 each by In Vitro route in the morning, at noon, and at bedtime. May substitute to any manufacturer covered by patient's insurance.   ketoconazole (NIZORAL) 2 % cream Apply 1 Application topically 2 (two) times daily.   Lancet Device MISC 1 each by Does not apply route in the morning, at noon, and at bedtime. May substitute to any manufacturer covered by patient's insurance.   Lancets Misc. MISC 1 each by Does not apply route in the morning, at noon, and at bedtime. May substitute to any manufacturer covered by patient's insurance.   losartan  (COZAAR ) 50 MG tablet Take 1 tablet (50 mg total) by mouth daily.   meclizine  (ANTIVERT ) 25 MG tablet Take 1 tablet (25 mg total) by mouth 2 (two) times daily as needed for dizziness. (Patient not taking: Reported on 02/22/2024)   metFORMIN  (GLUCOPHAGE ) 500 MG tablet Take 1 tablet by mouth once daily with breakfast   methimazole  (TAPAZOLE ) 5 MG tablet Take 1 tablet (5 mg total) by mouth 2 (two) times daily with a meal. With breakfast and supper   mirtazapine  (REMERON ) 7.5 MG tablet Take 1 tablet (7.5 mg total) by mouth at bedtime. (Patient not taking: Reported on 02/22/2024)   omeprazole  (PRILOSEC) 40 MG capsule Take 1 capsule (40 mg total) by mouth 2 (two) times daily.   pregabalin  (LYRICA ) 50 MG capsule Take 1 capsule (50 mg total) by mouth 2 (two) times daily.   rosuvastatin  (CRESTOR ) 5 MG tablet Take 5 mg by mouth daily.   sildenafil  (REVATIO ) 20 MG tablet Take 1 tablet (20 mg total) by mouth daily.   sildenafil  (VIAGRA ) 100 MG tablet Take 1 tablet (100 mg total) by mouth as needed for erectile dysfunction.   tiZANidine  (ZANAFLEX ) 4 MG tablet TAKE 1 TABLET BY MOUTH EVERY 12 HOURS AS NEEDED FOR MUSCLE SPASM   traMADol  (ULTRAM ) 50 MG tablet Take 1 tablet (50 mg total) by mouth daily as needed.   [DISCONTINUED] methimazole  (TAPAZOLE ) 5 MG tablet Take 1 tablet by mouth once  daily with breakfast   [DISCONTINUED] methimazole  (TAPAZOLE ) 5 MG tablet Take 1 tablet (5 mg total) by mouth 2 (two) times daily with a meal. With breakfast and supper   No facility-administered encounter medications on file as of 02/28/2024.   ALLERGIES: No Known Allergies  VACCINATION STATUS: Immunization History  Administered Date(s) Administered   Fluad Trivalent(High Dose 65+) 06/19/2023   Td 03/04/2019  HPI WHALEN TROMPETER is 68 y.o. male who presents today with a medical history as above. he is being seen in follow-up after he was seen in consultation for hyperthyroidism requested by Del Abron Abt, FNP.   Patient is not an optimal historian, came alone today.   He was diagnosed with hyperthyroidism in April 2024.    -He is currently on minimal dose of methimazole  5 mg p.o. daily at breakfast.  His previsit thyroid  function tests are consistent with still uncontrolled hypothyroidism.  Patient presents with some heat intolerance and intermittent sweating.  He denies palpitations, and his weight is stabilizing. His thyroid  uptake and scan showed 35% uptake at 24 hours.  He denies family history of thyroid  dysfunction.  His other medical problems include hypertension on treatment with amlodipine .  He also has type 2 diabetes currently on metformin  500 mg p.o. once a day.  His point-of-care A1c is 6.5%, progressively improving from 7.1%.    He also has high blood pressure and hypercholesterolemia on treatment.    He denies dysphagia, shortness of breath, nor voice change.  He denies any personal history of goiter. Patient is chronic smoker.  Review of Systems  Constitutional: + Mildly fluctuating body weight,   + fatigue, no subjective hyperthermia, no subjective hypothermia   Objective:       02/28/2024   10:35 AM 02/07/2024    2:36 PM 01/22/2024   10:36 AM  Vitals with BMI  Height 5\' 6"  5\' 6"    Weight 161 lbs 3 oz 162 lbs 2 oz   BMI 26.03 26.18   Systolic 100  117 147  Diastolic 76 84 96  Pulse 76 89     BP 100/76   Pulse 76   Ht 5\' 6"  (1.676 m)   Wt 161 lb 3.2 oz (73.1 kg)   BMI 26.02 kg/m   Wt Readings from Last 3 Encounters:  02/28/24 161 lb 3.2 oz (73.1 kg)  02/07/24 162 lb 1.9 oz (73.5 kg)  01/22/24 162 lb 1.9 oz (73.5 kg)    Physical Exam  Constitutional:  Body mass index is 26.02 kg/m.,  not in acute distress, normal state of mind Eyes: PERRLA, EOMI, no exophthalmos ENT: moist mucous membranes, no gross thyromegaly, no gross cervical lymphadenopathy    CMP ( most recent) CMP     Component Value Date/Time   NA 138 01/17/2024 1142   K 4.5 01/17/2024 1142   CL 101 01/17/2024 1142   CO2 22 01/17/2024 1142   GLUCOSE 92 01/17/2024 1142   GLUCOSE 107 (H) 09/29/2023 1145   BUN 17 01/17/2024 1142   CREATININE 1.14 01/17/2024 1142   CREATININE 0.98 03/11/2013 1036   CALCIUM  10.0 01/17/2024 1142   PROT 6.9 01/17/2024 1142   ALBUMIN 4.2 01/17/2024 1142   AST 23 01/17/2024 1142   ALT 32 01/17/2024 1142   ALKPHOS 89 01/17/2024 1142   BILITOT 0.8 01/17/2024 1142   EGFR 71 01/17/2024 1142   GFRNONAA >60 09/29/2023 1145   GFRNONAA 87 03/11/2013 1036     Diabetic Labs (most recent): Lab Results  Component Value Date   HGBA1C 6.5 02/28/2024   HGBA1C 6.6 (H) 10/19/2023   HGBA1C 6.6 (H) 07/12/2023   MICROALBUR 6.0 (H) 01/27/2022     Lipid Panel ( most recent) Lipid Panel     Component Value Date/Time   CHOL 120 02/15/2024 1148   CHOL 130 01/17/2024 1142   TRIG 65 02/15/2024 1148   HDL 33 (L) 02/15/2024 1148  HDL 33 (L) 01/17/2024 1142   CHOLHDL 3.6 02/15/2024 1148   VLDL 13 02/15/2024 1148   LDLCALC 74 02/15/2024 1148   LDLCALC 82 01/17/2024 1142   LABVLDL 15 01/17/2024 1142      Lab Results  Component Value Date   TSH <0.010 (L) 02/15/2024   TSH 0.022 (L) 09/29/2023   TSH 0.075 (L) 04/05/2023   TSH 0.026 (L) 03/27/2023   TSH <0.005 (L) 01/30/2023   FREET4 1.51 (H) 02/15/2024   FREET4 1.03 09/29/2023    FREET4 0.86 04/05/2023   FREET4 1.07 03/27/2023   FREET4 1.76 01/30/2023       Assessment & Plan:   1. Hyperthyroidism  2.  Hypertension   3.  Hyperlipidemia 4.  Type 2 diabetes  - I have reviewed his new and available thyroid  records and clinically evaluated the patient along with the presence of his sister in the room.   -In light of his questionable compliance, ablating his thyroid  will create a chronic condition which he may have difficulty following up with.  Given the low uptake of 35%, I offered him none definitive treatment with methimazole  in lieu of RAI ablation.   He presents with thyroid  function test still above target, advised to increase methimazole  to 5 mg p.o. twice daily with breakfast and supper with plan to repeat thyroid  function test and office visit in 4 months.     Side effects and precautions with this medicine were discussed with the family. His body weight is stabilizing, however, his point-of-care A1c is 6.5%.  He is advised to continue metformin  500 mg p.o. daily at breakfast.    For his hyperlipidemia and hypertension, he is advised to continue rosuvastatin , amlodipine  as ordered.   The patient was counseled on the dangers of tobacco use, and was advised to quit.  Reviewed strategies to maximize success, including removing cigarettes and smoking materials from environment.   - he is advised to maintain close follow up with Del Abron Abt, FNP for primary care needs.   I spent  22  minutes in the care of the patient today including review of labs from Thyroid  Function, CMP, and other relevant labs ; imaging/biopsy records (current and previous including abstractions from other facilities); face-to-face time discussing  his lab results and symptoms, medications doses, his options of short and long term treatment based on the latest standards of care / guidelines;   and documenting the encounter.  Kirk Oconnor  participated in the discussions,  expressed understanding, and voiced agreement with the above plans.  All questions were answered to his satisfaction. he is encouraged to contact clinic should he have any questions or concerns prior to his return visit.   Follow up plan: Return in about 4 months (around 06/29/2024) for F/U with Pre-visit Labs, A1c -NV.   Kalvin Orf, MD [facility] [address] [place], [place] [address] Phone: [phone]  Fax: [phone]     02/28/2024, 11:12 AM  This note was partially dictated with voice recognition software. Similar sounding words can be transcribed inadequately or may not  be corrected upon review.

## 2024-02-28 NOTE — Patient Instructions (Signed)

## 2024-02-29 ENCOUNTER — Telehealth: Admitting: Licensed Clinical Social Worker

## 2024-03-05 ENCOUNTER — Telehealth: Payer: Self-pay

## 2024-03-05 NOTE — Telephone Encounter (Signed)
 Referral pending, I placed a referral last week

## 2024-03-05 NOTE — Telephone Encounter (Signed)
 Copied from CRM [id number]. Topic: Referral - Question >> Mar 04, 2024  4:53 PM Phil Braun wrote: Reason for CRM:   Thyroid  dr.   Francetta Innocent is calling in to see if he can get an appt with another thyroid  dr. Please call patient and advise.

## 2024-03-07 ENCOUNTER — Encounter: Payer: Self-pay | Admitting: Cardiology

## 2024-03-13 ENCOUNTER — Other Ambulatory Visit: Payer: Self-pay | Admitting: Licensed Clinical Social Worker

## 2024-03-14 NOTE — Patient Instructions (Signed)
 Visit Information  Thank you for taking time to visit with me today. Please don't hesitate to contact me if I can be of assistance to you before our next scheduled appointment.  Your next care management appointment is by telephone on 03/21/2024 RNCM at 9am  Patient has met all care management goals. Care Management case will be closed. Patient has been provided contact information should new needs arise.   Please call the care guide team at [phone] if you need to cancel, schedule, or reschedule an appointment.   Please call 911 if you are experiencing a Mental Health or Behavioral Health Crisis or need someone to talk to.  Fletcher Humble MSW, LCSW Licensed Clinical Social Worker  [facility], Population Health Direct Dial: [phone]  Fax: [phone]

## 2024-03-14 NOTE — Patient Outreach (Signed)
 Complex Care Management   Visit Note  03/14/2024  Name:  Kirk Oconnor MRN: [id number] DOB: 1955/10/23  Situation: Referral received for Complex Care Management related to Mental/Behavioral Health diagnosis anxiety I obtained verbal consent from Patient.  Visit completed with Gentry Kief   on the phone. Mr. Hillis is provided with contact information for [facility] endocrinology in [place]. Mr. Derrick reports he does not like his current endocrinologist and requested contact information for an alternate.   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   Arthritis    CAD (coronary artery disease)    Nonobstructive at cardiac catheterization July 2018 - [facility]   Carpal tunnel syndrome    Depression    Diabetes mellitus without complication (HCC)    Essential hypertension    GERD (gastroesophageal reflux disease)    Hepatitis C    Sleep apnea     Assessment: Patient Reported Symptoms:  Cognitive Cognitive Status: Able to follow simple commands, Alert and oriented to person, place, and time, Normal speech and language skills Cognitive/Intellectual Conditions Management [RPT]: None reported or documented in medical history or problem list   Health Maintenance Behaviors: Annual physical exam Health Facilitated by: Rest  Neurological Neurological Review of Symptoms: No symptoms reported    HEENT HEENT Symptoms Reported: No symptoms reported      Cardiovascular Cardiovascular Symptoms Reported: No symptoms reported Does patient have uncontrolled Hypertension?: No Cardiovascular Conditions: Hypertension Cardiovascular Management Strategies: Medication therapy  Respiratory Respiratory Symptoms Reported: No symptoms reported     Endocrine Patient reports the following symptoms related to hypoglycemia or hyperglycemia : No symptoms reported Is patient diabetic?: Yes Is patient checking blood sugars at home?: Yes Endocrine Conditions: Diabetes Endocrine Management Strategies: Medication therapy  Gastrointestinal Gastrointestinal Symptoms Reported: No symptoms reported      Genitourinary Genitourinary Symptoms Reported: No symptoms reported    Integumentary Integumentary Symptoms Reported: No symptoms reported    Musculoskeletal Musculoskelatal Symptoms Reviewed: No symptoms reported   Falls in the past year?: No Number of falls in past year: 1 or less Was there an injury with Fall?: No Fall Risk Category Calculator: 0 Patient Fall Risk Level: Low Fall Risk Patient at Risk for Falls Due to: No Fall Risks  Psychosocial       Quality of Family Relationships: helpful, involved, supportive Do you feel physically threatened by others?: No      02/28/2024    2:54 PM  Depression screen PHQ 2/9  Decreased Interest 3  Down, Depressed, Hopeless 3  PHQ - 2 Score 6  Altered sleeping 0  Tired, decreased energy 1  Change in appetite 0  Feeling bad or failure about yourself  3  Trouble concentrating 0  Moving slowly or fidgety/restless 0  Suicidal thoughts 0  PHQ-9 Score 10    There were no vitals filed for this visit.  Medications Reviewed Today     Reviewed by Fletcher Humble, LCSW (Social Worker) on 03/14/24 at 1540  Med List Status: <None>   Medication Order Taking? Sig Documenting Provider Last Dose Status Informant  acetaminophen  (TYLENOL ) 500 MG tablet [id number] No Take 500 mg by mouth every 6 (six) hours as needed. [provider] Taking Active   AMBULATORY  NON FORMULARY MEDICATION [id number] No Vacuum erection device. Dispense 1. Lauretta Ponto, FNP Taking Active   amLODipine  (NORVASC ) 10 MG tablet [id number]  Take 1 tablet by mouth once daily Del Orbe Polanco, [place], FNP  Active    aspirin  EC 81 MG tablet [id number] No Take 81 mg by mouth daily. [provider] Taking Active Family Member  Blood Glucose Monitoring Suppl DEVI [id number] No 1 each by Does not apply route in the morning, at noon, and at bedtime. May substitute to any manufacturer covered by patient's insurance. Meldon Sport, MD Taking Active   Blood Pressure Monitor DEVI [id number] No 1 each by Does not apply route daily. Del Abron Abt, FNP Taking Active   Blood Pressure Monitoring (BLOOD PRESSURE KIT) DEVI [id number] No Take blood pressure reading once daily Del Amber Bail, [place], FNP Taking Active   ciclopirox  (PENLAC ) 8 % solution [id number] No Apply topically at bedtime. Apply over nail and surrounding skin. Apply daily over previous coat. After seven (7) days, may remove with alcohol and continue cycle. Del Amber Bail, Rogerio Clay, FNP Taking Active   gabapentin  (NEURONTIN ) 300 MG capsule [id number]  TAKE 1 CAPSULE BY MOUTH EVERY 8 HOURS AS NEEDED Del Orbe Polanco, [place], FNP  Active   Glucose Blood (BLOOD GLUCOSE TEST STRIPS) STRP [id number] No 1 each by In Vitro route in the morning, at noon, and at bedtime. May substitute to any manufacturer covered by patient's insurance. Meldon Sport, MD Taking Active   ketoconazole (NIZORAL) 2 % cream [id number] No Apply 1 Application topically 2 (two) times daily. [provider] Taking Active   Lancet Device MISC [id number] No 1 each by Does not apply route in the morning, at noon, and at bedtime. May substitute to any manufacturer covered by patient's insurance. Meldon Sport, MD Taking Active   Lancets Misc. MISC [id number] No 1 each by Does not apply route in the morning, at noon, and at bedtime. May substitute to any manufacturer covered by patient's insurance. Meldon Sport, MD Taking Active   losartan  (COZAAR ) 50 MG tablet [id number] No Take 1 tablet (50 mg total) by mouth daily. Meldon Sport, MD Taking Active   meclizine  (ANTIVERT )  25 MG tablet [id number] No Take 1 tablet (25 mg total) by mouth 2 (two) times daily as needed for dizziness.  Patient not taking: Reported on 02/22/2024   Rosanna Comment, FNP Not Taking Active            Med Note (ROBB, MELANIE A   Mon Oct 23, 2023  4:33 PM) Did not like the way it made him feel  metFORMIN  (GLUCOPHAGE ) 500 MG tablet [id number] No Take 1 tablet by mouth once daily with breakfast Del Amber Bail, [place], FNP Taking Active   methimazole  (TAPAZOLE ) 5 MG tablet [id number]  Take 1 tablet (5 mg total) by mouth 2 (two) times daily with a meal. With breakfast and supper Nida, Gebreselassie W, MD  Active   mirtazapine  (REMERON ) 7.5 MG tablet [id number] No Take 1 tablet (7.5 mg total) by mouth at bedtime.  Patient not taking: Reported on 02/22/2024   Meldon Sport, MD Not Taking Active   omeprazole  (PRILOSEC) 40 MG capsule [id number] No Take 1 capsule (40 mg total) by mouth 2 (two) times daily. Vinetta Greening, DO Taking Expired 12/15/23 2359            Med Note Bennett Brass, TAMMY   Mon Mar 27, 2023  3:32 PM)  pregabalin  (LYRICA ) 50 MG capsule [id number] No Take 1 capsule (50 mg total) by mouth 2 (two) times daily. Del Amber Bail, Rogerio Clay, FNP Taking Active   rosuvastatin  (CRESTOR ) 5 MG tablet [id number] No Take 5 mg by mouth daily. [provider] Taking Active   sildenafil  (REVATIO ) 20 MG tablet [id number] No Take 1 tablet (20 mg total) by mouth daily. McKenzie, Arden Beck, MD Taking Active   sildenafil  (VIAGRA ) 100 MG tablet [id number] No Take 1 tablet (100 mg total) by mouth as needed for erectile dysfunction. McKenzie, Arden Beck, MD Taking Active   tiZANidine  (ZANAFLEX ) 4 MG tablet [id number] No TAKE 1 TABLET BY MOUTH EVERY 12 HOURS AS NEEDED FOR MUSCLE SPASM Del Orbe Polanco, Rogerio Clay, FNP Taking Active   traMADol  (ULTRAM ) 50 MG tablet [id number] No Take 1 tablet (50 mg total) by mouth daily as needed. Del Abron Abt, FNP Taking Active            Med Note (ROBB,  MELANIE A   Mon Oct 23, 2023  4:32 PM) Did not like the way it made him feel            Recommendation:   Continue Current Plan of Care  Follow Up Plan:   Patient has met all care management goals. Care Management case will be closed. Patient has been provided contact information should new needs arise.   Fletcher Humble MSW, LCSW Licensed Clinical Social Worker  [facility], Population Health Direct Dial: [phone]  Fax: [phone]

## 2024-03-21 ENCOUNTER — Other Ambulatory Visit: Payer: Self-pay | Admitting: Licensed Clinical Social Worker

## 2024-03-21 ENCOUNTER — Other Ambulatory Visit: Payer: Self-pay | Admitting: *Deleted

## 2024-03-21 DIAGNOSIS — Z742 Need for assistance at home and no other household member able to render care: Secondary | ICD-10-CM

## 2024-03-21 NOTE — Patient Instructions (Signed)
 Visit Information  Thank you for taking time to visit with me today. Please don't hesitate to contact me if I can be of assistance to you before our next scheduled appointment.  Your next care management appointment is by telephone on 04/18/2024   Telephone follow up appointment date/time:  04/18/2024  Please call the care guide team at [phone] if you need to cancel, schedule, or reschedule an appointment.   Please call the Suicide and Crisis Lifeline: 988 call the [place]  National Suicide Prevention Lifeline: 325-312-4411 or TTY: (620)623-7463 TTY 530 454 7458) to talk to a trained counselor call 1-800-273-TALK (toll free, 24 hour hotline) call the [facility]: ([phone] call 911 if you are experiencing a Mental Health or Behavioral Health Crisis or need someone to talk to.  Alfonso Rummer MSW, LCSW Licensed Clinical Social Worker  [facility], Population Health Direct Dial: [phone]  Fax: [phone]

## 2024-03-21 NOTE — Patient Outreach (Signed)
 Complex Care Management   Visit Note  03/21/2024  Name:  Kirk Oconnor MRN: [id number] DOB: 11-Dec-1955  Situation: Referral received for Complex Care Management related to Diabetes with Complications I obtained verbal consent from Patient.  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   Arthritis    CAD (coronary artery disease)    Nonobstructive at cardiac catheterization July 2018 - [facility]   Carpal tunnel syndrome    Depression    Diabetes mellitus without complication (HCC)    Essential hypertension    GERD (gastroesophageal reflux disease)    Hepatitis C    Sleep apnea     Assessment: Patient Reported Symptoms:  Cognitive Cognitive Status: Able to follow simple commands, Alert and oriented to person, place, and time, Normal speech and language skills Cognitive/Intellectual Conditions Management [RPT]: None reported or documented in medical history or problem list   Health Maintenance Behaviors: Annual physical exam  Neurological Neurological Review of Symptoms: Dizziness, Numbness    HEENT HEENT Symptoms Reported: No symptoms reported      Cardiovascular Cardiovascular Symptoms Reported: Lightheadness, Dizziness Does patient have uncontrolled Hypertension?: No Cardiovascular Conditions: Hypertension  Respiratory Respiratory Symptoms Reported: No symptoms reported Respiratory Conditions: Shortness of breath  Endocrine Patient reports the following symptoms related to hypoglycemia or hyperglycemia : No symptoms reported Is patient diabetic?: Yes Is patient checking blood sugars at home?: No Endocrine Conditions: Diabetes, Thyroid  disorder Endocrine Management  Strategies: Medication therapy  Gastrointestinal Gastrointestinal Symptoms Reported: No symptoms reported   Nutrition Risk Screen (CP): No indicators present  Genitourinary Genitourinary Symptoms Reported: No symptoms reported    Integumentary Integumentary Symptoms Reported: No symptoms reported    Musculoskeletal Musculoskelatal Symptoms Reviewed: No symptoms reported Musculoskeletal Conditions: Unsteady gait Falls in the past year?: No Number of falls in past year: 1 or less Was there an injury with Fall?: No Fall Risk Category Calculator: 0 Patient Fall Risk Level: Low Fall Risk Patient at Risk for Falls Due to: No Fall Risks Fall risk Follow up: Falls evaluation completed  Psychosocial Psychosocial Symptoms Reported: Depression - if selected complete PHQ 2-9 Behavioral Health Conditions: Depression Major Change/Loss/Stressor/Fears (CP): Medical condition, self Quality of Family Relationships: helpful, involved, supportive Do you feel physically threatened by others?: No      03/21/2024    9:19 AM  Depression screen PHQ 2/9  Decreased Interest 3  Down, Depressed, Hopeless 3  PHQ - 2 Score 6  Altered sleeping 3  Tired, decreased energy 3  Change in appetite 3  Feeling bad or failure about yourself  3  Trouble concentrating 0  Moving slowly or fidgety/restless 0  Suicidal thoughts 3  PHQ-9 Score 21  Difficult doing work/chores Very difficult    There were no vitals filed for this visit.  Medications Reviewed Today     Reviewed by Bertrum Rosina HERO, RN (Registered Nurse) on 03/21/24 at [phone]  Med List Status: <None>   Medication Order Taking? Sig Documenting Provider Last Dose Status Informant  acetaminophen  (TYLENOL ) 500 MG tablet [id number] No Take 500 mg by mouth every 6 (six) hours as needed. [provider] Taking Active   AMBULATORY NON DONNELLY MEDICATION [id number]  No Vacuum erection device. Dispense 1. Gerldine Lauraine BROCKS, FNP Taking Active   amLODipine   (NORVASC ) 10 MG tablet [id number]  Take 1 tablet by mouth once daily Del Orbe Polanco, [place], [place]  Active   aspirin  EC 81 MG tablet [id number] No Take 81 mg by mouth daily. [provider] Taking Active Family Member  Blood Glucose Monitoring Suppl DEVI [id number] No 1 each by Does not apply route in the morning, at noon, and at bedtime. May substitute to any manufacturer covered by patient's insurance. Tobie Suzzane POUR, MD Taking Active   Blood Pressure Monitor DEVI [id number] No 1 each by Does not apply route daily. Del Wilhelmena Lloyd Sola, FNP Taking Active   Blood Pressure Monitoring (BLOOD PRESSURE KIT) DEVI [id number] No Take blood pressure reading once daily Del Wilhelmena Lloyd, [place], FNP Taking Active   ciclopirox  (PENLAC ) 8 % solution [id number] No Apply topically at bedtime. Apply over nail and surrounding skin. Apply daily over previous coat. After seven (7) days, may remove with alcohol and continue cycle. Del Wilhelmena Lloyd, [place], FNP Taking Active   gabapentin  (NEURONTIN ) 300 MG capsule [id number]  TAKE 1 CAPSULE BY MOUTH EVERY 8 HOURS AS NEEDED Del Orbe Polanco, [place], FNP  Active   Glucose Blood (BLOOD GLUCOSE TEST STRIPS) STRP [id number] No 1 each by In Vitro route in the morning, at noon, and at bedtime. May substitute to any manufacturer covered by patient's insurance. Tobie Suzzane POUR, MD Taking Active   ketoconazole (NIZORAL) 2 % cream [id number] No Apply 1 Application topically 2 (two) times daily. [provider] Taking Active   Lancet Device MISC [id number] No 1 each by Does not apply route in the morning, at noon, and at bedtime. May substitute to any manufacturer covered by patient's insurance. Tobie Suzzane POUR, MD Taking Active   Lancets Misc. MISC [id number] No 1 each by Does not apply route in the morning, at noon, and at bedtime. May substitute to any manufacturer covered by patient's insurance. Tobie Suzzane POUR, MD Taking Active   losartan  (COZAAR ) 50 MG tablet  [id number] No Take 1 tablet (50 mg total) by mouth daily. Tobie Suzzane POUR, MD Taking Active   meclizine  (ANTIVERT ) 25 MG tablet [id number] No Take 1 tablet (25 mg total) by mouth 2 (two) times daily as needed for dizziness.  Patient not taking: Reported on 02/22/2024   Terry Wilhelmena Lloyd Sola, FNP Not Taking Active            Med Note (ROBB, MELANIE A   Mon Oct 23, 2023  4:33 PM) Did not like the way it made him feel  metFORMIN  (GLUCOPHAGE ) 500 MG tablet [id number] No Take 1 tablet by mouth once daily with breakfast Del Wilhelmena Lloyd, [place], FNP Taking Active   methimazole  (TAPAZOLE ) 5 MG tablet [id number]  Take 1 tablet (5 mg total) by mouth 2 (two) times daily with a meal. With breakfast and supper Nida, Gebreselassie W, MD  Active   mirtazapine  (REMERON ) 7.5 MG tablet [id number] No Take 1 tablet (7.5 mg total) by mouth at bedtime.  Patient not taking: Reported on 02/22/2024   Tobie Suzzane POUR, MD Not Taking Active   omeprazole  (PRILOSEC) 40 MG capsule [id number] No Take 1 capsule (40 mg total) by mouth 2 (two) times daily. Cindie Carlin POUR, DO Taking Expired 12/15/23 2359            Med Note MAZIE, TAMMY   Mon Mar 27, 2023  3:32 PM)    pregabalin  (LYRICA ) 50  MG capsule [id number] No Take 1 capsule (50 mg total) by mouth 2 (two) times daily. Del Wilhelmena Falter, [place]ilario, FNP Taking Active   rosuvastatin  (CRESTOR ) 5 MG tablet [id number] No Take 5 mg by mouth daily. [provider] Taking Active   sildenafil  (REVATIO ) 20 MG tablet [id number] No Take 1 tablet (20 mg total) by mouth daily. McKenzie, Belvie CROME, MD Taking Active   sildenafil  (VIAGRA ) 100 MG tablet [id number] No Take 1 tablet (100 mg total) by mouth as needed for erectile dysfunction. McKenzie, Belvie CROME, MD Taking Active   tiZANidine  (ZANAFLEX ) 4 MG tablet [id number] No TAKE 1 TABLET BY MOUTH EVERY 12 HOURS AS NEEDED FOR MUSCLE SPASM Del Wilhelmena Falter, [place]ilario, FNP Taking Active   traMADol  (ULTRAM ) 50 MG tablet [id number] No Take 1 tablet (50  mg total) by mouth daily as needed. Del Wilhelmena Falter Hilario, FNP Taking Active            Med Note (ROBB, MELANIE A   Mon Oct 23, 2023  4:32 PM) Did not like the way it made him feel            Recommendation:   Referral to: Palliative Care per patient request Continue Current Plan of Care  Follow Up Plan:   Telephone follow-up in 1 month  Rosina Forte, BSN RN [facility], [facility] RN Care Manager Direct Dial: [phone]  Fax: [phone]

## 2024-03-21 NOTE — Patient Instructions (Signed)
 Visit Information  Thank you for taking time to visit with me today. Please don't hesitate to contact me if I can be of assistance to you before our next scheduled appointment.  Your next care management appointment is by telephone on 04-18-2024 at 9:00 am  Telephone follow-up in 1 month  Please call the care guide team at [phone] if you need to cancel, schedule, or reschedule an appointment.   Please call the Suicide and Crisis Lifeline: 988 call the [place]  National Suicide Prevention Lifeline: 5404564841 or TTY: 434-828-1330 TTY (352)104-7670) to talk to a trained counselor call 1-800-273-TALK (toll free, 24 hour hotline) call the [facility]: ([phone]) [phone] call 911 if you are experiencing a Mental Health or Behavioral Health Crisis or need someone to talk to.  Rosina Forte, BSN RN [facility], [facility] Health RN Care Manager Direct Dial: [phone]  Fax: [phone]

## 2024-03-21 NOTE — Patient Outreach (Signed)
 Complex Care Management   Visit Note  03/21/2024  Name:  Kirk Oconnor MRN: [id number] DOB: 07/24/1956  Situation: Referral received for Complex Care Management related to Mental/Behavioral Health diagnosis depressed mood  I obtained verbal consent from Patient.  Visit completed with Kirk Oconnor  on the phone. Received request to contact pt regarding reported thoughts of self harm. LCSW A Ellwyn Ergle contact pt via phone. Kirk Oconnor reports he is currently working out at [employer] and denies thoughts of self harm. Pt report being unhappy with current endocrinologist. LCSW A Laurissa Cowper has previously offered assistance to locate another endocrinologist however pt denies. LCSW A Samiyah Stupka contact Dr. Lenis office spk with Kirk Oconnor and requested someone contact pt to schedule a follow up appt.  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   Arthritis    CAD (coronary artery disease)    Nonobstructive at cardiac catheterization July 2018 - [facility]   Carpal tunnel syndrome    Depression    Diabetes mellitus without complication (HCC)    Essential hypertension    GERD (gastroesophageal reflux disease)    Hepatitis C    Sleep apnea     Assessment: Patient Reported Symptoms:  Cognitive        Neurological      HEENT        Cardiovascular      Respiratory      Endocrine      Gastrointestinal        Genitourinary      Integumentary      Musculoskeletal          Psychosocial              03/21/2024    9:19 AM  Depression screen PHQ 2/9  Decreased Interest 3  Down, Depressed, Hopeless 3  PHQ - 2 Score 6  Altered sleeping 3  Tired, decreased energy 3  Change in appetite 3  Feeling bad or failure about yourself   3  Trouble concentrating 0  Moving slowly or fidgety/restless 0  Suicidal thoughts 3  PHQ-9 Score 21  Difficult doing work/chores Very difficult    There were no vitals filed for this visit.  Medications Reviewed Today   Medications were not reviewed in this encounter     Recommendation:   Continue Current Plan of Care  Follow Up Plan:   Telephone follow up appointment date/time:  04/18/2024  Alfonso Rummer MSW, LCSW Licensed Clinical Social Worker  [facility], Population Health Direct Dial: [phone]  Fax: [phone]

## 2024-03-22 ENCOUNTER — Telehealth: Payer: Self-pay

## 2024-03-22 ENCOUNTER — Other Ambulatory Visit: Payer: Self-pay

## 2024-03-22 NOTE — Progress Notes (Signed)
 03/22/2024 Name: Kirk Oconnor MRN: [id number] DOB: [birth year]  No chief complaint on file.   Kirk Oconnor is a 68 y.o. year old male who presented for a telephone visit.   They were referred to the pharmacist by their PCP for assistance in managing medication access and complex medication management.    Subjective:  Care Team: Primary Care Provider: Terry Wilhelmena Lloyd Hilario, FNP ; Next Scheduled Visit: Future Appointments  Date Time Provider Department Center  04/05/2024 12:20 PM McKenzie, Belvie CROME, MD AUR-AUR None  04/18/2024  9:00 AM Bertrum Rosina HERO, RN CHL-POPH None  05/16/2024 11:20 AM Alvan Dorn FALCON, MD CVD[facility]EDEN [facility]  05/22/2024 10:20 AM Del Wilhelmena Lloyd Hilario, FNP RPC-RPC [facility]  07/04/2024 11:00 AM Lenis Ethelle ORN, MD REA-REA None     Medication Access/Adherence  Current Pharmacy:  [facility] [address], [place] - [address] [place] [address] Phone: [phone] Fax: [phone]  [facility] - [place], [place] - [address] [place] [address] Phone: [phone] Fax: [phone]   Patient reports affordability concerns with their medications: No  Patient reports access/transportation concerns to their pharmacy:  Patient reports adherence concerns with their medications:  Yes  - will work on getting packaging   Medication Management:  Current adherence strategy: local retail pharmacy   Patient reports Fair adherence to medications  Has expressed interest in obtaining medications in packaging.   Objective:  Lab Results  Component Value Date   HGBA1C 6.5 02/28/2024    Lab Results  Component Value Date   CREATININE 1.14 01/17/2024   BUN 17 01/17/2024   NA 138 01/17/2024   K 4.5 01/17/2024   CL 101 01/17/2024   CO2 22 01/17/2024    Lab Results  Component Value Date   CHOL 120 02/15/2024   HDL 33 (L) 02/15/2024   LDLCALC 74 02/15/2024   TRIG 65  02/15/2024   CHOLHDL 3.6 02/15/2024    Medications Reviewed Today     Reviewed by Pandora Cadet, [facility] (Pharmacist) on 03/22/24 at 1514  Med List Status: <None>   Medication Order Taking? Sig Documenting Provider Last Dose Status Informant  acetaminophen  (TYLENOL ) 500 MG tablet [id number] Yes Take 500 mg by mouth every 6 (six) hours as needed. [provider]  Active   AMBULATORY NON FORMULARY MEDICATION [id number]  Vacuum erection device. Dispense 1. Gerldine Lauraine BROCKS, FNP  Active   amLODipine  (NORVASC ) 10 MG tablet [id number] Yes Take 1 tablet by mouth once daily Del Orbe Polanco, [place], FNP  Active   aspirin  EC 81 MG tablet [id number] Yes Take 81 mg by mouth daily. [provider]  Active Family Member  Blood Glucose Monitoring Suppl DEVI [id number] Yes 1 each by Does not apply route in the morning, at noon, and at bedtime. May substitute to any manufacturer covered by patient's insurance. Tobie Suzzane POUR, MD  Active   Blood Pressure Monitor DEVI [id number]  1 each by Does not apply route daily. Del Wilhelmena Lloyd Hilario, FNP  Active   Blood Pressure Monitoring (BLOOD PRESSURE KIT) DEVI [id number]  Take blood pressure reading once daily Del Orbe Polanco, [place], FNP  Active   ciclopirox  (PENLAC ) 8 % solution [id number] Yes Apply topically at bedtime. Apply over nail and surrounding skin. Apply daily over previous coat. After seven (7) days, may remove with alcohol and continue cycle. Del Wilhelmena Lloyd Hilario, FNP  Active     Discontinued 03/22/24 1456 (  Dose change)            Med Note>> Pandora Cadet, [employer]   03/22/2024  2:56 PM Dr Roddie DPM increased to 400mg  TID    gabapentin  (NEURONTIN ) 400 MG capsule [id number] Yes Take 400 mg by mouth 3 (three) times daily. [provider]  Active   Glucose Blood (BLOOD GLUCOSE TEST STRIPS) STRP [id number] Yes 1 each by In Vitro route in the morning, at noon, and at bedtime. May substitute to any manufacturer covered by patient's insurance.  Patel, Rutwik K, MD  Active   ketoconazole (NIZORAL) 2 % cream [id number] Yes Apply 1 Application topically 2 (two) times daily. [provider]  Active   Lancet Device MISC [id number] Yes 1 each by Does not apply route in the morning, at noon, and at bedtime. May substitute to any manufacturer covered by patient's insurance. Tobie Suzzane POUR, MD  Active     Discontinued 03/22/24 1513 (Duplicate)   losartan  (COZAAR ) 50 MG tablet [id number] Yes Take 1 tablet (50 mg total) by mouth daily. Tobie Suzzane POUR, MD  Active            Med Note (Glenette Bookwalter W   Fri Mar 22, 2024  2:59 PM) Takes in the AM    Patient not taking:   Discontinued 03/22/24 1500 (Patient Preference)            Med Note (ROBB, MELANIE A   Mon Oct 23, 2023  4:33 PM) Did not like the way it made him feel  metFORMIN  (GLUCOPHAGE ) 500 MG tablet [id number] Yes Take 1 tablet by mouth once daily with breakfast Del Wilhelmena Falter, [place], FNP  Active   methimazole  (TAPAZOLE ) 5 MG tablet [id number] Yes Take 1 tablet (5 mg total) by mouth 2 (two) times daily with a meal. With breakfast and supper Nida, Gebreselassie W, MD  Active   mirtazapine  (REMERON ) 7.5 MG tablet [id number] Yes Take 1 tablet (7.5 mg total) by mouth at bedtime.  Patient taking differently: Take 1 tablet (7.5 mg total) by mouth at bedtime.   Tobie Suzzane POUR, MD  Active   omeprazole  [facility]) 40 MG capsule [id number]  Take 1 capsule (40 mg total) by mouth 2 (two) times daily.  Patient not taking: Reported on 03/22/2024   Cindie Carlin POUR, DO  Expired 12/15/23 2359            Med Note MAZIE, TAMMY   Mon Mar 27, 2023  3:32 PM)    pregabalin  (LYRICA ) 50 MG capsule [id number] Yes Take 1 capsule (50 mg total) by mouth 2 (two) times daily.  Patient taking differently: Take 1 capsule (50 mg total) by mouth 2 (two) times daily.   Del Orbe Polanco, Hilario, FNP  Active   rosuvastatin  (CRESTOR ) 5 MG tablet [id number] Yes Take 5 mg by mouth daily. [provider]  Active             Med Note (Nyzaiah Kai, CADET ORN   Fri Mar 22, 2024  3:01 PM) Takes in AM  sildenafil  (REVATIO ) 20 MG tablet [id number] Yes Take 1 tablet (20 mg total) by mouth daily.  Patient taking differently: Take 20 mg by mouth daily as needed.   McKenzie, Belvie CROME, MD  Active   sildenafil  (VIAGRA ) 100 MG tablet [id number] Yes Take 1 tablet (100 mg total) by mouth as needed for erectile dysfunction. McKenzie, Belvie CROME, MD  Active   tiZANidine  (ZANAFLEX ) 4 MG tablet [id number] Yes TAKE 1 TABLET BY MOUTH EVERY  12 HOURS AS NEEDED FOR MUSCLE SPASM  Patient taking differently: Take 4 mg by mouth 2 (two) times daily as needed for muscle spasms.   Del Wilhelmena Falter, Hilario, FNP  Active   traMADol  (ULTRAM ) 50 MG tablet [id number]  Take 1 tablet (50 mg total) by mouth daily as needed.  Patient not taking: Reported on 03/22/2024   Terry Wilhelmena Falter Hilario, FNP  Consider Medication Status and Discontinue (No longer needed (for PRN medications))            Med Note (ROBB, MELANIE A   Mon Oct 23, 2023  4:32 PM) Did not like the way it made him feel              Assessment/Plan:   Medication Management: - Currently strategy insufficient to maintain appropriate adherence to prescribed medication regimen - Created list of medication, indication, and administration time. Provided to patient - Discussed collaboration with local pharmacies for adherence packaging. Reviewed local pharmacies with adherence packaging options. Patient elects to transition to [facility] OP pharmacy and use pill packaging.    Follow Up Plan: collaborate with dispensing pharmacy to get patient set up with packaging. Will reach out and coordinate. Patient provided with pharmacy's number, all meds up to date - message sent to [facility] 03/22/2024 321pm  Lang Sieve, PharmD, BCGP Clinical Pharmacist  [phone]

## 2024-03-22 NOTE — Progress Notes (Signed)
 Care Guide Pharmacy Note  03/22/2024 Name: Kirk Oconnor MRN: [id number] DOB: 08-Apr-1956  Referred By: Terry Wilhelmena Lloyd Hilario, FNP Reason for referral: Complex Care Management (Outreach to schedule with Pharm d )   VARIAN INNES is a 68 y.o. year old male who is a primary care patient of Del Wilhelmena Lloyd Hilario, FNP.  Garen MALVA Blare was referred to the pharmacist for assistance related to: COPD and DMII  Successful contact was made with the patient to discuss pharmacy services including being ready for the pharmacist to call at least 5 minutes before the scheduled appointment time and to have medication bottles and any blood pressure readings ready for review. The patient agreed to meet with the pharmacist via telephone visit on (date/time).03/22/2024  Jeoffrey Buffalo , RMA     [place]  [facility], [facility] Guide  Direct Dial: [phone]  Website: [email or web address]

## 2024-03-26 ENCOUNTER — Other Ambulatory Visit: Payer: Self-pay | Admitting: Nurse Practitioner

## 2024-03-26 DIAGNOSIS — I10 Essential (primary) hypertension: Secondary | ICD-10-CM

## 2024-03-27 ENCOUNTER — Other Ambulatory Visit: Payer: Self-pay

## 2024-04-01 ENCOUNTER — Other Ambulatory Visit: Payer: Self-pay

## 2024-04-05 ENCOUNTER — Encounter: Payer: Self-pay | Admitting: Urology

## 2024-04-05 ENCOUNTER — Ambulatory Visit (INDEPENDENT_AMBULATORY_CARE_PROVIDER_SITE_OTHER): Admitting: Urology

## 2024-04-05 VITALS — BP 172/96 | HR 79

## 2024-04-05 DIAGNOSIS — N529 Male erectile dysfunction, unspecified: Secondary | ICD-10-CM | POA: Diagnosis not present

## 2024-04-05 MED ORDER — SILDENAFIL CITRATE 20 MG PO TABS: 20.0000 mg | ORAL_TABLET | Freq: Every day | ORAL | 11 refills | Status: AC

## 2024-04-05 MED ORDER — SILDENAFIL CITRATE 100 MG PO TABS: 100.0000 mg | ORAL_TABLET | ORAL | 5 refills | Status: AC | PRN

## 2024-04-05 NOTE — Progress Notes (Signed)
 04/05/2024 12:33 PM   Kirk Oconnor Blare 01-Feb-1956 [id number]  Referring provider: Terry Wilhelmena Lloyd Hilario, FNP [phone] [place],  [place] [address]  Erectile dysfunction   HPI: Kirk Oconnor is a 68yo here for followup for erectile dysfunction. He did not have an appointment with Dr. Lovie for discussion of IPP placement. He takes sildenafil  100mg  prn with mixed results. 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   Arthritis    CAD (coronary artery disease)    Nonobstructive at cardiac catheterization July 2018 - [facility]   Carpal tunnel syndrome    Depression    Diabetes mellitus without complication (HCC)    Essential hypertension    GERD (gastroesophageal reflux disease)    Hepatitis C    Sleep apnea     Surgical History: Past Surgical History:  Procedure Laterality Date   AORTIC VALVE REPLACEMENT  03/2017   [facility]   BIOPSY  12/15/2022   Procedure: BIOPSY;  Surgeon: Cindie Carlin POUR, DO;  [facility];  Service: Endoscopy;;   COLONOSCOPY WITH PROPOFOL  N/A 12/15/2022   Procedure: COLONOSCOPY WITH PROPOFOL ;  Surgeon: Cindie Carlin POUR, DO;  [facility];  Service: Endoscopy;  Laterality: N/A;  10:00 am, asa 3   ESOPHAGOGASTRODUODENOSCOPY (EGD) WITH PROPOFOL  N/A 12/15/2022   Procedure: ESOPHAGOGASTRODUODENOSCOPY (EGD) WITH PROPOFOL ;  Surgeon: Cindie Carlin POUR, DO;  [facility];  Service: Endoscopy;  Laterality: N/A;   POLYPECTOMY  12/15/2022   Procedure: POLYPECTOMY;  Surgeon: Cindie Carlin POUR, DO;  [facility];  Service: Endoscopy;;    Home Medications:  Allergies as of 04/05/2024   No Known Allergies       Medication List        Accurate as of April 05, 2024 12:33 PM. If you have any questions, ask your nurse or doctor.          acetaminophen  500 MG tablet Commonly known as: TYLENOL  Take 500 mg by mouth every 6 (six) hours as needed.   AMBULATORY NON FORMULARY MEDICATION Vacuum erection device. Dispense 1.   amLODipine  10 MG tablet Commonly known as: NORVASC  Take 1 tablet by mouth once daily   aspirin  EC 81 MG tablet Take 81 mg by mouth daily.   Blood Glucose Monitoring Suppl Devi 1 each by Does not apply route in the morning, at noon, and at bedtime. May substitute to any manufacturer covered by patient's insurance.   BLOOD GLUCOSE TEST STRIPS Strp 1 each by In Vitro route in the morning, at noon, and at bedtime. May substitute to any manufacturer covered by patient's insurance.   Blood Pressure Kit Devi Take blood pressure reading once daily   Blood Pressure Monitor Devi 1 each by Does not apply route daily.   ciclopirox  8 % solution Commonly known as: PENLAC  Apply topically at bedtime. Apply over nail and surrounding skin. Apply daily over previous coat. After seven (7) days, may remove with alcohol and continue cycle.   gabapentin  400 MG capsule Commonly known as: NEURONTIN  Take 400 mg by mouth 3 (three) times daily.   ketoconazole 2 % cream Commonly known as: NIZORAL Apply 1  Application topically 2 (two) times daily.   Lancet Device Misc 1 each by Does not apply route in the morning, at noon, and at bedtime. May substitute to any manufacturer covered by patient's insurance.   losartan  50 MG tablet Commonly known as: COZAAR  Take 1 tablet (50 mg total) by mouth daily.   metFORMIN  500 MG tablet Commonly known as: GLUCOPHAGE  Take 1 tablet by mouth once daily with breakfast   methimazole  5 MG tablet Commonly known as: TAPAZOLE  Take 1 tablet (5 mg total) by mouth 2 (two) times daily with a meal. With breakfast and supper   mirtazapine  7.5 MG  tablet Commonly known as: REMERON  Take 1 tablet (7.5 mg total) by mouth at bedtime.   omeprazole  40 MG capsule Commonly known as: PRILOSEC Take 1 capsule (40 mg total) by mouth 2 (two) times daily.   pregabalin  50 MG capsule Commonly known as: Lyrica  Take 1 capsule (50 mg total) by mouth 2 (two) times daily.   rosuvastatin  5 MG tablet Commonly known as: CRESTOR  Take 5 mg by mouth daily.   sildenafil  100 MG tablet Commonly known as: VIAGRA  Take 1 tablet (100 mg total) by mouth as needed for erectile dysfunction.   sildenafil  20 MG tablet Commonly known as: REVATIO  Take 1 tablet (20 mg total) by mouth daily. What changed:  when to take this reasons to take this   tiZANidine  4 MG tablet Commonly known as: ZANAFLEX  TAKE 1 TABLET BY MOUTH EVERY 12 HOURS AS NEEDED FOR MUSCLE SPASM What changed: See the new instructions.   traMADol  50 MG tablet Commonly known as: ULTRAM  Take 1 tablet (50 mg total) by mouth daily as needed.        Allergies: No Known Allergies  Family History: Family History  Problem Relation Age of Onset   Diabetes Mother    Heart attack Mother    Heart attack Father     Social History:  reports that he has quit smoking. His smoking use included cigarettes. He has never used smokeless tobacco. He reports current alcohol use. He reports that he does not use drugs.  ROS: All other review of systems were reviewed and are negative except what is noted above in HPI  Physical Exam: BP (!) 172/96   Pulse 79   Constitutional:  Alert and oriented, No acute distress. HEENT: [place] AT, moist mucus membranes.  Trachea midline, no masses. Cardiovascular: No clubbing, cyanosis, or edema. Respiratory: Normal respiratory effort, no increased work of breathing. GI: Abdomen is soft, nontender, nondistended, no abdominal masses GU: No CVA tenderness.  Lymph: No cervical or inguinal lymphadenopathy. Skin: No rashes, bruises or suspicious lesions. Neurologic:  Grossly intact, no focal deficits, moving all 4 extremities. Psychiatric: Normal mood and affect.  Laboratory Data: Lab Results  Component Value Date   WBC 7.0 01/17/2024   HGB 14.0 01/17/2024   HCT 40.7 01/17/2024   MCV 88 01/17/2024   PLT 165 01/17/2024    Lab Results  Component Value Date   CREATININE 1.14 01/17/2024    Lab Results  Component Value Date   PSA 1.68 03/11/2013    Lab Results  Component Value Date   TESTOSTERONE  706 06/19/2023    Lab Results  Component Value Date   HGBA1C 6.5 02/28/2024    Urinalysis    Component Value Date/Time   COLORURINE YELLOW 01/13/2023 1244   APPEARANCEUR Clear 01/17/2024 1142   LABSPEC 1.021 01/13/2023 1244   PHURINE 6.0 01/13/2023 1244   GLUCOSEU Negative 01/17/2024 1142  HGBUR NEGATIVE 01/13/2023 1244   BILIRUBINUR Negative 01/17/2024 1142   KETONESUR NEGATIVE 01/13/2023 1244   PROTEINUR 1+ (A) 01/17/2024 1142   PROTEINUR NEGATIVE 01/13/2023 1244   UROBILINOGEN 1.0 11/30/2011 1910   NITRITE Negative 01/17/2024 1142   NITRITE NEGATIVE 01/13/2023 1244   LEUKOCYTESUR Negative 01/17/2024 1142   LEUKOCYTESUR NEGATIVE 01/13/2023 1244    Lab Results  Component Value Date   LABMICR See below: 01/17/2024   WBCUA 0-5 01/17/2024   LABEPIT 0-10 01/17/2024   BACTERIA None seen 01/17/2024    Pertinent Imaging:  No results found for this or any previous visit.  No results found for this or any previous visit.  No results found for this or any previous visit.  No results found for this or any previous visit.  No results found for this or any previous visit.  No results found for this or any previous visit.  No results found for this or any previous visit.  No results found for this or any previous visit.   Assessment & Plan:    1. Erectile dysfunction, unspecified erectile dysfunction type (Primary) -referral to Dr. Lovie -patient to continue sildenafil  20-100 prn   No follow-ups on file.  Belvie Clara, MD  [facility] [place]

## 2024-04-05 NOTE — Patient Instructions (Signed)

## 2024-04-06 ENCOUNTER — Other Ambulatory Visit: Payer: Self-pay | Admitting: Family Medicine

## 2024-04-08 ENCOUNTER — Telehealth: Payer: Self-pay

## 2024-04-08 NOTE — Telephone Encounter (Signed)
 Medication prior authorization request received.  Completed PA request through cover my meds for drug Sildenafil  . KEY: [id number]  Approved: Pending

## 2024-04-11 ENCOUNTER — Other Ambulatory Visit: Payer: Self-pay | Admitting: Family Medicine

## 2024-04-18 ENCOUNTER — Other Ambulatory Visit: Payer: Self-pay | Admitting: *Deleted

## 2024-04-18 NOTE — Patient Instructions (Signed)
 Visit Information  Thank you for taking time to visit with me today. Please don't hesitate to contact me if I can be of assistance to you before our next scheduled appointment.  Your next care management appointment is by telephone on 05-16-2024 at 9:00 am  Telephone follow-up in 1 month  Please call the care guide team at [phone] if you need to cancel, schedule, or reschedule an appointment.   Please call the Suicide and Crisis Lifeline: 988 call the [place]  National Suicide Prevention Lifeline: 205 046 8762 or TTY: 506-605-3245 TTY 4088414502) to talk to a trained counselor call 1-800-273-TALK (toll free, 24 hour hotline) call the [facility]: [phone] call 911 if you are experiencing a Mental Health or Behavioral Health Crisis or need someone to talk to.  Rosina Forte, BSN RN [facility], [facility] RN Care Manager Direct Dial: [phone]  Fax: [phone]

## 2024-04-18 NOTE — Patient Outreach (Signed)
 Complex Care Management   Visit Note  04/18/2024  Name:  Kirk Oconnor MRN: [id number] DOB: 1956-05-15  Situation: Referral received for Complex Care Management related to Diabetes with Complications I obtained verbal consent from Patient.  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   Arthritis    CAD (coronary artery disease)    Nonobstructive at cardiac catheterization July 2018 - [facility]   Carpal tunnel syndrome    Depression    Diabetes mellitus without complication (HCC)    Essential hypertension    GERD (gastroesophageal reflux disease)    Hepatitis C    Sleep apnea     Assessment: Patient Reported Symptoms:  Cognitive Cognitive Status: No symptoms reported Cognitive/Intellectual Conditions Management [RPT]: None reported or documented in medical history or problem list   Health Maintenance Behaviors: Annual physical exam Healing Pattern: Average Health Facilitated by: Rest  Neurological Neurological Review of Symptoms: Numbness Neurological Management Strategies: Routine screening Neurological Self-Management Outcome: 3 (uncertain)  HEENT HEENT Symptoms Reported: No symptoms reported HEENT Management Strategies: Routine screening HEENT Self-Management Outcome: 4 (good)    Cardiovascular Cardiovascular Symptoms Reported: Dizziness Does patient have uncontrolled Hypertension?: Yes Is patient checking Blood Pressure at home?: No Cardiovascular Management Strategies: Routine screening Cardiovascular Self-Management Outcome: 3 (uncertain)  Respiratory Respiratory Symptoms Reported: No symptoms reported Respiratory Management Strategies: Coping strategies   Endocrine Endocrine Symptoms Reported: No symptoms reported Is patient diabetic?: Yes Is patient checking blood sugars at home?: Yes List most recent blood sugar readings, include date and time of day: 04-18-2024  115 Endocrine Self-Management Outcome: 4 (good)  Gastrointestinal Gastrointestinal Symptoms Reported: Unintentional weight loss Gastrointestinal Management Strategies: Coping strategies Gastrointestinal Self-Management Outcome: 3 (uncertain) Nutrition Risk Screen (CP): No indicators present  Genitourinary Genitourinary Symptoms Reported: Frequency, Malodorous urine Genitourinary Self-Management Outcome: 3 (uncertain)  Integumentary Integumentary Symptoms Reported: No symptoms reported Skin Management Strategies: Routine screening Skin Self-Management Outcome: 4 (good)  Musculoskeletal Musculoskelatal Symptoms Reviewed: No symptoms reported Musculoskeletal Management Strategies: Routine screening Musculoskeletal Self-Management Outcome: 4 (good) Falls in the past year?: No Number of falls in past year: 1 or less Was there an injury with Fall?: No Fall Risk Category Calculator: 0 Patient Fall Risk Level: Low Fall Risk Patient at Risk for Falls Due to: No Fall Risks Fall risk Follow up: Falls evaluation completed  Psychosocial Psychosocial Symptoms Reported: No symptoms reported Behavioral Health Self-Management Outcome: 3 (uncertain) Major Change/Loss/Stressor/Fears (CP): Medical condition, self Techniques to Cope with Loss/Stress/Change: Spiritual practice(s) Quality of Family Relationships: supportive Do you feel physically threatened by others?: No      03/21/2024    9:19 AM  Depression screen PHQ 2/9  Decreased Interest 3  Down, Depressed, Hopeless 3  PHQ - 2 Score 6  Altered sleeping 3  Tired, decreased energy 3  Change in appetite 3  Feeling bad or failure about yourself  3  Trouble concentrating 0  Moving slowly or fidgety/restless 0  Suicidal thoughts 3   PHQ-9 Score 21  Difficult doing work/chores Very difficult    There were no vitals filed for this visit.  Medications Reviewed Today     Reviewed by Bertrum Rosina HERO, RN (Registered Nurse)  on 04/18/24 at 0919  Med List Status: <None>   Medication Order Taking? Sig Documenting Provider Last Dose Status Informant  acetaminophen  (TYLENOL ) 500 MG tablet [id number] Yes Take 500 mg by mouth every 6 (six) hours as needed. [provider]  Active   AMBULATORY NON FORMULARY MEDICATION [id number] Yes Vacuum erection device. Dispense 1. Gerldine Lauraine BROCKS, FNP  Active   amLODipine  (NORVASC ) 10 MG tablet [id number] Yes Take 1 tablet by mouth once daily Del Orbe Polanco, [place], FNP  Active   aspirin  EC 81 MG tablet [id number] Yes Take 81 mg by mouth daily. [provider]  Active Family Member  Blood Glucose Monitoring Suppl DEVI [id number] Yes 1 each by Does not apply route in the morning, at noon, and at bedtime. May substitute to any manufacturer covered by patient's insurance. Tobie Suzzane POUR, MD  Active   Blood Pressure Monitor DEVI [id number] Yes 1 each by Does not apply route daily. Del Wilhelmena Lloyd Sola, FNP  Active   Blood Pressure Monitoring (BLOOD PRESSURE KIT) DEVI [id number] Yes Take blood pressure reading once daily Del Orbe Polanco, [place], FNP  Active   ciclopirox  (PENLAC ) 8 % solution [id number] Yes Apply topically at bedtime. Apply over nail and surrounding skin. Apply daily over previous coat. After seven (7) days, may remove with alcohol and continue cycle. Del Orbe Polanco, Sola, FNP  Active   gabapentin  (NEURONTIN ) 400 MG capsule [id number] Yes Take 400 mg by mouth 3 (three) times daily. [provider]  Active   Glucose Blood (BLOOD GLUCOSE TEST STRIPS) STRP [id number] Yes 1 each by In Vitro route in the morning, at noon, and at bedtime. May substitute to any manufacturer covered by patient's insurance. Patel, Rutwik K, MD  Active   ketoconazole (NIZORAL) 2 %  cream [id number] Yes Apply 1 Application topically 2 (two) times daily. [provider]  Active   Lancet Device MISC [id number] Yes 1 each by Does not apply route in the morning, at noon, and at bedtime. May substitute to any manufacturer covered by patient's insurance. Tobie Suzzane POUR, MD  Active   losartan  (COZAAR ) 50 MG tablet [id number] Yes Take 1 tablet (50 mg total) by mouth daily. Tobie Suzzane POUR, MD  Active            Med Note (POTTS, JACOB W   Fri Mar 22, 2024  2:59 PM) Takes in the AM   metFORMIN  (GLUCOPHAGE ) 500 MG tablet [id number] Yes Take 1 tablet by mouth once daily with breakfast Del Wilhelmena Lloyd, [place], FNP  Active   methimazole  (TAPAZOLE ) 5 MG tablet [id number] Yes Take 1 tablet (5 mg total) by mouth 2 (two) times daily with a meal. With breakfast and supper Nida, Gebreselassie W, MD  Active   mirtazapine  (REMERON ) 7.5 MG tablet [id number] Yes Take 1 tablet (7.5 mg total) by mouth at bedtime. Tobie Suzzane POUR, MD  Active   omeprazole  [facility]) 40 MG capsule [id number] Yes Take 1 capsule (40 mg total) by mouth 2 (two) times daily. Cindie Carlin POUR, DO  Active            Med Note MAZIE, [facility]   Mon Mar 27, 2023  3:32 PM)    pregabalin  (LYRICA ) 50 MG capsule [id number] Yes Take 1 capsule (50 mg total) by mouth 2 (two) times daily. Del Wilhelmena Lloyd, Sola, FNP  Active   rosuvastatin  (CRESTOR ) 5 MG tablet [id number] Yes Take 1 tablet by mouth once daily Del Orbe Polanco, [place], FNP  Active  sildenafil  (REVATIO ) 20 MG tablet [id number]  Take 1 tablet (20 mg total) by mouth daily.  Patient not taking: Reported on 04/18/2024   Sherrilee Belvie CROME, MD  Active   sildenafil  (REVATIO ) 20 MG tablet [id number] Yes Take 1 tablet (20 mg total) by mouth daily. McKenzie, Belvie CROME, MD  Active   sildenafil  (VIAGRA ) 100 MG tablet [id number]  Take 1 tablet (100 mg total) by mouth as needed for erectile dysfunction.  Patient not taking: Reported on 04/18/2024   Sherrilee Belvie CROME, MD  Active    sildenafil  (VIAGRA ) 100 MG tablet [id number] Yes Take 1 tablet (100 mg total) by mouth as needed for erectile dysfunction. McKenzie, Belvie CROME, MD  Active   tiZANidine  (ZANAFLEX ) 4 MG tablet [id number] Yes TAKE 1 TABLET BY MOUTH EVERY 12 HOURS AS NEEDED FOR MUSCLE SPASM Del Orbe Polanco, [place], FNP  Active   traMADol  (ULTRAM ) 50 MG tablet [id number] Yes Take 1 tablet (50 mg total) by mouth daily as needed. Del Wilhelmena Falter, Hilario, FNP  Active            Med Note (ROBB, MELANIE A   Mon Oct 23, 2023  4:32 PM) Did not like the way it made him feel            Recommendation:   Continue Current Plan of Care Daily monitoring of blood sugar and blood pressure. Preserve energy as much as possible when working out  Follow Up Plan:   Telephone follow-up in 1 month  Rosina Forte, BSN RN [facility], [facility] RN Care Manager Direct Dial: [phone]  Fax: [phone]

## 2024-05-03 ENCOUNTER — Telehealth: Payer: Self-pay

## 2024-05-03 NOTE — Telephone Encounter (Signed)
 Copied from CRM [id number]. Topic: Clinical - Prescription Issue >> May 03, 2024  1:17 PM Emylou G wrote: Reason for CRM: Patient called w/Maribel w/[facility].SABRA Font he needs new CPAP and a Rollier walker

## 2024-05-04 ENCOUNTER — Other Ambulatory Visit: Payer: Self-pay | Admitting: Family Medicine

## 2024-05-06 ENCOUNTER — Telehealth: Payer: Self-pay

## 2024-05-06 NOTE — Telephone Encounter (Signed)
 Copied from CRM [id number]. Topic: Clinical - Order For Equipment >> May 06, 2024 11:11 AM Ivette P wrote: Reason for CRM: Auston called in about order for CPAP and Rolling walker, wanted to now if referral order was received and could confirm if received please give a call back     Auston [facility]  [phone] ext. [phone]

## 2024-05-07 NOTE — Telephone Encounter (Signed)
 Yes that's fine

## 2024-05-08 ENCOUNTER — Other Ambulatory Visit: Payer: Self-pay | Admitting: Family Medicine

## 2024-05-08 DIAGNOSIS — R0683 Snoring: Secondary | ICD-10-CM

## 2024-05-08 DIAGNOSIS — G4733 Obstructive sleep apnea (adult) (pediatric): Secondary | ICD-10-CM

## 2024-05-08 NOTE — Telephone Encounter (Signed)
 Patient states he has not had a CPAP machine in the last 10 years. He will need another sleep study to qualify and know what settings he should be on. He would rather see pulmonary and have this done at the hospital. Will enter referral for pulmonary. Will order requested rollator from [employer] and pt aware

## 2024-05-14 ENCOUNTER — Telehealth: Payer: Self-pay | Admitting: Family Medicine

## 2024-05-14 DIAGNOSIS — M79674 Pain in right toe(s): Secondary | ICD-10-CM | POA: Diagnosis not present

## 2024-05-14 DIAGNOSIS — L03039 Cellulitis of unspecified toe: Secondary | ICD-10-CM | POA: Diagnosis not present

## 2024-05-14 DIAGNOSIS — M79675 Pain in left toe(s): Secondary | ICD-10-CM | POA: Diagnosis not present

## 2024-05-14 DIAGNOSIS — E1142 Type 2 diabetes mellitus with diabetic polyneuropathy: Secondary | ICD-10-CM | POA: Diagnosis not present

## 2024-05-14 DIAGNOSIS — L84 Corns and callosities: Secondary | ICD-10-CM | POA: Diagnosis not present

## 2024-05-14 NOTE — Telephone Encounter (Signed)
 Lvm informing

## 2024-05-14 NOTE — Telephone Encounter (Signed)
 Copied from CRM [id number]. Topic: Clinical - Order For Equipment >> May 06, 2024 11:11 AM Ivette P wrote: Reason for CRM: Kirk Oconnor called in about order for CPAP and Rolling walker, wanted to now if referral order was received and could confirm if received please give a call back     Kirk Oconnor [facility] [place] [facility]  [phone] ext. [phone] >> May 14, 2024  1:42 PM Larissa RAMAN wrote: Kirk Oconnor with [facility] calling to check the status of CPAP and rollator walker referral. Requesting a callback at # [phone] EXT [phone]

## 2024-05-15 NOTE — Telephone Encounter (Signed)
 Pt called back following up on missed call. Advised to disregard.

## 2024-05-15 NOTE — Telephone Encounter (Signed)
 Dont see where we called

## 2024-05-15 NOTE — Telephone Encounter (Signed)
 Copied from CRM #[id number]. Topic: General - Other >> May 15, 2024  9:08 AM Jasmin G wrote: Reason for CRM: Pt called regarding recent missed phone call, I could not find any info to relay, please call him back if needed at [phone].

## 2024-05-16 ENCOUNTER — Telehealth: Payer: Self-pay | Admitting: *Deleted

## 2024-05-16 ENCOUNTER — Encounter: Payer: Self-pay | Admitting: *Deleted

## 2024-05-16 ENCOUNTER — Ambulatory Visit: Admitting: Cardiology

## 2024-05-16 NOTE — Patient Instructions (Signed)
 Kushal MALVA Blare - I am sorry I was unable to reach you today for our scheduled appointment. I work with Del Wilhelmena Lloyd Sola, FNP and am calling to support your healthcare needs. Please contact me at [phone] at your earliest convenience. I look forward to speaking with you soon.   Thank you,  Rosina Forte, BSN RN [facility], [facility] RN Care Manager Direct Dial: [phone]  Fax: [phone]

## 2024-05-18 DIAGNOSIS — M6281 Muscle weakness (generalized): Secondary | ICD-10-CM | POA: Diagnosis not present

## 2024-05-18 DIAGNOSIS — M545 Low back pain, unspecified: Secondary | ICD-10-CM | POA: Diagnosis not present

## 2024-05-18 DIAGNOSIS — R262 Difficulty in walking, not elsewhere classified: Secondary | ICD-10-CM | POA: Diagnosis not present

## 2024-05-22 ENCOUNTER — Ambulatory Visit (INDEPENDENT_AMBULATORY_CARE_PROVIDER_SITE_OTHER): Admitting: Family Medicine

## 2024-05-22 ENCOUNTER — Encounter: Payer: Self-pay | Admitting: Family Medicine

## 2024-05-22 ENCOUNTER — Telehealth: Payer: Self-pay

## 2024-05-22 VITALS — BP 128/87 | HR 83 | Ht 66.0 in | Wt 166.0 lb

## 2024-05-22 DIAGNOSIS — I10 Essential (primary) hypertension: Secondary | ICD-10-CM | POA: Diagnosis not present

## 2024-05-22 DIAGNOSIS — M5440 Lumbago with sciatica, unspecified side: Secondary | ICD-10-CM | POA: Diagnosis not present

## 2024-05-22 DIAGNOSIS — Z7984 Long term (current) use of oral hypoglycemic drugs: Secondary | ICD-10-CM

## 2024-05-22 DIAGNOSIS — M5416 Radiculopathy, lumbar region: Secondary | ICD-10-CM | POA: Diagnosis not present

## 2024-05-22 DIAGNOSIS — E119 Type 2 diabetes mellitus without complications: Secondary | ICD-10-CM | POA: Diagnosis not present

## 2024-05-22 DIAGNOSIS — G8929 Other chronic pain: Secondary | ICD-10-CM

## 2024-05-22 MED ORDER — PREGABALIN 100 MG PO CAPS: 100.0000 mg | ORAL_CAPSULE | Freq: Three times a day (TID) | ORAL | 2 refills | Status: DC

## 2024-05-22 MED ORDER — PREGABALIN 50 MG PO CAPS
50.0000 mg | ORAL_CAPSULE | Freq: Two times a day (BID) | ORAL | 2 refills | Status: DC
Start: 2024-05-22 — End: 2024-05-22

## 2024-05-22 NOTE — Progress Notes (Signed)
 Established Patient Office Visit   Subjective  Patient ID: Kirk Oconnor, male    DOB: 03-29-1956  Age: 68 y.o. MRN: [id number]  Chief Complaint  Patient presents with   Diabetes    Four month follow up   Pain    Follow up    He  has a past medical history of Anxiety, Aortic regurgitation (03/2017), Aortic root aneurysm (03/2017), Arthritis, CAD (coronary artery disease), Carpal tunnel syndrome, Depression, Diabetes mellitus without complication (HCC), Essential hypertension, GERD (gastroesophageal reflux disease), Hepatitis C, and Sleep apnea.  HPI Patient presents to the clinic for chronic follow up. For the details of today's visit, please refer to assessment and plan.   Review of Systems  Constitutional:  Negative for chills and fever.  Cardiovascular:  Negative for chest pain.  Musculoskeletal:  Positive for back pain and myalgias.  Neurological:  Negative for dizziness and headaches.      Objective:     BP 128/87   Pulse 83   Ht 5' 6 (1.676 m)   Wt 166 lb (75.3 kg)   SpO2 95%   BMI 26.79 kg/m  BP Readings from Last 3 Encounters:  05/22/24 128/87  04/05/24 (!) 172/96  02/28/24 100/76      Physical Exam Vitals reviewed.  Constitutional:      General: He is not in acute distress.    Appearance: Normal appearance. He is not ill-appearing, toxic-appearing or diaphoretic.  HENT:     Head: Normocephalic.  Eyes:     General:        Right eye: No discharge.        Left eye: No discharge.     Conjunctiva/sclera: Conjunctivae normal.  Cardiovascular:     Rate and Rhythm: Normal rate.     Pulses: Normal pulses.     Heart sounds: Normal heart sounds.  Pulmonary:     Effort: Pulmonary effort is normal. No respiratory distress.     Breath sounds: Normal breath sounds.  Abdominal:     General: Bowel sounds are normal.     Palpations: Abdomen is soft.     Tenderness: There is no abdominal tenderness. There is no right CVA tenderness, left CVA tenderness or  guarding.  Skin:    General: Skin is warm and dry.  Neurological:     Mental Status: He is alert.     Coordination: Coordination abnormal.     Gait: Gait abnormal.  Psychiatric:        Mood and Affect: Mood normal.        Behavior: Behavior normal.      No results found for any visits on 05/22/24.  The ASCVD Risk score (Arnett DK, et al., 2019) failed to calculate for the following reasons:   Risk score cannot be calculated because patient has a medical history suggesting prior/existing ASCVD    Assessment & Plan:  Primary hypertension Assessment & Plan: Vitals:   05/22/24 1018  BP: 128/87    Controlled, Controlled on Amlodipine  10 mg and Losartan  50 mg  Labs ordered.  Continued discussion on DASH diet, low sodium diet and maintain a exercise routine for 150 minutes per week.   Orders: -     Basic metabolic panel with GFR -     CBC with Differential/Platelet -     Lipid panel  Type 2 diabetes mellitus without complication, without long-term current use of insulin (HCC) Assessment & Plan: Last Hemoglobin A1c: 6.6 Labs: Ordered today, results pending; will follow up  accordingly. The patient reports adhering to prescribed medications: Metformin  500 mg once daily  Reviewed non-pharmacological interventions, including a balanced diet rich in lean proteins, healthy fats, whole grains, and high-fiber vegetables. Emphasized reducing refined sugars and processed carbohydrates, and incorporating more fruits, leafy greens, and legumes. Education: Patient was educated on recognizing signs and symptoms of both hypoglycemia and hyperglycemia, and advised to seek emergency care if these symptoms occur. Follow-Up: Scheduled for follow-up in 3-4 months, or sooner if needed. Patient Understanding: The patient verbalized understanding of the care plan, and all questions were answered. Additional Care: Ophthalmology referral was placed. Foot exam results were within normal limits.    Orders: -     Hemoglobin A1c -     Ambulatory referral to Ophthalmology  Lumbar back pain with radiculopathy affecting lower extremity  Chronic midline low back pain with sciatica, sciatica laterality unspecified Assessment & Plan: Mri results showed: Moderate posterior canal and severe right greater than left foraminal narrowing with possible encroachment on exiting nerve root on the right. L2-3, L3-4 and L5-S1 also show mild posterior canal and severe bilateral right greater than left foraminal narrowing.   Start Lyrica  100 mg three times daily for pain management  Patient completed physical therapy sessions Being followed by Neurosurgery, patient refusing surgery   Other orders -     Pregabalin ; Take 1 capsule (100 mg total) by mouth 3 (three) times daily.  Dispense: 90 capsule; Refill: 2    Return in about 4 months (around 09/21/2024), or if symptoms worsen or fail to improve, for chronic follow-up.   Hilario Kidd Wilhelmena Falter, FNP

## 2024-05-22 NOTE — Telephone Encounter (Signed)
 Copied from CRM [id number]. Topic: Clinical - Prescription Issue >> May 22, 2024 10:56 AM DeAngela L wrote: Reason for CRM: [facility] calling to ask if the patient is supposed to have both prescriptions or is there just 1 they received both, and would like to clarify the instructions before filling either   pregabalin  (LYRICA ) 50 MG capsule pregabalin  (LYRICA ) 100 MG capsule  [facility] [address] [place] - [address] [place] [address] Phone: [phone] Fax: [phone]

## 2024-05-22 NOTE — Assessment & Plan Note (Signed)
 Vitals:   05/22/24 1018  BP: 128/87    Controlled, Controlled on Amlodipine  10 mg and Losartan  50 mg  Labs ordered.  Continued discussion on DASH diet, low sodium diet and maintain a exercise routine for 150 minutes per week.

## 2024-05-22 NOTE — Patient Instructions (Addendum)
        Great to see you today.  I have refilled the medication(s) we provide.    Medication: Pregabalin  (Lyrica ) 100 mg capsule Directions: Take 1 capsule (100 mg) by mouth three times daily for back and nerve pain. Start Date: Wednesday, 05/22/2024 Instructions: Take exactly as prescribed. Do not stop abruptly without consulting your provider    If labs were collected, we will inform you of lab results once received either by echart message or telephone call.   - echart message- for normal results that have been seen by the patient already.   - telephone call: abnormal results or if patient has not viewed results in their echart.   - Please take medications as prescribed. - Follow up with your primary health provider if any health concerns arises. - If symptoms worsen please contact your primary care provider and/or visit the emergency department.

## 2024-05-22 NOTE — Assessment & Plan Note (Signed)
 Mri results showed: Moderate posterior canal and severe right greater than left foraminal narrowing with possible encroachment on exiting nerve root on the right. L2-3, L3-4 and L5-S1 also show mild posterior canal and severe bilateral right greater than left foraminal narrowing.   Start Lyrica  100 mg three times daily for pain management  Patient completed physical therapy sessions Being followed by Neurosurgery, patient refusing surgery

## 2024-05-22 NOTE — Assessment & Plan Note (Signed)
 Last Hemoglobin A1c: 6.6 Labs: Ordered today, results pending; will follow up accordingly. The patient reports adhering to prescribed medications: Metformin  500 mg once daily  Reviewed non-pharmacological interventions, including a balanced diet rich in lean proteins, healthy fats, whole grains, and high-fiber vegetables. Emphasized reducing refined sugars and processed carbohydrates, and incorporating more fruits, leafy greens, and legumes. Education: Patient was educated on recognizing signs and symptoms of both hypoglycemia and hyperglycemia, and advised to seek emergency care if these symptoms occur. Follow-Up: Scheduled for follow-up in 3-4 months, or sooner if needed. Patient Understanding: The patient verbalized understanding of the care plan, and all questions were answered. Additional Care: Ophthalmology referral was placed. Foot exam results were within normal limits.

## 2024-05-23 ENCOUNTER — Telehealth: Payer: Self-pay | Admitting: Family Medicine

## 2024-05-23 ENCOUNTER — Ambulatory Visit: Payer: Self-pay | Admitting: Family Medicine

## 2024-05-23 ENCOUNTER — Other Ambulatory Visit: Payer: Self-pay | Admitting: Family Medicine

## 2024-05-23 DIAGNOSIS — E878 Other disorders of electrolyte and fluid balance, not elsewhere classified: Secondary | ICD-10-CM

## 2024-05-23 LAB — CBC WITH DIFFERENTIAL/PLATELET
Basophils Absolute: 0 x10E3/uL (ref 0.0–0.2)
Basos: 0 %
EOS (ABSOLUTE): 0.3 x10E3/uL (ref 0.0–0.4)
Eos: 4 %
Hematocrit: 47 % (ref 37.5–51.0)
Hemoglobin: 15.8 g/dL (ref 13.0–17.7)
Immature Grans (Abs): 0 x10E3/uL (ref 0.0–0.1)
Immature Granulocytes: 0 %
Lymphocytes Absolute: 2.8 x10E3/uL (ref 0.7–3.1)
Lymphs: 40 %
MCH: 29.2 pg (ref 26.6–33.0)
MCHC: 33.6 g/dL (ref 31.5–35.7)
MCV: 87 fL (ref 79–97)
Monocytes Absolute: 0.7 x10E3/uL (ref 0.1–0.9)
Monocytes: 10 %
Neutrophils Absolute: 3.2 x10E3/uL (ref 1.4–7.0)
Neutrophils: 46 %
Platelets: 183 x10E3/uL (ref 150–450)
RBC: 5.42 x10E6/uL (ref 4.14–5.80)
RDW: 14.5 % (ref 11.6–15.4)
WBC: 7.1 x10E3/uL (ref 3.4–10.8)

## 2024-05-23 LAB — BASIC METABOLIC PANEL WITH GFR
BUN/Creatinine Ratio: 15 (ref 10–24)
BUN: 16 mg/dL (ref 8–27)
CO2: 21 mmol/L (ref 20–29)
Calcium: 9.7 mg/dL (ref 8.6–10.2)
Chloride: 94 mmol/L — ABNORMAL LOW (ref 96–106)
Creatinine, Ser: 1.07 mg/dL (ref 0.76–1.27)
Glucose: 83 mg/dL (ref 70–99)
Potassium: 5 mmol/L (ref 3.5–5.2)
Sodium: 129 mmol/L — ABNORMAL LOW (ref 134–144)
eGFR: 77 mL/min/1.73 (ref >59)

## 2024-05-23 LAB — LIPID PANEL
Chol/HDL Ratio: 3.7 ratio (ref 0.0–5.0)
Cholesterol, Total: 157 mg/dL (ref 100–199)
HDL: 43 mg/dL (ref >39)
LDL Chol Calc (NIH): 98 mg/dL (ref 0–99)
Triglycerides: 83 mg/dL (ref 0–149)
VLDL Cholesterol Cal: 16 mg/dL (ref 5–40)

## 2024-05-23 LAB — HEMOGLOBIN A1C
Est. average glucose Bld gHb Est-mCnc: 143 mg/dL
Hgb A1c MFr Bld: 6.6 % — ABNORMAL HIGH (ref 4.8–5.6)

## 2024-05-23 NOTE — Telephone Encounter (Signed)
 [name] spoke with pharmacist and confirmed he should be on the lyrica  100mg  and to d/c the 50mg 

## 2024-05-23 NOTE — Progress Notes (Signed)
 Please inform patient,  Sodium levels are low and we will need repeat labs in one week. We will need Recheck labs again in 1 week. Please follow up in 1 week for repeat blood work ONLY. You can walk in no appointment needed clinic hours: Monday-Friday 8-11 or 1-4 pm.   Hemoglobin A1c 6.6 type 2 diabetes controlled Plan:   Continue Metformin  500 mg once daily   Cholesterol levels within desired limits Plan: Continue Crestor  5 mg once daily

## 2024-05-23 NOTE — Telephone Encounter (Unsigned)
 Copied from CRM [id number]. Topic: Clinical - Medical Advice >> May 23, 2024 10:39 AM Tiffini S wrote: Reason for CRM: Nikki with [place] [facility] [phone] extension: [phone] called about a CPAP machine/ Asked to pcp to please contact the DME company and call to update the patient at [phone].

## 2024-05-24 ENCOUNTER — Telehealth: Payer: Self-pay

## 2024-05-24 NOTE — Telephone Encounter (Signed)
 He has been referred to pulmonary for repeat sleep test and they have not been able to reach him for scheduling. Pls have him reach out to them

## 2024-05-24 NOTE — Telephone Encounter (Signed)
 LVM for patient to call and discuss scheduling

## 2024-05-24 NOTE — Telephone Encounter (Signed)
 Please see below.

## 2024-05-24 NOTE — Telephone Encounter (Signed)
 Noted pulmonary has the message and will reach out to the patient.

## 2024-05-24 NOTE — Telephone Encounter (Signed)
Called patient and mailbox is full

## 2024-05-24 NOTE — Telephone Encounter (Signed)
 Copied from CRM [id number]. Topic: General - Other >> May 24, 2024 10:09 AM Debby BROCKS wrote: Reason for CRM: Returning Missed call from Verla Mar at 10:04 am. Patient would like to get called back again when she is available

## 2024-05-29 ENCOUNTER — Telehealth: Payer: Self-pay | Admitting: *Deleted

## 2024-05-29 ENCOUNTER — Encounter: Payer: Self-pay | Admitting: *Deleted

## 2024-05-29 NOTE — Patient Instructions (Signed)
 Kirk Oconnor - I am sorry I was unable to reach you today for our scheduled appointment. I work with Del Wilhelmena Lloyd Sola, FNP and am calling to support your healthcare needs. Please contact me at [phone] at your earliest convenience. I look forward to speaking with you soon.   Thank you,  Rosina Forte, BSN RN [facility], [facility] RN Care Manager Direct Dial: [phone]  Fax: [phone]

## 2024-06-03 ENCOUNTER — Other Ambulatory Visit: Payer: Self-pay | Admitting: Family Medicine

## 2024-06-06 ENCOUNTER — Encounter: Payer: Self-pay | Admitting: Cardiology

## 2024-06-06 ENCOUNTER — Ambulatory Visit: Attending: Cardiology | Admitting: Cardiology

## 2024-06-06 VITALS — BP 116/70 | HR 77 | Ht 66.0 in | Wt 165.6 lb

## 2024-06-06 DIAGNOSIS — I10 Essential (primary) hypertension: Secondary | ICD-10-CM | POA: Diagnosis not present

## 2024-06-06 DIAGNOSIS — Z952 Presence of prosthetic heart valve: Secondary | ICD-10-CM | POA: Diagnosis not present

## 2024-06-06 DIAGNOSIS — I2510 Atherosclerotic heart disease of native coronary artery without angina pectoris: Secondary | ICD-10-CM | POA: Diagnosis not present

## 2024-06-06 DIAGNOSIS — E782 Mixed hyperlipidemia: Secondary | ICD-10-CM

## 2024-06-06 MED ORDER — ROSUVASTATIN CALCIUM 10 MG PO TABS: 10.0000 mg | ORAL_TABLET | Freq: Every day | ORAL | 3 refills | Status: AC

## 2024-06-06 NOTE — Patient Instructions (Signed)
 Medication Instructions:  Your physician has recommended you make the following change in your medication:  Increase Rosuvastatin  to 10 mg once daily Continue taking all other medications as prescribed   Labwork: None  Testing/Procedures: None  Follow-Up: Your physician recommends that you schedule a follow-up appointment in: 1 year. You will receive a reminder call in about 8 months reminding you to schedule your appointment. If you don't receive this call, please contact our office.   Any Other Special Instructions Will Be Listed Below (If Applicable). Thank you for choosing [facility]!     If you need a refill on your cardiac medications before your next appointment, please call your pharmacy.

## 2024-06-06 NOTE — Progress Notes (Signed)
 Clinical Summary Kirk Oconnor is a 68 y.o.male seen today for follow up of the following medical problems.   1.Chest pain 2018 cath [facility]: mild nonobstructive CAD  - no chest pains. No SOB/DOE.     2.Aortic aneurysm/Aortic regurgitation - severe aortic regurgitation with ascending aortic aneurysm status post bioprosthetic AVR and aortic root replacement with reimplantation of coronaries in July at [facility] in 7//2018 03/2022 echo: LVEF 60-65%, no WMAs, indet diastolic, normal RV, normal AVR   - no SOB/DOE, no LE edema   3. HTN - compliant with meds  4. HLD - 04/2024 TC 157 TG 83 HDL 43 LDL 98 - he is on crestor  5mg  daily.    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     AMBULATORY NON FORMULARY MEDICATION Vacuum erection device. Dispense 1. 1 each 0   amLODipine  (NORVASC ) 10 MG tablet Take 1 tablet by mouth once daily 30 tablet 0   aspirin  EC 81 MG tablet Take 81 mg by mouth daily.     Blood Glucose Monitoring Suppl DEVI 1 each by Does not apply route in the morning, at noon, and at bedtime. May substitute to any manufacturer covered by patient's insurance. 1 each 0   Blood Pressure Monitor DEVI 1 each by Does not apply route daily. 1 each 0    Blood Pressure Monitoring (BLOOD PRESSURE KIT) DEVI Take blood pressure reading once daily 1 each 0   ciclopirox  (PENLAC ) 8 % solution Apply topically at bedtime. Apply over nail and surrounding skin. Apply daily over previous coat. After seven (7) days, may remove with alcohol and continue cycle. 6.6 mL 3   Glucose Blood (BLOOD GLUCOSE TEST STRIPS) STRP 1 each by In Vitro route in the morning, at noon, and at bedtime. May substitute to any manufacturer covered by patient's insurance. 100 strip 1   ketoconazole (NIZORAL) 2 % cream Apply 1 Application topically 2 (two) times daily.     Lancet Device MISC 1 each by Does not apply route in the morning, at noon, and at bedtime. May substitute to any manufacturer covered by patient's insurance. 1 each 0   losartan  (COZAAR ) 50 MG tablet Take 1 tablet (50 mg total) by mouth daily. 90 tablet 1   metFORMIN  (GLUCOPHAGE ) 500 MG tablet Take 1 tablet by mouth once daily with breakfast 90 tablet 0   methimazole  (TAPAZOLE ) 5 MG tablet Take 1 tablet (5 mg total) by mouth 2 (two) times daily with a meal. With breakfast and supper 180 tablet 1   mirtazapine  (REMERON ) 7.5 MG tablet Take 1 tablet (7.5 mg total) by mouth at bedtime. 30 tablet 0  omeprazole  (PRILOSEC) 40 MG capsule Take 1 capsule (40 mg total) by mouth 2 (two) times daily. 60 capsule 11   pregabalin  (LYRICA ) 100 MG capsule Take 1 capsule (100 mg total) by mouth 3 (three) times daily. 90 capsule 2   rosuvastatin  (CRESTOR ) 5 MG tablet Take 1 tablet by mouth once daily 90 tablet 0   sildenafil  (REVATIO ) 20 MG tablet Take 1 tablet (20 mg total) by mouth daily. (Patient not taking: Reported on 04/18/2024) 30 tablet 5   sildenafil  (REVATIO ) 20 MG tablet Take 1 tablet (20 mg total) by mouth daily. 30 tablet 11   sildenafil  (VIAGRA ) 100 MG tablet Take 1 tablet (100 mg total) by mouth as needed for erectile dysfunction. (Patient not taking: Reported on 04/18/2024) 30 tablet 5   sildenafil  (VIAGRA ) 100 MG tablet  Take 1 tablet (100 mg total) by mouth as needed for erectile dysfunction. 30 tablet 5   tiZANidine  (ZANAFLEX ) 4 MG tablet TAKE 1 TABLET BY MOUTH EVERY 12 HOURS AS NEEDED FOR MUSCLE SPASM 60 tablet 2   traMADol  (ULTRAM ) 50 MG tablet Take 1 tablet (50 mg total) by mouth daily as needed. 30 tablet 0   No current facility-administered medications for this visit.     Past Surgical History:  Procedure Laterality Date   AORTIC VALVE REPLACEMENT  03/2017   [facility]   BIOPSY  12/15/2022   Procedure: BIOPSY;  Surgeon: Cindie Carlin POUR, DO;  [facility];  Service: Endoscopy;;   COLONOSCOPY WITH PROPOFOL  N/A 12/15/2022   Procedure: COLONOSCOPY WITH PROPOFOL ;  Surgeon: Cindie Carlin POUR, DO;  [facility];  Service: Endoscopy;  Laterality: N/A;  10:00 am, asa 3   ESOPHAGOGASTRODUODENOSCOPY (EGD) WITH PROPOFOL  N/A 12/15/2022   Procedure: ESOPHAGOGASTRODUODENOSCOPY (EGD) WITH PROPOFOL ;  Surgeon: Cindie Carlin POUR, DO;  [facility];  Service: Endoscopy;  Laterality: N/A;   POLYPECTOMY  12/15/2022   Procedure: POLYPECTOMY;  Surgeon: Cindie Carlin POUR, DO;  [facility]: [facility];  Service: Endoscopy;;     No Known Allergies    Family History  Problem Relation Age of Onset   Diabetes Mother    Heart attack Mother    Heart attack Father      Social History Kirk Oconnor reports that he has quit smoking. His smoking use included cigarettes. He has never used smokeless tobacco. Kirk Oconnor reports current alcohol use.     Physical Examination Today's Vitals   06/06/24 1358  BP: 116/70  Pulse: 77  SpO2: 99%  Weight: 165 lb 9.6 oz (75.1 kg)  Height: 5' 6 (1.676 m)   Body mass index is 26.73 kg/m.  Gen: resting comfortably, no acute distress HEENT: no scleral icterus, pupils equal round and reactive, no palptable cervical adenopathy,  CV: RRR, no m/r,g no jvd Resp: Clear to auscultation bilaterally GI: abdomen is soft, non-tender, non-distended,  normal bowel sounds, no hepatosplenomegaly MSK: extremities are warm, no edema.  Skin: warm, no rash Neuro:  no focal deficits Psych: appropriate affect   Diagnostic Studies 03/2022 echo 1. Left ventricular ejection fraction, by estimation, is 60 to 65%. The  left ventricle has normal function. The left ventricle has no regional  wall motion abnormalities. Left ventricular diastolic parameters are  indeterminate. The average left  ventricular global longitudinal strain is -18.6 %. The global longitudinal  strain is normal.   2. Right ventricular systolic function is normal. The right ventricular  size is normal. Tricuspid regurgitation signal is inadequate for assessing  PA  pressure.   3. The mitral valve is normal in structure. No evidence of mitral valve  regurgitation. No evidence of mitral stenosis.   4. The tricuspid valve is abnormal.   5. The aortic valve has been repaired/replaced. Aortic valve  regurgitation is trivial. No aortic stenosis is present.   6. History of 27 mm Medtronic freestyle porcine root with direct  reimplantation of the left and right coronary ostia, replacement of  ascending aorta with a 28 mm Gelweave Dacron graft. Aortic Aortic root  replacement and reimplantation of coronaries in  July 2018 at [facility].   7. The inferior vena cava is normal in size with greater than 50%  respiratory variability, suggesting right atrial pressure of 3 mmHg.     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 History of bioprosthetic AVR and aneurysm repair - 2023 echo showed normal repair and valve function - no symptoms,  -continue to monitor  3. HLD - would aim or LDL of <70, we will increase crestor  to 10mg  daily  4. HTN  -at goal, continue current meds   F/u 1 year     Dorn PHEBE Ross, M.D.

## 2024-06-13 ENCOUNTER — Other Ambulatory Visit: Payer: Self-pay | Admitting: Cardiology

## 2024-06-13 DIAGNOSIS — I10 Essential (primary) hypertension: Secondary | ICD-10-CM

## 2024-06-13 MED ORDER — LOSARTAN POTASSIUM 50 MG PO TABS: 50.0000 mg | ORAL_TABLET | Freq: Every day | ORAL | 3 refills | Status: AC

## 2024-06-17 ENCOUNTER — Telehealth: Payer: Self-pay | Admitting: *Deleted

## 2024-06-18 ENCOUNTER — Ambulatory Visit: Payer: Self-pay

## 2024-06-18 NOTE — Telephone Encounter (Signed)
 Copied from CRM #[id number]. Topic: Clinical - Red Word Triage >> Jun 18, 2024 12:05 PM Wess RAMAN wrote: Red Word that prompted transfer to Nurse Triage: Patient would like to get his medicine switched from traMADol  (ULTRAM ) 50 MG tablet back to Gabapentin  due to the current pain medication not working   Pain in back. Pain level is 8, difficulty sleeping due to pain.  Pharmacy: [facility] [address] [place] - [address] [place] [address] Phone: [phone] Fax: [phone] Hours: Not open 24 hours  Reason for Disposition . Caller wants to use a complementary or alternative medicine  Answer Assessment - Initial Assessment Questions 1. NAME of MEDICINE: What medicine(s) are you calling about?     Gabapentin   2. QUESTION: What is your question? (e.g., double dose of medicine, side effect)     Changing medication to something else.  3. PRESCRIBER: Who prescribed the medicine? Reason: if prescribed by specialist, call should be referred to that group.     Hilario Falter, NP  4. SYMPTOMS: Do you have any symptoms? If Yes, ask: What symptoms are you having?  How bad are the symptoms (e.g., mild, moderate, severe)     Back Pain, Moderate to Severe  [phone] is the contact number for the patient.  Protocols used: Medication Question Call-A-AH

## 2024-06-19 ENCOUNTER — Other Ambulatory Visit: Payer: Self-pay | Admitting: Family Medicine

## 2024-06-19 MED ORDER — GABAPENTIN 600 MG PO TABS: 600.0000 mg | ORAL_TABLET | Freq: Three times a day (TID) | ORAL | 2 refills | Status: DC

## 2024-06-19 NOTE — Telephone Encounter (Signed)
 Patient advised.

## 2024-06-19 NOTE — Telephone Encounter (Signed)
 Sent- please let the patient know not to take Lyrica . I have discontinued this medication, as it cannot be taken with gabapentin 

## 2024-06-20 ENCOUNTER — Other Ambulatory Visit: Payer: Self-pay | Admitting: *Deleted

## 2024-06-20 NOTE — Patient Instructions (Signed)
 Visit Information  Thank you for taking time to visit with me today. Please don't hesitate to contact me if I can be of assistance to you before our next scheduled appointment.  Your next care management appointment is by telephone on 07-19-2024 at 10:30 am  Face to Face follow up in 1 month  Please call the care guide team at [phone] if you need to cancel, schedule, or reschedule an appointment.   Please call the Suicide and Crisis Lifeline: 988 call the [place]  National Suicide Prevention Lifeline: (819)208-1838 or TTY: (813)459-8868 TTY (601)564-3382) to talk to a trained counselor call 1-800-273-TALK (toll free, 24 hour hotline) if you are experiencing a Mental Health or Behavioral Health Crisis or need someone to talk to.  Rosina Forte, BSN RN [facility], [facility] RN Care Manager Direct Dial: [phone]  Fax: [phone]

## 2024-06-20 NOTE — Patient Outreach (Signed)
 Complex Care Management   Visit Note  06/20/2024  Name:  Kirk Oconnor MRN: [id number] DOB: Feb 17, 1956  Situation: Referral received for Complex Care Management related to Diabetes with Complications I obtained verbal consent from Patient.  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   Arthritis    CAD (coronary artery disease)    Nonobstructive at cardiac catheterization July 2018 - [facility]   Carpal tunnel syndrome    Depression    Diabetes mellitus without complication (HCC)    Essential hypertension    GERD (gastroesophageal reflux disease)    Hepatitis C    Sleep apnea     Assessment: Patient Reported Symptoms:  Cognitive Cognitive Status: No symptoms reported Cognitive/Intellectual Conditions Management [RPT]: None reported or documented in medical history or problem list   Health Maintenance Behaviors: Annual physical exam Healing Pattern: Average Health Facilitated by: Rest  Neurological Neurological Review of Symptoms: No symptoms reported Neurological Self-Management Outcome: 4 (good)  HEENT HEENT Symptoms Reported: No symptoms reported HEENT Management Strategies: Routine screening HEENT Self-Management Outcome: 4 (good)    Cardiovascular Cardiovascular Symptoms Reported: No symptoms reported Does patient have uncontrolled Hypertension?: Yes Is patient checking Blood Pressure at home?: Yes Patient's Recent BP reading at home: 144/88 Cardiovascular Management Strategies: Routine screening Cardiovascular Self-Management Outcome: 4 (good)  Respiratory Respiratory Symptoms Reported: Wheezing Respiratory Management Strategies: Routine screening Respiratory  Self-Management Outcome: 3 (uncertain)  Endocrine Endocrine Symptoms Reported: No symptoms reported Is patient diabetic?: Yes Is patient checking blood sugars at home?: Yes List most recent blood sugar readings, include date and time of day: 06-19-24 at 0800 131 Endocrine Self-Management Outcome: 4 (good)  Gastrointestinal Gastrointestinal Symptoms Reported: No symptoms reported Gastrointestinal Management Strategies: Coping strategies Gastrointestinal Self-Management Outcome: 4 (good) Nutrition Risk Screen (CP): No indicators present  Genitourinary Genitourinary Symptoms Reported: Malodorous urine Genitourinary Self-Management Outcome: 4 (good)  Integumentary Integumentary Symptoms Reported: Rash Skin Management Strategies: Routine screening Skin Self-Management Outcome: 3 (uncertain)  Musculoskeletal Musculoskelatal Symptoms Reviewed: Weakness Musculoskeletal Management Strategies: Routine screening, Medical device Musculoskeletal Self-Management Outcome: 3 (uncertain) Falls in the past year?: No Number of falls in past year: 1 or less Was there an injury with Fall?: No Fall Risk Category Calculator: 0 Patient Fall Risk Level: Low Fall Risk Patient at Risk for Falls Due to: No Fall Risks Fall risk Follow up: Falls evaluation completed  Psychosocial Psychosocial Symptoms Reported: No symptoms reported Behavioral Management Strategies: Coping strategies Behavioral Health Self-Management Outcome: 3 (uncertain) Major Change/Loss/Stressor/Fears (CP): Medical condition, self Techniques to Cope with Loss/Stress/Change: Diversional activities Quality of Family Relationships: supportive Do you feel physically threatened by others?: No    06/20/2024    PHQ2-9 Depression Screening   Little interest or pleasure in doing things Not at all  Feeling down, depressed, or hopeless Not at all  PHQ-2 - Total Score 0  Trouble falling or staying asleep, or sleeping too much    Feeling tired or  having little energy    Poor appetite or overeating     Feeling bad about yourself - or that you are a failure or have let yourself or your family down    Trouble concentrating on things,  such as reading the newspaper or watching television    Moving or speaking so slowly that other people could have noticed.  Or the opposite - being so fidgety or restless that you have been moving around a lot more than usual    Thoughts that you would be better off dead, or hurting yourself in some way    PHQ2-9 Total Score    If you checked off any problems, how difficult have these problems made it for you to do your work, take care of things at home, or get along with other people    Depression Interventions/Treatment      Vitals:   06/20/24 1447  BP: (!) 144/88    Medications Reviewed Today     Reviewed by Bertrum Rosina HERO, RN (Registered Nurse) on 06/20/24 at 1438  Med List Status: <None>   Medication Order Taking? Sig Documenting Provider Last Dose Status Informant  acetaminophen  (TYLENOL ) 500 MG tablet [id number] Yes Take 500 mg by mouth every 6 (six) hours as needed. [provider]  Active   AMBULATORY NON FORMULARY MEDICATION [id number] Yes Vacuum erection device. Dispense 1. Gerldine Lauraine BROCKS, FNP  Active   amLODipine  (NORVASC ) 10 MG tablet [id number] Yes Take 1 tablet by mouth once daily Del Orbe Polanco, [place], FNP  Active   aspirin  EC 81 MG tablet [id number] Yes Take 81 mg by mouth daily. [provider]  Active Family Member  Blood Glucose Monitoring Suppl DEVI [id number] Yes 1 each by Does not apply route in the morning, at noon, and at bedtime. May substitute to any manufacturer covered by patient's insurance. Tobie Suzzane POUR, MD  Active   Blood Pressure Monitor DEVI [id number] Yes 1 each by Does not apply route daily. Del Wilhelmena Lloyd Sola, FNP  Active   Blood Pressure Monitoring (BLOOD PRESSURE KIT) DEVI [id number] Yes Take blood pressure reading once daily Del Orbe  Polanco, [place], FNP  Active   ciclopirox  (PENLAC ) 8 % solution [id number] Yes Apply topically at bedtime. Apply over nail and surrounding skin. Apply daily over previous coat. After seven (7) days, may remove with alcohol and continue cycle. Del Orbe Polanco, [place], FNP  Active   gabapentin  (NEURONTIN ) 600 MG tablet [id number] Yes Take 1 tablet (600 mg total) by mouth 3 (three) times daily. Del Orbe Polanco, [place], FNP  Active   Glucose Blood (BLOOD GLUCOSE TEST STRIPS) STRP [id number] Yes 1 each by In Vitro route in the morning, at noon, and at bedtime. May substitute to any manufacturer covered by patient's insurance. Patel, Rutwik K, MD  Active   ketoconazole (NIZORAL) 2 % cream [id number] Yes Apply 1 Application topically 2 (two) times daily. [provider]  Active   Lancet Device MISC [id number] Yes 1 each by Does not apply route in the morning, at noon, and at bedtime. May substitute to any manufacturer covered by patient's insurance. Tobie Suzzane POUR, MD  Active   losartan  (COZAAR ) 50 MG tablet [id number] Yes Take 1 tablet (50 mg total) by mouth daily. Alvan Dorn FALCON, MD  Active   metFORMIN  (GLUCOPHAGE ) 500 MG tablet [id number] Yes Take 1 tablet by mouth once daily with breakfast Del Wilhelmena Lloyd, [place], FNP  Active   methimazole  (TAPAZOLE ) 5 MG tablet [id number] Yes Take 1 tablet (5 mg total) by mouth 2 (two) times daily with a meal. With breakfast and supper Nida, Gebreselassie W, MD  Active   mirtazapine  (REMERON ) 7.5 MG tablet [id number] Yes Take 1 tablet (7.5 mg total) by  mouth at bedtime. Tobie Suzzane POUR, MD  Active   omeprazole  [facility]) 40 MG capsule [id number] Yes Take 1 capsule (40 mg total) by mouth 2 (two) times daily. Cindie Carlin POUR, DO  Active            Med Note MAZIE, [facility]   Mon Mar 27, 2023  3:32 PM)    rosuvastatin  (CRESTOR ) 10 MG tablet [id number] Yes Take 1 tablet (10 mg total) by mouth daily. Alvan Dorn FALCON, MD  Active   sildenafil  (REVATIO ) 20 MG tablet  [id number] Yes Take 1 tablet (20 mg total) by mouth daily. McKenzie, Belvie CROME, MD  Active   sildenafil  (VIAGRA ) 100 MG tablet [id number] Yes Take 1 tablet (100 mg total) by mouth as needed for erectile dysfunction. McKenzie, Belvie CROME, MD  Active   tiZANidine  (ZANAFLEX ) 4 MG tablet [id number] Yes TAKE 1 TABLET BY MOUTH EVERY 12 HOURS AS NEEDED FOR MUSCLE SPASM Del Orbe Polanco, [place], FNP  Active   traMADol  (ULTRAM ) 50 MG tablet [id number] Yes Take 1 tablet (50 mg total) by mouth daily as needed. Del Wilhelmena Lloyd Sola, FNP  Active            Med Note (ROBB, MELANIE A   Mon Oct 23, 2023  4:32 PM) Did not like the way it made him feel            Recommendation:   Continue Current Plan of Care  Follow Up Plan:   Telephone follow-up in 1 month  Rosina Forte, BSN RN [facility], [facility] RN Care Manager Direct Dial: [phone]  Fax: [phone]

## 2024-06-24 ENCOUNTER — Other Ambulatory Visit: Payer: Self-pay

## 2024-06-24 ENCOUNTER — Other Ambulatory Visit (HOSPITAL_COMMUNITY): Payer: Self-pay

## 2024-06-27 ENCOUNTER — Ambulatory Visit: Admitting: Pulmonary Disease

## 2024-06-28 ENCOUNTER — Other Ambulatory Visit (HOSPITAL_COMMUNITY)
Admission: RE | Admit: 2024-06-28 | Discharge: 2024-06-28 | Disposition: A | Discharge status: Home / Self Care | Source: Ambulatory Visit | Attending: "Endocrinology | Admitting: "Endocrinology

## 2024-06-28 DIAGNOSIS — E0590 Thyrotoxicosis, unspecified without thyrotoxic crisis or storm: Secondary | ICD-10-CM | POA: Insufficient documentation

## 2024-06-28 LAB — CBC WITH DIFFERENTIAL/PLATELET
Abs Immature Granulocytes: 0.01 K/uL (ref 0.00–0.07)
Basophils Absolute: 0 K/uL (ref 0.0–0.1)
Basophils Relative: 1 %
Eosinophils Absolute: 0.3 K/uL (ref 0.0–0.5)
Eosinophils Relative: 4 %
HCT: 46.4 % (ref 39.0–52.0)
Hemoglobin: 15.7 g/dL (ref 13.0–17.0)
Immature Granulocytes: 0 %
Lymphocytes Relative: 40 %
Lymphs Abs: 2.8 K/uL (ref 0.7–4.0)
MCH: 28.8 pg (ref 26.0–34.0)
MCHC: 33.8 g/dL (ref 30.0–36.0)
MCV: 85.1 fL (ref 80.0–100.0)
Monocytes Absolute: 0.7 K/uL (ref 0.1–1.0)
Monocytes Relative: 11 %
Neutro Abs: 3.1 K/uL (ref 1.7–7.7)
Neutrophils Relative %: 44 %
Platelets: 170 K/uL (ref 150–400)
RBC: 5.45 MIL/uL (ref 4.22–5.81)
RDW: 14.9 % (ref 11.5–15.5)
WBC: 6.9 K/uL (ref 4.0–10.5)
nRBC: 0 % (ref 0.0–0.2)

## 2024-06-28 LAB — T4, FREE: Free T4: 1.32 ng/dL — ABNORMAL HIGH (ref 0.61–1.12)

## 2024-06-28 LAB — TSH: TSH: <0.1 u[IU]/mL — ABNORMAL LOW (ref 0.350–4.500)

## 2024-06-29 ENCOUNTER — Other Ambulatory Visit: Payer: Self-pay | Admitting: Internal Medicine

## 2024-06-29 LAB — T3, FREE: T3, Free: 3.9 pg/mL (ref 2.0–4.4)

## 2024-07-04 ENCOUNTER — Ambulatory Visit (INDEPENDENT_AMBULATORY_CARE_PROVIDER_SITE_OTHER): Admitting: "Endocrinology

## 2024-07-04 ENCOUNTER — Encounter: Payer: Self-pay | Admitting: "Endocrinology

## 2024-07-04 VITALS — BP 128/86 | HR 68 | Ht 66.0 in | Wt 169.8 lb

## 2024-07-04 DIAGNOSIS — E1165 Type 2 diabetes mellitus with hyperglycemia: Secondary | ICD-10-CM | POA: Diagnosis not present

## 2024-07-04 DIAGNOSIS — F17200 Nicotine dependence, unspecified, uncomplicated: Secondary | ICD-10-CM | POA: Diagnosis not present

## 2024-07-04 DIAGNOSIS — E0590 Thyrotoxicosis, unspecified without thyrotoxic crisis or storm: Secondary | ICD-10-CM | POA: Diagnosis not present

## 2024-07-04 DIAGNOSIS — Z794 Long term (current) use of insulin: Secondary | ICD-10-CM

## 2024-07-04 DIAGNOSIS — I10 Essential (primary) hypertension: Secondary | ICD-10-CM | POA: Diagnosis not present

## 2024-07-04 MED ORDER — METHIMAZOLE 5 MG PO TABS: ORAL_TABLET | ORAL | 1 refills | Status: AC

## 2024-07-04 NOTE — Progress Notes (Signed)
 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   Arthritis    CAD (coronary artery disease)    Nonobstructive at cardiac catheterization July 2018 - [facility]   Carpal tunnel syndrome    Depression    Diabetes mellitus without complication (HCC)    Essential hypertension    GERD (gastroesophageal reflux disease)    Hepatitis C    Sleep apnea    Past Surgical History:  Procedure Laterality Date   AORTIC VALVE REPLACEMENT  03/2017   [facility]   BIOPSY  12/15/2022   Procedure: BIOPSY;  Surgeon: Cindie Carlin POUR, DO;  [facility];  Service: Endoscopy;;   COLONOSCOPY WITH PROPOFOL  N/A 12/15/2022   Procedure: COLONOSCOPY WITH PROPOFOL ;  Surgeon: Cindie Carlin POUR, DO;  [facility];  Service: Endoscopy;  Laterality: N/A;  10:00 am, asa 3   ESOPHAGOGASTRODUODENOSCOPY (EGD) WITH PROPOFOL  N/A 12/15/2022   Procedure: ESOPHAGOGASTRODUODENOSCOPY (EGD) WITH PROPOFOL ;  Surgeon: Cindie Carlin POUR, DO;  [facility];  Service: Endoscopy;  Laterality: N/A;   POLYPECTOMY  12/15/2022   Procedure: POLYPECTOMY;  Surgeon: Cindie Carlin POUR, DO;  [facility];  Service: Endoscopy;;   Social History   Socioeconomic History   Marital status: Divorced    Spouse name: Not on file   Number of children: Not on file   Years of education: Not on file   Highest education level: Not on file  Occupational History   Not on file  Tobacco Use   Smoking status: Former    Types: Cigarettes   Smokeless tobacco:  Never  Vaping Use   Vaping status: Never Used  Substance and Sexual Activity   Alcohol use: Yes    Comment: socially   Drug use: No   Sexual activity: Yes  Other Topics Concern   Not on file  Social History Narrative   Not on file   Social Drivers of Health   Financial Resource Strain: Low Risk  (08/22/2023)   Overall Financial Resource Strain (CARDIA)    Difficulty of Paying Living Expenses: Not very hard  Food Insecurity: No Food Insecurity (04/18/2024)   Hunger Vital Sign    Worried About Running Out of Food in the Last Year: Never true    Ran Out of Food in the Last Year: Never true  Transportation Needs: No Transportation Needs (04/18/2024)   PRAPARE - Administrator, Civil Service (Medical): No    Lack of Transportation (Non-Medical): No  Physical Activity: Not on file  Stress: Not on file  Social Connections: Not on file   Family History  Problem Relation Age of Onset   Diabetes Mother    Heart attack Mother    Heart attack Father    Outpatient  Encounter Medications as of 07/04/2024  Medication Sig   metFORMIN  (GLUCOPHAGE ) 500 MG tablet Take 1 tablet by mouth once daily with breakfast (Patient taking differently: Take 500 mg by mouth daily as needed.)   Accu-Chek Softclix Lancets lancets USE TO CHECK BLOOD SUGAR IN THE MORNING, AT NOON, AND AT BEDTIME.   acetaminophen  (TYLENOL ) 500 MG tablet Take 500 mg by mouth every 6 (six) hours as needed.   AMBULATORY NON FORMULARY MEDICATION Vacuum erection device. Dispense 1.   amLODipine  (NORVASC ) 10 MG tablet Take 1 tablet by mouth once daily   aspirin  EC 81 MG tablet Take 81 mg by mouth daily.   Blood Glucose Monitoring Suppl DEVI 1 each by Does not apply route in the morning, at noon, and at bedtime. May substitute to any manufacturer covered by patient's insurance.   Blood Pressure Monitor DEVI 1 each by Does not apply route daily.   Blood Pressure Monitoring (BLOOD PRESSURE KIT) DEVI Take blood pressure reading  once daily   ciclopirox  (PENLAC ) 8 % solution Apply topically at bedtime. Apply over nail and surrounding skin. Apply daily over previous coat. After seven (7) days, may remove with alcohol and continue cycle.   gabapentin  (NEURONTIN ) 600 MG tablet Take 1 tablet (600 mg total) by mouth 3 (three) times daily.   Glucose Blood (BLOOD GLUCOSE TEST STRIPS) STRP 1 each by In Vitro route in the morning, at noon, and at bedtime. May substitute to any manufacturer covered by patient's insurance.   ketoconazole (NIZORAL) 2 % cream Apply 1 Application topically 2 (two) times daily.   Lancet Device MISC 1 each by Does not apply route in the morning, at noon, and at bedtime. May substitute to any manufacturer covered by patient's insurance.   losartan  (COZAAR ) 50 MG tablet Take 1 tablet (50 mg total) by mouth daily.   methimazole  (TAPAZOLE ) 5 MG tablet Take 2 tablets (10mg ) daily with breakfast and 1 tablet (5mg ) daily with supper   mirtazapine  (REMERON ) 7.5 MG tablet Take 1 tablet (7.5 mg total) by mouth at bedtime.   omeprazole  (PRILOSEC) 40 MG capsule Take 1 capsule (40 mg total) by mouth 2 (two) times daily.   rosuvastatin  (CRESTOR ) 10 MG tablet Take 1 tablet (10 mg total) by mouth daily.   sildenafil  (REVATIO ) 20 MG tablet Take 1 tablet (20 mg total) by mouth daily.   sildenafil  (VIAGRA ) 100 MG tablet Take 1 tablet (100 mg total) by mouth as needed for erectile dysfunction.   tiZANidine  (ZANAFLEX ) 4 MG tablet TAKE 1 TABLET BY MOUTH EVERY 12 HOURS AS NEEDED FOR MUSCLE SPASM   traMADol  (ULTRAM ) 50 MG tablet Take 1 tablet (50 mg total) by mouth daily as needed.   [DISCONTINUED] methimazole  (TAPAZOLE ) 5 MG tablet Take 1 tablet (5 mg total) by mouth 2 (two) times daily with a meal. With breakfast and supper   No facility-administered encounter medications on file as of 07/04/2024.   ALLERGIES: No Known Allergies  VACCINATION STATUS: Immunization History  Administered Date(s) Administered   Fluad  Trivalent(High Dose 65+) 06/19/2023   Td 03/04/2019    HPI Kirk Oconnor is 68 y.o. male who presents today with a medical history as above. he is being seen in follow-up after he was seen in consultation for hyperthyroidism requested by Del Kirk Lloyd Sola, FNP.   Patient is not an optimal historian, came alone today.   He was diagnosed with hyperthyroidism in April 2024.    -He is currently on minimal dose of methimazole   5 mg p.o. daily twice daily to which he is responding.    His previsit thyroid  function tests are consistent with improvement, however still above target.  He does not have new complaints today.  His previous symptoms of heat intolerance, sweating and palpitations have resolved.  His weight is more or less stabilizing.  His thyroid  uptake and scan showed 35% uptake at 24 hours.  He denies family history of thyroid  dysfunction.  His other medical problems include hypertension on treatment with amlodipine .  He also has type 2 diabetes currently on metformin  500 mg p.o. once a day.  His recent A1c was 6.6%, progressively improving.   He also has high blood pressure and hypercholesterolemia on treatment.    He denies dysphagia, shortness of breath, nor voice change.  He denies any personal history of goiter. Patient is chronic smoker.  Review of Systems  Constitutional: + Mildly fluctuating body weight,   + fatigue, no subjective hyperthermia, no subjective hypothermia   Objective:       07/04/2024   10:52 AM 06/20/2024    2:47 PM 06/06/2024    1:58 PM  Vitals with BMI  Height 5' 6  5' 6  Weight 169 lbs 13 oz  165 lbs 10 oz  BMI 27.42  26.74  Systolic 128 144 883  Diastolic 86 88 70  Pulse 68  77    BP 128/86   Pulse 68   Ht 5' 6 (1.676 m)   Wt 169 lb 12.8 oz (77 kg)   BMI 27.41 kg/m   Wt Readings from Last 3 Encounters:  07/04/24 169 lb 12.8 oz (77 kg)  06/06/24 165 lb 9.6 oz (75.1 kg)  05/22/24 166 lb (75.3 kg)    Physical  Exam  Constitutional:  Body mass index is 27.41 kg/m.,  not in acute distress, normal state of mind Eyes: PERRLA, EOMI, no exophthalmos ENT: moist mucous membranes, no gross thyromegaly, no gross cervical lymphadenopathy    CMP ( most recent) CMP     Component Value Date/Time   NA 129 (L) 05/22/2024 1111   K 5.0 05/22/2024 1111   CL 94 (L) 05/22/2024 1111   CO2 21 05/22/2024 1111   GLUCOSE 83 05/22/2024 1111   GLUCOSE 107 (H) 09/29/2023 1145   BUN 16 05/22/2024 1111   CREATININE 1.07 05/22/2024 1111   CREATININE 0.98 03/11/2013 1036   CALCIUM  9.7 05/22/2024 1111   PROT 6.9 01/17/2024 1142   ALBUMIN 4.2 01/17/2024 1142   AST 23 01/17/2024 1142   ALT 32 01/17/2024 1142   ALKPHOS 89 01/17/2024 1142   BILITOT 0.8 01/17/2024 1142   EGFR 77 05/22/2024 1111   GFRNONAA >60 09/29/2023 1145   GFRNONAA 87 03/11/2013 1036     Diabetic Labs (most recent): Lab Results  Component Value Date   HGBA1C 6.6 (H) 05/22/2024   HGBA1C 6.5 02/28/2024   HGBA1C 6.6 (H) 10/19/2023   MICROALBUR 6.0 (H) 01/27/2022     Lipid Panel ( most recent) Lipid Panel     Component Value Date/Time   CHOL 157 05/22/2024 1111   TRIG 83 05/22/2024 1111   HDL 43 05/22/2024 1111   CHOLHDL 3.7 05/22/2024 1111   CHOLHDL 3.6 02/15/2024 1148   VLDL 13 02/15/2024 1148   LDLCALC 98 05/22/2024 1111   LABVLDL 16 05/22/2024 1111      Lab Results  Component Value Date   TSH <0.100 (L) 06/28/2024   TSH <0.010 (L) 02/15/2024   TSH 0.022 (L) 09/29/2023  TSH 0.075 (L) 04/05/2023   TSH 0.026 (L) 03/27/2023   FREET4 1.32 (H) 06/28/2024   FREET4 1.51 (H) 02/15/2024   FREET4 1.03 09/29/2023   FREET4 0.86 04/05/2023   FREET4 1.07 03/27/2023       Assessment & Plan:   1. Hyperthyroidism  2.  Hypertension   3.  Hyperlipidemia 4.  Type 2 diabetes  - I have reviewed his new and available thyroid  records and clinically evaluated the patient along with the presence of his sister in the room.   -In light of  his questionable compliance, ablating his thyroid  will create a chronic condition which he may have difficulty following up with.  Given the low uptake of 35%, I offered him none definitive treatment with methimazole  in lieu of RAI ablation.   He presents with thyroid  function test showing improvement, however still above target, advised to increase methimazole  to 10 mg p.o. daily at breakfast and 5 mg p.o. daily at supper until next measurement in 4 months.      Side effects and precautions with this medicine were discussed with the family. His body weight is stabilizing, his previsit labs show A1c of 6.6%.  He is advised to continue metformin  500 mg p.o. daily at breakfast.     For his hyperlipidemia and hypertension, he is advised to continue rosuvastatin , amlodipine  as ordered.  The patient was counseled on the dangers of tobacco use, and was advised to quit.  Reviewed strategies to maximize success, including removing cigarettes and smoking materials from environment.   - he is advised to maintain close follow up with Del Kirk Lloyd Sola, FNP for primary care needs.   I spent  22  minutes in the care of the patient today including review of labs from Thyroid  Function, CMP, and other relevant labs ; imaging/biopsy records (current and previous including abstractions from other facilities); face-to-face time discussing  his lab results and symptoms, medications doses, his options of short and long term treatment based on the latest standards of care / guidelines;   and documenting the encounter.  Kirk Oconnor  participated in the discussions, expressed understanding, and voiced agreement with the above plans.  All questions were answered to his satisfaction. he is encouraged to contact clinic should he have any questions or concerns prior to his return visit.   Follow up plan: Return in about 4 months (around 11/04/2024) for F/U with Pre-visit Labs.   Ranny Earl, MD [facility] [address] [place], [place] [address] Phone: [phone]  Fax: [phone]     07/04/2024, 11:10 AM  This note was partially dictated with voice recognition software. Similar sounding words can be transcribed inadequately or may not  be corrected upon review.

## 2024-07-05 ENCOUNTER — Other Ambulatory Visit: Payer: Self-pay | Admitting: Family Medicine

## 2024-07-11 ENCOUNTER — Other Ambulatory Visit: Payer: Self-pay | Admitting: Family Medicine

## 2024-07-19 ENCOUNTER — Ambulatory Visit: Payer: Self-pay

## 2024-07-19 ENCOUNTER — Other Ambulatory Visit: Payer: Self-pay | Admitting: *Deleted

## 2024-07-19 NOTE — Patient Instructions (Signed)
 Visit Information  Thank you for taking time to visit with me today. Please don't hesitate to contact me if I can be of assistance to you before our next scheduled appointment.  Your next care management appointment is by telephone on 08-19-2024 at 9:30 am  Telephone follow-up in 1 month  Please call the care guide team at [phone] if you need to cancel, schedule, or reschedule an appointment.   Please call the Suicide and Crisis Lifeline: 988 call the [place]  National Suicide Prevention Lifeline: 867-580-4037 or TTY: 256-671-7664 TTY (725) 166-2265) to talk to a trained counselor call 1-800-273-TALK (toll free, 24 hour hotline) if you are experiencing a Mental Health or Behavioral Health Crisis or need someone to talk to.  Rosina Forte, BSN RN [facility], [facility] RN Care Manager Direct Dial: [phone]  Fax: [phone]

## 2024-07-19 NOTE — Patient Outreach (Signed)
 Complex Care Management   Visit Note  07/19/2024  Name:  Kirk Oconnor MRN: [id number] DOB: 08/04/1956  Situation: Referral received for Complex Care Management related to Diabetes with Complications and HTN I obtained verbal consent from Patient.  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   Arthritis    CAD (coronary artery disease)    Nonobstructive at cardiac catheterization July 2018 - [facility]   Carpal tunnel syndrome    Depression    Diabetes mellitus without complication (HCC)    Essential hypertension    GERD (gastroesophageal reflux disease)    Hepatitis C    Sleep apnea     Assessment: Patient Reported Symptoms:  Cognitive Cognitive Status: No symptoms reported   Health Maintenance Behaviors: Annual physical exam Healing Pattern: Average Health Facilitated by: Rest  Neurological Neurological Review of Symptoms: Dizziness, Numbness    HEENT HEENT Symptoms Reported: No symptoms reported      Cardiovascular Cardiovascular Symptoms Reported: No symptoms reported Does patient have uncontrolled Hypertension?: Yes Is patient checking Blood Pressure at home?: Yes Patient's Recent BP reading at home: 164/78 Cardiovascular Management Strategies: Routine screening Cardiovascular Self-Management Outcome: 4 (good)  Respiratory Respiratory Symptoms Reported: Productive cough Respiratory Management Strategies: Routine screening  Endocrine Endocrine Symptoms Reported: No symptoms reported Is patient diabetic?: Yes Is patient checking blood sugars at home?: Yes List most recent blood sugar readings, include date and time of day: 07-19-2024 at 0800 119 Endocrine  Self-Management Outcome: 4 (good)  Gastrointestinal Gastrointestinal Symptoms Reported: Constipation Gastrointestinal Self-Management Outcome: 3 (uncertain) Nutrition Risk Screen (CP): No indicators present  Genitourinary Genitourinary Symptoms Reported: Malodorous urine Genitourinary Self-Management Outcome: 3 (uncertain)  Integumentary Integumentary Symptoms Reported: Rash Additional Integumentary Details: Rash on back of neck - patient is calling to get an earlier appointment Skin Self-Management Outcome: 3 (uncertain)  Musculoskeletal Musculoskelatal Symptoms Reviewed: Weakness Musculoskeletal Management Strategies: Routine screening Musculoskeletal Self-Management Outcome: 3 (uncertain) Falls in the past year?: No Number of falls in past year: 1 or less Was there an injury with Fall?: No Fall Risk Category Calculator: 0 Patient Fall Risk Level: Low Fall Risk Patient at Risk for Falls Due to: No Fall Risks Fall risk Follow up: Falls evaluation completed  Psychosocial Psychosocial Symptoms Reported: No symptoms reported Behavioral Management Strategies: Coping strategies Behavioral Health Self-Management Outcome: 4 (good) Major Change/Loss/Stressor/Fears (CP): Medical condition, self Techniques to Cope with Loss/Stress/Change: Diversional activities Quality of Family Relationships: supportive    07/19/2024    PHQ2-9 Depression Screening   Little interest or pleasure in doing things Not at all  Feeling down, depressed, or hopeless Not at all  PHQ-2 - Total Score 0  Trouble falling or staying asleep, or sleeping too much    Feeling tired or having little energy    Poor appetite or overeating     Feeling bad about yourself - or that you are a failure or have let yourself or your family down    Trouble concentrating on things, such as reading the newspaper or watching television    Moving or speaking so slowly that other people could have noticed.  Or the opposite - being so  fidgety or restless that  you have been moving around a lot more than usual    Thoughts that you would be better off dead, or hurting yourself in some way    PHQ2-9 Total Score    If you checked off any problems, how difficult have these problems made it for you to do your work, take care of things at home, or get along with other people    Depression Interventions/Treatment      Vitals:   07/19/24 1032  BP: (!) 164/78    Medications Reviewed Today     Reviewed by Bertrum Rosina HERO, RN (Registered Nurse) on 07/19/24 at 1029  Med List Status: <None>   Medication Order Taking? Sig Documenting Provider Last Dose Status Informant  Accu-Chek Softclix Lancets lancets [id number] Yes USE TO CHECK BLOOD SUGAR IN THE MORNING, AT NOON, AND AT BEDTIME. Del Orbe Polanco, Hilario, FNP  Active   acetaminophen  (TYLENOL ) 500 MG tablet [id number] Yes Take 500 mg by mouth every 6 (six) hours as needed. [provider]  Active   AMBULATORY NON FORMULARY MEDICATION [id number]  Vacuum erection device. Dispense 1. Gerldine Lauraine BROCKS, FNP  Active   amLODipine  (NORVASC ) 10 MG tablet [id number] Yes Take 1 tablet by mouth once daily Del Orbe Polanco, [place], FNP  Active   aspirin  EC 81 MG tablet [id number] Yes Take 81 mg by mouth daily. [provider]  Active Family Member  Blood Glucose Monitoring Suppl DEVI [id number] Yes 1 each by Does not apply route in the morning, at noon, and at bedtime. May substitute to any manufacturer covered by patient's insurance. Tobie Suzzane POUR, MD  Active   Blood Pressure Monitor DEVI [id number] Yes 1 each by Does not apply route daily. Del Wilhelmena Lloyd Hilario, FNP  Active   Blood Pressure Monitoring (BLOOD PRESSURE KIT) DEVI [id number] Yes Take blood pressure reading once daily Del Orbe Polanco, [place], FNP  Active   ciclopirox  (PENLAC ) 8 % solution [id number]  Apply topically at bedtime. Apply over nail and surrounding skin. Apply daily over previous coat. After seven (7)  days, may remove with alcohol and continue cycle.  Patient not taking: Reported on 07/19/2024   Del Orbe Polanco, [place], FNP  Active   gabapentin  (NEURONTIN ) 600 MG tablet [id number] Yes Take 1 tablet (600 mg total) by mouth 3 (three) times daily. Del Orbe Polanco, [place], FNP  Active   Glucose Blood (BLOOD GLUCOSE TEST STRIPS) STRP [id number] Yes 1 each by In Vitro route in the morning, at noon, and at bedtime. May substitute to any manufacturer covered by patient's insurance. Patel, Rutwik K, MD  Active   ketoconazole (NIZORAL) 2 % cream [id number] Yes Apply 1 Application topically 2 (two) times daily. [provider]  Active   Lancet Device MISC [id number] Yes 1 each by Does not apply route in the morning, at noon, and at bedtime. May substitute to any manufacturer covered by patient's insurance. Tobie Suzzane POUR, MD  Active   losartan  (COZAAR ) 50 MG tablet [id number] Yes Take 1 tablet (50 mg total) by mouth daily. Alvan Dorn FALCON, MD  Active   metFORMIN  (GLUCOPHAGE ) 500 MG tablet [id number] Yes Take 1 tablet by mouth once daily with breakfast Del Wilhelmena Lloyd, [place], FNP  Active   methimazole  (TAPAZOLE ) 5 MG tablet [id number] Yes Take 2 tablets (10mg ) daily with breakfast and 1 tablet (5mg ) daily with supper  Patient taking differently: Take 2 tablets (10mg ) daily with breakfast and 2 tablet (10mg ) daily with supper   Nida, Gebreselassie W,  MD  Active   mirtazapine  (REMERON ) 7.5 MG tablet [id number] Yes Take 1 tablet (7.5 mg total) by mouth at bedtime. Tobie Suzzane POUR, MD  Active   omeprazole  [facility]) 40 MG capsule [id number] Yes Take 1 capsule (40 mg total) by mouth 2 (two) times daily. Cindie Carlin POUR, DO  Active            Med Note MAZIE, [facility]   Mon Mar 27, 2023  3:32 PM)    rosuvastatin  (CRESTOR ) 10 MG tablet [id number] Yes Take 1 tablet (10 mg total) by mouth daily. Alvan Dorn FALCON, MD  Active   sildenafil  (REVATIO ) 20 MG tablet [id number] Yes Take 1 tablet (20 mg total) by  mouth daily. McKenzie, Belvie CROME, MD  Active   sildenafil  (VIAGRA ) 100 MG tablet [id number] Yes Take 1 tablet (100 mg total) by mouth as needed for erectile dysfunction. McKenzie, Belvie CROME, MD  Active   tiZANidine  (ZANAFLEX ) 4 MG tablet [id number] Yes TAKE 1 TABLET BY MOUTH EVERY 12 HOURS AS NEEDED FOR MUSCLE SPASM Del Orbe Polanco, [place], FNP  Active   traMADol  (ULTRAM ) 50 MG tablet [id number] Yes Take 1 tablet (50 mg total) by mouth daily as needed. Del Wilhelmena Lloyd Sola, FNP  Active            Med Note (ROBB, MELANIE A   Mon Oct 23, 2023  4:32 PM) Did not like the way it made him feel            Recommendation:   Continue Current Plan of Care  Follow Up Plan:   Telephone follow-up in 1 month  Rosina Forte, BSN RN [facility], [facility] RN Care Manager Direct Dial: [phone]  Fax: [phone]

## 2024-07-19 NOTE — Telephone Encounter (Signed)
 FYI Only or Action Required?: Action required by provider: clinical question for provider. Pt called about rash he thinks is related to new rx for gabapentin , please advise. Sent to ED for neuro symptoms pt reported later during triage.  Patient was last seen in primary care on 05/22/2024 by Kirk Wilhelmena Lloyd Hilario, FNP.  Called Nurse Triage reporting Rash.  Symptoms began about a month ago.  Interventions attempted: OTC medications: ketoconazole.  Symptoms are: gradually worsening.  Triage Disposition: Go to ED Now (or PCP Triage)  Patient/caregiver understands and will follow disposition?: Yes  Copied from CRM [id number]. Topic: Clinical - Red Word Triage >> Jul 19, 2024 12:52 PM [name] wrote: Red Word that prompted transfer to Nurse Triage: Patient is calling bc he is breaking out due to the medication gabapentin  (NEURONTIN ) 600 MG tablet he is stating that he has a rash and it is dark in color on the back of his neck and side of face.  Kirk Oconnor. Reason for Disposition  Patient sounds very sick or weak to the triager  Answer Assessment - Initial Assessment Questions Pt started new rx for gabapentin  on 9/24 by PCP. Thinks rash may be related. Currently using ketoconazole on rash, mildly helpful. Reported he has also been experiencing severe intermittent weakness in extremities and blurry vision, as well as mild numbness and tingling in hands and feet starting 4-5 weeks ago. Has had to started using a FWW to get around d/t weakness. Pt noted to be speaking in clear coherent sentences during call. Advised ED, reports his niece can take him later today. Does not have any other means of transportation to go to ED sooner, declines EMS. Advised to call 911 if symptoms worsen.  1. APPEARANCE of RASH: What does the rash look like? (e.g., blisters, dry flaky skin, red spots, redness, sores)     Dry flaky rash. Reports color of cheeks are starting to look darker.   2. LOCATION: Where  is the rash located?      Back of neck and both sides of face.  3. NUMBER: How many spots are there?      Multiple on back of neck and sides of face.  4. SIZE: How big are the spots? (e.g., inches, cm; or compare to size of pinhead, tip of pen, eraser, pea)      Patches of spots size of a pencil head on back of neck. Larger patches on face.   5. ONSET: When did the rash start?      3-4 weeks ago  6. ITCHING: Does the rash itch? If Yes, ask: How bad is the itch?  (Scale 0-10; or none, mild, moderate, severe)     Back of neck severe itching  7. PAIN: Does the rash hurt? If Yes, ask: How bad is the pain?  (Scale 0-10; or none, mild, moderate, severe)     None  8. OTHER SYMPTOMS: Do you have any other symptoms? (e.g., fever)     Mid dizziness 4-5 weeks ago off and on. Mild numbness and tingling in hands and feet starting about 4-5 weeks ago. Onset of intermittent severe blurry vision starting 4-5 weeks ago that resolves after sitting down. No changes to speech. No increased SOB or difficulty swallowing. No lip tongue or throat swelling. Rash not near eyes.  Answer Assessment - Initial Assessment Questions Blurry vision episodes occurs once every few days, lasts about 1 hr, goes away when sitting down. Numbness and tingling occurs daily, lasts several  hours. Uses a FWW recently in past 4-5 weeks.  1. SYMPTOM: What is the main symptom you are concerned about? (e.g., weakness, numbness)     Significant weakness of arms and legs, severe intermittent numbness and tingling hands and feet, severe intermittent blurry vision.  2. ONSET: When did this start? (e.g., minutes, hours, days; while sleeping)     4-5 weeks ago   3. LAST NORMAL: When was the last time you (the patient) were normal (no symptoms)?     4-5 weeks ago  4. PATTERN Does this come and go, or has it been constant since it started?  Is it present now?     Weakness, numbness and tingling and blurry vision  come and go. Currently has weakness. Mild blurry vision and numbness and tingling now.  5. CARDIAC SYMPTOMS: Have you had any of the following symptoms: chest pain, difficulty breathing, palpitations?     No  6. NEUROLOGIC SYMPTOMS: Have you had any of the following symptoms: headache, dizziness, vision loss, double vision, changes in speech, unsteady on your feet?     Mild dizziness starting 4-5 weeks ago. No changes to speech. Feels a little off balance which has been going on a while.  7. OTHER SYMPTOMS: Do you have any other symptoms?     No.  Protocols used: Rash or Redness - Localized-A-AH, Neurologic Deficit-A-AH

## 2024-07-22 NOTE — Telephone Encounter (Signed)
 Noted patient advised ED

## 2024-07-24 NOTE — Progress Notes (Unsigned)
 New Patient Pulmonology Office Visit   Subjective:  Patient ID: Kirk Oconnor, male    DOB: 28-Mar-1956  MRN: [id number]  Referred by: Del Orbe Polanco, Ilian*  CC: No chief complaint on file.   HPI ALBARO DEVINEY is a 68 y.o. male with hx of HTN, DM, GERD, CAD, and OSA who presents for initial evaluation of the latter.   The Epworth Sleepiness score is ***/24.   {STOPBANG:33649}   {PULM QUESTIONNAIRES (Optional):33196}  ROS  Allergies: Patient has no known allergies.  Current Outpatient Medications:    Accu-Chek Softclix Lancets lancets, USE TO CHECK BLOOD SUGAR IN THE MORNING, AT NOON, AND AT BEDTIME., Disp: 100 each, Rfl: 0   acetaminophen  (TYLENOL ) 500 MG tablet, Take 500 mg by mouth every 6 (six) hours as needed., Disp: , Rfl:    AMBULATORY NON FORMULARY MEDICATION, Vacuum erection device. Dispense 1., Disp: 1 each, Rfl: 0   amLODipine  (NORVASC ) 10 MG tablet, Take 1 tablet by mouth once daily, Disp: 30 tablet, Rfl: 0   aspirin  EC 81 MG tablet, Take 81 mg by mouth daily., Disp: , Rfl:    Blood Glucose Monitoring Suppl DEVI, 1 each by Does not apply route in the morning, at noon, and at bedtime. May substitute to any manufacturer covered by patient's insurance., Disp: 1 each, Rfl: 0   Blood Pressure Monitor DEVI, 1 each by Does not apply route daily., Disp: 1 each, Rfl: 0   Blood Pressure Monitoring (BLOOD PRESSURE KIT) DEVI, Take blood pressure reading once daily, Disp: 1 each, Rfl: 0   ciclopirox  (PENLAC ) 8 % solution, Apply topically at bedtime. Apply over nail and surrounding skin. Apply daily over previous coat. After seven (7) days, may remove with alcohol and continue cycle. (Patient not taking: Reported on 07/19/2024), Disp: 6.6 mL, Rfl: 3   gabapentin  (NEURONTIN ) 600 MG tablet, Take 1 tablet (600 mg total) by mouth 3 (three) times daily., Disp: 90 tablet, Rfl: 2   Glucose Blood (BLOOD GLUCOSE TEST STRIPS) STRP, 1 each by In Vitro route in the morning, at noon, and  at bedtime. May substitute to any manufacturer covered by patient's insurance., Disp: 100 strip, Rfl: 1   ketoconazole (NIZORAL) 2 % cream, Apply 1 Application topically 2 (two) times daily., Disp: , Rfl:    Lancet Device MISC, 1 each by Does not apply route in the morning, at noon, and at bedtime. May substitute to any manufacturer covered by patient's insurance., Disp: 1 each, Rfl: 0   losartan  (COZAAR ) 50 MG tablet, Take 1 tablet (50 mg total) by mouth daily., Disp: 90 tablet, Rfl: 3   metFORMIN  (GLUCOPHAGE ) 500 MG tablet, Take 1 tablet by mouth once daily with breakfast, Disp: 90 tablet, Rfl: 0   methimazole  (TAPAZOLE ) 5 MG tablet, Take 2 tablets (10mg ) daily with breakfast and 1 tablet (5mg ) daily with supper (Patient taking differently: Take 2 tablets (10mg ) daily with breakfast and 2 tablet (10mg ) daily with supper), Disp: 270 tablet, Rfl: 1   mirtazapine  (REMERON ) 7.5 MG tablet, Take 1 tablet (7.5 mg total) by mouth at bedtime., Disp: 30 tablet, Rfl: 0   omeprazole  (PRILOSEC) 40 MG capsule, Take 1 capsule (40 mg total) by mouth 2 (two) times daily., Disp: 60 capsule, Rfl: 11   rosuvastatin  (CRESTOR ) 10 MG tablet, Take 1 tablet (10 mg total) by mouth daily., Disp: 90 tablet, Rfl: 3   sildenafil  (REVATIO ) 20 MG tablet, Take 1 tablet (20 mg total) by mouth daily., Disp: 30 tablet, Rfl:  11   sildenafil  (VIAGRA ) 100 MG tablet, Take 1 tablet (100 mg total) by mouth as needed for erectile dysfunction., Disp: 30 tablet, Rfl: 5   tiZANidine  (ZANAFLEX ) 4 MG tablet, TAKE 1 TABLET BY MOUTH EVERY 12 HOURS AS NEEDED FOR MUSCLE SPASM, Disp: 60 tablet, Rfl: 2   traMADol  (ULTRAM ) 50 MG tablet, Take 1 tablet (50 mg total) by mouth daily as needed., Disp: 30 tablet, Rfl: 0 Past Medical History:  Diagnosis Date   Anxiety    Aortic regurgitation 03/2017   27 mm Medtronic freestyle porcine root with direct reimplantation of the left and right coronary ostia, replacement of ascending aorta with a 28 mm Gelweave  Dacron graft - [facility]   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   Arthritis    CAD (coronary artery disease)    Nonobstructive at cardiac catheterization July 2018 - [facility]   Carpal tunnel syndrome    Depression    Diabetes mellitus without complication (HCC)    Essential hypertension    GERD (gastroesophageal reflux disease)    Hepatitis C    Sleep apnea    Past Surgical History:  Procedure Laterality Date   AORTIC VALVE REPLACEMENT  03/2017   [facility]   BIOPSY  12/15/2022   Procedure: BIOPSY;  Surgeon: Cindie Carlin POUR, DO;  [facility];  Service: Endoscopy;;   COLONOSCOPY WITH PROPOFOL  N/A 12/15/2022   Procedure: COLONOSCOPY WITH PROPOFOL ;  Surgeon: Cindie Carlin POUR, DO;  Location: [facility];  Service: Endoscopy;  Laterality: N/A;  10:00 am, asa 3   ESOPHAGOGASTRODUODENOSCOPY (EGD) WITH PROPOFOL  N/A 12/15/2022   Procedure: ESOPHAGOGASTRODUODENOSCOPY (EGD) WITH PROPOFOL ;  Surgeon: Cindie Carlin POUR, DO;  [facility];  Service: Endoscopy;  Laterality: N/A;   POLYPECTOMY  12/15/2022   Procedure: POLYPECTOMY;  Surgeon: Cindie Carlin POUR, DO;  Location: [facility];  Service: Endoscopy;;   Family History  Problem Relation Age of Onset   Diabetes Mother    Heart attack Mother    Heart attack Father    Social History   Socioeconomic History   Marital status: Divorced    Spouse name: Not on file   Number of children: Not on file   Years of education: Not on file   Highest education level: Not on file  Occupational History   Not on file  Tobacco Use   Smoking status: Former    Types: Cigarettes   Smokeless tobacco: Never  Vaping Use   Vaping status: Never Used  Substance and Sexual Activity   Alcohol use: Yes    Comment: socially   Drug use: No   Sexual activity: Yes  Other Topics Concern   Not on file  Social History Narrative   Not on file   Social Drivers of Health   Financial Resource Strain: Low Risk  (08/22/2023)   Overall Financial  Resource Strain (CARDIA)    Difficulty of Paying Living Expenses: Not very hard  Food Insecurity: No Food Insecurity (04/18/2024)   Hunger Vital Sign    Worried About Running Out of Food in the Last Year: Never true    Ran Out of Food in the Last Year: Never true  Transportation Needs: No Transportation Needs (04/18/2024)   PRAPARE - Administrator, Civil Service (Medical): No    Lack of Transportation (Non-Medical): No  Physical Activity: Not on file  Stress: Not on file  Social Connections: Not on file  Intimate Partner Violence: Not At Risk (04/18/2024)  Humiliation, Afraid, Rape, and Kick questionnaire    Fear of Current or Ex-Partner: No    Emotionally Abused: No    Physically Abused: No    Sexually Abused: No       Objective:  There were no vitals taken for this visit. {Pulm Vitals (Optional):32837}  Physical Exam  Diagnostic Review:  {Labs (Optional):32838}     Assessment & Plan:   Assessment & Plan   No orders of the defined types were placed in this encounter.     No follow-ups on file.   Kyen Taite, MD

## 2024-07-25 ENCOUNTER — Encounter: Payer: Self-pay | Admitting: Pulmonary Disease

## 2024-07-25 ENCOUNTER — Ambulatory Visit (INDEPENDENT_AMBULATORY_CARE_PROVIDER_SITE_OTHER): Admitting: Pulmonary Disease

## 2024-07-25 VITALS — BP 112/73 | HR 102 | Ht 66.0 in | Wt 167.6 lb

## 2024-07-25 DIAGNOSIS — G4733 Obstructive sleep apnea (adult) (pediatric): Secondary | ICD-10-CM

## 2024-07-25 NOTE — Patient Instructions (Signed)
  VISIT SUMMARY: Today, we discussed your long-standing sleep apnea and the issues you are experiencing with excessive daytime sleepiness and loud snoring. We talked about resuming CPAP therapy and the next steps to address your condition.  YOUR PLAN: OBSTRUCTIVE SLEEP APNEA: You have chronic obstructive sleep apnea, which is causing excessive daytime sleepiness and frequent awakenings at night. -We will order a home sleep test to evaluate the current severity of your sleep apnea. -Based on the results of the sleep test, we will arrange for you to start CPAP therapy. -We discussed the advancements in CPAP technology since you last used it.  Contains text generated by [employer].

## 2024-08-02 ENCOUNTER — Ambulatory Visit (INDEPENDENT_AMBULATORY_CARE_PROVIDER_SITE_OTHER): Admitting: Physician Assistant

## 2024-08-02 ENCOUNTER — Telehealth: Payer: Self-pay | Admitting: *Deleted

## 2024-08-02 ENCOUNTER — Encounter: Payer: Self-pay | Admitting: Physician Assistant

## 2024-08-02 VITALS — BP 134/85 | HR 83 | Temp 98.0°F | Ht 66.0 in | Wt 169.0 lb

## 2024-08-02 DIAGNOSIS — R300 Dysuria: Secondary | ICD-10-CM | POA: Diagnosis not present

## 2024-08-02 NOTE — Assessment & Plan Note (Signed)
 Patient presents today with nocturia and dysuria x 3 weeks. Symptoms suggest possible kidney infection. Urine sample required for further evaluation. - Obtained urine sample for analysis and culture. - Will call with urine analysis results later in the afternoon. - Will prescribe medication based on urine culture results.  - Urinalysis reviewed and not significant for UTI, negative for leukocytes, nitrites, and blood. Symptoms likely related to symptomatic BPH. No antibiotics indicated at this time. Awaiting urine culture. - He appears well today in office, overall benign exam with mild generalized abdominal tenderness and mild right flank pain. He is not febrile in office. He is at his baseline. - Patient advised to follow up with PCP for further evaluation if urinary symptoms or dizziness do not improve.  - Advised to increase hydration and monitor for new fevers, N/V, blood in his urine, or altered mental status.

## 2024-08-02 NOTE — Progress Notes (Signed)
 Acute Office Visit  Subjective:     Patient ID: Kirk Oconnor, male    DOB: 1955-11-03, 68 y.o.   MRN: [id number]   Discussed the use of AI scribe software for clinical note transcription with the patient, who gave verbal consent to proceed.  History of Present Illness Kirk Oconnor is a 68 year old male who presents with frequent urination and kidney pain.  He experiences frequent urination, particularly nocturia, for the past three weeks, along with dysuria. There is a sensation of pressure on his kidneys while walking. His urine appears dark with a strong odor. He is currently taking gabapentin , recently increased from 300 mg to 600 mg, and suspects it may affect his kidneys. He feels dizzy and weak for the past three to four weeks. There is no fever, nausea, or vomiting.    Review of Systems  Constitutional:  Positive for malaise/fatigue. Negative for chills and fever.  Gastrointestinal:  Negative for nausea and vomiting.  Genitourinary:  Positive for dysuria, flank pain and frequency. Negative for hematuria and urgency.  Neurological:  Negative for dizziness and headaches.        Objective:     BP 134/85 (BP Location: Right Arm, Patient Position: Sitting)   Pulse 83   Temp 98 F (36.7 C)   Ht 5' 6 (1.676 m)   Wt 169 lb (76.7 kg)   SpO2 95%   BMI 27.28 kg/m   Physical Exam Constitutional:      General: He is not in acute distress.    Appearance: Normal appearance. He is not ill-appearing.  HENT:     Head: Normocephalic and atraumatic.     Mouth/Throat:     Mouth: Mucous membranes are moist.     Pharynx: Oropharynx is clear.  Eyes:     Extraocular Movements: Extraocular movements intact.     Conjunctiva/sclera: Conjunctivae normal.  Cardiovascular:     Rate and Rhythm: Normal rate and regular rhythm.     Heart sounds: Normal heart sounds. No murmur heard. Pulmonary:     Effort: Pulmonary effort is normal.     Breath sounds: Normal breath sounds.   Abdominal:     General: Abdomen is flat. Bowel sounds are normal.     Palpations: Abdomen is soft.     Tenderness: There is generalized abdominal tenderness. There is right CVA tenderness. There is no left CVA tenderness.  Skin:    General: Skin is warm and dry.  Neurological:     General: No focal deficit present.     Mental Status: He is alert and oriented to person, place, and time.  Psychiatric:        Mood and Affect: Mood normal.        Behavior: Behavior normal.     No results found for any visits on 08/02/24.      Assessment & Plan:  Dysuria Assessment & Plan: Patient presents today with nocturia and dysuria x 3 weeks. Symptoms suggest possible kidney infection. Urine sample required for further evaluation. - Obtained urine sample for analysis and culture. - Will call with urine analysis results later in the afternoon. - Will prescribe medication based on urine culture results.  - Urinalysis reviewed and not significant for UTI, negative for leukocytes, nitrites, and blood. Symptoms likely related to symptomatic BPH. No antibiotics indicated at this time. Awaiting urine culture. - He appears well today in office, overall benign exam with mild generalized abdominal tenderness and mild right flank pain. He  is not febrile in office. He is at his baseline. - Patient advised to follow up with PCP for further evaluation if urinary symptoms or dizziness do not improve.  - Advised to increase hydration and monitor for new fevers, N/V, blood in his urine, or altered mental status.   Orders: -     Urinalysis -     Urine Culture    No follow-ups on file.  Charmaine Ellan Tess, PA-C

## 2024-08-02 NOTE — Telephone Encounter (Signed)
-----   Message from [place] G[place] sent at 08/02/2024  1:23 PM EST ----- Regarding: Results Please call to advise patient of urine results.   Urinalysis reviewed and not significant for UTI, overall urine was very clean. Symptoms likely related to symptomatic BPH. No antibiotics indicated at this time. Awaiting urine culture.  Advised to follow up with PCP for further evaluation if urinary symptoms or dizziness do not improve.   Advise to increase hydration and monitor for new fevers, N/V, blood in his urine, or altered mental status.    Thanks.

## 2024-08-03 ENCOUNTER — Other Ambulatory Visit: Payer: Self-pay | Admitting: Family Medicine

## 2024-08-05 ENCOUNTER — Telehealth: Payer: Self-pay

## 2024-08-05 NOTE — Telephone Encounter (Signed)
 Patient notified and verbalized understanding- still having issues and requested follow up- follow up scheduled

## 2024-08-05 NOTE — Telephone Encounter (Signed)
Duplicate- see other message

## 2024-08-05 NOTE — Telephone Encounter (Signed)
 Copied from CRM [id number]. Topic: Clinical - Lab/Test Results >> Aug 05, 2024  9:26 AM [name] wrote: Reason for CRM: patient called regarding urine labs from 11/07 please c/b [phone]

## 2024-08-06 ENCOUNTER — Encounter: Payer: Self-pay | Admitting: Family Medicine

## 2024-08-06 ENCOUNTER — Ambulatory Visit: Admitting: Family Medicine

## 2024-08-06 VITALS — BP 136/88 | HR 93 | Resp 18 | Wt 171.0 lb

## 2024-08-06 DIAGNOSIS — E119 Type 2 diabetes mellitus without complications: Secondary | ICD-10-CM

## 2024-08-06 DIAGNOSIS — R300 Dysuria: Secondary | ICD-10-CM | POA: Diagnosis not present

## 2024-08-06 DIAGNOSIS — I10 Essential (primary) hypertension: Secondary | ICD-10-CM

## 2024-08-06 DIAGNOSIS — R10A3 Flank pain, bilateral: Secondary | ICD-10-CM | POA: Diagnosis not present

## 2024-08-06 MED ORDER — NITROFURANTOIN MONOHYD MACRO 100 MG PO CAPS: 100.0000 mg | ORAL_CAPSULE | Freq: Two times a day (BID) | ORAL | 0 refills | Status: DC

## 2024-08-06 NOTE — Patient Instructions (Addendum)
 F/U in 2 to 4 weeks with available Provider  I have prescribed 1 week of antibioitc, nitrofurantoin,  for  you based on symptoms reported, we will call with results  You are referred for US  of your kidneys, please keep appt    cBC with diff , cmp and eGFR , urine aCR , urine c/s today  Thanks for choosing [facility], we consider it a privelige to serve you.

## 2024-08-07 ENCOUNTER — Ambulatory Visit: Payer: Self-pay | Admitting: Family Medicine

## 2024-08-07 ENCOUNTER — Ambulatory Visit: Payer: Self-pay | Admitting: Physician Assistant

## 2024-08-07 LAB — URINALYSIS
Bilirubin, UA: NEGATIVE
Glucose, UA: NEGATIVE
Ketones, UA: NEGATIVE
Leukocytes,UA: NEGATIVE
Nitrite, UA: NEGATIVE
Protein,UA: NEGATIVE
RBC, UA: NEGATIVE
Specific Gravity, UA: 1.02 (ref 1.005–1.030)
Urobilinogen, Ur: 1 mg/dL (ref 0.2–1.0)
pH, UA: 7 (ref 5.0–7.5)

## 2024-08-07 LAB — URINE CULTURE

## 2024-08-08 LAB — CBC WITH DIFFERENTIAL/PLATELET
Basophils Absolute: 0 x10E3/uL (ref 0.0–0.2)
Basos: 0 %
EOS (ABSOLUTE): 0.5 x10E3/uL — ABNORMAL HIGH (ref 0.0–0.4)
Eos: 5 %
Hematocrit: 47.4 % (ref 37.5–51.0)
Hemoglobin: 15.7 g/dL (ref 13.0–17.7)
Immature Grans (Abs): 0 x10E3/uL (ref 0.0–0.1)
Immature Granulocytes: 0 %
Lymphocytes Absolute: 3.7 x10E3/uL — ABNORMAL HIGH (ref 0.7–3.1)
Lymphs: 39 %
MCH: 28.9 pg (ref 26.6–33.0)
MCHC: 33.1 g/dL (ref 31.5–35.7)
MCV: 87 fL (ref 79–97)
Monocytes Absolute: 0.9 x10E3/uL (ref 0.1–0.9)
Monocytes: 9 %
Neutrophils Absolute: 4.2 x10E3/uL (ref 1.4–7.0)
Neutrophils: 47 %
Platelets: 220 x10E3/uL (ref 150–450)
RBC: 5.44 x10E6/uL (ref 4.14–5.80)
RDW: 13.9 % (ref 11.6–15.4)
WBC: 9.3 x10E3/uL (ref 3.4–10.8)

## 2024-08-08 LAB — CMP14+EGFR
ALT: 30 IU/L (ref 0–44)
AST: 23 IU/L (ref 0–40)
Albumin: 4.1 g/dL (ref 3.9–4.9)
Alkaline Phosphatase: 96 IU/L (ref 47–123)
BUN/Creatinine Ratio: 18 (ref 10–24)
BUN: 18 mg/dL (ref 8–27)
Bilirubin Total: 0.6 mg/dL (ref 0.0–1.2)
CO2: 21 mmol/L (ref 20–29)
Calcium: 9.9 mg/dL (ref 8.6–10.2)
Chloride: 94 mmol/L — ABNORMAL LOW (ref 96–106)
Creatinine, Ser: 1 mg/dL (ref 0.76–1.27)
Globulin, Total: 3.2 g/dL (ref 1.5–4.5)
Glucose: 80 mg/dL (ref 70–99)
Potassium: 4.8 mmol/L (ref 3.5–5.2)
Sodium: 130 mmol/L — ABNORMAL LOW (ref 134–144)
Total Protein: 7.3 g/dL (ref 6.0–8.5)
eGFR: 83 mL/min/1.73 (ref >59)

## 2024-08-08 LAB — URINE CULTURE

## 2024-08-08 LAB — MICROALBUMIN / CREATININE URINE RATIO
Creatinine, Urine: 96 mg/dL
Microalb/Creat Ratio: 11 mg/g{creat} (ref 0–29)
Microalbumin, Urine: 10.6 ug/mL

## 2024-08-09 ENCOUNTER — Telehealth: Payer: Self-pay

## 2024-08-09 NOTE — Telephone Encounter (Signed)
 Copied from CRM [id number]. Topic: Clinical - Lab/Test Results >> Aug 09, 2024  4:23 PM Delon HERO wrote: Reason for CRM: Patient is calling to report that he is still having frequent urination during the night. Patient expressed understanding to lab results. Also reporting that he has not heard anything about his sleep study.

## 2024-08-12 ENCOUNTER — Telehealth: Payer: Self-pay

## 2024-08-12 ENCOUNTER — Encounter: Payer: Self-pay | Admitting: Family Medicine

## 2024-08-12 DIAGNOSIS — R10A3 Flank pain, bilateral: Secondary | ICD-10-CM

## 2024-08-12 DIAGNOSIS — R3915 Urgency of urination: Secondary | ICD-10-CM

## 2024-08-12 DIAGNOSIS — R350 Frequency of micturition: Secondary | ICD-10-CM

## 2024-08-12 DIAGNOSIS — R300 Dysuria: Secondary | ICD-10-CM

## 2024-08-12 NOTE — Addendum Note (Signed)
 Addended by: ANTONETTA QUANT E on: 08/12/2024 04:03 PM   Modules accepted: Orders

## 2024-08-12 NOTE — Assessment & Plan Note (Signed)
 3 week history with symptoms suggestive of UTI and longstanding hTN, needs renal US 

## 2024-08-12 NOTE — Telephone Encounter (Signed)
 I have referred him to urology let him know pls and I requested soonest available in [place] , [place] or [employer]

## 2024-08-12 NOTE — Progress Notes (Signed)
 Kirk Oconnor     MRN: [id number]      DOB: 1956/03/05  Chief Complaint  Patient presents with   Urinary Tract Infection    Pt here with complaints of burning, frequency, and urgency in urination, and lower back pain x3 weeks. Denies fever. Was seen on Friday for this and urine was neg      HPI Kirk Oconnor is here with above concern Symptoms are not improving, has ahd 2previous clinical evaluations for this concern since symptom onset with no improvement, first episode  Denies polyuria, polydipsia, blurred vision , or hypoglycemic episodes.    ROS See HPI  Denies recent fever or chills. Denies sinus pressure, nasal congestion, ear pain or sore throat. Denies chest congestion, productive cough or wheezing. Denies chest pains, palpitations and leg swelling Denies abdominal pain, nausea, vomiting,diarrhea or constipation.    Denies skin break down or rash.   PE  BP 136/88   Pulse 93   Resp 18   Wt 171 lb 0.6 oz (77.6 kg)   SpO2 97%   BMI 27.61 kg/m    Patient alert and oriented and in no cardiopulmonary distress.Anxious because of new illness  HEENT: No facial asymmetry, EOMI,     Neck supple .  Chest: Clear to auscultation bilaterally.  CVS: S1, S2 no murmurs, no S3.Regular rate.  ABD: Soft bilateral flank pain   Ext: No edema  MS: Adequate ROM spine, shoulders, hips and knees.  Skin: Intact, no ulcerations or rash noted.  Psych: Good eye contact, normal affect. Memory intact not anxious or depressed appearing.  CNS: CN 2-12 intact, power,  normal throughout.no focal deficits noted.   Assessment & Plan  Bilateral flank pain 3 week history with symptoms suggestive of UTI and longstanding hTN, needs renal US   Type 2 diabetes mellitus (HCC) Denies polyuria, polydipsia, blurred vision , or hypoglycemic episodes. Updated HBA1C reveals good control Send Urine aCR Diabetes associated with hypertension and hyperlipidemia    Latest Ref Rng & Units  08/06/2024    4:16 PM 05/22/2024   11:11 AM 02/28/2024   10:49 AM 02/15/2024   11:48 AM 01/17/2024   11:42 AM  Diabetic Labs  HbA1c 4.8 - 5.6 %  6.6  6.5     Micro/Creat Ratio 0 - 29 mg/g creat 11       Chol 100 - 199 mg/dL  842   879  869   HDL >60 mg/dL  43   33  33   Calc LDL 0 - 99 mg/dL  98   74  82   Triglycerides 0 - 149 mg/dL  83   65  75   Creatinine 0.76 - 1.27 mg/dL 8.99  8.92    8.85       08/06/2024    3:34 PM 08/02/2024   10:52 AM 07/25/2024    2:18 PM 07/19/2024   10:32 AM 07/04/2024   10:52 AM 06/20/2024    2:47 PM 06/06/2024    1:58 PM  BP/Weight  Systolic BP 135 134 112 164 128 144 116  Diastolic BP 90 85 73 78 86 88 70  Wt. (Lbs) 171.04 169 167.6  169.8  165.6  BMI 27.61 kg/m2 27.28 kg/m2 27.05 kg/m2  27.41 kg/m2  26.73 kg/m2      05/22/2024   10:20 AM 01/20/2023   10:20 AM  Foot/eye exam completion dates  Foot Form Completion Done Done        Essential hypertension, benign Controlled, no  change in medication   Hypertension Controlled, no change in medication   Dysuria 3 week history, no  cause identified will reat presumptively for infection, submit culture and refer yo Urology

## 2024-08-12 NOTE — Assessment & Plan Note (Addendum)
 Denies polyuria, polydipsia, blurred vision , or hypoglycemic episodes. Updated HBA1C reveals good control Send Urine aCR Diabetes associated with hypertension and hyperlipidemia    Latest Ref Rng & Units 08/06/2024    4:16 PM 05/22/2024   11:11 AM 02/28/2024   10:49 AM 02/15/2024   11:48 AM 01/17/2024   11:42 AM  Diabetic Labs  HbA1c 4.8 - 5.6 %  6.6  6.5     Micro/Creat Ratio 0 - 29 mg/g creat 11       Chol 100 - 199 mg/dL  842   879  869   HDL >60 mg/dL  43   33  33   Calc LDL 0 - 99 mg/dL  98   74  82   Triglycerides 0 - 149 mg/dL  83   65  75   Creatinine 0.76 - 1.27 mg/dL 8.99  8.92    8.85       08/06/2024    3:34 PM 08/02/2024   10:52 AM 07/25/2024    2:18 PM 07/19/2024   10:32 AM 07/04/2024   10:52 AM 06/20/2024    2:47 PM 06/06/2024    1:58 PM  BP/Weight  Systolic BP 135 134 112 164 128 144 116  Diastolic BP 90 85 73 78 86 88 70  Wt. (Lbs) 171.04 169 167.6  169.8  165.6  BMI 27.61 kg/m2 27.28 kg/m2 27.05 kg/m2  27.41 kg/m2  26.73 kg/m2      05/22/2024   10:20 AM 01/20/2023   10:20 AM  Foot/eye exam completion dates  Foot Form Completion Done Done

## 2024-08-12 NOTE — Telephone Encounter (Signed)
 Copied from CRM [id number]. Topic: Clinical - Medical Advice >> Aug 12, 2024 10:59 AM Nathanel BROCKS wrote: Reason for CRM: pt say Dr Antonetta last week (11/10) and did  a urine culture. He was also given a short dose of antibiotics. I read the results to the pt and in those it said if pt is still having issues that she would send pt to urologist. He is still having those issues and needs the referral. If the referral is booked out for a while, let pt know if he needs to come in so that we can start some other testing. He is very uncomfortable with this. Please call pt back to discuss this matter.

## 2024-08-16 ENCOUNTER — Ambulatory Visit (HOSPITAL_COMMUNITY)
Admission: RE | Admit: 2024-08-16 | Discharge: 2024-08-16 | Disposition: A | Discharge status: Home / Self Care | Source: Ambulatory Visit | Attending: Family Medicine | Admitting: Family Medicine

## 2024-08-16 DIAGNOSIS — R10A3 Flank pain, bilateral: Secondary | ICD-10-CM | POA: Diagnosis present

## 2024-08-17 NOTE — Assessment & Plan Note (Signed)
 Controlled, no change in medication

## 2024-08-17 NOTE — Assessment & Plan Note (Signed)
 3 week history, no  cause identified will reat presumptively for infection, submit culture and refer yo Urology

## 2024-08-19 ENCOUNTER — Telehealth: Payer: Self-pay

## 2024-08-19 ENCOUNTER — Other Ambulatory Visit: Payer: Self-pay | Admitting: *Deleted

## 2024-08-19 NOTE — Patient Instructions (Signed)
 Visit Information  Thank you for taking time to visit with me today. Please don't hesitate to contact me if I can be of assistance to you before our next scheduled appointment.  Your next care management appointment is by telephone on 09-16-2024 at 9:00 am  Telephone follow-up in 1 month  Please call the care guide team at [phone] if you need to cancel, schedule, or reschedule an appointment.   Please call the Suicide and Crisis Lifeline: 988 call the [place]  National Suicide Prevention Lifeline: 262 530 3432 or TTY: 304-422-1096 TTY 510-490-9704) to talk to a trained counselor call 1-800-273-TALK (toll free, 24 hour hotline) if you are experiencing a Mental Health or Behavioral Health Crisis or need someone to talk to.  Rosina Forte, BSN RN [facility], [facility] RN Care Manager Direct Dial: [phone]  Fax: [phone]

## 2024-08-19 NOTE — Telephone Encounter (Signed)
 Copied from CRM [id number]. Topic: Clinical - Lab/Test Results >> Aug 19, 2024  1:38 PM Joesph B wrote: Reason for CRM: patient is calling for results. He had a US  renal

## 2024-08-19 NOTE — Patient Outreach (Signed)
 Complex Care Management   Visit Note  08/19/2024  Name:  Kirk Oconnor MRN: [id number] DOB: 1955/10/04  Situation: Referral received for Complex Care Management related to Diabetes with Complications and HTN and Hyperthyroidism I obtained verbal consent from Patient.  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   Arthritis    CAD (coronary artery disease)    Nonobstructive at cardiac catheterization July 2018 - [facility]   Carpal tunnel syndrome    Depression    Diabetes mellitus without complication (HCC)    Essential hypertension    GERD (gastroesophageal reflux disease)    Hepatitis C    Sleep apnea     Assessment: Patient Reported Symptoms:  Cognitive Cognitive Status: No symptoms reported Cognitive/Intellectual Conditions Management [RPT]: None reported or documented in medical history or problem list   Health Maintenance Behaviors: Annual physical exam Healing Pattern: Average Health Facilitated by: Rest  Neurological Neurological Review of Symptoms: Numbness, Weakness Neurological Management Strategies: Routine screening, Medication therapy Neurological Self-Management Outcome: 3 (uncertain)  HEENT HEENT Symptoms Reported: No symptoms reported HEENT Management Strategies: Routine screening HEENT Self-Management Outcome: 4 (good)    Cardiovascular Cardiovascular Symptoms Reported: No symptoms reported Does patient have uncontrolled Hypertension?: Yes Is patient checking Blood Pressure at home?: Yes Patient's Recent BP reading at home: 136/74 (patient reported) Cardiovascular Management Strategies: Medication therapy Cardiovascular Self-Management Outcome: 4  (good)  Respiratory Respiratory Symptoms Reported: No symptoms reported Respiratory Management Strategies: Routine screening Respiratory Self-Management Outcome: 4 (good)  Endocrine Endocrine Symptoms Reported: Increased urination Is patient diabetic?: Yes Is patient checking blood sugars at home?: Yes List most recent blood sugar readings, include date and time of day: 08-19-2024 at 0945 182 Endocrine Self-Management Outcome: 3 (uncertain)  Gastrointestinal Gastrointestinal Symptoms Reported: Constipation Gastrointestinal Management Strategies: Medication therapy Gastrointestinal Self-Management Outcome: 3 (uncertain)    Genitourinary Genitourinary Symptoms Reported: Frequency, Other Other Genitourinary Symptoms: Questioning whether blood in urine or just dark in color. Currently collecting urine for provider Genitourinary Self-Management Outcome: 3 (uncertain)  Integumentary Integumentary Symptoms Reported: No symptoms reported Skin Management Strategies: Routine screening Skin Self-Management Outcome: 4 (good)  Musculoskeletal Musculoskelatal Symptoms Reviewed: Weakness, Back pain Musculoskeletal Management Strategies: Routine screening Musculoskeletal Self-Management Outcome: 4 (good) Falls in the past year?: No Number of falls in past year: 1 or less Was there an injury with Fall?: No Fall Risk Category Calculator: 0 Patient Fall Risk Level: Low Fall Risk Fall risk Follow up: Falls evaluation completed  Psychosocial Psychosocial Symptoms Reported: Anxiety - if selected complete GAD, Depression - if selected complete PHQ 2-9, Sadness - if selected complete PHQ 2-9 Behavioral Management Strategies: Coping strategies Behavioral Health Self-Management Outcome: 3 (uncertain) Major Change/Loss/Stressor/Fears (CP): Medical condition, self Techniques to Cope with Loss/Stress/Change: Diversional activities Do you feel physically threatened by others?: No    08/19/2024    PHQ2-9  Depression Screening   Little interest or pleasure in doing things Several days  Feeling down, depressed, or hopeless Nearly every day  PHQ-2 - Total Score 4  Trouble falling or staying asleep, or sleeping too much Nearly every day  Feeling tired or having little energy Nearly every day  Poor appetite or overeating  Not at all  Feeling bad about yourself - or that you are a failure or have let yourself or your family down Nearly every day  Trouble concentrating on things, such as reading the newspaper or watching television Not at all  Moving or speaking so slowly that other people could have noticed.  Or the opposite - being so fidgety or restless that you have been moving around a lot more than usual Not at all  Thoughts that you would be better off dead, or hurting yourself in some way Not at all  PHQ2-9 Total Score 13  If you checked off any problems, how difficult have these problems made it for you to do your work, take care of things at home, or get along with other people Not difficult at all  Depression Interventions/Treatment      Today's Vitals   08/18/24 0943  BP: 136/74   Pain Scale: 0-10 Pain Score: 10-Worst pain ever Pain Type: Chronic pain Pain Location: Back Pain Intervention(s): Medication (See eMAR)  Medications Reviewed Today     Reviewed by Bertrum Rosina HERO, RN (Registered Nurse) on 08/19/24 at 0940  Med List Status: <None>   Medication Order Taking? Sig Documenting Provider Last Dose Status Informant  Accu-Chek Softclix Lancets lancets [id number]  USE TO CHECK BLOOD SUGAR IN THE MORNING, AT NOON, AND AT BEDTIME. Del Orbe Polanco, Hilario, FNP  Active   acetaminophen  (TYLENOL ) 500 MG tablet [id number] Yes Take 500 mg by mouth every 6 (six) hours as needed. [provider]  Active   AMBULATORY NON FORMULARY MEDICATION [id number]  Vacuum erection device. Dispense 1. Gerldine Lauraine BROCKS, FNP  Active   amLODipine  (NORVASC ) 10 MG tablet [id number] Yes Take 1 tablet  by mouth once daily Bacchus, Gloria Z, FNP  Active   aspirin  EC 81 MG tablet [id number]  Take 81 mg by mouth daily. [provider]  Active Family Member  Blood Glucose Monitoring Suppl DEVI [id number]  1 each by Does not apply route in the morning, at noon, and at bedtime. May substitute to any manufacturer covered by patient's insurance. Tobie Suzzane POUR, MD  Active   Blood Pressure Monitor DEVI [id number] Yes 1 each by Does not apply route daily. Del Wilhelmena Lloyd Hilario, FNP  Active   Blood Pressure Monitoring (BLOOD PRESSURE KIT) DEVI [id number] Yes Take blood pressure reading once daily Del Orbe Polanco, [place], FNP  Active   ciclopirox  (PENLAC ) 8 % solution [id number]  Apply topically at bedtime. Apply over nail and surrounding skin. Apply daily over previous coat. After seven (7) days, may remove with alcohol and continue cycle.  Patient not taking: Reported on 08/19/2024   Del Orbe Polanco, Iliana, FNP  Active   gabapentin  (NEURONTIN ) 600 MG tablet [id number] Yes Take 1 tablet (600 mg total) by mouth 3 (three) times daily. Del Orbe Polanco, [place], FNP  Active   Glucose Blood (BLOOD GLUCOSE TEST STRIPS) STRP [id number] Yes 1 each by In Vitro route in the morning, at noon, and at bedtime. May substitute to any manufacturer covered by patient's insurance. Patel, Rutwik K, MD  Active   ketoconazole (NIZORAL) 2 % cream [id number] Yes Apply 1 Application topically 2 (two) times daily. [provider]  Active   Lancet Device MISC [id number] Yes 1 each by Does not apply route in the morning, at noon, and at bedtime. May substitute to any manufacturer covered by patient's insurance. Tobie Suzzane POUR, MD  Active   losartan  (COZAAR ) 50 MG tablet [id number]  Yes Take 1 tablet (50 mg total) by mouth daily. Alvan Dorn FALCON, MD  Active   metFORMIN  (GLUCOPHAGE ) 500 MG tablet [id number] Yes Take 1 tablet by mouth once daily with breakfast Del Wilhelmena Falter, [place], FNP  Active   methimazole  (TAPAZOLE ) 5  MG tablet [id number] Yes Take 2 tablets (10mg ) daily with breakfast and 1 tablet (5mg ) daily with supper Nida, Gebreselassie W, MD  Active   mirtazapine  (REMERON ) 7.5 MG tablet [id number]  Take 1 tablet (7.5 mg total) by mouth at bedtime.  Patient not taking: Reported on 08/19/2024   Tobie Suzzane POUR, MD  Active   nitrofurantoin , macrocrystal-monohydrate, (MACROBID ) 100 MG capsule [id number] Yes Take 1 capsule (100 mg total) by mouth 2 (two) times daily. Antonetta Rollene BRAVO, MD  Active   omeprazole  [facility]) 40 MG capsule [id number] Yes Take 1 capsule (40 mg total) by mouth 2 (two) times daily. Cindie Carlin POUR, DO  Active            Med Note MAZIE, [facility]   Mon Mar 27, 2023  3:32 PM)    rosuvastatin  (CRESTOR ) 10 MG tablet [id number] Yes Take 1 tablet (10 mg total) by mouth daily. Alvan Dorn FALCON, MD  Active   sildenafil  (REVATIO ) 20 MG tablet [id number] Yes Take 1 tablet (20 mg total) by mouth daily. McKenzie, Belvie CROME, MD  Active   sildenafil  (VIAGRA ) 100 MG tablet [id number] Yes Take 1 tablet (100 mg total) by mouth as needed for erectile dysfunction. McKenzie, Belvie CROME, MD  Active   tiZANidine  (ZANAFLEX ) 4 MG tablet [id number] Yes TAKE 1 TABLET BY MOUTH EVERY 12 HOURS AS NEEDED FOR MUSCLE SPASM Del Orbe Polanco, Iliana, FNP  Active   traMADol  (ULTRAM ) 50 MG tablet [id number] Yes Take 1 tablet (50 mg total) by mouth daily as needed. Del Wilhelmena Falter Sola, FNP  Active            Med Note (ROBB, MELANIE A   Mon Oct 23, 2023  4:32 PM) Did not like the way it made him feel            Recommendation:   Continue Current Plan of Care  Follow Up Plan:   Telephone follow-up in 1 month  Rosina Forte, BSN RN [facility], [facility] RN Care Manager Direct Dial: [phone]  Fax: [phone]

## 2024-08-21 ENCOUNTER — Ambulatory Visit: Payer: Self-pay

## 2024-08-21 NOTE — Telephone Encounter (Addendum)
 FYI Only or Action Required?: Action required by provider: clinical question for provider, update on patient condition, and lab or test result follow-up needed.  Patient was last seen in primary care on 08/06/2024 by Kirk Rollene BRAVO, MD.  Called Nurse Triage reporting Urinary Frequency.  Symptoms began ongoing since patient was seen for this and he states it has not gotten any better.  Interventions attempted: Prescription medications: antibiotics and Rest, hydration, or home remedies.  Symptoms are: unchanged.  Triage Disposition: Go to ED Now (or PCP Triage) (overriding See Physician Within 24 Hours)  Patient/caregiver understands and will follow disposition?: No, wishes to speak with PCP          Copied from CRM #[id number]. Topic: Clinical - Red Word Triage >> Aug 21, 2024  1:24 PM Kirk Oconnor wrote: Reason for CRM: Patient having difficulty using the bathroom all night, causing pain.   He would also like to know his kidney imaging results. Reason for Disposition  Urinating more frequently than usual (i.e., frequency) OR new-onset of the feeling of an urgent need to urinate (i.e., urgency)  Answer Assessment - Initial Assessment Questions Patient calling for imaging results Urine still has an odor, color in urine, & patient is experiencing frequency  There are no appointments available at PCP office today or at the surrounding offices within the region. Patient is advised that the recommendation is for him to be seen today and Urgent Care is advised at this time or the Emergency Room if symptoms get worse. Patient then stated at the end of the triage that he was frustrated with not getting any results and he states that he is also having dizziness and being off balance.  At this point, this RN advised him that if he is experiencing these symptoms as well it is advised that he goes to the Emergency Room. He states he does not have transportation & this RN advised him that  911 is available as well. Patient just wanted this message sent to his PCP and for someone to call him back today from the office.  Patient is advised to call us  back if anything changes or with any further questions/concerns. Patient is advised that if anything worsens to go to the Emergency Room or call 911. Patient verbalized understanding.    1. SYMPTOM: What's the main symptom you're concerned about? (e.g., frequency, incontinence)     Odor and color to it 2. ONSET: When did the  urinary symptoms  start?     Patient states symptoms never got any better and he finished the antibiotics 3. PAIN: Is there any pain? If Yes, ask: How bad is it? (Scale: 1-10; mild, moderate, severe)     denies  Protocols used: Urinary Symptoms-A-AH

## 2024-08-21 NOTE — Telephone Encounter (Signed)
 Called [facility] and advised them of patient's symptoms and refusal of the ER

## 2024-08-21 NOTE — Telephone Encounter (Signed)
Patient advised ER.

## 2024-08-23 ENCOUNTER — Other Ambulatory Visit: Payer: Self-pay | Admitting: Family Medicine

## 2024-08-26 ENCOUNTER — Telehealth: Payer: Self-pay

## 2024-08-26 NOTE — Telephone Encounter (Signed)
 Copied from CRM #[id number]. Topic: Clinical - Lab/Test Results >> Aug 26, 2024  3:18 PM Kirk Oconnor wrote: Reason for CRM: The patient would like to be contacted by a member of staff to review the results of their recent ultrasound from 08/16/24 when possible

## 2024-08-27 ENCOUNTER — Telehealth: Payer: Self-pay

## 2024-08-27 NOTE — Telephone Encounter (Signed)
 error

## 2024-08-27 NOTE — Telephone Encounter (Signed)
 Pals update pt with urology appt and  provide acute visit with male Provider in office asap, thanks

## 2024-08-28 NOTE — Telephone Encounter (Signed)
Called patient left voicemail to call our office to schedule an appointment

## 2024-08-30 NOTE — Telephone Encounter (Signed)
 Patient returned call and scheduled with Dr Tobie on 12/8

## 2024-08-31 ENCOUNTER — Other Ambulatory Visit: Payer: Self-pay | Admitting: Family Medicine

## 2024-09-02 ENCOUNTER — Ambulatory Visit: Admitting: Internal Medicine

## 2024-09-02 ENCOUNTER — Other Ambulatory Visit: Payer: Self-pay | Admitting: Internal Medicine

## 2024-09-02 DIAGNOSIS — E119 Type 2 diabetes mellitus without complications: Secondary | ICD-10-CM

## 2024-09-06 ENCOUNTER — Ambulatory Visit: Admitting: Internal Medicine

## 2024-09-06 ENCOUNTER — Encounter: Payer: Self-pay | Admitting: Internal Medicine

## 2024-09-06 VITALS — BP 118/79 | HR 75 | Ht 66.0 in | Wt 170.8 lb

## 2024-09-06 DIAGNOSIS — E0590 Thyrotoxicosis, unspecified without thyrotoxic crisis or storm: Secondary | ICD-10-CM

## 2024-09-06 DIAGNOSIS — N401 Enlarged prostate with lower urinary tract symptoms: Secondary | ICD-10-CM

## 2024-09-06 DIAGNOSIS — N281 Cyst of kidney, acquired: Secondary | ICD-10-CM | POA: Insufficient documentation

## 2024-09-06 MED ORDER — TAMSULOSIN HCL 0.4 MG PO CAPS: 0.4000 mg | ORAL_CAPSULE | Freq: Every day | ORAL | 3 refills | Status: DC

## 2024-09-06 NOTE — Progress Notes (Signed)
 Acute Office Visit  Subjective:    Patient ID: Kirk Oconnor, male    DOB: 1956/01/01, 68 y.o.   MRN: [id number]  Chief Complaint  Patient presents with   Cyst    Pt reports sx of left kindey cyst     HPI Patient is in today for complaint of urinary frequency, especially nocturia.  He has also noticed dark urine.  He was treated with Macrobid  for UTI in the last month by Dr. Antonetta, but still reports urinary frequency.  Denies dysuria or hematuria currently.  He had US  of renal, which showed incidental subcentimeter renal cyst and he is preoccupied with the thought that the cyst is giving him symptoms.  I had a lengthy discussion with him about the benign nature of the cyst.  He still believes that he is losing weight, but his weight has been stable since 04/25.  He is currently being treated with methimazole  for hyperthyroidism, and is followed by endocrinology.  Does not report any recent episode of diarrhea, nausea or vomiting.  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   Arthritis    CAD (coronary artery disease)    Nonobstructive at cardiac catheterization July 2018 - [facility]   Carpal tunnel syndrome    Depression    Diabetes mellitus without complication (HCC)    Essential hypertension    GERD (gastroesophageal reflux disease)    Hepatitis C    Sleep apnea     Past Surgical History:  Procedure Laterality Date   AORTIC VALVE REPLACEMENT  03/2017   [facility]   BIOPSY  12/15/2022   Procedure: BIOPSY;  Surgeon: Cindie Carlin POUR, DO;  [facility];  Service: Endoscopy;;   COLONOSCOPY WITH PROPOFOL  N/A 12/15/2022   Procedure: COLONOSCOPY WITH PROPOFOL ;  Surgeon: Cindie Carlin POUR, DO;  [facility];   Service: Endoscopy;  Laterality: N/A;  10:00 am, asa 3   ESOPHAGOGASTRODUODENOSCOPY (EGD) WITH PROPOFOL  N/A 12/15/2022   Procedure: ESOPHAGOGASTRODUODENOSCOPY (EGD) WITH PROPOFOL ;  Surgeon: Cindie Carlin POUR, DO;  [facility];  Service: Endoscopy;  Laterality: N/A;   POLYPECTOMY  12/15/2022   Procedure: POLYPECTOMY;  Surgeon: Cindie Carlin POUR, DO;  [facility];  Service: Endoscopy;;    Family History  Problem Relation Age of Onset   Diabetes Mother    Heart attack Mother    Heart attack Father     Social History   Socioeconomic History   Marital status: Divorced    Spouse name: Not on file   Number of children: Not on file   Years of education: Not on file   Highest education level: Not on file  Occupational History   Not on file  Tobacco Use   Smoking status: Former    Types: Cigarettes   Smokeless tobacco: Never  Vaping Use   Vaping status: Never Used  Substance and Sexual Activity   Alcohol use: Yes    Comment: socially   Drug use: No   Sexual activity: Yes  Other Topics Concern   Not on file  Social History Narrative   Not on file   Social Drivers of Health   Tobacco Use: Medium Risk (09/06/2024)   Patient History  Smoking Tobacco Use: Former    Smokeless Tobacco Use: Never    Passive Exposure: Not on file  Financial Resource Strain: Low Risk (08/22/2023)   Overall Financial Resource Strain (CARDIA)    Difficulty of Paying Living Expenses: Not very hard  Food Insecurity: No Food Insecurity (04/18/2024)   Epic    Worried About Programme Researcher, Broadcasting/film/video in the Last Year: Never true    Ran Out of Food in the Last Year: Never true  Transportation Needs: No Transportation Needs (04/18/2024)   Epic    Lack of Transportation (Medical): No    Lack of Transportation (Non-Medical): No  Physical Activity: Not on file  Stress: Not on file  Social Connections: Not on file  Intimate Partner Violence: Not At Risk (04/18/2024)   Epic    Fear of  Current or Ex-Partner: No    Emotionally Abused: No    Physically Abused: No    Sexually Abused: No  Depression (PHQ2-9): Low Risk (09/06/2024)   Depression (PHQ2-9)    PHQ-2 Score: 0  Recent Concern: Depression (PHQ2-9) - High Risk (08/19/2024)   Depression (PHQ2-9)    PHQ-2 Score: 13  Alcohol Screen: Not on file  Housing: Low Risk (04/18/2024)   Epic    Unable to Pay for Housing in the Last Year: No    Number of Times Moved in the Last Year: 0    Homeless in the Last Year: No  Utilities: Not At Risk (04/18/2024)   Epic    Threatened with loss of utilities: No  Health Literacy: Not on file    Outpatient Medications Prior to Visit  Medication Sig Dispense Refill   ACCU-CHEK GUIDE TEST test strip USE 1 STRIP TO CHECK BLOOD SUGAR IN THE MORNING, AT NOON, AND AT BEDTIME. 100 each 0   Accu-Chek Softclix Lancets lancets USE TO CHECK BLOOD SUGAR IN THE MORNING, AT NOON, AND AT BEDTIME. 100 each 0   acetaminophen  (TYLENOL ) 500 MG tablet Take 500 mg by mouth every 6 (six) hours as needed.     AMBULATORY NON FORMULARY MEDICATION Vacuum erection device. Dispense 1. 1 each 0   amLODipine  (NORVASC ) 10 MG tablet Take 1 tablet by mouth once daily 30 tablet 0   aspirin  EC 81 MG tablet Take 81 mg by mouth daily.     Blood Glucose Monitoring Suppl DEVI 1 each by Does not apply route in the morning, at noon, and at bedtime. May substitute to any manufacturer covered by patient's insurance. 1 each 0   Blood Pressure Monitor DEVI 1 each by Does not apply route daily. 1 each 0   Blood Pressure Monitoring (BLOOD PRESSURE KIT) DEVI Take blood pressure reading once daily 1 each 0   ciclopirox  (PENLAC ) 8 % solution Apply topically at bedtime. Apply over nail and surrounding skin. Apply daily over previous coat. After seven (7) days, may remove with alcohol and continue cycle. 6.6 mL 3   gabapentin  (NEURONTIN ) 600 MG tablet Take 1 tablet (600 mg total) by mouth 3 (three) times daily. 90 tablet 2    ketoconazole (NIZORAL) 2 % cream Apply 1 Application topically 2 (two) times daily.     Lancet Device MISC 1 each by Does not apply route in the morning, at noon, and at bedtime. May substitute to any manufacturer covered by patient's insurance. 1 each 0   losartan  (COZAAR ) 50 MG tablet Take 1 tablet (50 mg total) by mouth daily. 90 tablet 3   metFORMIN  (GLUCOPHAGE ) 500 MG tablet  Take 1 tablet by mouth once daily with breakfast 90 tablet 0   methimazole  (TAPAZOLE ) 5 MG tablet Take 2 tablets (10mg ) daily with breakfast and 1 tablet (5mg ) daily with supper 270 tablet 1   mirtazapine  (REMERON ) 7.5 MG tablet Take 1 tablet (7.5 mg total) by mouth at bedtime. 30 tablet 0   omeprazole  (PRILOSEC) 40 MG capsule Take 1 capsule (40 mg total) by mouth 2 (two) times daily. 60 capsule 11   rosuvastatin  (CRESTOR ) 10 MG tablet Take 1 tablet (10 mg total) by mouth daily. 90 tablet 3   sildenafil  (REVATIO ) 20 MG tablet Take 1 tablet (20 mg total) by mouth daily. 30 tablet 11   sildenafil  (VIAGRA ) 100 MG tablet Take 1 tablet (100 mg total) by mouth as needed for erectile dysfunction. 30 tablet 5   tiZANidine  (ZANAFLEX ) 4 MG tablet TAKE 1 TABLET BY MOUTH EVERY 12 HOURS AS NEEDED FOR MUSCLE SPASM 60 tablet 2   traMADol  (ULTRAM ) 50 MG tablet Take 1 tablet (50 mg total) by mouth daily as needed. 30 tablet 0   nitrofurantoin , macrocrystal-monohydrate, (MACROBID ) 100 MG capsule Take 1 capsule (100 mg total) by mouth 2 (two) times daily. 14 capsule 0   No facility-administered medications prior to visit.    Allergies[1]  Review of Systems  Constitutional:  Positive for appetite change and fatigue. Negative for chills and fever.  HENT:  Negative for congestion and sore throat.   Eyes:  Negative for pain and discharge.  Respiratory:  Negative for cough and shortness of breath.   Cardiovascular:  Negative for chest pain and palpitations.  Gastrointestinal:  Negative for diarrhea, nausea and vomiting.  Endocrine:  Negative for polydipsia and polyuria.  Genitourinary:  Positive for frequency. Negative for dysuria and hematuria.  Musculoskeletal:  Negative for neck pain and neck stiffness.  Skin:  Negative for rash.  Neurological:  Negative for weakness and numbness.  Psychiatric/Behavioral:  Negative for agitation and behavioral problems.        Objective:    Physical Exam Vitals reviewed.  Constitutional:      General: He is not in acute distress.    Appearance: He is not diaphoretic.  HENT:     Head: Normocephalic and atraumatic.  Eyes:     General: No scleral icterus.    Extraocular Movements: Extraocular movements intact.  Cardiovascular:     Rate and Rhythm: Normal rate and regular rhythm.     Heart sounds: Normal heart sounds. No murmur heard. Pulmonary:     Breath sounds: Normal breath sounds. No wheezing or rales.  Abdominal:     Palpations: Abdomen is soft.     Tenderness: There is no abdominal tenderness.  Musculoskeletal:     Cervical back: Neck supple. No tenderness.     Right lower leg: No edema.     Left lower leg: No edema.  Skin:    General: Skin is warm.     Findings: No rash.  Neurological:     General: No focal deficit present.     Mental Status: He is oriented to person, place, and time.  Psychiatric:        Mood and Affect: Mood normal.        Behavior: Behavior normal. Behavior is cooperative.     BP 118/79   Pulse 75   Ht 5' 6 (1.676 m)   Wt 170 lb 12.8 oz (77.5 kg)   SpO2 98%   BMI 27.57 kg/m  Wt Readings from Last 3 Encounters:  09/06/24 170 lb 12.8 oz (77.5 kg)  08/06/24 171 lb 0.6 oz (77.6 kg)  08/02/24 169 lb (76.7 kg)        Assessment & Plan:   Problem List Items Addressed This Visit       Endocrine   Hyperthyroidism   Lab Results  Component Value Date   TSH <0.100 (L) 06/28/2024    Has been taking methimazole  5 mg once daily F/u with Endocrinology        Genitourinary   BPH (benign prostatic hyperplasia)   Urinary  frequency, especially nocturia likely due to BPH Started Flomax  0.4 mg QD Check UA with reflex culture - likely has component of dehydration, needs to maintain at least 64 ounces of fluid intake in a day      Relevant Medications   tamsulosin  (FLOMAX ) 0.4 MG CAPS capsule   Other Relevant Orders   UA/M w/rflx Culture, Routine   Renal cyst - Primary   Had lengthy discussion about recent US  renal showing incidental subcentimeter renal cyst, which is usually benign and is likely not etiology for his urinary symptoms      Relevant Orders   UA/M w/rflx Culture, Routine     Meds ordered this encounter  Medications   tamsulosin  (FLOMAX ) 0.4 MG CAPS capsule    Sig: Take 1 capsule (0.4 mg total) by mouth daily.    Dispense:  30 capsule    Refill:  3     Beckham Capistran MARLA Blanch, MD     [1] No Known Allergies

## 2024-09-06 NOTE — Assessment & Plan Note (Signed)
 Had lengthy discussion about recent US  renal showing incidental subcentimeter renal cyst, which is usually benign and is likely not etiology for his urinary symptoms

## 2024-09-06 NOTE — Patient Instructions (Addendum)
 Please start taking Tamsulosin  as prescribed.  Please maintain at least 64 ounces of fluid intake in a day.

## 2024-09-06 NOTE — Assessment & Plan Note (Signed)
 Urinary frequency, especially nocturia likely due to BPH Started Flomax  0.4 mg QD Check UA with reflex culture - likely has component of dehydration, needs to maintain at least 64 ounces of fluid intake in a day

## 2024-09-06 NOTE — Assessment & Plan Note (Signed)
 Lab Results  Component Value Date   TSH <0.100 (L) 06/28/2024    Has been taking methimazole  5 mg once daily F/u with Endocrinology

## 2024-09-07 LAB — MICROSCOPIC EXAMINATION
Bacteria, UA: NONE SEEN
Casts: NONE SEEN /LPF
Epithelial Cells (non renal): NONE SEEN /HPF (ref 0–10)
WBC, UA: NONE SEEN /HPF (ref 0–5)

## 2024-09-07 LAB — UA/M W/RFLX CULTURE, ROUTINE
Bilirubin, UA: NEGATIVE
Glucose, UA: NEGATIVE
Ketones, UA: NEGATIVE
Leukocytes,UA: NEGATIVE
Nitrite, UA: NEGATIVE
Protein,UA: NEGATIVE
RBC, UA: NEGATIVE
Specific Gravity, UA: 1.015 (ref 1.005–1.030)
Urobilinogen, Ur: 1 mg/dL (ref 0.2–1.0)
pH, UA: 6.5 (ref 5.0–7.5)

## 2024-09-09 ENCOUNTER — Ambulatory Visit: Payer: Self-pay | Admitting: Internal Medicine

## 2024-09-12 ENCOUNTER — Ambulatory Visit: Admitting: Family Medicine

## 2024-09-16 ENCOUNTER — Telehealth: Payer: Self-pay | Admitting: Pharmacist

## 2024-09-16 ENCOUNTER — Telehealth: Admitting: *Deleted

## 2024-09-16 ENCOUNTER — Telehealth: Payer: Self-pay

## 2024-09-16 ENCOUNTER — Encounter: Payer: Self-pay | Admitting: *Deleted

## 2024-09-16 NOTE — Telephone Encounter (Signed)
 Copied from CRM #[id number]. Topic: General - Other >> Sep 16, 2024  9:19 AM Joesph NOVAK wrote: Reason for CRM: returning phone call from population health pharmacist.

## 2024-09-16 NOTE — Progress Notes (Signed)
 Pharmacy Quality Measure Review  This patient is appearing on a report for being at risk of failing the adherence measure for diabetes medications this calendar year.   Medication: metformin  500 mg daily Last fill date: 04/11/24 for 90 day supply  Contacted pharmacy to facilitate refills.  Catie IVAR Centers, PharmD, [facility] Clinical Pharmacist [phone]

## 2024-09-16 NOTE — Patient Instructions (Signed)
 Kirk Oconnor - I am sorry I was unable to reach you today for our scheduled appointment. I work with Del Wilhelmena Lloyd Sola, FNP and am calling to support your healthcare needs. Please contact me at [phone] at your earliest convenience. I look forward to speaking with you soon.   Thank you,  Rosina Forte, BSN RN [facility], [facility] RN Care Manager Direct Dial: [phone]  Fax: [phone]

## 2024-09-23 ENCOUNTER — Other Ambulatory Visit: Payer: Self-pay | Admitting: Internal Medicine

## 2024-09-23 DIAGNOSIS — E119 Type 2 diabetes mellitus without complications: Secondary | ICD-10-CM

## 2024-09-26 ENCOUNTER — Encounter: Payer: Self-pay | Admitting: Gastroenterology

## 2024-09-27 ENCOUNTER — Other Ambulatory Visit: Payer: Self-pay | Admitting: *Deleted

## 2024-09-27 NOTE — Patient Outreach (Signed)
 Complex Care Management   Visit Note  09/27/2024  Name:  Kirk Oconnor MRN: [id number] DOB: 1955-10-10  Situation: Referral received for Complex Care Management related to Diabetes with Complications and HTN I obtained verbal consent from Patient.  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   Arthritis    CAD (coronary artery disease)    Nonobstructive at cardiac catheterization July 2018 - [facility]   Carpal tunnel syndrome    Depression    Diabetes mellitus without complication (HCC)    Essential hypertension    GERD (gastroesophageal reflux disease)    Hepatitis C    Sleep apnea     Assessment: Patient Reported Symptoms:  Cognitive Cognitive Status: No symptoms reported Cognitive/Intellectual Conditions Management [RPT]: None reported or documented in medical history or problem list   Health Maintenance Behaviors: Annual physical exam Healing Pattern: Average Health Facilitated by: Rest, Stress management  Neurological Neurological Review of Symptoms: Numbness Neurological Management Strategies: Routine screening Neurological Self-Management Outcome: 3 (uncertain)  HEENT HEENT Symptoms Reported: No symptoms reported HEENT Management Strategies: Routine screening HEENT Self-Management Outcome: 5 (very good)    Cardiovascular Cardiovascular Symptoms Reported: Lightheadness Does patient have uncontrolled Hypertension?: Yes Is patient checking Blood Pressure at home?: Yes Patient's Recent BP reading at home: 141/76 Cardiovascular Management Strategies: Routine screening, Medication therapy Cardiovascular Self-Management Outcome: 3 (uncertain)  Respiratory Respiratory  Symptoms Reported: Wheezing, Shortness of breath Respiratory Management Strategies: Routine screening Respiratory Self-Management Outcome: 3 (uncertain)  Endocrine Endocrine Symptoms Reported: Increased urination Is patient diabetic?: Yes Is patient checking blood sugars at home?: Yes List most recent blood sugar readings, include date and time of day: 09-27-2024 at 1105 137 Endocrine Self-Management Outcome: 3 (uncertain)  Gastrointestinal Gastrointestinal Symptoms Reported: No symptoms reported Gastrointestinal Self-Management Outcome: 4 (good) Nutrition Risk Screen (CP): No indicators present  Genitourinary Genitourinary Symptoms Reported: Frequency Genitourinary Self-Management Outcome: 3 (uncertain)  Integumentary Integumentary Symptoms Reported: No symptoms reported Skin Management Strategies: Routine screening Skin Self-Management Outcome: 4 (good)  Musculoskeletal Musculoskelatal Symptoms Reviewed: No symptoms reported Musculoskeletal Management Strategies: Routine screening Musculoskeletal Self-Management Outcome: 4 (good) Falls in the past year?: No Number of falls in past year: 1 or less Was there an injury with Fall?: No Fall Risk Category Calculator: 0 Patient Fall Risk Level: Low Fall Risk Patient at Risk for Falls Due to: No Fall Risks Fall risk Follow up: Falls evaluation completed  Psychosocial Psychosocial Symptoms Reported: Sadness - if selected complete PHQ 2-9 Behavioral Management Strategies: Coping strategies Behavioral Health Self-Management Outcome: 3 (uncertain) Major Change/Loss/Stressor/Fears (CP): Medical condition, self Techniques to Cope with Loss/Stress/Change: Diversional activities      09/27/2024    PHQ2-9 Depression Screening   Little interest or pleasure in doing things More than half the days  Feeling down, depressed, or hopeless Not at all  PHQ-2 - Total Score 2  Trouble falling or staying asleep, or sleeping too much Nearly every day   Feeling tired or having little energy Several days  Poor appetite or overeating  Not at all  Feeling bad about yourself - or that you are a failure or have let yourself or your family down Not at all  Trouble concentrating on things, such as reading the newspaper or watching television Not at all  Moving or speaking so slowly that other people could have noticed.  Or the opposite - being so fidgety or restless that you have been moving around a lot more than usual Not at all  Thoughts that you would be better off dead, or hurting yourself in some way Not at all  PHQ2-9 Total Score 6  If you checked off any problems, how difficult have these problems made it for you to do your work, take care of things at home, or get along with other people Not difficult at all  Depression Interventions/Treatment      Today's Vitals   09/27/24 1111  BP: (!) 141/76   Pain Scale: 0-10 Pain Score: 0-No pain  Medications Reviewed Today     Reviewed by Bertrum Rosina HERO, RN (Registered Nurse) on 09/27/24 at 1104  Med List Status: <None>   Medication Order Taking? Sig Documenting Provider Last Dose Status Informant  ACCU-CHEK GUIDE TEST test strip [id number] Yes USE ONE STRIP TO CHECK BLOOD SUGAR IN THE MORNING, AT NOON, AND AT BEDTIME Del Wilhelmena Falter, [place], FNP  Active   Accu-Chek Softclix Lancets lancets [id number] Yes USE TO CHECK BLOOD SUGAR IN THE MORNING, AT NOON, AND AT BEDTIME. Del Orbe Polanco, Hilario, FNP  Active   acetaminophen  (TYLENOL ) 500 MG tablet [id number] Yes Take 500 mg by mouth every 6 (six) hours as needed. [provider]  Active   AMBULATORY NON FORMULARY MEDICATION [id number] Yes Vacuum erection device. Dispense 1. Gerldine Lauraine BROCKS, FNP  Active   amLODipine  (NORVASC ) 10 MG tablet [id number] Yes Take 1 tablet by mouth once daily Del Orbe Polanco, [place], FNP  Active   aspirin  EC 81 MG tablet [id number] Yes Take 81 mg by mouth daily. [provider]  Active Family Member   Blood Glucose Monitoring Suppl DEVI [id number] Yes 1 each by Does not apply route in the morning, at noon, and at bedtime. May substitute to any manufacturer covered by patient's insurance. Tobie Suzzane POUR, MD  Active   Blood Pressure Monitor DEVI [id number] Yes 1 each by Does not apply route daily. Del Wilhelmena Falter Hilario, FNP  Active   Blood Pressure Monitoring (BLOOD PRESSURE KIT) DEVI [id number] Yes Take blood pressure reading once daily Del Orbe Polanco, [place], FNP  Active   ciclopirox  (PENLAC ) 8 % solution [id number] Yes Apply topically at bedtime. Apply over nail and surrounding skin. Apply daily over previous coat. After seven (7) days, may remove with alcohol and continue cycle. Del Wilhelmena Falter, Hilario, FNP  Active   gabapentin  (NEURONTIN ) 600 MG tablet [id number] Yes Take 1 tablet (600 mg total) by mouth 3 (three) times daily. Del Orbe Polanco, [place], FNP  Active   ketoconazole (NIZORAL) 2 % cream [id number] Yes Apply 1 Application topically 2 (two) times daily. [provider]  Active   Lancet Device MISC [id number] Yes 1 each by Does not apply route in the morning, at noon, and at bedtime. May substitute to any manufacturer covered by patient's insurance. Tobie Suzzane POUR, MD  Active   losartan  (COZAAR ) 50 MG tablet [id number] Yes Take 1 tablet (50 mg total) by mouth daily. Alvan Dorn FALCON, MD  Active   metFORMIN  (GLUCOPHAGE ) 500 MG tablet [id number] Yes Take 1 tablet by mouth once daily with breakfast Del Wilhelmena Falter, [place], FNP  Active   methimazole  (TAPAZOLE ) 5 MG tablet [id number] Yes Take 2 tablets (10mg ) daily with  breakfast and 1 tablet (5mg ) daily with supper Nida, Gebreselassie W, MD  Active   mirtazapine  (REMERON ) 7.5 MG tablet [id number] Yes Take 1 tablet (7.5 mg total) by mouth at bedtime. Tobie Suzzane POUR, MD  Active   omeprazole  [facility]) 40 MG capsule [id number] Yes Take 1 capsule (40 mg total) by mouth 2 (two) times daily. Cindie Carlin POUR, DO  Active            Med  Note MAZIE, [facility]   Mon Mar 27, 2023  3:32 PM)    rosuvastatin  (CRESTOR ) 10 MG tablet [id number] Yes Take 1 tablet (10 mg total) by mouth daily. Alvan Dorn FALCON, MD  Active   sildenafil  (REVATIO ) 20 MG tablet [id number] Yes Take 1 tablet (20 mg total) by mouth daily. McKenzie, Belvie CROME, MD  Active   sildenafil  (VIAGRA ) 100 MG tablet [id number] Yes Take 1 tablet (100 mg total) by mouth as needed for erectile dysfunction. McKenzie, Belvie CROME, MD  Active   tamsulosin  (FLOMAX ) 0.4 MG CAPS capsule [id number] Yes Take 1 capsule (0.4 mg total) by mouth daily. Tobie Suzzane POUR, MD  Active   tiZANidine  (ZANAFLEX ) 4 MG tablet [id number] Yes TAKE 1 TABLET BY MOUTH EVERY 12 HOURS AS NEEDED FOR MUSCLE SPASM Del Orbe Polanco, [place], FNP  Active   traMADol  (ULTRAM ) 50 MG tablet [id number] Yes Take 1 tablet (50 mg total) by mouth daily as needed. Del Wilhelmena Lloyd Sola, FNP  Active            Med Note (ROBB, MELANIE A   Mon Oct 23, 2023  4:32 PM) Did not like the way it made him feel            Recommendation:   Continue Current Plan of Care  Follow Up Plan:   Telephone follow-up in 1 month  Rosina Forte, BSN RN [facility], [facility] RN Care Manager Direct Dial: [phone]  Fax: [phone]

## 2024-09-27 NOTE — Patient Instructions (Signed)
 Visit Information  Thank you for taking time to visit with me today. Please don't hesitate to contact me if I can be of assistance to you before our next scheduled appointment.  Your next care management appointment is by telephone on 10-28-2024 at 11:00 am  Telephone follow-up in 1 month  Please call the care guide team at [phone] if you need to cancel, schedule, or reschedule an appointment.   Please call the Suicide and Crisis Lifeline: 988 call the [place]  National Suicide Prevention Lifeline: 564-030-7891 or TTY: 782 360 7478 TTY 323-242-7280) to talk to a trained counselor call 1-800-273-TALK (toll free, 24 hour hotline) if you are experiencing a Mental Health or Behavioral Health Crisis or need someone to talk to.  Rosina Forte, BSN RN [facility], [facility] RN Care Manager Direct Dial: [phone]  Fax: [phone]

## 2024-10-07 ENCOUNTER — Ambulatory Visit: Admitting: Urology

## 2024-10-07 VITALS — BP 130/85

## 2024-10-07 DIAGNOSIS — N419 Inflammatory disease of prostate, unspecified: Secondary | ICD-10-CM

## 2024-10-07 DIAGNOSIS — R351 Nocturia: Secondary | ICD-10-CM | POA: Diagnosis not present

## 2024-10-07 DIAGNOSIS — N529 Male erectile dysfunction, unspecified: Secondary | ICD-10-CM

## 2024-10-07 LAB — URINALYSIS, ROUTINE W REFLEX MICROSCOPIC
Bilirubin, UA: NEGATIVE
Glucose, UA: NEGATIVE
Ketones, UA: NEGATIVE
Leukocytes,UA: NEGATIVE
Nitrite, UA: NEGATIVE
Protein,UA: NEGATIVE
RBC, UA: NEGATIVE
Specific Gravity, UA: 1.01 (ref 1.005–1.030)
Urobilinogen, Ur: 0.2 mg/dL (ref 0.2–1.0)
pH, UA: 7 (ref 5.0–7.5)

## 2024-10-07 MED ORDER — ALFUZOSIN HCL ER 10 MG PO TB24: 10.0000 mg | ORAL_TABLET | Freq: Every day | ORAL | 11 refills | Status: AC

## 2024-10-07 MED ORDER — DOXYCYCLINE HYCLATE 100 MG PO CAPS: 100.0000 mg | ORAL_CAPSULE | Freq: Two times a day (BID) | ORAL | 0 refills | Status: AC

## 2024-10-07 NOTE — Progress Notes (Unsigned)
   Patient can void prior to the bladder scan. Bladder scan result: 20  Performed By: [facility] LPN

## 2024-10-07 NOTE — Patient Instructions (Signed)
 Prostatitis  Prostatitis is swelling of the prostate gland, also called the prostate. This gland is about 1.5 inches wide and 1 inch high, and it helps to make a fluid called semen. The prostate is below a man's bladder, in front of the butt (rectum). There are different types of prostatitis. What are the causes? One type of prostatitis is caused by an infection from germs (bacteria). Another type is not caused by germs. It may be caused by: Things having to do with the nervous system. This system includes thebrain, spinal cord, and nerves. An autoimmune response. This happens when the body's disease-fighting system attacks healthy tissue in the body by mistake. Psychological factors. These have to do with how the mind works. The causes of other types of prostatitis are normally not known. What are the signs or symptoms? Symptoms of this condition depend on the type of prostatitis you have. If your condition is caused by germs: You may feel pain or burning when you pee (urinate). You may pee often and all of a sudden. You may have problems starting to pee. You may have trouble emptying your bladder when you pee. You may have fever or chills. You may feel pain in your muscles, joints, low back, or lower belly. If you have other types of prostatitis: You may pee often or all of a sudden. You may have trouble starting to pee. You may have a weak flow when you pee. You may leak pee after using the bathroom. You may have other problems, such as: Abnormal fluid coming from the penis. Pain in the testicles or penis. Pain between the butt and the testicles. Pain when fluid comes out of the penis during sex. How is this treated? Treatment for this condition depends on the type of prostatitis. Treatment may include: Medicines. These may treat pain or swelling, or they may help relax muscles. Exercises to help you move better or get stronger (physical therapy). Heat therapy. Techniques to help  you control some of the ways that your body works. Exercises to help you relax. Antibiotic medicine, if your condition is caused by germs. Warm water baths (sitz baths) to relax muscles. Follow these instructions at home: Medicines Take over-the-counter and prescription medicines only as told by your doctor. If you were prescribed an antibiotic medicine, take it as told by your doctor. Do not stop using the antibiotic even if you start to feel better. Managing pain and swelling  Take sitz baths as told by your doctor. For a sitz bath, sit in warm water that is deep enough to cover your hips and butt. If told, put heat on the painful area. Do this as often as told by your doctor. Use the heat source that your doctor recommends, such as a moist heat pack or a heating pad. Place a towel between your skin and the heat source. Leave the heat on for 20-30 minutes. Take off the heat if your skin turns bright red. This is very important if you are unable to feel pain, heat, or cold. You may have a greater risk of getting burned. General instructions Do exercises as told by your doctor, if your doctor prescribed them. Keep all follow-up visits as told by your doctor. This is important. Where to find more information [employer] of Diabetes and Digestive and Kidney Diseases: [email or web address] Contact a doctor if: Your symptoms get worse. You have a fever. Get help right away if: You have chills. You feel light-headed. You feel like you may  faint. You cannot pee. You have blood or clumps of blood (blood clots) in your pee. Summary Prostatitis is swelling of the prostate gland. There are different types of prostatitis. Treatment depends on the type that you have. Take over-the-counter and prescription medicines only as told by your doctor. Get help right away of you have chills, feel light-headed, or feel like you may faint. Also get help right away if you cannot pee or you have  blood or clumps of blood in your pee. This information is not intended to replace advice given to you by your health care provider. Make sure you discuss any questions you have with your health care provider. Document Revised: 07/28/2022 Document Reviewed: 07/28/2022 Elsevier Patient Education  2024 [employer].

## 2024-10-07 NOTE — Progress Notes (Unsigned)
 "  10/07/2024 2:54 PM   Kirk Oconnor Blare Jan 02, 1956 [id number]  Referring provider: Terry Wilhelmena Lloyd Hilario, FNP [phone] [address] [place],  [place] [address]  nocturia   HPI: [name] is a 35bn here for followup for erectile dysfunction and for evaluation of new onset nocturia. 4 weeks ago he started having nocturia 10x. Urine stream is strong. No straining to urinate. He does have urinary urgency but no urge incontinence. PVR 20cc. He is having intermittent dysuria. He has urinary hesitancy. 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   Arthritis    CAD (coronary artery disease)    Nonobstructive at cardiac catheterization July 2018 - [facility]   Carpal tunnel syndrome    Depression    Diabetes mellitus without complication (HCC)    Essential hypertension    GERD (gastroesophageal reflux disease)    Hepatitis C    Sleep apnea     Surgical History: Past Surgical History:  Procedure Laterality Date   AORTIC VALVE REPLACEMENT  03/2017   [facility]   BIOPSY  12/15/2022   Procedure: BIOPSY;  Surgeon: Cindie Carlin POUR, DO;  [facility];  Service: Endoscopy;;   COLONOSCOPY WITH PROPOFOL  N/A 12/15/2022   Procedure: COLONOSCOPY WITH PROPOFOL ;  Surgeon: Cindie Carlin POUR, DO;  [facility];  Service: Endoscopy;  Laterality: N/A;  10:00 am, asa 3   ESOPHAGOGASTRODUODENOSCOPY (EGD) WITH PROPOFOL  N/A 12/15/2022   Procedure: ESOPHAGOGASTRODUODENOSCOPY (EGD) WITH PROPOFOL ;  Surgeon: Cindie Carlin POUR, DO;  [facility];  Service: Endoscopy;  Laterality: N/A;   POLYPECTOMY  12/15/2022   Procedure: POLYPECTOMY;  Surgeon: Cindie Carlin POUR, DO;  [facility];  Service: Endoscopy;;    Home Medications:  Allergies as of 10/07/2024   No Known Allergies      Medication List        Accurate as of October 07, 2024  2:54 PM. If you have any questions, ask your nurse or doctor.          Accu-Chek Guide Test test strip Generic drug: glucose blood USE ONE STRIP TO CHECK BLOOD SUGAR IN THE MORNING, AT NOON, AND AT BEDTIME   Accu-Chek Softclix Lancets lancets USE TO CHECK BLOOD SUGAR IN THE MORNING, AT NOON, AND AT BEDTIME.   acetaminophen  500 MG tablet Commonly known as: TYLENOL  Take 500 mg by mouth every 6 (six) hours as needed.   AMBULATORY NON FORMULARY MEDICATION Vacuum erection device. Dispense 1.   amLODipine  10 MG tablet Commonly known as: NORVASC  Take 1 tablet by mouth once daily   aspirin  EC 81 MG tablet Take 81 mg by mouth daily.   Blood Glucose Monitoring Suppl Devi 1 each by Does not apply route in the morning, at noon, and at bedtime. May substitute to any manufacturer covered by patient's insurance.   Blood Pressure Kit Devi Take blood pressure reading once daily   Blood Pressure Monitor Devi 1 each by Does not apply route daily.   ciclopirox  8 % solution Commonly known as: PENLAC  Apply topically at bedtime. Apply over nail and surrounding skin. Apply daily over previous coat. After seven (7) days, may remove  with alcohol and continue cycle.   gabapentin  600 MG tablet Commonly known as: NEURONTIN  Take 1 tablet (600 mg total) by mouth 3 (three) times daily.   ketoconazole 2 % cream Commonly known as: NIZORAL Apply 1 Application topically 2 (two) times daily.   Lancet Device Misc 1 each by Does not apply route in the morning, at noon, and at bedtime. May substitute to any manufacturer covered by patient's insurance.   losartan  50 MG tablet Commonly known as: COZAAR  Take 1 tablet (50 mg total) by mouth daily.   metFORMIN  500 MG tablet Commonly known as: GLUCOPHAGE  Take 1 tablet by mouth once  daily with breakfast   methimazole  5 MG tablet Commonly known as: TAPAZOLE  Take 2 tablets (10mg ) daily with breakfast and 1 tablet (5mg ) daily with supper   mirtazapine  7.5 MG tablet Commonly known as: REMERON  Take 1 tablet (7.5 mg total) by mouth at bedtime.   omeprazole  40 MG capsule Commonly known as: PRILOSEC Take 1 capsule (40 mg total) by mouth 2 (two) times daily.   rosuvastatin  10 MG tablet Commonly known as: CRESTOR  Take 1 tablet (10 mg total) by mouth daily.   sildenafil  100 MG tablet Commonly known as: VIAGRA  Take 1 tablet (100 mg total) by mouth as needed for erectile dysfunction.   sildenafil  20 MG tablet Commonly known as: REVATIO  Take 1 tablet (20 mg total) by mouth daily.   tamsulosin  0.4 MG Caps capsule Commonly known as: FLOMAX  Take 1 capsule (0.4 mg total) by mouth daily.   tiZANidine  4 MG tablet Commonly known as: ZANAFLEX  TAKE 1 TABLET BY MOUTH EVERY 12 HOURS AS NEEDED FOR MUSCLE SPASM   traMADol  50 MG tablet Commonly known as: ULTRAM  Take 1 tablet (50 mg total) by mouth daily as needed.        Allergies: Allergies[1]  Family History: Family History  Problem Relation Age of Onset   Diabetes Mother    Heart attack Mother    Heart attack Father     Social History:  reports that he has quit smoking. His smoking use included cigarettes. He has never used smokeless tobacco. He reports current alcohol use. He reports that he does not use drugs.  ROS: All other review of systems were reviewed and are negative except what is noted above in HPI  Physical Exam: BP 130/85   Constitutional:  Alert and oriented, No acute distress. HEENT: [place] AT, moist mucus membranes.  Trachea midline, no masses. Cardiovascular: No clubbing, cyanosis, or edema. Respiratory: Normal respiratory effort, no increased work of breathing. GI: Abdomen is soft, nontender, nondistended, no abdominal masses GU: No CVA tenderness.  Lymph: No cervical or inguinal  lymphadenopathy. Skin: No rashes, bruises or suspicious lesions. Neurologic: Grossly intact, no focal deficits, moving all 4 extremities. Psychiatric: Normal mood and affect.  Laboratory Data: Lab Results  Component Value Date   WBC 9.3 08/06/2024   HGB 15.7 08/06/2024   HCT 47.4 08/06/2024   MCV 87 08/06/2024   PLT 220 08/06/2024    Lab Results  Component Value Date   CREATININE 1.00 08/06/2024    Lab Results  Component Value Date   PSA 1.68 03/11/2013    Lab Results  Component Value Date   TESTOSTERONE  706 06/19/2023    Lab Results  Component Value Date   HGBA1C 6.6 (H) 05/22/2024    Urinalysis    Component Value Date/Time   COLORURINE YELLOW 01/13/2023 1244   APPEARANCEUR Clear 09/06/2024 1010   LABSPEC 1.021 01/13/2023 1244  PHURINE 6.0 01/13/2023 1244   GLUCOSEU Negative 09/06/2024 1010   HGBUR NEGATIVE 01/13/2023 1244   BILIRUBINUR Negative 09/06/2024 1010   KETONESUR NEGATIVE 01/13/2023 1244   PROTEINUR Negative 09/06/2024 1010   PROTEINUR NEGATIVE 01/13/2023 1244   UROBILINOGEN 1.0 11/30/2011 1910   NITRITE Negative 09/06/2024 1010   NITRITE NEGATIVE 01/13/2023 1244   LEUKOCYTESUR Negative 09/06/2024 1010   LEUKOCYTESUR NEGATIVE 01/13/2023 1244    Lab Results  Component Value Date   LABMICR Comment 09/06/2024   WBCUA None seen 09/06/2024   LABEPIT None seen 09/06/2024   BACTERIA None seen 09/06/2024    Pertinent Imaging: *** No results found for this or any previous visit.  No results found for this or any previous visit.  No results found for this or any previous visit.  No results found for this or any previous visit.  Results for orders placed during the hospital encounter of 08/16/24  US  Renal  Narrative EXAM: US  Retroperitoneum Complete, Renal. 08/16/2024 02:14:39 PM  TECHNIQUE: Real-time ultrasonography of the retroperitoneum renal was performed.  COMPARISON: None available  CLINICAL HISTORY: Bilateral flank pain  with nocturia and HTN.  FINDINGS:  FINDINGS: RIGHT KIDNEY/URETER: Right kidney measures 10.6 x 4.5 x 5.3 cm. Calculated volume: 132 ml Normal cortical echogenicity. No hydronephrosis. No calculus. No mass.  LEFT KIDNEY/URETER: Left kidney measures 11.7 x 5.5 x 5.8 cm. Calculated volume 196 ml Normal cortical echogenicity. No hydronephrosis. No calculus. The midportion of the left kidney.  BLADDER: Unremarkable appearance of the bladder. Left ureteral jet is visible. Right ureteral jet is visible.  IMPRESSION: 1. Subcentimeter left renal cyst. No follow-up is recommended. 2. No other focal abnormality  Electronically signed by: Oneil Devonshire MD 08/22/2024 07:07 PM EST RP Workstation: [id number]  No results found for this or any previous visit.  No results found for this or any previous visit.  No results found for this or any previous visit.   Assessment & Plan:    1. Erectile dysfunction, unspecified erectile dysfunction type (Primary) ***  2. Nocturia Uroxatral  10mg  qhs - Urinalysis, Routine w reflex microscopic  3. Prostatitis -doxycycline  100mg  BID for 28 days   No follow-ups on file.  Belvie Clara, MD  [facility] [place]      [1] No Known Allergies  "

## 2024-10-10 ENCOUNTER — Encounter: Payer: Self-pay | Admitting: Urology

## 2024-10-19 ENCOUNTER — Other Ambulatory Visit: Payer: Self-pay | Admitting: Family Medicine

## 2024-10-22 ENCOUNTER — Other Ambulatory Visit: Payer: Self-pay

## 2024-10-22 MED ORDER — METFORMIN HCL 500 MG PO TABS: 500.0000 mg | ORAL_TABLET | Freq: Every day | ORAL | 3 refills | Status: AC

## 2024-10-25 ENCOUNTER — Telehealth: Payer: Self-pay

## 2024-10-25 NOTE — Telephone Encounter (Signed)
 Patient made aware to D/C flomax  and continued Alfuzosin . Voiced understanding

## 2024-10-25 NOTE — Telephone Encounter (Signed)
-----   Message from Belvie Clara, MD sent at 10/15/2024  8:25 AM EST ----- D/c flomax  ----- Message ----- From: Gretta Carlos SAUNDERS, CMA Sent: 10/11/2024   2:53 PM EST To: Belvie LITTIE Clara, MD  Please on which medication need to be D/C due to duplicated therapy. Alfuzosin  or Flomax ?

## 2024-10-28 ENCOUNTER — Other Ambulatory Visit: Payer: Self-pay | Admitting: *Deleted

## 2024-10-28 ENCOUNTER — Telehealth: Payer: Self-pay | Admitting: *Deleted

## 2024-10-28 NOTE — Patient Instructions (Signed)
 Visit Information  Thank you for taking time to visit with me today. Please don't hesitate to contact me if I can be of assistance to you before our next scheduled appointment.  Your next care management appointment is by telephone on 11-25-2024 at 11:00 am  Telephone follow-up in 1 month  Please call the care guide team at [phone] if you need to cancel, schedule, or reschedule an appointment.   Please call the Suicide and Crisis Lifeline: 988 call the [place]  National Suicide Prevention Lifeline: 661-566-1362 or TTY: (845) 023-9985 TTY (956)222-5486) to talk to a trained counselor call 1-800-273-TALK (toll free, 24 hour hotline) if you are experiencing a Mental Health or Behavioral Health Crisis or need someone to talk to.  Rosina Forte, BSN RN [facility], [facility] RN Care Manager Direct Dial: [phone]  Fax: [phone]

## 2024-10-28 NOTE — Patient Outreach (Signed)
 Complex Care Management   Visit Note  10/28/2024  Name:  Kirk Oconnor MRN: [id number] DOB: 1956/03/30  Situation: Referral received for Complex Care Management related to Diabetes with Complications and HTN I obtained verbal consent from Patient.  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   Arthritis    CAD (coronary artery disease)    Nonobstructive at cardiac catheterization July 2018 - [facility]   Carpal tunnel syndrome    Depression    Diabetes mellitus without complication (HCC)    Essential hypertension    GERD (gastroesophageal reflux disease)    Hepatitis C    Sleep apnea     Assessment: Patient Reported Symptoms:  Cognitive Cognitive Status: No symptoms reported Cognitive/Intellectual Conditions Management [RPT]: None reported or documented in medical history or problem list   Health Maintenance Behaviors: Annual physical exam Healing Pattern: Average Health Facilitated by: Rest, Stress management  Neurological Neurological Review of Symptoms: Dizziness, Numbness Neurological Management Strategies: Routine screening Neurological Self-Management Outcome: 3 (uncertain)  HEENT HEENT Symptoms Reported: Tinnitus HEENT Management Strategies: Routine screening HEENT Self-Management Outcome: 3 (uncertain)    Cardiovascular Cardiovascular Symptoms Reported: Dizziness Does patient have uncontrolled Hypertension?: Yes Is patient checking Blood Pressure at home?: Yes Patient's Recent BP reading at home: 126/98 Cardiovascular Management Strategies: Medication therapy, Routine screening  Respiratory Respiratory Symptoms Reported: No symptoms reported    Endocrine  Endocrine Symptoms Reported: Increased urination Is patient diabetic?: Yes Is patient checking blood sugars at home?: Yes List most recent blood sugar readings, include date and time of day: 10-28-2024 cBG 120 Endocrine Self-Management Outcome: 4 (good)  Gastrointestinal Gastrointestinal Symptoms Reported: No symptoms reported      Genitourinary Genitourinary Symptoms Reported: Frequency, Malodorous urine Additional Genitourinary Details: recently starting on uroxatrol - patient states it does not work - will call office tomorrow for alternate recommendations Genitourinary Self-Management Outcome: 4 (good)  Integumentary Integumentary Symptoms Reported: No symptoms reported Skin Management Strategies: Routine screening  Musculoskeletal Musculoskelatal Symptoms Reviewed: Not assessed        Psychosocial Psychosocial Symptoms Reported: Not assessed          10/28/2024    PHQ2-9 Depression Screening   Little interest or pleasure in doing things    Feeling down, depressed, or hopeless    PHQ-2 - Total Score    Trouble falling or staying asleep, or sleeping too much    Feeling tired or having little energy    Poor appetite or overeating     Feeling bad about yourself - or that you are a failure or have let yourself or your family down    Trouble concentrating on things, such as reading the newspaper or watching television    Moving or speaking so slowly that other people could have noticed.  Or the opposite - being so fidgety or restless that you have been moving around a lot more than usual    Thoughts that you would be better off dead, or hurting yourself in some way    PHQ2-9 Total Score    If you checked off any problems, how difficult have these problems made it for you to do your work, take care  of things at home, or get along with other people    Depression Interventions/Treatment      Today's Vitals   10/28/24 1126  BP: (!) 126/98   Pain Scale: 0-10 Pain Score: 0-No  pain  Medications Reviewed Today     Reviewed by Bertrum Rosina HERO, RN (Registered Nurse) on 10/28/24 at 1124  Med List Status: <None>   Medication Order Taking? Sig Documenting Provider Last Dose Status Informant  ACCU-CHEK GUIDE TEST test strip [id number]  USE ONE STRIP TO CHECK BLOOD SUGAR IN THE MORNING, AT NOON, AND AT BEDTIME Del Wilhelmena Falter, [place], FNP  Active   Accu-Chek Softclix Lancets lancets [id number]  USE TO CHECK BLOOD SUGAR IN THE MORNING, AT NOON, AND AT BEDTIME. Del Orbe Polanco, Hilario, FNP  Active   acetaminophen  (TYLENOL ) 500 MG tablet [id number]  Take 500 mg by mouth every 6 (six) hours as needed. [provider]  Active   alfuzosin  (UROXATRAL ) 10 MG 24 hr tablet [id number]  Take 1 tablet (10 mg total) by mouth at bedtime. McKenzie, Belvie CROME, MD  Active   AMBULATORY NON [facility] MEDICATION [id number]  Vacuum erection device. Dispense 1. Gerldine Lauraine BROCKS, FNP  Active   amLODipine  (NORVASC ) 10 MG tablet [id number]  Take 1 tablet by mouth once daily Del Orbe Polanco, [place], FNP  Active   aspirin  EC 81 MG tablet [id number]  Take 81 mg by mouth daily. [provider]  Active Family Member  Blood Glucose Monitoring Suppl DEVI [id number]  1 each by Does not apply route in the morning, at noon, and at bedtime. May substitute to any manufacturer covered by patient's insurance. Tobie Suzzane POUR, MD  Active   Blood Pressure Monitor DEVI [id number]  1 each by Does not apply route daily. Del Wilhelmena Falter Hilario, FNP  Active   Blood Pressure Monitoring (BLOOD PRESSURE KIT) DEVI [id number]  Take blood pressure reading once daily Del Orbe Polanco, [place], FNP  Active   ciclopirox  (PENLAC ) 8 % solution [id number]  Apply topically at bedtime. Apply over nail and surrounding skin. Apply daily over previous coat. After seven (7) days, may remove with alcohol and continue cycle. Del Orbe Polanco, Hilario, FNP  Active   doxycycline  (VIBRAMYCIN ) 100 MG capsule [id number]  Take 1 capsule  (100 mg total) by mouth 2 (two) times daily for 28 days. Sherrilee Belvie CROME, MD  Active   gabapentin  (NEURONTIN ) 600 MG tablet [id number]  TAKE 1 TABLET BY MOUTH THREE TIMES DAILY Del Orbe Polanco, [place], FNP  Active   ketoconazole (NIZORAL) 2 % cream [id number]  Apply 1 Application topically 2 (two) times daily. [provider]  Active   Lancet Device MISC [id number]  1 each by Does not apply route in the morning, at noon, and at bedtime. May substitute to any manufacturer covered by patient's insurance. Tobie Suzzane POUR, MD  Active   losartan  (COZAAR ) 50 MG tablet [id number]  Take 1 tablet (50 mg total) by mouth daily. Alvan Dorn FALCON, MD  Active   metFORMIN  (GLUCOPHAGE ) 500 MG tablet [id number]  Take 1 tablet (500 mg total) by mouth daily with breakfast. Bevely Doffing, FNP  Active   methimazole  (TAPAZOLE ) 5 MG tablet [id number]  Take 2 tablets (10mg ) daily with breakfast and 1 tablet (5mg ) daily with supper Nida, Gebreselassie W, MD  Active   mirtazapine  (REMERON ) 7.5 MG tablet [id number]  Take 1 tablet (7.5 mg total) by mouth at bedtime. Tobie Suzzane POUR, MD  Active   omeprazole  [facility]) 40  MG capsule [id number]  Take 1 capsule (40 mg total) by mouth 2 (two) times daily. Cindie Dunnings K, DO  Expired 09/27/24 2359            Med Note MAZIE, TAMMY   Mon Mar 27, 2023  3:32 PM)    rosuvastatin  (CRESTOR ) 10 MG tablet [id number]  Take 1 tablet (10 mg total) by mouth daily. Alvan Dorn FALCON, MD  Active   sildenafil  (REVATIO ) 20 MG tablet [id number]  Take 1 tablet (20 mg total) by mouth daily. McKenzie, Belvie CROME, MD  Active   sildenafil  (VIAGRA ) 100 MG tablet [id number]  Take 1 tablet (100 mg total) by mouth as needed for erectile dysfunction. McKenzie, Belvie CROME, MD  Active   tiZANidine  (ZANAFLEX ) 4 MG tablet [id number]  TAKE 1 TABLET BY MOUTH EVERY 12 HOURS AS NEEDED FOR MUSCLE SPASM Del Orbe Polanco, [place], FNP  Active   traMADol  (ULTRAM ) 50 MG tablet [id number]  Take 1 tablet (50 mg total)  by mouth daily as needed. Del Wilhelmena Lloyd Sola, FNP  Active            Med Note (ROBB, MELANIE A   Mon Oct 23, 2023  4:32 PM) Did not like the way it made him feel            Recommendation:   Continue Current Plan of Care  Follow Up Plan:   Telephone follow-up in 1 month  Rosina Forte, BSN RN [facility], [facility] RN Care Manager Direct Dial: [phone]  Fax: [phone]

## 2024-10-29 ENCOUNTER — Telehealth: Payer: Self-pay | Admitting: Urology

## 2024-10-29 NOTE — Telephone Encounter (Signed)
 Patient states Alfuzosin  is not working and would like to talk to someone about something else. [phone] or [phone]

## 2024-10-29 NOTE — Telephone Encounter (Signed)
 Tried calling pt. Phone went to voiced mail and mailbox is full.

## 2024-10-31 ENCOUNTER — Other Ambulatory Visit (HOSPITAL_COMMUNITY)
Admission: RE | Admit: 2024-10-31 | Discharge: 2024-10-31 | Disposition: A | Source: Ambulatory Visit | Attending: "Endocrinology | Admitting: "Endocrinology

## 2024-10-31 LAB — CBC WITH DIFFERENTIAL/PLATELET
Abs Immature Granulocytes: 0.01 10*3/uL (ref 0.00–0.07)
Basophils Absolute: 0 10*3/uL (ref 0.0–0.1)
Basophils Relative: 1 %
Eosinophils Absolute: 0.3 10*3/uL (ref 0.0–0.5)
Eosinophils Relative: 4 %
HCT: 43.9 % (ref 39.0–52.0)
Hemoglobin: 14.9 g/dL (ref 13.0–17.0)
Immature Granulocytes: 0 %
Lymphocytes Relative: 36 %
Lymphs Abs: 2.3 10*3/uL (ref 0.7–4.0)
MCH: 28.9 pg (ref 26.0–34.0)
MCHC: 33.9 g/dL (ref 30.0–36.0)
MCV: 85.1 fL (ref 80.0–100.0)
Monocytes Absolute: 0.7 10*3/uL (ref 0.1–1.0)
Monocytes Relative: 11 %
Neutro Abs: 3.1 10*3/uL (ref 1.7–7.7)
Neutrophils Relative %: 48 %
Platelets: 185 10*3/uL (ref 150–400)
RBC: 5.16 MIL/uL (ref 4.22–5.81)
RDW: 14.6 % (ref 11.5–15.5)
WBC: 6.4 10*3/uL (ref 4.0–10.5)
nRBC: 0 % (ref 0.0–0.2)

## 2024-10-31 LAB — TSH: TSH: <0.1 u[IU]/mL — ABNORMAL LOW (ref 0.350–4.500)

## 2024-10-31 LAB — COMPREHENSIVE METABOLIC PANEL WITH GFR
ALT: 31 U/L (ref 0–44)
AST: 25 U/L (ref 15–41)
Albumin: 4.1 g/dL (ref 3.5–5.0)
Alkaline Phosphatase: 86 U/L (ref 38–126)
Anion gap: 13 (ref 5–15)
BUN: 13 mg/dL (ref 8–23)
CO2: 21 mmol/L — ABNORMAL LOW (ref 22–32)
Calcium: 9.7 mg/dL (ref 8.9–10.3)
Chloride: 96 mmol/L — ABNORMAL LOW (ref 98–111)
Creatinine, Ser: 0.81 mg/dL (ref 0.61–1.24)
GFR, Estimated: >60 mL/min
Glucose, Bld: 148 mg/dL — ABNORMAL HIGH (ref 70–99)
Potassium: 4.4 mmol/L (ref 3.5–5.1)
Sodium: 130 mmol/L — ABNORMAL LOW (ref 135–145)
Total Bilirubin: 0.6 mg/dL (ref 0.0–1.2)
Total Protein: 7.4 g/dL (ref 6.5–8.1)

## 2024-10-31 LAB — T4, FREE: Free T4: 2.26 ng/dL — ABNORMAL HIGH (ref 0.80–2.00)

## 2024-10-31 NOTE — Telephone Encounter (Signed)
 Patient called back about medication not working he would like a call back

## 2024-10-31 NOTE — Telephone Encounter (Signed)
 Tried calling pt with no answer and unable to LVM due to mailbox full.

## 2024-11-01 ENCOUNTER — Other Ambulatory Visit: Payer: Self-pay | Admitting: Family Medicine

## 2024-11-01 DIAGNOSIS — E119 Type 2 diabetes mellitus without complications: Secondary | ICD-10-CM

## 2024-11-01 LAB — T3, FREE: T3, Free: 5.2 pg/mL — ABNORMAL HIGH (ref 2.0–4.4)

## 2024-11-01 NOTE — Telephone Encounter (Signed)
 Pt called in today and state's that he is getting up at night but he is voiding about gallon. Pt state's that the Alfuzosin  work the first night he took medication but no improvement since, voiding multiple time at night. Pt state's in the day time he is having a bladder pain from being up all night voiding. Pt's state's medication is not working and want to know if there is another alternative medication he can try. Pt is aware a message will be sent to MD on advisement. Voiced understanding

## 2024-11-04 ENCOUNTER — Ambulatory Visit: Admitting: "Endocrinology

## 2024-11-25 ENCOUNTER — Telehealth: Admitting: *Deleted

## 2025-01-15 ENCOUNTER — Ambulatory Visit: Admitting: Urology
